# Patient Record
Sex: Female | Born: 1955 | Race: Black or African American | Hispanic: No | Marital: Married | Smoking: Never smoker
Health system: Southern US, Community
[De-identification: ages and names within clinical notes are randomized; demographics above are authoritative.]

## PROBLEM LIST (undated history)

## (undated) DIAGNOSIS — N186 End stage renal disease: Secondary | ICD-10-CM

## (undated) DIAGNOSIS — K219 Gastro-esophageal reflux disease without esophagitis: Secondary | ICD-10-CM

## (undated) DIAGNOSIS — I10 Essential (primary) hypertension: Secondary | ICD-10-CM

## (undated) DIAGNOSIS — D759 Disease of blood and blood-forming organs, unspecified: Secondary | ICD-10-CM

## (undated) DIAGNOSIS — Z993 Dependence on wheelchair: Secondary | ICD-10-CM

## (undated) DIAGNOSIS — M109 Gout, unspecified: Secondary | ICD-10-CM

## (undated) DIAGNOSIS — J45909 Unspecified asthma, uncomplicated: Secondary | ICD-10-CM

## (undated) DIAGNOSIS — Z9989 Dependence on other enabling machines and devices: Secondary | ICD-10-CM

## (undated) DIAGNOSIS — D631 Anemia in chronic kidney disease: Secondary | ICD-10-CM

## (undated) DIAGNOSIS — IMO0002 Reserved for concepts with insufficient information to code with codable children: Secondary | ICD-10-CM

## (undated) DIAGNOSIS — N185 Chronic kidney disease, stage 5: Secondary | ICD-10-CM

## (undated) DIAGNOSIS — M1990 Unspecified osteoarthritis, unspecified site: Secondary | ICD-10-CM

## (undated) DIAGNOSIS — M329 Systemic lupus erythematosus, unspecified: Secondary | ICD-10-CM

## (undated) DIAGNOSIS — N189 Chronic kidney disease, unspecified: Secondary | ICD-10-CM

## (undated) DIAGNOSIS — R0600 Dyspnea, unspecified: Secondary | ICD-10-CM

## (undated) DIAGNOSIS — D649 Anemia, unspecified: Secondary | ICD-10-CM

## (undated) DIAGNOSIS — G4730 Sleep apnea, unspecified: Secondary | ICD-10-CM

## (undated) DIAGNOSIS — J189 Pneumonia, unspecified organism: Secondary | ICD-10-CM

## (undated) DIAGNOSIS — N184 Chronic kidney disease, stage 4 (severe): Secondary | ICD-10-CM

## (undated) DIAGNOSIS — Z992 Dependence on renal dialysis: Secondary | ICD-10-CM

## (undated) DIAGNOSIS — G47419 Narcolepsy without cataplexy: Secondary | ICD-10-CM

## (undated) HISTORY — DX: Anemia in chronic kidney disease: D63.1

## (undated) HISTORY — PX: TUBAL LIGATION: SHX77

## (undated) HISTORY — PX: OTHER SURGICAL HISTORY: SHX169

## (undated) HISTORY — DX: Reserved for concepts with insufficient information to code with codable children: IMO0002

## (undated) HISTORY — PX: COLONOSCOPY: SHX174

## (undated) HISTORY — DX: Essential (primary) hypertension: I10

## (undated) HISTORY — DX: Chronic kidney disease, unspecified: N18.9

## (undated) HISTORY — DX: Chronic kidney disease, stage 4 (severe): N18.4

## (undated) HISTORY — DX: Systemic lupus erythematosus, unspecified: M32.9

---

## 2004-01-07 ENCOUNTER — Ambulatory Visit: Payer: Self-pay

## 2006-04-14 ENCOUNTER — Ambulatory Visit: Payer: Self-pay

## 2007-04-04 ENCOUNTER — Ambulatory Visit: Payer: Self-pay

## 2012-12-18 ENCOUNTER — Ambulatory Visit (INDEPENDENT_AMBULATORY_CARE_PROVIDER_SITE_OTHER): Payer: 59 | Admitting: Family Medicine

## 2012-12-18 VITALS — BP 110/68 | HR 79 | Temp 98.7°F | Resp 16 | Ht 63.0 in | Wt 196.0 lb

## 2012-12-18 DIAGNOSIS — R350 Frequency of micturition: Secondary | ICD-10-CM

## 2012-12-18 DIAGNOSIS — N390 Urinary tract infection, site not specified: Secondary | ICD-10-CM

## 2012-12-18 LAB — POCT URINALYSIS DIPSTICK
Bilirubin, UA: NEGATIVE
Glucose, UA: NEGATIVE
Ketones, UA: NEGATIVE
Spec Grav, UA: 1.01
Urobilinogen, UA: 0.2

## 2012-12-18 LAB — POCT UA - MICROSCOPIC ONLY
Crystals, Ur, HPF, POC: NEGATIVE
Mucus, UA: NEGATIVE
RBC, urine, microscopic: NEGATIVE

## 2012-12-18 MED ORDER — CIPROFLOXACIN HCL 500 MG PO TABS: 500.0000 mg | ORAL_TABLET | Freq: Two times a day (BID) | ORAL | Status: DC

## 2012-12-18 NOTE — Patient Instructions (Signed)
Urinary Tract Infection  Urinary tract infections (UTIs) can develop anywhere along your urinary tract. Your urinary tract is your body's drainage system for removing wastes and extra water. Your urinary tract includes two kidneys, two ureters, a bladder, and a urethra. Your kidneys are a pair of bean-shaped organs. Each kidney is about the size of your fist. They are located below your ribs, one on each side of your spine.  CAUSES  Infections are caused by microbes, which are microscopic organisms, including fungi, viruses, and bacteria. These organisms are so small that they can only be seen through a microscope. Bacteria are the microbes that most commonly cause UTIs.  SYMPTOMS   Symptoms of UTIs may vary by age and gender of the patient and by the location of the infection. Symptoms in young women typically include a frequent and intense urge to urinate and a painful, burning feeling in the bladder or urethra during urination. Older women and men are more likely to be tired, shaky, and weak and have muscle aches and abdominal pain. A fever may mean the infection is in your kidneys. Other symptoms of a kidney infection include pain in your back or sides below the ribs, nausea, and vomiting.  DIAGNOSIS  To diagnose a UTI, your caregiver will ask you about your symptoms. Your caregiver also will ask to provide a urine sample. The urine sample will be tested for bacteria and white blood cells. White blood cells are made by your body to help fight infection.  TREATMENT   Typically, UTIs can be treated with medication. Because most UTIs are caused by a bacterial infection, they usually can be treated with the use of antibiotics. The choice of antibiotic and length of treatment depend on your symptoms and the type of bacteria causing your infection.  HOME CARE INSTRUCTIONS   If you were prescribed antibiotics, take them exactly as your caregiver instructs you. Finish the medication even if you feel better after you  have only taken some of the medication.   Drink enough water and fluids to keep your urine clear or pale yellow.   Avoid caffeine, tea, and carbonated beverages. They tend to irritate your bladder.   Empty your bladder often. Avoid holding urine for long periods of time.   Empty your bladder before and after sexual intercourse.   After a bowel movement, women should cleanse from front to back. Use each tissue only once.  SEEK MEDICAL CARE IF:    You have back pain.   You develop a fever.   Your symptoms do not begin to resolve within 3 days.  SEEK IMMEDIATE MEDICAL CARE IF:    You have severe back pain or lower abdominal pain.   You develop chills.   You have nausea or vomiting.   You have continued burning or discomfort with urination.  MAKE SURE YOU:    Understand these instructions.   Will watch your condition.   Will get help right away if you are not doing well or get worse.  Document Released: 10/27/2004 Document Revised: 07/19/2011 Document Reviewed: 02/25/2011  [employer] Patient Information 2014 [employer]

## 2012-12-18 NOTE — Progress Notes (Signed)
Subjective:    Patient ID: ADESIRE SOOTER, female    DOB: 02-28-1955, 57 y.o.   MRN: [id number] This chart was scribed for Wardell Honour, MD by Drue Stager, ED Scribe. This patient was seen in room 11 and the patient's care was started at 6:20 PM . HPI HPI Comments: MALYKA WINNIE is a 57 y.o. female who presents to the [facility] complaining of urinary frequency onset three days ago. Pt states her urine is cloudy with associated pressure. Pt reports usually having an infection once to twice a year. Pt denies emesis or fever. Pt has a hx of lupus and is taking medications currently. Pt is a full time technologist with [employer]    There are no active problems to display for this patient.  Past Medical History  Diagnosis Date   Lupus    Past Surgical History  Procedure Laterality Date   Cesarean section     Tubal ligation     Allergies  Allergen Reactions   Erythromycin    Sulfa Antibiotics    Prior to Admission medications   Medication Sig Start Date End Date Taking? Authorizing Provider  albuterol (PROVENTIL HFA;VENTOLIN HFA) 108 (90 BASE) MCG/ACT inhaler Inhale into the lungs every 6 (six) hours as needed for wheezing or shortness of breath.   Yes Historical Provider, MD  budesonide-formoterol (SYMBICORT) 160-4.5 MCG/ACT inhaler Inhale 2 puffs into the lungs 2 (two) times daily.   Yes Historical Provider, MD  fluticasone (FLONASE) 50 MCG/ACT nasal spray Place into both nostrils daily.   Yes Historical Provider, MD  hydroxychloroquine (PLAQUENIL) 200 MG tablet Take by mouth daily.   Yes Historical Provider, MD  losartan-hydrochlorothiazide (HYZAAR) 100-12.5 MG per tablet Take 1 tablet by mouth daily.   Yes Historical Provider, MD  mycophenolate (CELLCEPT) 500 MG tablet Take by mouth 2 (two) times daily.   Yes Historical Provider, MD  predniSONE (DELTASONE) 5 MG tablet Take 5 mg by mouth daily with breakfast.   Yes Historical Provider,  MD   History   Social History   Marital Status: Married    Spouse Name: N/A    Number of Children: N/A   Years of Education: N/A   Occupational History   Not on file.   Social History Main Topics   Smoking status: Never Smoker    Smokeless tobacco: Not on file   Alcohol Use: Not on file   Drug Use: Not on file   Sexual Activity: Not on file   Other Topics Concern   Not on file   Social History Narrative   No narrative on file     Review of Systems  Constitutional: Negative for fever.  Gastrointestinal: Negative for vomiting.  Genitourinary: Positive for urgency and frequency.       Objective:   Physical Exam  Nursing note and vitals reviewed. Constitutional: She is oriented to person, place, and time. She appears well-developed and well-nourished.  HENT:  Head: Normocephalic.  Eyes: EOM are normal.  Neck: Normal range of motion.  Pulmonary/Chest: Effort normal.  Abdominal: She exhibits no distension.  Musculoskeletal: Normal range of motion.  Neurological: She is alert and oriented to person, place, and time.  Psychiatric: She has a normal mood and affect.   Filed Vitals:   12/18/12 1805  BP: 110/68  Pulse: 79  Temp: 98.7 F (37.1 C)  TempSrc: Oral  Resp: 16  Height: 5\' 3"  (1.6 m)  Weight: 196 lb (88.905 kg)  SpO2: 99%  No CVAT Results for orders placed in visit on 12/18/12  POCT UA - MICROSCOPIC ONLY      Result Value Range   WBC, Ur, HPF, POC tntc     RBC, urine, microscopic neg     Bacteria, U Microscopic 2+     Mucus, UA neg     Epithelial cells, urine per micros 2-3     Crystals, Ur, HPF, POC neg     Casts, Ur, LPF, POC neg     Yeast, UA neg    POCT URINALYSIS DIPSTICK      Result Value Range   Color, UA yellow     Clarity, UA cloudy'     Glucose, UA neg     Bilirubin, UA neg     Ketones, UA neg     Spec Grav, UA 1.010     Blood, UA trace     pH, UA 5.0     Protein, UA trace     Urobilinogen, UA 0.2     Nitrite,  UA neg     Leukocytes, UA large (3+)           Assessment & Plan:  6:24 PM- Discussed treatment plan with pt at bedside. Pt verbalized understanding and agreement with plan.  Urinary frequency - Plan: POCT UA - Microscopic Only, POCT urinalysis dipstick, ciprofloxacin (CIPRO) 500 MG tablet, Urine culture  Signed, Robyn Haber, MD

## 2012-12-21 LAB — URINE CULTURE: Colony Count: >=100000

## 2013-02-20 ENCOUNTER — Ambulatory Visit: Payer: Self-pay | Admitting: Internal Medicine

## 2014-11-03 ENCOUNTER — Other Ambulatory Visit: Payer: Self-pay | Admitting: Gastroenterology

## 2014-11-03 DIAGNOSIS — K921 Melena: Secondary | ICD-10-CM

## 2014-11-10 ENCOUNTER — Ambulatory Visit
Admission: RE | Admit: 2014-11-10 | Discharge: 2014-11-10 | Disposition: A | Discharge status: Home / Self Care | Payer: 59 | Source: Ambulatory Visit | Attending: Gastroenterology | Admitting: Gastroenterology

## 2014-11-10 DIAGNOSIS — K921 Melena: Secondary | ICD-10-CM

## 2014-11-10 MED ORDER — IOPAMIDOL (ISOVUE-300) INJECTION 61%
100.0000 mL | Freq: Once | INTRAVENOUS | Status: AC | PRN
Start: 2014-11-10 — End: 2014-11-10
  Administered 2014-11-10: 100 mL via INTRAVENOUS

## 2014-11-13 ENCOUNTER — Other Ambulatory Visit: Payer: Self-pay | Admitting: Gastroenterology

## 2014-11-13 DIAGNOSIS — K921 Melena: Secondary | ICD-10-CM

## 2014-11-28 ENCOUNTER — Other Ambulatory Visit: Payer: Self-pay | Admitting: Gastroenterology

## 2014-11-28 ENCOUNTER — Ambulatory Visit
Admission: RE | Admit: 2014-11-28 | Discharge: 2014-11-28 | Disposition: A | Discharge status: Home / Self Care | Payer: 59 | Source: Ambulatory Visit | Attending: Gastroenterology | Admitting: Gastroenterology

## 2014-11-28 DIAGNOSIS — K921 Melena: Secondary | ICD-10-CM

## 2014-11-28 MED ORDER — GADOXETATE DISODIUM 0.25 MMOL/ML IV SOLN: 9.0000 mL | Freq: Once | INTRAVENOUS | Status: DC | PRN

## 2015-04-17 ENCOUNTER — Other Ambulatory Visit: Payer: Self-pay | Admitting: Gastroenterology

## 2015-04-17 DIAGNOSIS — D649 Anemia, unspecified: Secondary | ICD-10-CM

## 2015-04-17 DIAGNOSIS — E611 Iron deficiency: Secondary | ICD-10-CM

## 2015-04-17 DIAGNOSIS — K921 Melena: Secondary | ICD-10-CM

## 2015-05-18 ENCOUNTER — Ambulatory Visit
Admission: RE | Admit: 2015-05-18 | Discharge: 2015-05-18 | Disposition: A | Discharge status: Home / Self Care | Payer: 59 | Source: Ambulatory Visit | Attending: Gastroenterology | Admitting: Gastroenterology

## 2015-05-18 DIAGNOSIS — D649 Anemia, unspecified: Secondary | ICD-10-CM

## 2015-05-18 DIAGNOSIS — E611 Iron deficiency: Secondary | ICD-10-CM

## 2015-05-18 DIAGNOSIS — K921 Melena: Secondary | ICD-10-CM

## 2015-11-11 ENCOUNTER — Other Ambulatory Visit (HOSPITAL_COMMUNITY)
Admission: RE | Admit: 2015-11-11 | Discharge: 2015-11-11 | Disposition: A | Discharge status: Home / Self Care | Payer: 59 | Source: Ambulatory Visit | Attending: Family Medicine | Admitting: Family Medicine

## 2015-11-11 ENCOUNTER — Other Ambulatory Visit: Payer: Self-pay | Admitting: Family Medicine

## 2015-11-11 DIAGNOSIS — Z01419 Encounter for gynecological examination (general) (routine) without abnormal findings: Secondary | ICD-10-CM | POA: Insufficient documentation

## 2015-11-12 LAB — CYTOLOGY - PAP

## 2016-06-09 ENCOUNTER — Other Ambulatory Visit: Payer: Self-pay | Admitting: Family Medicine

## 2016-06-09 DIAGNOSIS — N184 Chronic kidney disease, stage 4 (severe): Secondary | ICD-10-CM

## 2016-06-13 ENCOUNTER — Ambulatory Visit
Admission: RE | Admit: 2016-06-13 | Discharge: 2016-06-13 | Disposition: A | Discharge status: Home / Self Care | Payer: 59 | Source: Ambulatory Visit | Attending: Family Medicine | Admitting: Family Medicine

## 2016-06-13 DIAGNOSIS — N184 Chronic kidney disease, stage 4 (severe): Secondary | ICD-10-CM

## 2016-06-21 ENCOUNTER — Encounter: Payer: Self-pay | Admitting: Nephrology

## 2016-07-13 ENCOUNTER — Encounter (HOSPITAL_COMMUNITY): Payer: Self-pay | Admitting: *Deleted

## 2016-07-13 ENCOUNTER — Emergency Department (HOSPITAL_COMMUNITY): Payer: 59

## 2016-07-13 ENCOUNTER — Emergency Department (HOSPITAL_COMMUNITY)
Admission: EM | Admit: 2016-07-13 | Discharge: 2016-07-13 | Disposition: A | Discharge status: Home / Self Care | Payer: 59 | Attending: Emergency Medicine | Admitting: Emergency Medicine

## 2016-07-13 DIAGNOSIS — Z79899 Other long term (current) drug therapy: Secondary | ICD-10-CM | POA: Diagnosis not present

## 2016-07-13 DIAGNOSIS — D649 Anemia, unspecified: Secondary | ICD-10-CM | POA: Diagnosis not present

## 2016-07-13 DIAGNOSIS — N289 Disorder of kidney and ureter, unspecified: Secondary | ICD-10-CM

## 2016-07-13 DIAGNOSIS — R569 Unspecified convulsions: Secondary | ICD-10-CM

## 2016-07-13 DIAGNOSIS — R251 Tremor, unspecified: Secondary | ICD-10-CM | POA: Diagnosis present

## 2016-07-13 DIAGNOSIS — N189 Chronic kidney disease, unspecified: Secondary | ICD-10-CM | POA: Diagnosis not present

## 2016-07-13 DIAGNOSIS — G40209 Localization-related (focal) (partial) symptomatic epilepsy and epileptic syndromes with complex partial seizures, not intractable, without status epilepticus: Secondary | ICD-10-CM | POA: Insufficient documentation

## 2016-07-13 HISTORY — DX: Gout, unspecified: M10.9

## 2016-07-13 LAB — PROTIME-INR
INR: 0.98
PROTHROMBIN TIME: 12.9 s (ref 11.4–15.2)

## 2016-07-13 LAB — COMPREHENSIVE METABOLIC PANEL
ALBUMIN: 3.9 g/dL (ref 3.5–5.0)
ALK PHOS: 52 U/L (ref 38–126)
ALT: 20 U/L (ref 14–54)
ANION GAP: 10 (ref 5–15)
AST: 30 U/L (ref 15–41)
BUN: 50 mg/dL — ABNORMAL HIGH (ref 6–20)
CO2: 21 mmol/L — AB (ref 22–32)
Calcium: 9.6 mg/dL (ref 8.9–10.3)
Chloride: 106 mmol/L (ref 101–111)
Creatinine, Ser: 2.27 mg/dL — ABNORMAL HIGH (ref 0.44–1.00)
GFR calc non Af Amer: 22 mL/min — ABNORMAL LOW (ref >60)
GFR, EST AFRICAN AMERICAN: 26 mL/min — AB (ref >60)
GLUCOSE: 99 mg/dL (ref 65–99)
Potassium: 3.8 mmol/L (ref 3.5–5.1)
Sodium: 137 mmol/L (ref 135–145)
Total Bilirubin: 0.5 mg/dL (ref 0.3–1.2)
Total Protein: 7.2 g/dL (ref 6.5–8.1)

## 2016-07-13 LAB — DIFFERENTIAL
BASOS ABS: 0.1 10*3/uL (ref 0.0–0.1)
Basophils Relative: 1 %
Eosinophils Absolute: 0.3 10*3/uL (ref 0.0–0.7)
Eosinophils Relative: 4 %
LYMPHS ABS: 1.6 10*3/uL (ref 0.7–4.0)
Lymphocytes Relative: 26 %
MONO ABS: 0.4 10*3/uL (ref 0.1–1.0)
MONOS PCT: 7 %
NEUTROS ABS: 3.9 10*3/uL (ref 1.7–7.7)
NEUTROS PCT: 62 %

## 2016-07-13 LAB — CBC
HCT: 34.5 % — ABNORMAL LOW (ref 36.0–46.0)
HEMOGLOBIN: 10.8 g/dL — AB (ref 12.0–15.0)
MCH: 26.7 pg (ref 26.0–34.0)
MCHC: 31.3 g/dL (ref 30.0–36.0)
MCV: 85.4 fL (ref 78.0–100.0)
PLATELETS: 220 10*3/uL (ref 150–400)
RBC: 4.04 MIL/uL (ref 3.87–5.11)
RDW: 17.6 % — ABNORMAL HIGH (ref 11.5–15.5)
WBC: 6.3 10*3/uL (ref 4.0–10.5)

## 2016-07-13 LAB — APTT: APTT: 32 s (ref 24–36)

## 2016-07-13 LAB — I-STAT TROPONIN, ED: Troponin i, poc: 0 ng/mL (ref 0.00–0.08)

## 2016-07-13 MED ORDER — LEVETIRACETAM 500 MG PO TABS: 500.0000 mg | ORAL_TABLET | Freq: Two times a day (BID) | ORAL | 0 refills | Status: DC

## 2016-07-13 MED ORDER — LEVETIRACETAM 500 MG PO TABS
1000.0000 mg | ORAL_TABLET | Freq: Once | ORAL | Status: AC
  Administered 2016-07-13: 1000 mg via ORAL
  Filled 2016-07-13: qty 2

## 2016-07-13 NOTE — ED Triage Notes (Signed)
Pt to ED c/o intermittent L sided headache and tremors in the L arm onset this morning. Pt reports slurred speech at times today. No arm or leg drift at present, no tremors noted at present. Pt has seen PCP and sleep doctor for shaking of the head with no diagnosis.

## 2016-07-13 NOTE — ED Provider Notes (Signed)
[facility] DEPT Provider Note   CSN: [id number] Arrival date & time: 07/13/16  0056  By signing my name below, I, Mayer Masker, attest that this documentation has been prepared under the direction and in the presence of Delora Fuel, MD. Electronically Signed: Mayer Masker, Scribe. 07/13/16. 3:23 AM.  History   Chief Complaint Chief Complaint  Patient presents with  . Headache  . Tremors   The history is provided by the patient. No language interpreter was used.   HPI Comments: Colleen Obrien is a 61 y.o. female who presents to the Emergency Department complaining of intermittent, gradually worsening left-sided head and arm tremors that began 1.5 months ago. She states her tremors last 1 minute and her head turns side to side then resolve. She has associated sleep disturbances and headaches and notes her sleep disturbances have worsened over the past year. She states today her symptoms worsened this evening so she came to the ED. She notes her tremors are worse at night and they improve when she tries to calm herself down. She notes she has difficulty talking during her symptoms but thinks this is due to her head shaking and not due to the inability to come up with words. She denies weakness, numbness/tingling in her arms, tremors in her legs, and difficulty walking. She also denies any HA at this time. She has seen her PCP and sleep physician for this with no results.  Past Medical History:  Diagnosis Date  . Gout   . Lupus     There are no active problems to display for this patient.   Past Surgical History:  Procedure Laterality Date  . CESAREAN SECTION    . TUBAL LIGATION      OB History    No data available       Home Medications    Prior to Admission medications   Medication Sig Start Date End Date Taking? Authorizing Provider  albuterol (PROVENTIL HFA;VENTOLIN HFA) 108 (90 BASE) MCG/ACT inhaler Inhale into the lungs every 6 (six) hours as needed for  wheezing or shortness of breath.    [provider]  budesonide-formoterol (SYMBICORT) 160-4.5 MCG/ACT inhaler Inhale 2 puffs into the lungs 2 (two) times daily.    [provider]  ciprofloxacin (CIPRO) 500 MG tablet Take 1 tablet (500 mg total) by mouth 2 (two) times daily. 12/18/12   Robyn Haber, MD  fluticasone (FLONASE) 50 MCG/ACT nasal spray Place into both nostrils daily.    [provider]  hydroxychloroquine (PLAQUENIL) 200 MG tablet Take by mouth daily.    [provider]  losartan-hydrochlorothiazide (HYZAAR) 100-12.5 MG per tablet Take 1 tablet by mouth daily.    [provider]  mycophenolate (CELLCEPT) 500 MG tablet Take by mouth 2 (two) times daily.    [provider]  predniSONE (DELTASONE) 5 MG tablet Take 5 mg by mouth daily with breakfast.    [provider]    Family History Family History  Problem Relation Age of Onset  . Hypertension Mother   . Diabetes Mother   . Dementia Father   . Hypertension Father   . Hyperlipidemia Father   . Diabetes Paternal Grandmother   . Alcohol abuse Paternal Grandfather     Social History Social History  Substance Use Topics  . Smoking status: Never Smoker  . Smokeless tobacco: Never Used  . Alcohol use No     Allergies   Erythromycin and Sulfa antibiotics   Review of Systems Review of Systems  Musculoskeletal: Negative for gait problem.  Neurological: Positive for tremors and headaches. Negative for weakness and numbness.  Psychiatric/Behavioral: Positive for sleep disturbance.  All other systems reviewed and are negative.    Physical Exam Updated Vital Signs BP 129/86   Pulse 85   Temp 97.7 F (36.5 C) (Oral)   Resp 20   SpO2 97%   Physical Exam  Constitutional: She is oriented to person, place, and time. She appears well-developed and well-nourished.  HENT:  Head: Normocephalic and atraumatic.  Eyes: EOM are normal. Pupils are equal,  round, and reactive to light.  Neck: Normal range of motion. Neck supple. No JVD present.  Cardiovascular: Normal rate, regular rhythm and normal heart sounds.   No murmur heard. Pulmonary/Chest: Effort normal and breath sounds normal. She has no wheezes. She has no rales. She exhibits no tenderness.  Abdominal: Soft. Bowel sounds are normal. She exhibits no distension and no mass. There is no tenderness.  Musculoskeletal: Normal range of motion. She exhibits no edema.  Lymphadenopathy:    She has no cervical adenopathy.  Neurological: She is alert and oriented to person, place, and time. No cranial nerve deficit. She exhibits normal muscle tone. Coordination normal.  No pronator drift Motor strength 5/5 in all tested muscle groups  Skin: Skin is warm and dry. No rash noted.  Psychiatric: She has a normal mood and affect. Her behavior is normal. Judgment and thought content normal.  Nursing note and vitals reviewed.    ED Treatments / Results  DIAGNOSTIC STUDIES: Oxygen Saturation is 97% on RA, normal by my interpretation.    COORDINATION OF CARE: 3:21 AM Discussed treatment plan with pt at bedside and pt agreed to plan.  Labs (all labs ordered are listed, but only abnormal results are displayed) Labs Reviewed  CBC - Abnormal; Notable for the following:       Result Value   Hemoglobin 10.8 (*)    HCT 34.5 (*)    RDW 17.6 (*)    All other components within normal limits  COMPREHENSIVE METABOLIC PANEL - Abnormal; Notable for the following:    CO2 21 (*)    BUN 50 (*)    Creatinine, Ser 2.27 (*)    GFR calc non Af Amer 22 (*)    GFR calc Af Amer 26 (*)    All other components within normal limits  PROTIME-INR  APTT  DIFFERENTIAL  I-STAT TROPOININ, ED    Radiology Ct Head Wo Contrast  Result Date: 07/13/2016 CLINICAL DATA:  Tremor into the left arm with history of lupus EXAM: CT HEAD WITHOUT CONTRAST TECHNIQUE: Contiguous axial images were obtained from the base of the  skull through the vertex without intravenous contrast. COMPARISON:  None. FINDINGS: Brain: No evidence of acute infarction, hemorrhage, hydrocephalus, extra-axial collection or mass lesion/mass effect. Vascular: Minimal carotid artery calcification. No hyperdense vessels Skull: Normal. Negative for fracture or focal lesion. Sinuses/Orbits: Mild mucosal thickening in the sinuses. No acute orbital abnormality. Other: None IMPRESSION: No CT evidence for acute intracranial abnormality. Electronically Signed   By: Donavan Foil M.D.   On: 07/13/2016 01:52   Mr Brain Wo Contrast  Result Date: 07/13/2016 CLINICAL DATA:  Intermittent LEFT headache and LEFT arm tremor beginning this morning. Evaluate seizures. History of lupus. EXAM: MRI HEAD WITHOUT CONTRAST TECHNIQUE: Multiplanar, multiecho pulse sequences of the brain and surrounding structures were obtained without intravenous contrast. Patient was unable to receive contrast due to low GFR. COMPARISON:  CT HEAD July 13, 2016  at 0142 hours FINDINGS: Mild motion degraded examination. BRAIN: No reduced diffusion to suggest acute ischemia. No susceptibility artifact to suggest hemorrhage. The ventricles and sulci are normal for patient's age. A few punctate supratentorial white matter FLAIR T2 hyperintensities, less than expected for age. 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 No suspicious parenchymal signal, masses or mass effect. No abnormal extra-axial fluid collections. VASCULAR: Normal major intracranial vascular flow voids present at skull base. SKULL AND UPPER CERVICAL SPINE: No abnormal sellar expansion. No suspicious calvarial bone marrow signal. Craniocervical junction maintained. SINUSES/ORBITS: The mastoid air-cells and included paranasal sinuses are well-aerated. The included ocular globes and orbital contents are non-suspicious. OTHER: None. IMPRESSION: No acute intracranial process on this mildly motion degraded examination. 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  Minimal chronic small vessel ischemic disease. Electronically Signed   By: Elon Alas M.D.   On: 07/13/2016 05:32    Procedures Procedures (including critical care time)  Medications Ordered in ED Medications  levETIRAcetam (KEPPRA) tablet 1,000 mg (1,000 mg Oral Given 07/13/16 0350)     Initial Impression / Assessment and Plan / ED Course  I have reviewed the triage vital signs and the nursing notes.  Pertinent labs & imaging results that were available during my care of the patient were reviewed by me and considered in my medical decision making (see chart for details).  Episodes of head shaking with at least one episode showing involvement of the left arm. This is strongly suggestive of focal seizure with beginnings of [place] March. She is given initial dose of levetiracetam. CT had been ordered prior to my seeing her and was unremarkable. Screening labs showed mild anemia which is unchanged from baseline, and renal insufficiency which is also unchanged from baseline. She sent for MRI which shows evidence of tiny cerebellar infarcts, but no acute process. Otherwise she is discharged with prescription for levetiracetam and is referred to neurology for an EEG.  Final Clinical Impressions(s) / ED Diagnoses   Final diagnoses:  Focal seizure ([place])  Renal insufficiency  Normochromic normocytic anemia    New Prescriptions New Prescriptions   LEVETIRACETAM (KEPPRA) 500 MG TABLET    Take 1 tablet (500 mg total) by mouth 2 (two) times daily.   I personally performed the services described in this documentation, which was scribed in my presence. The recorded information has been reviewed and is accurate.        Delora Fuel, MD 94/58/59 [id number]

## 2016-07-13 NOTE — Discharge Instructions (Signed)
I believe the shaking episodes that you have been having are focal seizures. These are when small areas of the brain are involved in seizures rather than the whole brain. Please make an appointment with the neurologist for further outpatient workup.

## 2016-07-13 NOTE — ED Notes (Signed)
Patient transported to MRI 

## 2016-08-09 ENCOUNTER — Telehealth: Payer: Self-pay | Admitting: *Deleted

## 2016-08-09 NOTE — Telephone Encounter (Signed)
Pt Reed form on [employer].

## 2016-08-09 NOTE — Telephone Encounter (Signed)
She has new patient appointment pending on 08/23/16.  The form will be saved until she has established care with our office.

## 2016-08-23 ENCOUNTER — Encounter: Payer: Self-pay | Admitting: Neurology

## 2016-08-23 ENCOUNTER — Ambulatory Visit (INDEPENDENT_AMBULATORY_CARE_PROVIDER_SITE_OTHER): Payer: 59 | Admitting: Neurology

## 2016-08-23 DIAGNOSIS — G4719 Other hypersomnia: Secondary | ICD-10-CM | POA: Insufficient documentation

## 2016-08-23 DIAGNOSIS — G4733 Obstructive sleep apnea (adult) (pediatric): Secondary | ICD-10-CM | POA: Diagnosis not present

## 2016-08-23 DIAGNOSIS — R251 Tremor, unspecified: Secondary | ICD-10-CM

## 2016-08-23 MED ORDER — DIVALPROEX SODIUM ER 500 MG PO TB24: 500.0000 mg | ORAL_TABLET | Freq: Every day | ORAL | 11 refills | Status: DC

## 2016-08-23 NOTE — Progress Notes (Signed)
PATIENT: Colleen Obrien DOB: [birth year]  Chief Complaint  Patient presents with  . Seizures    She is here with her husband, Jeneen Rinks.  She presented to the ED on 07/13/16 with three episodes of seizure-like activity in the same day (head shaking, arm shaking, decreased focus lasting several minutes each).  Reports similar events prior to this date.    . Sleep Disturbance    She has difficulty getting to sleep and has vivid dreams causing her fatigue during the day.  She was just provided a prescription of modafinil 200mg  daily by her PCP but has not started the medication yet.  Marland Kitchen PCP    Carol Ada, MD - referred from ED.     HISTORICAL  Colleen Obrien is a 61 years old right-handed female, accompanied by her husband, seen in refer by her primary care physician Dr. Tamala Julian, [facility] for evaluation of seizure-like activity, sleep disturbance,  She is currently a patient of Dr. Maxwell Caul, carry a diagnosis of obstructive sleep apnea since 2008, for a while, CPAP machine has been very helpful, but she has been noncompliant with her CPAP machine for a while, since 2014, she had worsening daytime sleepiness, snoring at nighttime, frequent awakening, had repeat study, began to use CPAP on a daily basis since 2014, which seems to help her sleep overall until 2017  She also carried a diagnosis of lupus, she presented with multiple joint pain, swelling, in 1982, over the years, she was treated with high tapering dose of prednisone, currently maintained on Plaquenil 500 mg twice a day, CellCept 500 mg twice a day, she also suffered nephritis,  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, reading under microscope, since 2017, she noticed increased daytime sleepiness despite adequate hours in bed every night, she woke up feel un-refreshed, gradually worsened, sometimes she fell off sleep besides her microscope, if she sits as a passenger, as soon as her car was pulled out of the  driveway, she began falling to sleep,She also complains about vivid Jeneen Rinks, even while she was waking up from sleep, her dream seems to continue   She also described if she laugh heartily, she felt generalized weakness,  She began to experience seizure-like activity on July 13 2016, involuntary had shaking movement, without loss of consciousness, no she was taken to the emergency room,  I personally reviewed MRI of the brain on July 13 2016: No acute abnormality, 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  Laboratory evaluation showed elevated creatinine 2.27, estimated GFR 22, CBC showed hemoglobin of 10.8,  She was not able to go back to work since her emergency presentation on July 13 2016, despite resting at home, she continue complains of generalized weakness, fatigue, sleepiness, lack of stamina,  She was given a prescription of modafinil 200 mg by Dr. Maxwell Caul couple weeks ago, has not taken it yet  REVIEW OF SYSTEMS: Full 14 system review of systems performed and notable only for as above.  ALLERGIES: Allergies  Allergen Reactions  . Erythromycin   . Levofloxacin     Causes muscle pain   . Sulfa Antibiotics     HOME MEDICATIONS: Current Outpatient Prescriptions  Medication Sig Dispense Refill  . albuterol (PROVENTIL HFA;VENTOLIN HFA) 108 (90 BASE) MCG/ACT inhaler Inhale into the lungs every 6 (six) hours as needed for wheezing or shortness of breath.    . budesonide-formoterol (SYMBICORT) 160-4.5 MCG/ACT inhaler Inhale 2 puffs into the lungs 2 (two) times  daily.    . denosumab (PROLIA) 60 MG/ML SOLN injection Inject 60 mg into the skin every 6 (six) months. Administer in upper arm, thigh, or abdomen    . fluticasone (FLONASE) 50 MCG/ACT nasal spray Place into both nostrils daily.    . hydroxychloroquine (PLAQUENIL) 200 MG tablet Take by mouth daily.    Marland Kitchen levETIRAcetam (KEPPRA) 500 MG tablet Take 1 tablet (500 mg total) by mouth 2 (two) times  daily. 60 tablet 0  . losartan-hydrochlorothiazide (HYZAAR) 100-12.5 MG per tablet Take 1 tablet by mouth daily.    . modafinil (PROVIGIL) 200 MG tablet Take 200 mg by mouth daily.    . mycophenolate (CELLCEPT) 500 MG tablet Take by mouth 2 (two) times daily.    . Polyvinyl Alcohol-Povidone PF (REFRESH) 1.4-0.6 % SOLN Apply to eye daily.     No current facility-administered medications for this visit.     PAST MEDICAL HISTORY: Past Medical History:  Diagnosis Date  . Gout   . Hypertension   . Lupus     PAST SURGICAL HISTORY: Past Surgical History:  Procedure Laterality Date  . CESAREAN SECTION    . TUBAL LIGATION      FAMILY HISTORY: Family History  Problem Relation Age of Onset  . Hypertension Mother   . Diabetes Mother   . Dementia Father   . Hypertension Father   . Hyperlipidemia Father   . Diabetes Paternal Grandmother   . Alcohol abuse Paternal Grandfather     SOCIAL HISTORY:  Social History   Social History  . Marital status: Married    Spouse name: N/A  . Number of children: 2  . Years of education: College   Occupational History  . Cytotechnologists    Social History Main Topics  . Smoking status: Never Smoker  . Smokeless tobacco: Never Used  . Alcohol use No  . Drug use: No  . Sexual activity: Not on file   Other Topics Concern  . Not on file   Social History Narrative   Lives at home with her husband.   Right-handed.   2-3 cups caffeine daily.     PHYSICAL EXAM   Vitals:   08/23/16 1005  BP: 128/79  Pulse: 92  Weight: 199 lb (90.3 kg)  Height: 5\' 2"  (1.575 m)    Not recorded      Body mass index is 36.4 kg/m.  PHYSICAL EXAMNIATION:  Gen: NAD, conversant, well nourised, obese, well groomed                     Cardiovascular: Regular rate rhythm, no peripheral edema, warm, nontender. Eyes: Conjunctivae clear without exudates or hemorrhage Neck: Supple, no carotid bruits. Pulmonary: Clear to auscultation bilaterally    NEUROLOGICAL EXAM:  MENTAL STATUS: Speech:    Speech is normal; fluent and spontaneous with normal comprehension.  Cognition:     Orientation to time, place and person     Normal recent and remote memory     Normal Attention span and concentration     Normal Language, naming, repeating,spontaneous speech     Fund of knowledge   CRANIAL NERVES: CN II: Visual fields are full to confrontation. Fundoscopic exam is normal with sharp discs and no vascular changes. Pupils are round equal and briskly reactive to light. CN III, IV, VI: extraocular movement are normal. No ptosis. CN V: Facial sensation is intact to pinprick in all 3 divisions bilaterally. Corneal responses are intact.  CN VII: Face is symmetric  with normal eye closure and smile. CN VIII: Hearing is normal to rubbing fingers CN IX, X: Palate elevates symmetrically. Phonation is normal. CN XI: Head turning and shoulder shrug are intact CN XII: Tongue is midline with normal movements and no atrophy.  MOTOR: There is no pronator drift of out-stretched arms. Muscle bulk and tone are normal. Muscle strength is normal.  REFLEXES: Reflexes are 2+ and symmetric at the biceps, triceps, knees, and ankles. Plantar responses are flexor.  SENSORY: Intact to light touch, pinprick, positional sensation and vibratory sensation are intact in fingers and toes.  COORDINATION: Rapid alternating movements and fine finger movements are intact. There is no dysmetria on finger-to-nose and heel-knee-shin.    GAIT/STANCE: Posture is normal. Gait is steady with normal steps, base, arm swing, and turning. Heel and toe walking are normal. Tandem gait is normal.  Romberg is absent.   DIAGNOSTIC DATA (LABS, IMAGING, TESTING) - I reviewed patient records, labs, notes, testing and imaging myself where available.   ASSESSMENT AND PLAN  Derinda Bartus Obrien is a 61 y.o. female   Excessive daytime sleepiness  History of obstructive sleep  apnea, current change also suggestive of cataplexy  Modafinil 200 mg every morning  Seizure-like activity:  Depakote ER 500 mg every night  EEG  Lab   Marcial Pacas, M.D. Ph.D.  [facility] [address], [place],  [address] Ph: [phone] Fax: [phone]  HT:DSKAJ, Hal Hope, MD

## 2016-08-25 ENCOUNTER — Encounter: Payer: Self-pay | Admitting: *Deleted

## 2016-08-25 LAB — VITAMIN B12: Vitamin B-12: 702 pg/mL (ref 232–1245)

## 2016-08-25 LAB — ANA W/REFLEX: ANA: NEGATIVE

## 2016-08-25 LAB — CK: Total CK: 167 U/L (ref 24–173)

## 2016-08-25 LAB — C-REACTIVE PROTEIN: CRP: 2.9 mg/L (ref 0.0–4.9)

## 2016-08-25 LAB — COPPER, SERUM: COPPER: 112 ug/dL (ref 72–166)

## 2016-08-25 LAB — FERRITIN: FERRITIN: 57 ng/mL (ref 15–150)

## 2016-08-25 LAB — HGB A1C W/O EAG: HEMOGLOBIN A1C: 5.4 % (ref 4.8–5.6)

## 2016-08-25 LAB — RPR: RPR Ser Ql: NONREACTIVE

## 2016-08-25 LAB — VITAMIN D 25 HYDROXY (VIT D DEFICIENCY, FRACTURES): VIT D 25 HYDROXY: 22.4 ng/mL — AB (ref 30.0–100.0)

## 2016-08-25 LAB — TSH: TSH: 2.19 u[IU]/mL (ref 0.450–4.500)

## 2016-09-05 ENCOUNTER — Ambulatory Visit (INDEPENDENT_AMBULATORY_CARE_PROVIDER_SITE_OTHER): Payer: 59 | Admitting: Neurology

## 2016-09-05 DIAGNOSIS — R251 Tremor, unspecified: Secondary | ICD-10-CM

## 2016-09-05 DIAGNOSIS — G4719 Other hypersomnia: Secondary | ICD-10-CM

## 2016-09-05 DIAGNOSIS — G4733 Obstructive sleep apnea (adult) (pediatric): Secondary | ICD-10-CM

## 2016-09-08 ENCOUNTER — Telehealth: Payer: Self-pay | Admitting: Neurology

## 2016-09-08 NOTE — Telephone Encounter (Signed)
Please call patient EEG was normal, I will refer her to sleep study, please put her on schedule for Dr. Brett Fairy or Dr. Rexene Alberts as soon as possible,  Also check to see how she tolerated Depakote

## 2016-09-08 NOTE — Telephone Encounter (Signed)
Pt called the office requesting EEG results. She also wants to know how long she will stay on divalproex (DEPAKOTE ER) 500 MG 24 hr tablet. She is having vivid dreams, talking in her sleep. Please call

## 2016-09-08 NOTE — Telephone Encounter (Signed)
Spoke to pt and she has been taking the depakote er 500mg  daily.  She has noted a slight tremor in her mouth previously which she says has worsened.  Also is having vivid dreams again since starting the depakote.  Please advise.  Had EEG done on Monday, inquiring of results too.

## 2016-09-08 NOTE — Procedures (Signed)
   HISTORY: 61 years old presented with seizure-like episode  TECHNIQUE:  16 channel EEG was performed based on standard 10-16 international system. One channel was dedicated to EKG, which has demonstrates normal sinus rhythm of 84 beats per minutes.  Upon awakening, the posterior background activity was well-developed, in alpha range, 8 Hz, reactive to eye opening and closure.  There was no evidence of epileptiform discharge. There was frequent electrode artifact  Photic stimulation was performed, which induced a symmetric photic driving.  Hyperventilation was not performed, there was no abnormality elicit.  No sleep was achieved.  CONCLUSION: This is a  normal awake EEG.  There is no electrodiagnostic evidence of epileptiform discharge.  Marcial Pacas, M.D. Ph.D.  [facility] [place], [place] [address] Phone: [phone] Fax:      [phone]

## 2016-09-09 NOTE — Telephone Encounter (Signed)
Pt returned RN's call °

## 2016-09-09 NOTE — Telephone Encounter (Signed)
Spoke to pt and she is currently on cpap for osa and modafinil for hypersomnia.  Pt states that she is taking modafinil and then will have to sleep for one hour and then is ok after this.  I told since depakote not helping to stop.  We will get records from Dr. Maxwell Caul to review. (hold ss).   May move up appt once reviewed notes from Dr. Maxwell Caul.  Pt verbalized understanding. I lmvm for Lauren at [facility] [phone] to fax sleep notes to [name] at [phone].  She was not in today. So this may happen on Monday.  She is to call if questions (lauren).

## 2016-09-09 NOTE — Telephone Encounter (Signed)
LMVM for pt that returning call for results and plan of care.  Per Dr. Krista Blue ok to stop depakote.  Needs sleep study.

## 2016-09-12 ENCOUNTER — Telehealth: Payer: Self-pay | Admitting: *Deleted

## 2016-09-12 NOTE — Telephone Encounter (Signed)
-----   Message from Marcial Pacas, MD sent at 09/09/2016  9:06 AM EDT ----- Please call patient for normal eeg

## 2016-09-12 NOTE — Telephone Encounter (Signed)
Received ofv note from Dr. Nancee Liter office.

## 2016-09-12 NOTE — Telephone Encounter (Signed)
I spoke to pt and relayed that her EEG was normal study.  (no seizures).   She verbalized understanding.

## 2016-09-12 NOTE — Telephone Encounter (Addendum)
I gave EEG results to pt. (normal study).  I told her that I had contacted, LMVM for Lauren at Dr. Nancee Liter office last week and again this am for notes/ sleep to fax to [name] (gave her differenct fax #)/  .  I told her that since he did not refer to [name], she may need to call and LM as well for [name] to get records.  She stated she would.

## 2016-09-19 NOTE — Telephone Encounter (Signed)
I was able to review sleep study report from Dr. Maxwell Caul, she had moderate obstructive sleep apnea, not compliant with her CPAP machine, complains of poor sleep quality, excessive daytime sleepiness, fatigue,

## 2016-11-01 ENCOUNTER — Ambulatory Visit: Payer: 59 | Admitting: Neurology

## 2016-11-01 ENCOUNTER — Telehealth: Payer: Self-pay | Admitting: *Deleted

## 2016-11-01 NOTE — Telephone Encounter (Signed)
No showed follow up appointment. 

## 2016-11-02 ENCOUNTER — Encounter: Payer: Self-pay | Admitting: Neurology

## 2016-11-08 ENCOUNTER — Encounter (HOSPITAL_BASED_OUTPATIENT_CLINIC_OR_DEPARTMENT_OTHER): Payer: Self-pay

## 2016-11-08 DIAGNOSIS — G4719 Other hypersomnia: Secondary | ICD-10-CM

## 2016-12-05 ENCOUNTER — Ambulatory Visit (HOSPITAL_BASED_OUTPATIENT_CLINIC_OR_DEPARTMENT_OTHER): Payer: 59 | Admitting: Internal Medicine

## 2016-12-05 VITALS — Ht 62.0 in | Wt 199.0 lb

## 2016-12-06 ENCOUNTER — Encounter (HOSPITAL_BASED_OUTPATIENT_CLINIC_OR_DEPARTMENT_OTHER): Payer: 59

## 2016-12-27 ENCOUNTER — Ambulatory Visit (HOSPITAL_BASED_OUTPATIENT_CLINIC_OR_DEPARTMENT_OTHER): Payer: 59 | Attending: Internal Medicine | Admitting: Internal Medicine

## 2016-12-27 DIAGNOSIS — G4733 Obstructive sleep apnea (adult) (pediatric): Secondary | ICD-10-CM | POA: Insufficient documentation

## 2016-12-27 DIAGNOSIS — G4719 Other hypersomnia: Secondary | ICD-10-CM

## 2016-12-27 DIAGNOSIS — G4710 Hypersomnia, unspecified: Secondary | ICD-10-CM | POA: Insufficient documentation

## 2016-12-28 ENCOUNTER — Ambulatory Visit (HOSPITAL_BASED_OUTPATIENT_CLINIC_OR_DEPARTMENT_OTHER): Payer: 59 | Attending: Internal Medicine | Admitting: Internal Medicine

## 2016-12-28 DIAGNOSIS — G47419 Narcolepsy without cataplexy: Secondary | ICD-10-CM | POA: Insufficient documentation

## 2016-12-28 DIAGNOSIS — G4719 Other hypersomnia: Secondary | ICD-10-CM | POA: Diagnosis not present

## 2016-12-29 ENCOUNTER — Encounter (HOSPITAL_BASED_OUTPATIENT_CLINIC_OR_DEPARTMENT_OTHER): Payer: Self-pay | Admitting: *Deleted

## 2016-12-29 ENCOUNTER — Encounter (HOSPITAL_BASED_OUTPATIENT_CLINIC_OR_DEPARTMENT_OTHER): Payer: Self-pay | Admitting: Internal Medicine

## 2017-01-01 NOTE — Procedures (Signed)
   NAME: Colleen Obrien DATE OF BIRTH:  October 30, 1955 MEDICAL RECORD NUMBER [id number]  LOCATION: [facility]  PHYSICIAN: Marius Ditch  DATE OF STUDY: 12/27/2016  SLEEP STUDY TYPE: Nocturnal Polysomnogram               REFERRING PHYSICIAN: Marius Ditch, MD  INDICATION FOR STUDY: excessive daytime sleepiness despite good use of CPAP for OSA; sleep paralysis, sleep related hallucinations, and possible cataplexy  EPWORTH SLEEPINESS SCORE:  12 HEIGHT:   62.5 inches WEIGHT:     198 lbs; BMI 35.6  NECK SIZE: 15 in.  MEDICATIONS Patient self administered medications include: N/A. Medications administered during study include No sleep medicine administered. Patient's med list in eCW was reviewed prior to the study. She held her modafanil per our instructions.   SLEEP STUDY TECHNIQUE The patient underwent an attended overnight polysomnography titration to assess the effects of cpap therapy. The following variables were monitored: EEG (C4-A1, C3-A2, O1-A2, O2-A1, F3-M2, F4-M1), EOG, submental and leg EMG, ECG, oxyhemoglobin saturation by pulse oximetry, thoracic and abdominal respiratory effort belts, nasal/oral airflow by pressure sensor, body position sensor and snoring sensor. CPAP pressure was titrated to eliminate apneas, hypopneas and oxygen desaturation.  TECHNICAL COMMENTS Comments added by Technician: PT HAD TO RESTROOM VISTED. Patient talked in his/her sleep. Patient had difficulty initiating sleep. Patient was restless all through the night.  Comments added by Scorer: N/A  SLEEP ARCHITECTURE The study was initiated at 10:52:36 PM and terminated at 5:59:20 AM. Total recorded time was 426.7 minutes. EEG confirmed total sleep time was 231.5 minutes yielding a sleep efficiency of 54.2%. Sleep onset after lights out was 4.4 minutes with a REM latency of 206.0 minutes. The patient spent 12.96% of the night in stage N1 sleep, 57.02% in stage N2 sleep, 13.61%  in stage N3 and 16.41% in REM. The Arousal Index was 25.7/hour.  RESPIRATORY PARAMETERS The overall AHI was 1.8 per hour, and the RDI was 5.7 events/hour with a central apnea index of 0.0 per hour. She was on APAP for much of the night and was changed to BPAP. The oxygen nadir was 83.00% during sleep.  LEG MOVEMENT DATA The total leg movements were with a resulting leg movement index of 0.3/hr. Associated arousal with leg movement index was 0.3.  CARDIAC DATA The underlying cardiac rhythm was most consistent with sinus rhythm. Mean heart rate during sleep was 77.20 bpm. Additional rhythm abnormalities include None.  IMPRESSIONS - Adequate control of OSA on PAP. - May proceed with MSLT  DIAGNOSIS - Obstructive Sleep Apnea (327.23 [G47.33 ICD-10]) - RECOMMENDATIONS - May proceed with MSLT  Marius Ditch Sleep specialist, American Board of Sleep Medicine  ELECTRONICALLY SIGNED ON:  01/01/2017, 8:10 PM [place] PH: [phone]   FX: [phone] [place]

## 2017-01-01 NOTE — Procedures (Signed)
    NAME: Colleen Obrien DATE OF BIRTH:  [birth year] MEDICAL RECORD NUMBER [id number]  LOCATION: [facility]  PHYSICIAN: Marius Ditch  DATE OF STUDY: 12/28/2016  SLEEP STUDY TYPE: Multiple Sleep Latency Test               REFERRING PHYSICIAN: Marius Ditch, MD  INDICATION FOR STUDY: excessive daytime sleepiness despite use of CPAP.   EPWORTH SLEEPINESS SCORE:  12 HEIGHT: 5\' 2"  (157.5 cm)  WEIGHT: 199 lb (90.3 kg)    Body mass index is 36.4 kg/m.  NECK SIZE: 15 in.  MEDICATIONS Patient self administered medications include: N/A. Medications administered during study include No sleep medicine administered.Marland Kitchen  SLEEP STUDY TECHNIQUE A multiple sleep latency test was performed. The channels recorded and monitored were central and occipital EEG, electrooculogram (EOG), submentalis EMG (chin), and electrocardiogram.  TECHNICAL COMMENTS Comments added by Technician: HYPERSOMNIA  Comments added by Scorer: N/A  IMPRESSIONS - Total number of naps attempted: 5.00 . Total number of naps with sleep attained: 5 - Pathologic sleepiness was evidenced by short mean sleep latency of 1:15 minutes - 4/5 naps had REM periods  DIAGNOSIS - Narcolepsy  RECOMMENDATIONS - Return for follow up and management of Narcolepsy   Marius Ditch Sleep specialist, American Board of Sleep Medicine  ELECTRONICALLY SIGNED ON:  01/01/2017, 8:24 PM [place] PH: [phone]   FX: [phone]) [place]

## 2017-01-09 ENCOUNTER — Ambulatory Visit: Payer: Self-pay | Admitting: Neurology

## 2017-03-02 ENCOUNTER — Ambulatory Visit: Payer: Managed Care, Other (non HMO) | Admitting: Neurology

## 2017-03-02 ENCOUNTER — Encounter: Payer: Self-pay | Admitting: Neurology

## 2017-03-02 VITALS — BP 135/82 | HR 102 | Ht 62.0 in | Wt 198.0 lb

## 2017-03-02 DIAGNOSIS — R251 Tremor, unspecified: Secondary | ICD-10-CM | POA: Diagnosis not present

## 2017-03-02 DIAGNOSIS — G47411 Narcolepsy with cataplexy: Secondary | ICD-10-CM | POA: Diagnosis not present

## 2017-03-02 NOTE — Progress Notes (Signed)
PATIENT: Colleen Obrien DOB: [birth year]  Chief Complaint  Patient presents with  . Seizures    She is no longer taking Depakote ER because she felt it made her symptoms worse.  She is still having intermittent body twitches.  . Narcolepsy with Cataplexy    She was started on modafinil but it took too long to work.  She is now on immediate release Adderall and doing much better.     HISTORICAL  Colleen Obrien is a 62 years old right-handed female, accompanied by her husband, seen in refer by her primary care physician Dr. Tamala Julian, [facility] for evaluation of seizure-like activity, sleep disturbance, initial evaluation was on August 23, 2016.  She is currently a patient of Dr. Maxwell Caul, carry a diagnosis of obstructive sleep apnea since 2008, for a while, CPAP machine has been very helpful, but she has been noncompliant with her CPAP machine, she had worsening daytime sleepiness, snoring at nighttime, frequent awakening, had repeat study, began to use CPAP on a daily basis again since 2014, which seems to help her sleep overall until 2017  She also carried a diagnosis of lupus, she presented with multiple joint pain, swelling, in 1982, over the years, she was treated with high tapering dose of prednisone, currently maintained on Plaquenil 500 mg twice a day, CellCept 500 mg twice a day, she also suffered nephritis,  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, reading under microscope, since 2017, she noticed increased daytime sleepiness despite adequate hours in bed every night, she woke up feel un-refreshed, gradually worsened, sometimes she fell off sleep besides her microscope, if she sits as a passenger, as soon as her car was pulled out of the driveway, she began falling to sleep,She also complains about vivid Jeneen Rinks, even while she was waking up from sleep, her dream seems to continue   She also described if she laugh heartily, she felt generalized weakness,  She  began to experience seizure-like activity on July 13 2016, involuntary had shaking movement, without loss of consciousness, she was taken to the emergency room,  I personally reviewed MRI of the brain on July 13 2016: No acute abnormality, 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  Laboratory evaluation showed elevated creatinine 2.27, estimated GFR 22, CBC showed hemoglobin of 10.8,  She was not able to go back to work since her emergency presentation on July 13 2016, despite resting at home, she continue complains of generalized weakness, fatigue, sleepiness, lack of stamina,  She was given a prescription of modafinil 200 mg by Dr. Maxwell Caul couple weeks ago, has not taken it yet  UPDATE Mar 02 2017: I was able to review sleep study report from Dr. Maxwell Caul, she had moderate obstructive sleep apnea, not compliant with her CPAP machine, complains of poor sleep quality, excessive daytime sleepiness, fatigue,  She had repeat sleep study in Dec 2018, showed evidence of narcolepsy and cataplexy, she is now given prescription of immediate release Adderall, 5 mg 2 in the morning, 1 tablets at 3 PM as needed.  She is no longer taking Depakote, did not help.  REVIEW OF SYSTEMS: Full 14 system review of systems performed and notable only for as above.  ALLERGIES: Allergies  Allergen Reactions  . Erythromycin   . Levofloxacin     Causes muscle pain   . Sulfa Antibiotics     HOME MEDICATIONS: Current Outpatient Medications  Medication Sig Dispense Refill  . albuterol (PROVENTIL HFA;VENTOLIN HFA)  108 (90 BASE) MCG/ACT inhaler Inhale into the lungs every 6 (six) hours as needed for wheezing or shortness of breath.    . amphetamine-dextroamphetamine (ADDERALL) 5 MG tablet 2 TABLETS EVERY MORNING AND ONE IN THE AFTERNOON IF NEEDED TWICE A DAY ORALLY 30 DAYS  0  . budesonide-formoterol (SYMBICORT) 160-4.5 MCG/ACT inhaler Inhale 2 puffs into the lungs 2 (two) times  daily.    . Cholecalciferol (VITAMIN D3 PO) Take 2,000 Units by mouth daily.    Marland Kitchen denosumab (PROLIA) 60 MG/ML SOLN injection Inject 60 mg into the skin every 6 (six) months. Administer in upper arm, thigh, or abdomen    . fluticasone (FLONASE) 50 MCG/ACT nasal spray Place into both nostrils daily.    . hydroxychloroquine (PLAQUENIL) 200 MG tablet Take by mouth daily.    Marland Kitchen losartan-hydrochlorothiazide (HYZAAR) 100-12.5 MG per tablet Take 1 tablet by mouth daily.    . mycophenolate (CELLCEPT) 500 MG tablet Take by mouth 2 (two) times daily.    . Polyvinyl Alcohol-Povidone PF (REFRESH) 1.4-0.6 % SOLN Apply to eye daily.     No current facility-administered medications for this visit.     PAST MEDICAL HISTORY: Past Medical History:  Diagnosis Date  . Gout   . Hypertension   . Lupus     PAST SURGICAL HISTORY: Past Surgical History:  Procedure Laterality Date  . CESAREAN SECTION    . TUBAL LIGATION      FAMILY HISTORY: Family History  Problem Relation Age of Onset  . Hypertension Mother   . Diabetes Mother   . Dementia Father   . Hypertension Father   . Hyperlipidemia Father   . Diabetes Paternal Grandmother   . Alcohol abuse Paternal Grandfather     SOCIAL HISTORY:  Social History   Socioeconomic History  . Marital status: Married    Spouse name: Not on file  . Number of children: 2  . Years of education: College  . Highest education level: Not on file  Social Needs  . Financial resource strain: Not on file  . Food insecurity - worry: Not on file  . Food insecurity - inability: Not on file  . Transportation needs - medical: Not on file  . Transportation needs - non-medical: Not on file  Occupational History  . Occupation: Cytotechnologists  Tobacco Use  . Smoking status: Never Smoker  . Smokeless tobacco: Never Used  Substance and Sexual Activity  . Alcohol use: No  . Drug use: No  . Sexual activity: Not on file  Other Topics Concern  . Not on file  Social  History Narrative   Lives at home with her husband.   Right-handed.   2-3 cups caffeine daily.     PHYSICAL EXAM   Vitals:   03/02/17 0925  BP: 135/82  Pulse: (!) 102  Weight: 198 lb (89.8 kg)  Height: 5\' 2"  (1.575 m)    Not recorded      Body mass index is 36.21 kg/m.  PHYSICAL EXAMNIATION:  Gen: NAD, conversant, well nourised, obese, well groomed                     Cardiovascular: Regular rate rhythm, no peripheral edema, warm, nontender. Eyes: Conjunctivae clear without exudates or hemorrhage Neck: Supple, no carotid bruits. Pulmonary: Clear to auscultation bilaterally   NEUROLOGICAL EXAM:  MENTAL STATUS: Speech:    Speech is normal; fluent and spontaneous with normal comprehension.  Cognition:     Orientation to time, place and person  Normal recent and remote memory     Normal Attention span and concentration     Normal Language, naming, repeating,spontaneous speech     Fund of knowledge   CRANIAL NERVES: CN II: Visual fields are full to confrontation. Fundoscopic exam is normal with sharp discs and no vascular changes. Pupils are round equal and briskly reactive to light. CN III, IV, VI: extraocular movement are normal. No ptosis. CN V: Facial sensation is intact to pinprick in all 3 divisions bilaterally. Corneal responses are intact.  CN VII: Face is symmetric with normal eye closure and smile. CN VIII: Hearing is normal to rubbing fingers CN IX, X: Palate elevates symmetrically. Phonation is normal. CN XI: Head turning and shoulder shrug are intact CN XII: Tongue is midline with normal movements and no atrophy.  MOTOR: There is no pronator drift of out-stretched arms. Muscle bulk and tone are normal. Muscle strength is normal.  REFLEXES: Reflexes are 2+ and symmetric at the biceps, triceps, knees, and ankles. Plantar responses are flexor.  SENSORY: Intact to light touch, pinprick, positional sensation and vibratory sensation are intact in  fingers and toes.  COORDINATION: Rapid alternating movements and fine finger movements are intact. There is no dysmetria on finger-to-nose and heel-knee-shin.    GAIT/STANCE: Posture is normal. Gait is steady with normal steps, base, arm swing, and turning. Heel and toe walking are normal. Tandem gait is normal.  Romberg is absent.   DIAGNOSTIC DATA (LABS, IMAGING, TESTING) - I reviewed patient records, labs, notes, testing and imaging myself where available.   ASSESSMENT AND PLAN  Narcolepsy with cataplexy  Is under the management of Dr. Elenore Rota  Responding well to Adderall 5mg  2 tabs in the morning  Continue follow-up with Dr. Elenore Rota New onset tremor in a stressful situation  Most consistent with exaggerated physiological tremor  No evidence of seizure  Depakote did not help, has stopped  Marcial Pacas, M.D. Ph.D.  [facility] [address] [place], [place] [address] Ph: [phone] Fax: [phone]  EO:[id number], Hal Hope, MD

## 2017-05-09 ENCOUNTER — Other Ambulatory Visit: Payer: Self-pay

## 2017-05-09 ENCOUNTER — Emergency Department (HOSPITAL_COMMUNITY)
Admission: EM | Admit: 2017-05-09 | Discharge: 2017-05-09 | Disposition: A | Discharge status: Home / Self Care | Payer: Managed Care, Other (non HMO) | Attending: Emergency Medicine | Admitting: Emergency Medicine

## 2017-05-09 ENCOUNTER — Encounter (HOSPITAL_COMMUNITY): Payer: Self-pay | Admitting: Emergency Medicine

## 2017-05-09 DIAGNOSIS — J45909 Unspecified asthma, uncomplicated: Secondary | ICD-10-CM | POA: Diagnosis not present

## 2017-05-09 DIAGNOSIS — I10 Essential (primary) hypertension: Secondary | ICD-10-CM | POA: Diagnosis not present

## 2017-05-09 DIAGNOSIS — Z79899 Other long term (current) drug therapy: Secondary | ICD-10-CM | POA: Diagnosis not present

## 2017-05-09 DIAGNOSIS — F419 Anxiety disorder, unspecified: Secondary | ICD-10-CM | POA: Insufficient documentation

## 2017-05-09 DIAGNOSIS — R002 Palpitations: Secondary | ICD-10-CM | POA: Diagnosis present

## 2017-05-09 DIAGNOSIS — R030 Elevated blood-pressure reading, without diagnosis of hypertension: Secondary | ICD-10-CM

## 2017-05-09 HISTORY — DX: Sleep apnea, unspecified: G47.30

## 2017-05-09 HISTORY — DX: Unspecified asthma, uncomplicated: J45.909

## 2017-05-09 HISTORY — DX: Narcolepsy without cataplexy: G47.419

## 2017-05-09 LAB — BASIC METABOLIC PANEL
ANION GAP: 11 (ref 5–15)
BUN: 33 mg/dL — ABNORMAL HIGH (ref 6–20)
CHLORIDE: 105 mmol/L (ref 101–111)
CO2: 23 mmol/L (ref 22–32)
CREATININE: 2.22 mg/dL — AB (ref 0.44–1.00)
Calcium: 9.7 mg/dL (ref 8.9–10.3)
GFR calc non Af Amer: 23 mL/min — ABNORMAL LOW (ref >60)
GFR, EST AFRICAN AMERICAN: 26 mL/min — AB (ref >60)
Glucose, Bld: 92 mg/dL (ref 65–99)
Potassium: 3.6 mmol/L (ref 3.5–5.1)
SODIUM: 139 mmol/L (ref 135–145)

## 2017-05-09 LAB — CBC WITH DIFFERENTIAL/PLATELET
BASOS PCT: 0 %
Basophils Absolute: 0 10*3/uL (ref 0.0–0.1)
EOS ABS: 0.2 10*3/uL (ref 0.0–0.7)
Eosinophils Relative: 3 %
HCT: 37.5 % (ref 36.0–46.0)
HEMOGLOBIN: 11.3 g/dL — AB (ref 12.0–15.0)
LYMPHS ABS: 0.7 10*3/uL (ref 0.7–4.0)
Lymphocytes Relative: 14 %
MCH: 26.7 pg (ref 26.0–34.0)
MCHC: 30.1 g/dL (ref 30.0–36.0)
MCV: 88.4 fL (ref 78.0–100.0)
Monocytes Absolute: 0.5 10*3/uL (ref 0.1–1.0)
Monocytes Relative: 11 %
NEUTROS PCT: 72 %
Neutro Abs: 3.5 10*3/uL (ref 1.7–7.7)
Platelets: 276 10*3/uL (ref 150–400)
RBC: 4.24 MIL/uL (ref 3.87–5.11)
RDW: 14.7 % (ref 11.5–15.5)
WBC: 4.9 10*3/uL (ref 4.0–10.5)

## 2017-05-09 MED ORDER — HYDROXYZINE HCL 25 MG PO TABS: 25.0000 mg | ORAL_TABLET | Freq: Four times a day (QID) | ORAL | 0 refills | Status: DC | PRN

## 2017-05-09 NOTE — ED Notes (Signed)
Patient ambulatory to the room, getting dressed into gown at this time

## 2017-05-09 NOTE — Discharge Instructions (Signed)
Please follow with your primary care doctor in the next 2 days for a check-up. They must obtain records for further management.  ° °Do not hesitate to return to the Emergency Department for any new, worsening or concerning symptoms.  ° °

## 2017-05-09 NOTE — ED Triage Notes (Signed)
Pt takes losartan and was told by her MD to come off the medication for a few weeks since there was a recall on the medication. Pt went back on it after her BP increasingly getting higher. Pt has been on it for a week and a half now. Today her BP at work was 204/117. Denies headache or any other symptoms at this time.

## 2017-05-09 NOTE — ED Provider Notes (Signed)
Patient placed in Quick Look pathway, seen and evaluated   Chief Complaint: high blood pressure  HPI:   Pt with elevated bp at home yesterday and today at work. BP 204/117 today. Restarted on losartan 1 week ago. Asymptomatic. Currently on antibiotic for sinus infection. Also feels heart beat in right neck.   ROS:   Physical Exam:   Gen: No distress  Neuro: Awake and Alert  Skin: Warm    Focused Exam: pt in NAD, tachycardic, regular HR and rhythm. Lungs clear.   Pt is tachycardic, hypertensive, no complaints other than feeling "heart beat" in right neck. Will get ecg and basic labs.    Initiation of care has begun. The patient has been counseled on the process, plan, and necessity for staying for the completion/evaluation, and the remainder of the medical screening examination    Colleen Senior, PA-C 05/09/17 [place], DO 05/09/17 2002

## 2017-05-09 NOTE — ED Notes (Signed)
ED Provider at bedside. 

## 2017-05-09 NOTE — ED Provider Notes (Signed)
[facility] EMERGENCY DEPARTMENT Provider Note   CSN: [id number] Arrival date & time: 05/09/17  1323     History   Chief Complaint Chief Complaint  Patient presents with  . Hypertension   HPI   Blood pressure (!) 187/111, pulse (!) 101, temperature 98 F (36.7 C), temperature source Oral, resp. rate 16, height 5\' 2"  (1.575 m), weight 88.9 kg (196 lb), SpO2 100 %.  Colleen Obrien is a 61 y.o. female complaining of elevated blood pressure worsening over the course of the day, she states that she checked initially this morning it was 191 systolic.  She had it rechecked at work where the nurse stated it was 478G systolic.  She is been extremely stressed at work, she denies any headache, change in vision, nausea vomiting, dysarthria, ataxia, chest pain, abdominal pain, nausea vomiting diarrhea.  She recently restarted her losartan 1 week ago she is been compliant with this.  She is noticed that there is a pulsation and a heartbeat on the right side of her neck.  She denies any orthopnea, PND, shortness of breath.  She has a cough at her baseline.  Past Medical History:  Diagnosis Date  . Asthma   . Gout   . Hypertension   . Lupus ([facility])   . Narcolepsy   . Sleep apnea     Patient Active Problem List   Diagnosis Date Noted  . 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  . Tremor 03/02/2017  . Shaking 08/23/2016  . Excessive daytime sleepiness 08/23/2016  . Obstructive sleep apnea 08/23/2016    Past Surgical History:  Procedure Laterality Date  . CESAREAN SECTION    . TUBAL LIGATION       OB History   None      Home Medications    Prior to Admission medications   Medication Sig Start Date End Date Taking? Authorizing Provider  albuterol (PROVENTIL HFA;VENTOLIN HFA) 108 (90 BASE) MCG/ACT inhaler Inhale into the lungs every 6 (six) hours as needed for wheezing or shortness of breath.    [provider]  amphetamine-dextroamphetamine  (ADDERALL) 5 MG tablet 2 TABLETS EVERY MORNING AND ONE IN THE AFTERNOON IF NEEDED TWICE A DAY ORALLY 30 DAYS 02/21/17   [provider]  budesonide-formoterol (SYMBICORT) 160-4.5 MCG/ACT inhaler Inhale 2 puffs into the lungs 2 (two) times daily.    [provider]  Cholecalciferol (VITAMIN D3 PO) Take 2,000 Units by mouth daily.    [provider]  denosumab (PROLIA) 60 MG/ML SOLN injection Inject 60 mg into the skin every 6 (six) months. Administer in upper arm, thigh, or abdomen    [provider]  fluticasone (FLONASE) 50 MCG/ACT nasal spray Place into both nostrils daily.    [provider]  hydroxychloroquine (PLAQUENIL) 200 MG tablet Take by mouth daily.    [provider]  hydrOXYzine (ATARAX/VISTARIL) 25 MG tablet Take 1 tablet (25 mg total) by mouth every 6 (six) hours as needed for anxiety. 05/09/17   Joneric Streight, Elmyra Ricks, PA-C  losartan-hydrochlorothiazide (HYZAAR) 100-12.5 MG per tablet Take 1 tablet by mouth daily.    [provider]  mycophenolate (CELLCEPT) 500 MG tablet Take by mouth 2 (two) times daily.    [provider]  Polyvinyl Alcohol-Povidone PF (REFRESH) 1.4-0.6 % SOLN Apply to eye daily.    [provider]    Family History Family History  Problem Relation Age of Onset  . Hypertension Mother   . Diabetes Mother   .  Dementia Father   . Hypertension Father   . Hyperlipidemia Father   . Diabetes Paternal Grandmother   . Alcohol abuse Paternal Grandfather     Social History Social History   Tobacco Use  . Smoking status: Never Smoker  . Smokeless tobacco: Never Used  Substance Use Topics  . Alcohol use: No  . Drug use: No     Allergies   Erythromycin; Levofloxacin; and Sulfa antibiotics   Review of Systems Review of Systems  A complete review of systems was obtained and all systems are negative except as noted in the HPI and PMH.   Physical Exam Updated Vital Signs BP (!)  157/115   Pulse 81   Temp 98 F (36.7 C) (Oral)   Resp 19   Ht 5\' 2"  (1.575 m)   Wt 88.9 kg (196 lb)   SpO2 100%   BMI 35.85 kg/m   Physical Exam  Constitutional: She is oriented to person, place, and time. She appears well-developed and well-nourished. No distress.  HENT:  Head: Normocephalic and atraumatic.  Mouth/Throat: Oropharynx is clear and moist.  Eyes: Pupils are equal, round, and reactive to light. Conjunctivae and EOM are normal.  No TTP of maxillary or frontal sinuses  No TTP or induration of temporal arteries bilaterally  Neck: Normal range of motion. Neck supple. JVD present. No tracheal deviation present.  FROM to C-spine. Pt can touch chin to chest without discomfort. No TTP of midline cervical spine.  1.5 cm JVD on right  Cardiovascular: Normal rate, regular rhythm and intact distal pulses.  Radial pulse equal bilaterally  Pulmonary/Chest: Effort normal and breath sounds normal. No stridor. No respiratory distress. She has no wheezes. She has no rales. She exhibits no tenderness.  Abdominal: Soft. Bowel sounds are normal. She exhibits no distension and no mass. There is no tenderness. There is no rebound and no guarding.  Musculoskeletal: Normal range of motion. She exhibits no edema or tenderness.  No calf asymmetry, superficial collaterals, palpable cords, edema, Homans sign negative bilaterally.    Neurological: She is alert and oriented to person, place, and time. No cranial nerve deficit.  II-Visual fields grossly intact. III/IV/VI-Extraocular movements intact.  Pupils reactive bilaterally. V/VII-Smile symmetric, equal eyebrow raise,  facial sensation intact VIII- Hearing grossly intact IX/X-Normal gag XI-bilateral shoulder shrug XII-midline tongue extension Motor: 5/5 bilaterally with normal tone and bulk Cerebellar: Normal finger-to-nose  and normal heel-to-shin test.   Romberg negative Ambulates with a coordinated gait   Skin: Skin is warm. She is  not diaphoretic.  Psychiatric: She has a normal mood and affect.  Mildly anxious  Nursing note and vitals reviewed.    ED Treatments / Results  Labs (all labs ordered are listed, but only abnormal results are displayed) Labs Reviewed  BASIC METABOLIC PANEL - Abnormal; Notable for the following components:      Result Value   BUN 33 (*)    Creatinine, Ser 2.22 (*)    GFR calc non Af Amer 23 (*)    GFR calc Af Amer 26 (*)    All other components within normal limits  CBC WITH DIFFERENTIAL/PLATELET - Abnormal; Notable for the following components:   Hemoglobin 11.3 (*)    All other components within normal limits    EKG EKG Interpretation  Date/Time:  Tuesday May 09 2017 13:59:07 EDT Ventricular Rate:  95 PR Interval:  162 QRS Duration: 84 QT Interval:  360 QTC Calculation: 452 R Axis:   78 Text Interpretation:  Normal sinus rhythm Cannot rule out Anterior infarct , age undetermined Abnormal ECG no prior availabe for comparison Confirmed by Quintella Reichert [phone]) on 05/09/2017 5:11:54 PM   Radiology No results found.  Procedures Procedures (including critical care time)  Medications Ordered in ED Medications - No data to display   Initial Impression / Assessment and Plan / ED Course  I have reviewed the triage vital signs and the nursing notes.  Pertinent labs & imaging results that were available during my care of the patient were reviewed by me and considered in my medical decision making (see chart for details).     Vitals:   05/09/17 1531 05/09/17 1630 05/09/17 1645 05/09/17 1715  BP: (!) 187/111 (!) 163/110 (!) 164/117 (!) 157/115  Pulse: (!) 101 86 83 81  Resp: 16 (!) 21 18 19   Temp:      TempSrc:      SpO2: 100% 98% 98% 100%  Weight:      Height:        Colleen Obrien is 62 y.o. female presenting with elevated blood pressure.  She is been compliant with her losartan, she keeps rechecking it in the blood pressure keeps rising, I think  there is an element of anxiety here.  She states that she is stressed about work, she denies any suicidal ideation, homicidal ideation.  Patient has no complaints aside from a pulsation in her neck.  She does have some mild JVD on the right side.  No bruits appreciated.  No other signs or symptoms consistent with CHF.  I have advised this patient to continue to take her blood pressure meds, walk regularly, cut back on salt and follow with her doctor for recheck in 1 week.  She does have some chronic renal insufficiency which is unchanged from prior.  I double checked with the ED pharmacist, been, that it is appropriate to continue to her losartan.  Discussed case with attending physician who agrees with care plan and disposition.   Evaluation does not show pathology that would require ongoing emergent intervention or inpatient treatment. Pt is hemodynamically stable and mentating appropriately. Discussed findings and plan with patient/guardian, who agrees with care plan. All questions answered. Return precautions discussed and outpatient follow up given.      Final Clinical Impressions(s) / ED Diagnoses   Final diagnoses:  Elevated blood pressure reading    ED Discharge Orders        Ordered    hydrOXYzine (ATARAX/VISTARIL) 25 MG tablet  Every 6 hours PRN     05/09/17 1713       Erlean Mealor, Charna Elizabeth 05/09/17 Kandy Garrison, MD 05/10/17 0030

## 2017-05-18 ENCOUNTER — Other Ambulatory Visit: Payer: Self-pay | Admitting: Family Medicine

## 2017-05-18 DIAGNOSIS — E049 Nontoxic goiter, unspecified: Secondary | ICD-10-CM

## 2017-05-29 ENCOUNTER — Ambulatory Visit
Admission: RE | Admit: 2017-05-29 | Discharge: 2017-05-29 | Disposition: A | Discharge status: Home / Self Care | Payer: Managed Care, Other (non HMO) | Source: Ambulatory Visit | Attending: Family Medicine | Admitting: Family Medicine

## 2017-05-29 DIAGNOSIS — E049 Nontoxic goiter, unspecified: Secondary | ICD-10-CM

## 2018-01-23 ENCOUNTER — Other Ambulatory Visit: Payer: Self-pay | Admitting: Nephrology

## 2018-01-23 DIAGNOSIS — Z7682 Awaiting organ transplant status: Secondary | ICD-10-CM

## 2018-01-23 DIAGNOSIS — R935 Abnormal findings on diagnostic imaging of other abdominal regions, including retroperitoneum: Secondary | ICD-10-CM

## 2018-02-28 ENCOUNTER — Telehealth: Payer: Self-pay | Admitting: Neurology

## 2018-02-28 NOTE — Telephone Encounter (Signed)
Laboratory evaluation in Jan 2020, CMP showed creatinine of 2.66, GFR of 22,

## 2018-03-30 ENCOUNTER — Other Ambulatory Visit: Payer: Self-pay | Admitting: Nephrology

## 2018-04-02 ENCOUNTER — Other Ambulatory Visit: Payer: Managed Care, Other (non HMO)

## 2018-05-20 IMAGING — US US THYROID
1 series · 14 of 25 positions shown · non-contrast
Comparison: None.

CLINICAL DATA: Thyromegaly

EXAM:
THYROID ULTRASOUND
TECHNIQUE: Ultrasound examination of the thyroid gland and adjacent soft
tissues was performed.

[Series 1: us thyroid · 0.07mm/px · 14 of 50 slices shown]
[im 1/50]
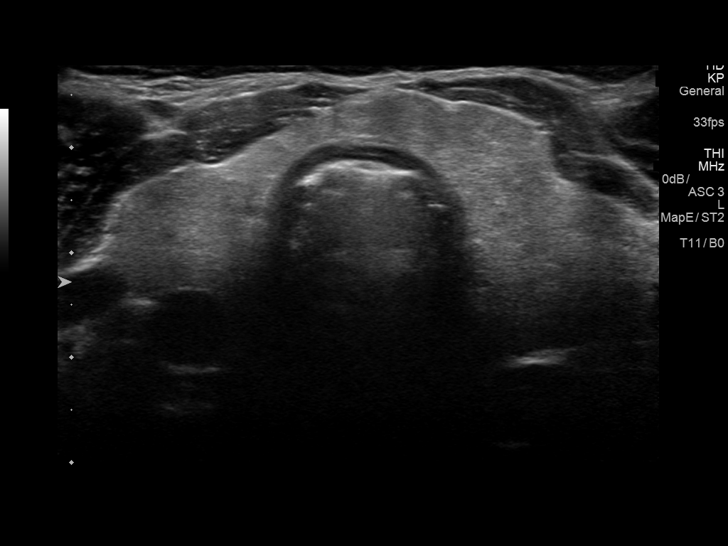
[im 5/50]
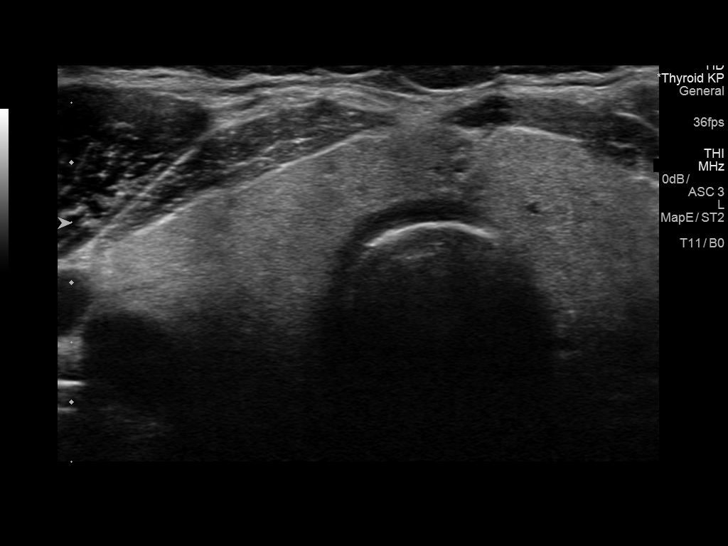
[im 9/50]
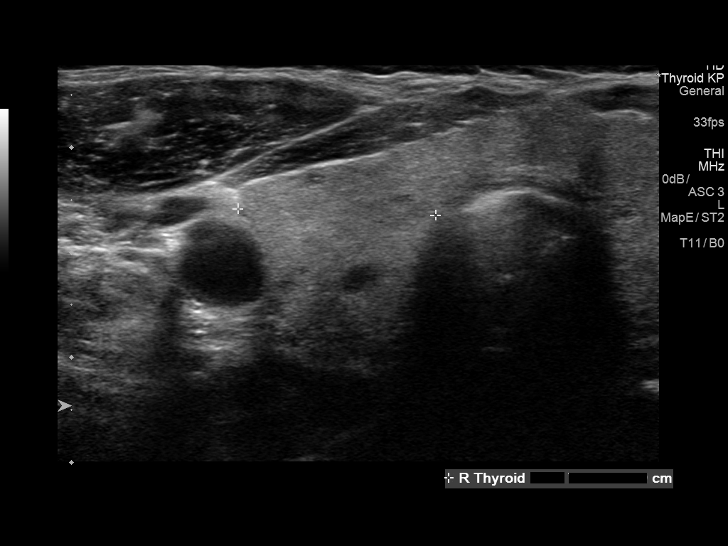
[im 13/50]
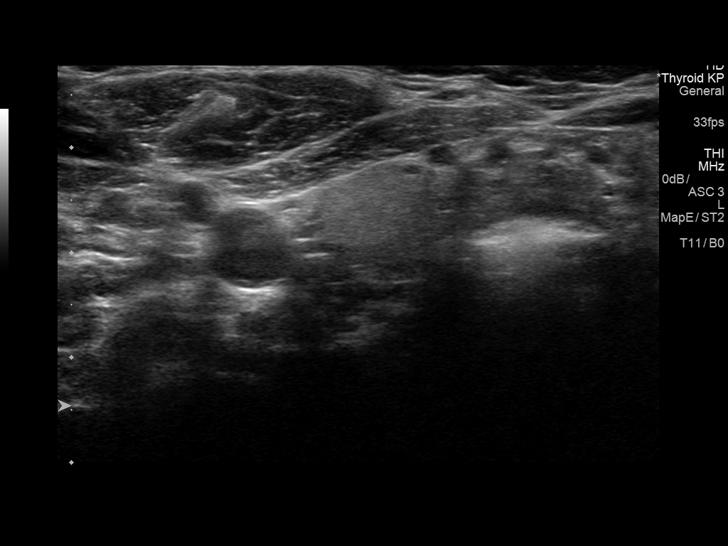
[im 17/50]
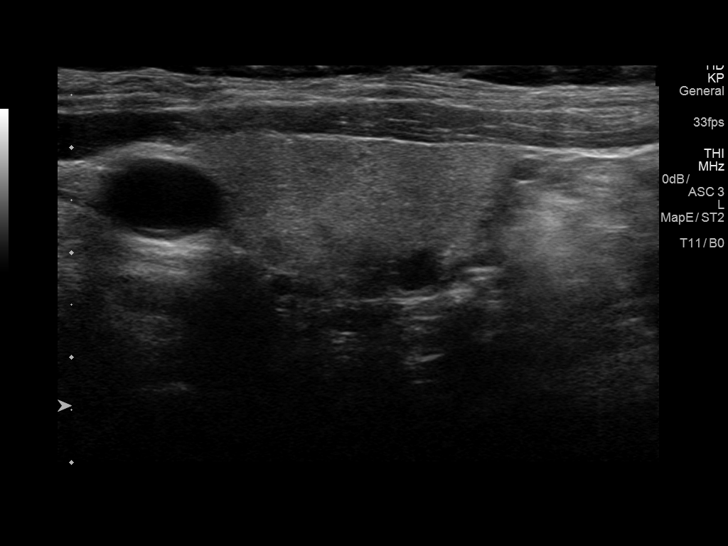
[im 19/50]
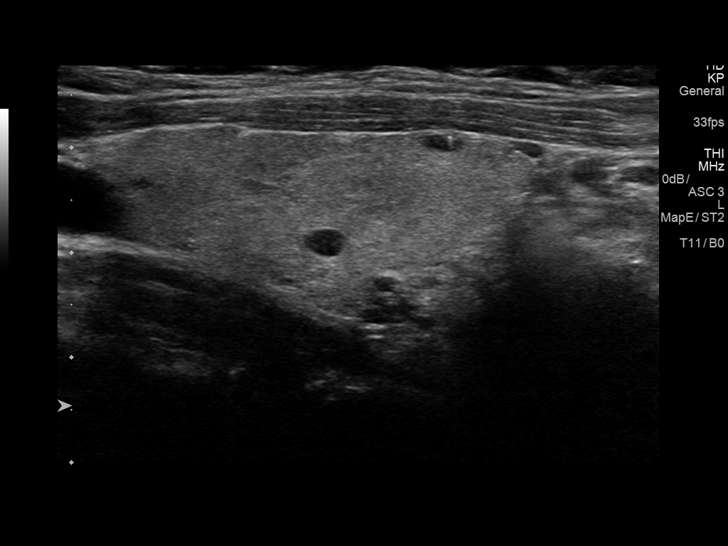
[im 23/50]
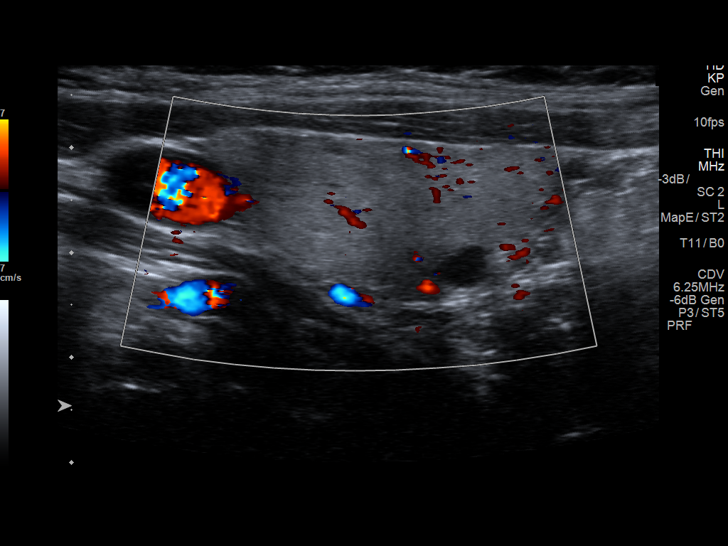
[im 27/50]
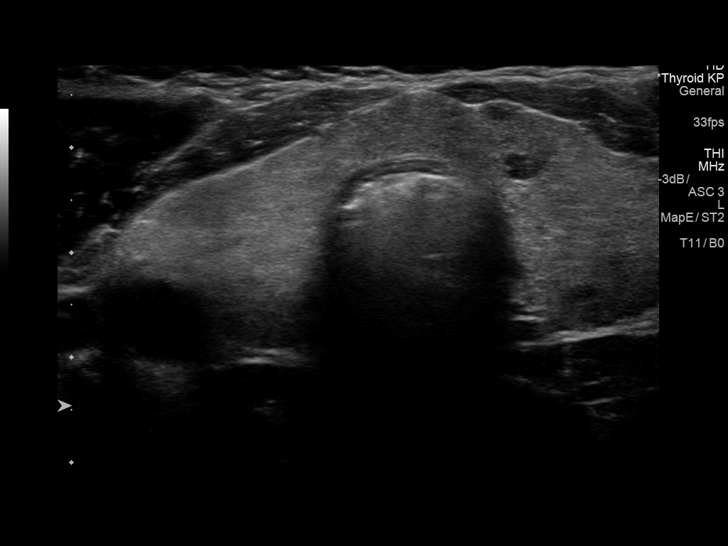
[im 31/50]
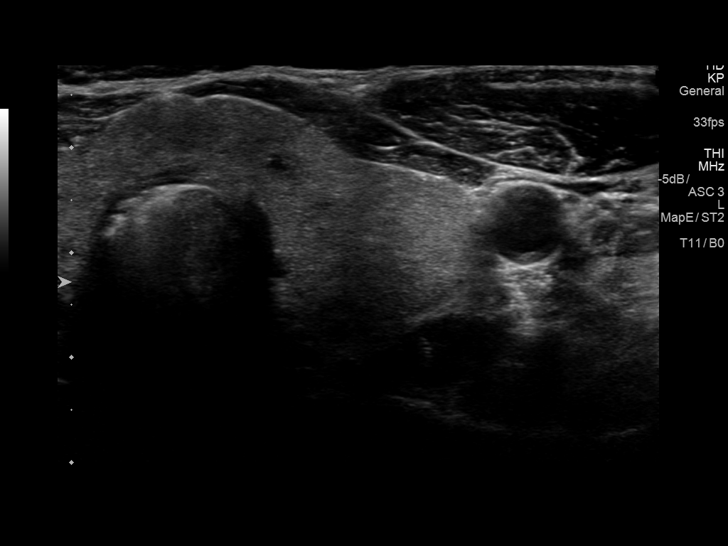
[im 33/50]
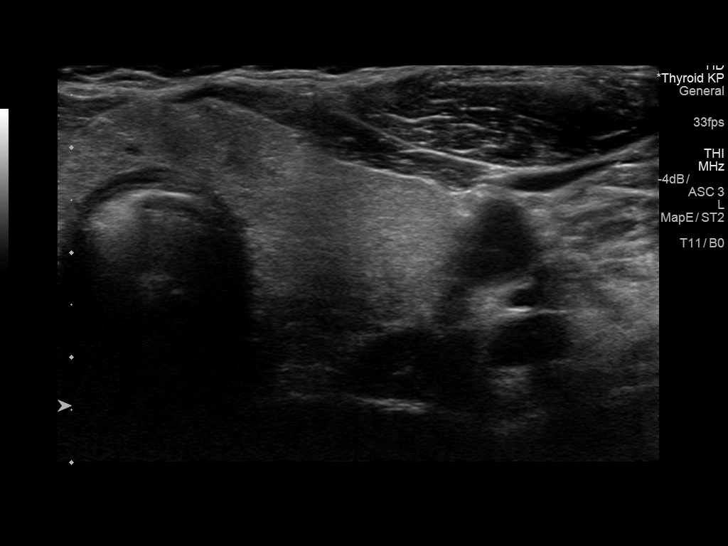
[im 37/50]
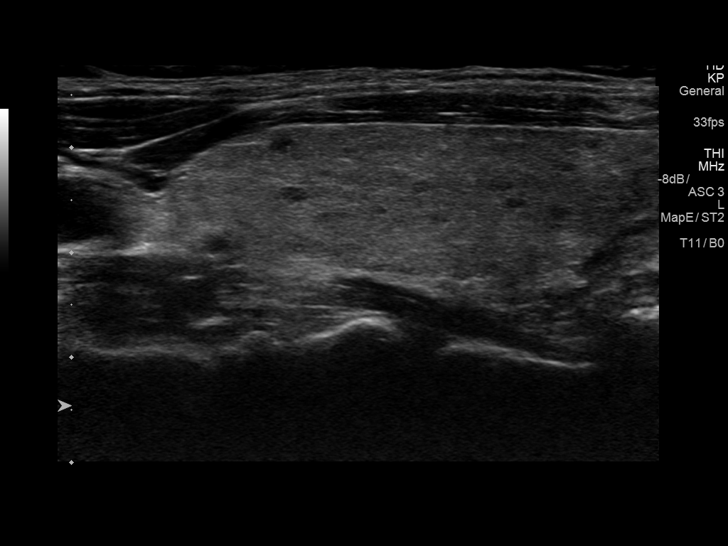
[im 41/50]
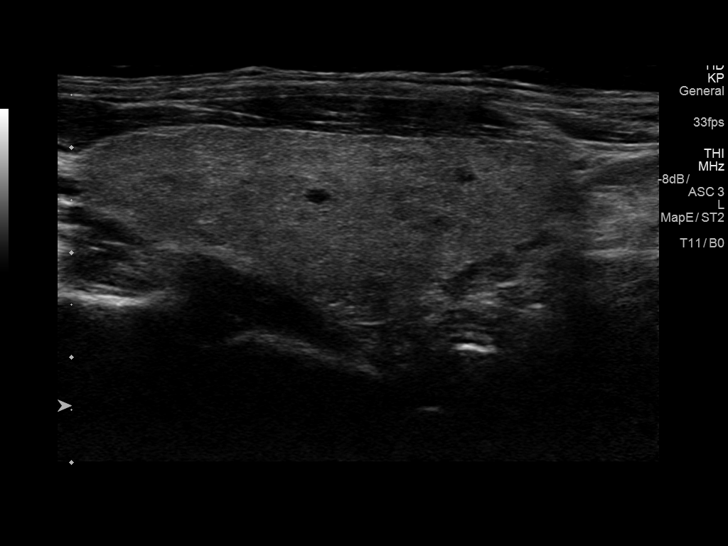
[im 45/50]
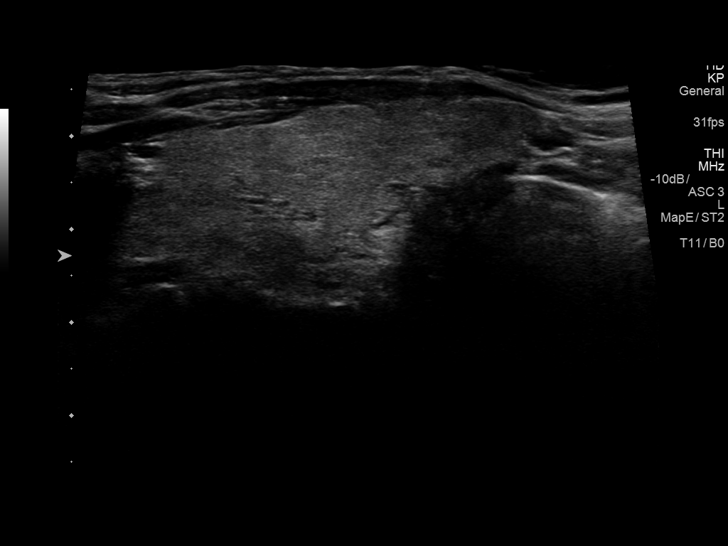
[im 50/50]
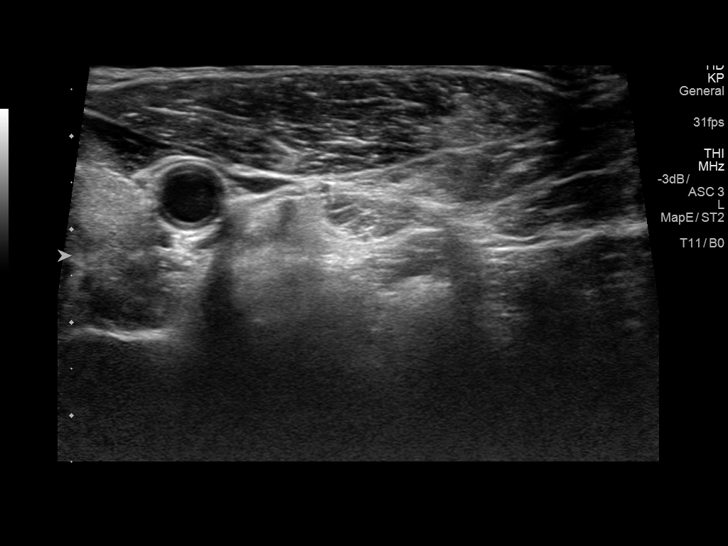

[14 of 25 positions shown; findings below may reference images not displayed]

FINDINGS: Parenchymal Echotexture: Mildly heterogenous

Isthmus: 0.7 cm thickness

Right lobe: 4.4 x 1.8 x 1.9 cm

Left lobe: 4.7 x 1.8 x 2 cm

_________________________________________________________

Estimated total number of nodules >/= 1 cm: 0

Number of spongiform nodules >/=  2 cm not described below (TR1): 0

Number of mixed cystic and solid nodules >/= 1.5 cm not described
below (TR2): 0

_________________________________________________________

Scattered small colloid cysts bilaterally all less than 0.5 cm.
IMPRESSION: 1. Normal-sized thyroid with small colloid cysts. No nodule or other
indication for biopsy or dedicated imaging follow-up.

The above is in keeping with the ACR TI-RADS recommendations - [HOSPITAL] 5246;[DATE].

## 2018-06-06 ENCOUNTER — Encounter (HOSPITAL_BASED_OUTPATIENT_CLINIC_OR_DEPARTMENT_OTHER): Payer: Self-pay

## 2018-06-06 ENCOUNTER — Other Ambulatory Visit: Payer: Self-pay

## 2018-06-06 ENCOUNTER — Emergency Department (HOSPITAL_BASED_OUTPATIENT_CLINIC_OR_DEPARTMENT_OTHER)
Admission: EM | Admit: 2018-06-06 | Discharge: 2018-06-06 | Disposition: A | Discharge status: Home / Self Care | Payer: Managed Care, Other (non HMO) | Attending: Emergency Medicine | Admitting: Emergency Medicine

## 2018-06-06 ENCOUNTER — Emergency Department (HOSPITAL_BASED_OUTPATIENT_CLINIC_OR_DEPARTMENT_OTHER): Payer: Managed Care, Other (non HMO)

## 2018-06-06 DIAGNOSIS — R0602 Shortness of breath: Secondary | ICD-10-CM | POA: Diagnosis present

## 2018-06-06 DIAGNOSIS — M7121 Synovial cyst of popliteal space [Baker], right knee: Secondary | ICD-10-CM

## 2018-06-06 DIAGNOSIS — J45909 Unspecified asthma, uncomplicated: Secondary | ICD-10-CM | POA: Diagnosis not present

## 2018-06-06 DIAGNOSIS — N184 Chronic kidney disease, stage 4 (severe): Secondary | ICD-10-CM | POA: Insufficient documentation

## 2018-06-06 DIAGNOSIS — I129 Hypertensive chronic kidney disease with stage 1 through stage 4 chronic kidney disease, or unspecified chronic kidney disease: Secondary | ICD-10-CM | POA: Insufficient documentation

## 2018-06-06 DIAGNOSIS — J810 Acute pulmonary edema: Secondary | ICD-10-CM | POA: Insufficient documentation

## 2018-06-06 DIAGNOSIS — M79604 Pain in right leg: Secondary | ICD-10-CM | POA: Diagnosis not present

## 2018-06-06 DIAGNOSIS — Z79899 Other long term (current) drug therapy: Secondary | ICD-10-CM | POA: Diagnosis not present

## 2018-06-06 DIAGNOSIS — I82890 Acute embolism and thrombosis of other specified veins: Secondary | ICD-10-CM

## 2018-06-06 LAB — CBC WITH DIFFERENTIAL/PLATELET
Abs Immature Granulocytes: 0.03 10*3/uL (ref 0.00–0.07)
Basophils Absolute: 0 10*3/uL (ref 0.0–0.1)
Basophils Relative: 1 %
Eosinophils Absolute: 0.2 10*3/uL (ref 0.0–0.5)
Eosinophils Relative: 2 %
HCT: 33.5 % — ABNORMAL LOW (ref 36.0–46.0)
Hemoglobin: 10 g/dL — ABNORMAL LOW (ref 12.0–15.0)
Immature Granulocytes: 0 %
Lymphocytes Relative: 10 %
Lymphs Abs: 0.7 10*3/uL (ref 0.7–4.0)
MCH: 25.4 pg — ABNORMAL LOW (ref 26.0–34.0)
MCHC: 29.9 g/dL — ABNORMAL LOW (ref 30.0–36.0)
MCV: 85 fL (ref 80.0–100.0)
Monocytes Absolute: 0.6 10*3/uL (ref 0.1–1.0)
Monocytes Relative: 9 %
Neutro Abs: 5.3 10*3/uL (ref 1.7–7.7)
Neutrophils Relative %: 78 %
Platelets: 215 10*3/uL (ref 150–400)
RBC: 3.94 MIL/uL (ref 3.87–5.11)
RDW: 16.9 % — ABNORMAL HIGH (ref 11.5–15.5)
WBC: 6.8 10*3/uL (ref 4.0–10.5)
nRBC: 0 % (ref 0.0–0.2)

## 2018-06-06 LAB — BASIC METABOLIC PANEL
Anion gap: 5 (ref 5–15)
BUN: 39 mg/dL — ABNORMAL HIGH (ref 8–23)
CO2: 18 mmol/L — ABNORMAL LOW (ref 22–32)
Calcium: 9.5 mg/dL (ref 8.9–10.3)
Chloride: 113 mmol/L — ABNORMAL HIGH (ref 98–111)
Creatinine, Ser: 2.46 mg/dL — ABNORMAL HIGH (ref 0.44–1.00)
GFR calc Af Amer: 24 mL/min — ABNORMAL LOW (ref >60)
GFR calc non Af Amer: 20 mL/min — ABNORMAL LOW (ref >60)
Glucose, Bld: 91 mg/dL (ref 70–99)
Potassium: 5 mmol/L (ref 3.5–5.1)
Sodium: 136 mmol/L (ref 135–145)

## 2018-06-06 LAB — BRAIN NATRIURETIC PEPTIDE: B Natriuretic Peptide: 140.6 pg/mL — ABNORMAL HIGH (ref 0.0–100.0)

## 2018-06-06 MED ORDER — FUROSEMIDE 10 MG/ML IJ SOLN
40.0000 mg | Freq: Once | INTRAMUSCULAR | Status: AC
  Administered 2018-06-06: 15:00:00 40 mg via INTRAVENOUS
  Filled 2018-06-06: qty 4

## 2018-06-06 MED ORDER — HYDROCODONE-ACETAMINOPHEN 5-325 MG PO TABS: 1.0000 | ORAL_TABLET | Freq: Three times a day (TID) | ORAL | 0 refills | Status: DC | PRN

## 2018-06-06 NOTE — ED Provider Notes (Addendum)
[facility] EMERGENCY DEPARTMENT Provider Note   CSN: [id number] Arrival date & time: 06/06/18  1157    History   Chief Complaint Chief Complaint  Patient presents with   Shortness of Breath   Leg Pain    HPI Colleen Obrien is a 63 y.o. female.     Patient with complaint of some mild shortness of breath for 6 days.  And pain behind her right knee and right calf for about a month.  Patient felt a pop last night with some increased pain.  She has had some mild increasing of the shortness of breath with exertion.  Patient's never had pain in the knee area or calf area before.  Patient's past medical history is significant for history of lupus hypertension asthma and chronic kidney failure.  Patient is followed by nephrology.  Patient recently had her blood pressure medicine stopped due to how her kidneys were responding so patient normally takes losartan hydrochlorothiazide and that has been stopped.  Patient last had any Lasix on April 24.  Patient denies any chest pain.  Patient denies any feeling like she is going to pass out.  Patient denies any fevers or upper respiratory infection symptoms.     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      Home Medications    Prior to Admission medications   Medication Sig Start Date End Date Taking? Authorizing Provider  albuterol (PROVENTIL HFA;VENTOLIN HFA) 108 (90 BASE) MCG/ACT inhaler Inhale into the lungs every 6 (six) hours as needed for wheezing or  shortness of breath.    [provider]  amphetamine-dextroamphetamine (ADDERALL) 5 MG tablet 2 TABLETS EVERY MORNING AND ONE IN THE AFTERNOON IF NEEDED TWICE A DAY ORALLY 30 DAYS 02/21/17   [provider]  budesonide-formoterol (SYMBICORT) 160-4.5 MCG/ACT inhaler Inhale 2 puffs into the lungs 2 (two) times daily.    [provider]  Cholecalciferol (VITAMIN D3 PO) Take 2,000 Units by mouth daily.    [provider]  denosumab (PROLIA) 60 MG/ML SOLN injection Inject 60 mg into the skin every 6 (six) months. Administer in upper arm, thigh, or abdomen    [provider]  fluticasone (FLONASE) 50 MCG/ACT nasal spray Place into both nostrils daily.    [provider]  HYDROcodone-acetaminophen (NORCO/VICODIN) 5-325 MG tablet Take 1 tablet by mouth every 8 (eight) hours as needed for moderate pain. 06/06/18   Fredia Sorrow, MD  hydroxychloroquine (PLAQUENIL) 200 MG tablet Take by mouth daily.    [provider]  hydrOXYzine (ATARAX/VISTARIL) 25 MG tablet Take 1 tablet (25 mg total) by mouth every 6 (six) hours as needed for anxiety. 05/09/17   Pisciotta, Elmyra Ricks, PA-C  losartan-hydrochlorothiazide (HYZAAR) 100-12.5 MG per tablet Take 1 tablet by mouth daily.    [provider]  mycophenolate (CELLCEPT) 500 MG tablet Take by mouth 2 (two) times daily.    [provider]  Polyvinyl Alcohol-Povidone PF (REFRESH) 1.4-0.6 % SOLN Apply to eye daily.    [provider]    Family History Family History  Problem Relation Age of Onset   Hypertension Mother    Diabetes Mother    Dementia Father    Hypertension Father    Hyperlipidemia Father    Diabetes Paternal Grandmother    Alcohol abuse Paternal Grandfather     Social History Social History   Tobacco Use   Smoking status: Never Smoker   Smokeless tobacco: Never Used  Substance Use Topics   Alcohol use: No   Drug use: No     Allergies     Erythromycin; Levofloxacin; and Sulfa antibiotics   Review of Systems Review of Systems  Constitutional: Negative for chills and fever.  HENT: Negative for congestion, rhinorrhea and sore throat.   Eyes: Negative for visual disturbance.  Respiratory: Positive for shortness of breath. Negative for cough.   Cardiovascular: Negative for chest pain and leg swelling.  Gastrointestinal: Negative for abdominal pain, diarrhea, nausea and vomiting.  Genitourinary: Negative for dysuria.  Musculoskeletal: Positive for joint swelling. Negative for back pain and neck pain.  Skin: Negative for rash.  Neurological: Negative for dizziness, syncope, light-headedness and headaches.  Hematological: Does not bruise/bleed easily.  Psychiatric/Behavioral: Negative for confusion.     Physical Exam Updated Vital Signs BP (!) 140/93 (BP Location: Left Arm)    Pulse 77    Temp 98.2 F (36.8 C) (Oral)    Resp 18    Ht 1.575 m (5\' 2" )    Wt 92.1 kg    SpO2 100%    BMI 37.13 kg/m   Physical Exam Vitals signs and nursing note reviewed.  Constitutional:      General: She is not in acute distress.    Appearance: Normal appearance. She is well-developed.  HENT:     Head: Normocephalic and atraumatic.  Eyes:     Extraocular Movements: Extraocular movements intact.     Conjunctiva/sclera: Conjunctivae normal.     Pupils: Pupils are equal, round, and reactive to light.  Neck:     Musculoskeletal: Normal range of motion and neck supple.  Cardiovascular:     Rate and Rhythm: Normal rate and regular rhythm.     Heart sounds: Normal heart sounds. No murmur.  Pulmonary:     Effort: Pulmonary effort is normal. No respiratory distress.     Breath sounds: Normal breath sounds. No wheezing or rales.  Abdominal:     Palpations: Abdomen is soft.     Tenderness: There is no abdominal tenderness.  Musculoskeletal:        General: Swelling and tenderness present. No deformity or signs of injury.     Right lower  leg: No edema.     Left lower leg: No edema.     Comments: No swelling or pitting edema to the lower part of both legs or around the ankles.  No foot swelling.  Dorsalis pedis pulse bilaterally 1-2+.  Good cap refill.  Patient has some fullness to the right calf area but it is minimal some discomfort to palpation behind the right knee.  Maybe a little swelling of the knee.  Patella is nondisplaced.  No erythema.  Thigh without any swelling or tenderness.  Skin:    General: Skin is warm and dry.     Capillary Refill: Capillary refill takes less than 2 seconds.  Neurological:     General: No focal deficit present.  Mental Status: She is alert and oriented to person, place, and time.     Cranial Nerves: No cranial nerve deficit.     Sensory: Sensory deficit present.      ED Treatments / Results  Labs (all labs ordered are listed, but only abnormal results are displayed) Labs Reviewed  CBC WITH DIFFERENTIAL/PLATELET - Abnormal; Notable for the following components:      Result Value   Hemoglobin 10.0 (*)    HCT 33.5 (*)    MCH 25.4 (*)    MCHC 29.9 (*)    RDW 16.9 (*)    All other components within normal limits  BASIC METABOLIC PANEL - Abnormal; Notable for the following components:   Chloride 113 (*)    CO2 18 (*)    BUN 39 (*)    Creatinine, Ser 2.46 (*)    GFR calc non Af Amer 20 (*)    GFR calc Af Amer 24 (*)    All other components within normal limits  BRAIN NATRIURETIC PEPTIDE - Abnormal; Notable for the following components:   B Natriuretic Peptide 140.6 (*)    All other components within normal limits    EKG None  Radiology Dg Chest 2 View  Result Date: 06/06/2018 CLINICAL DATA:  Shortness of breath and right leg pain since 4 days ago. EXAM: CHEST - 2 VIEW COMPARISON:  None. FINDINGS: Borderline cardiomegaly. Hiatal hernia behind the heart. Mild aortic atherosclerosis. Possible venous hypertension without frank edema. No infiltrate, collapse or effusion.  IMPRESSION: Cardiomegaly. Probable pulmonary venous hypertension without frank edema. Aortic atherosclerosis. Hiatal hernia. Electronically Signed   By: Nelson Chimes M.D.   On: 06/06/2018 13:38   US Venous Img Lower Right (dvt Study)  Result Date: 06/06/2018 CLINICAL DATA:  63 year old female with right lower extremity pain and swelling EXAM: RIGHT LOWER EXTREMITY VENOUS DOPPLER ULTRASOUND TECHNIQUE: Gray-scale sonography with graded compression, as well as color Doppler and duplex ultrasound were performed to evaluate the lower extremity deep venous systems from the level of the common femoral vein and including the common femoral, femoral, profunda femoral, popliteal and calf veins including the posterior tibial, peroneal and gastrocnemius veins when visible. The superficial great saphenous vein was also interrogated. Spectral Doppler was utilized to evaluate flow at rest and with distal augmentation maneuvers in the common femoral, femoral and popliteal veins. COMPARISON:  None. FINDINGS: Contralateral Common Femoral Vein: Respiratory phasicity is normal and symmetric with the symptomatic side. No evidence of thrombus. Normal compressibility. Common Femoral Vein: No evidence of thrombus. Normal compressibility, respiratory phasicity and response to augmentation. Saphenofemoral Junction: No evidence of thrombus. Normal compressibility and flow on color Doppler imaging. Profunda Femoral Vein: No evidence of thrombus. Normal compressibility and flow on color Doppler imaging. Femoral Vein: No evidence of thrombus. Normal compressibility, respiratory phasicity and response to augmentation. Popliteal Vein: No evidence of thrombus. Normal compressibility, respiratory phasicity and response to augmentation. Calf Veins: No evidence of thrombus. Normal compressibility and flow on color Doppler imaging. Superficial Great Saphenous Vein: No evidence of thrombus. Normal compressibility. However, the small saphenous vein  was also imaged and found to be nonocclusive. The lumen is filled with echogenic material and there is evidence of only partial flow on color Doppler imaging. Venous Reflux:  None. Other Findings: Anechoic fluid collection in the popliteal fossa measures 4.9 x 1.9 x 3.6 cm. Imaging findings are consistent with a Baker's cyst. IMPRESSION: 1. Positive for superficial thrombosis of the small saphenous vein in the posterior mid calf.  The imaging appearance suggests that this may be a subacute or chronic process. 2. No evidence for deep venous thrombosis in the right lower extremity. 3. Incidental note is made of a Baker's cyst in the popliteal fossa. Electronically Signed   By: Jacqulynn Cadet M.D.   On: 06/06/2018 14:15    Procedures Procedures (including critical care time)  Medications Ordered in ED Medications  furosemide (LASIX) injection 40 mg (has no administration in time range)     Initial Impression / Assessment and Plan / ED Course  I have reviewed the triage vital signs and the nursing notes.  Pertinent labs & imaging results that were available during my care of the patient were reviewed by me and considered in my medical decision making (see chart for details).       Patient's room air oxygen saturations 100%.  No tachypnea no tachycardia.  Cardiac monitoring without arrhythmia.  Blood pressure however elevated elevated.  Getting systolic pressures in the 140 to 150 range.  Diastolic pressures are above 90.  As stated in the history patient recently taken off her blood pressure medicines.  Patient with some shortness of breath no wheezing no rales.  Patient with some discomfort behind the right knee may be some calf swelling no distal edema.  Patient's had the symptoms in this area for about a month.  Will get ultrasound lower extremity to rule out DVT.  But with the pain and a sensation of popping that occurred recently suspicious for a Baker's cyst clinically.  Patient known to  have chronic renal failure.  Patient's BUN and creatinine and GFR is without significant change today but may be slightly worse.  Sounds as if patient probably had a blood pressure medicine of losartan and hydrochlorothiazide held due to some of the renal function.  Patient symptoms with exertional shortness of breath without tachycardia or hypoxia could be due to some mild pulmonary edema.  Chest x-ray suggestive of some mild pulmonary edema.  BNP is elevated some.  Renal function as stated above.  CBC without any significant leukocytosis or significant anemia.  Doppler study of the right lower extremity has to acute findings.  1 is a Baker's cyst.  2 is that there is a superficial saphenous vein thrombosis.  No evidence of deep thrombosis.  Clinically I do not feel that patient has symptoms consistent with a pulmonary embolus.  But do her renal function she would have to have VQ scan.  I think without hypoxia and without tachycardia I think that the shortness of breath that she has which is mild is most likely secondary to some fluid overload.  Patient will be given 40 mg of Lasix here and recommended to start back on her blood pressure medicine to get her blood pressure under better control.  I realize that nephrology may not want this to occur so patient will contact [facility] tomorrow to go over these concerns.  In addition due to some of the pain behind the right knee patient will be given a course of hydrocodone.  This will be reduced down to 1 tablet every 8 hours due to her renal function.  Patient will return for any chest pain or worsening shortness of breath which would raise the concerns for possible pulmonary embolus.  Patient nontoxic no acute distress.   Final Clinical Impressions(s) / ED Diagnoses   Final diagnoses:  Right leg pain  Baker's cyst of knee, right  Superficial vein thrombosis  Acute pulmonary edema ([place])  Chronic renal failure, stage 4 (severe) [facility])    ED  Discharge Orders         Ordered    HYDROcodone-acetaminophen (NORCO/VICODIN) 5-325 MG tablet  Every 8 hours PRN     06/06/18 1436           Fredia Sorrow, MD 06/06/18 1447    Fredia Sorrow, MD 06/06/18 [phone]

## 2018-06-06 NOTE — ED Triage Notes (Signed)
Pt c/o SOB x 6 days-pain to right calf "for a while but felt a pop last night"-pt with DOE and with speech-slow steady gait

## 2018-06-06 NOTE — Discharge Instructions (Addendum)
Intact [facility] for follow-up and discussion about the concern we had for your elevated blood pressure and some mild pulmonary edema on your lungs which reveals the cause of your mild shortness of breath.  Could be tied in with your lupus as well.  No wheezing so do not think it is due to asthma.  Resume your losartan hydrochlorothiazide blood pressure medicine for now and discuss that with your nephrologist.  And let them decide if they want to stop it.  In addition we found a Baker's cyst behind your right knee and a superficial vein thrombosis.  No evidence of deep vein thrombosis.  No anticoagulation for this required.  However do return for any chest pain worsening shortness of breath.  Can follow-up with your regular doctor regarding the right leg pain.  Take the hydrocodone as directed and as needed every 8 hours.

## 2018-06-28 ENCOUNTER — Telehealth: Payer: Self-pay | Admitting: *Deleted

## 2018-06-28 NOTE — Telephone Encounter (Signed)
Labs received from [facility] (collected on 06/14/2018).  Renal Function Panel: BUN: 52 (H) GFR: 21

## 2018-08-11 ENCOUNTER — Other Ambulatory Visit: Payer: Self-pay | Admitting: *Deleted

## 2018-08-11 DIAGNOSIS — Z20822 Contact with and (suspected) exposure to covid-19: Secondary | ICD-10-CM

## 2018-08-11 NOTE — Progress Notes (Signed)
[id number] 

## 2018-08-17 LAB — NOVEL CORONAVIRUS, NAA: SARS-CoV-2, NAA: NOT DETECTED

## 2019-01-10 ENCOUNTER — Other Ambulatory Visit (HOSPITAL_COMMUNITY)
Admission: RE | Admit: 2019-01-10 | Discharge: 2019-01-10 | Disposition: A | Discharge status: Home / Self Care | Payer: Managed Care, Other (non HMO) | Source: Ambulatory Visit | Attending: Family Medicine | Admitting: Family Medicine

## 2019-01-10 ENCOUNTER — Other Ambulatory Visit: Payer: Self-pay | Admitting: Family Medicine

## 2019-01-10 DIAGNOSIS — Z124 Encounter for screening for malignant neoplasm of cervix: Secondary | ICD-10-CM | POA: Diagnosis present

## 2019-01-14 LAB — CYTOLOGY - PAP: Diagnosis: NEGATIVE

## 2019-05-14 ENCOUNTER — Encounter: Payer: Self-pay | Admitting: Family Medicine

## 2019-05-14 ENCOUNTER — Other Ambulatory Visit: Payer: Self-pay

## 2019-05-14 ENCOUNTER — Ambulatory Visit (INDEPENDENT_AMBULATORY_CARE_PROVIDER_SITE_OTHER): Payer: 59 | Admitting: Family Medicine

## 2019-05-14 ENCOUNTER — Telehealth: Payer: Self-pay | Admitting: Family Medicine

## 2019-05-14 ENCOUNTER — Ambulatory Visit: Payer: Self-pay

## 2019-05-14 ENCOUNTER — Ambulatory Visit (INDEPENDENT_AMBULATORY_CARE_PROVIDER_SITE_OTHER): Payer: 59

## 2019-05-14 VITALS — BP 160/100 | HR 90 | Ht 62.0 in | Wt 201.0 lb

## 2019-05-14 DIAGNOSIS — M25552 Pain in left hip: Secondary | ICD-10-CM

## 2019-05-14 DIAGNOSIS — M8700 Idiopathic aseptic necrosis of unspecified bone: Secondary | ICD-10-CM

## 2019-05-14 NOTE — Telephone Encounter (Signed)
I called Colleen Obrien about her x-ray results.  Radiology is concern for avascular necrosis which I did not appreciate during the visit with the patient.  Discussed that radiology recommends MRI to further characterize possible avascular necrosis.  I agree with this.  Plan for MRI.  Patient will think about it in the meantime.  Also discussed the avascular necrosis is often caused by steroids which she certainly has had plenty of during her treatment for lupus.  She did note that she is feeling a lot better after the diagnostic injection today.  This does support intra-articular cause of her hip pain.

## 2019-05-14 NOTE — Progress Notes (Signed)
Subjective:    I'm seeing this patient as a consultation for:  Dr. Carol Ada. Note will be routed back to referring provider/PCP.  CC: L thigh and groin pain  I, Judy Pimple, am serving as a scribe for Dr. Lynne Leader.  HPI: Pt is a 64 y/o female presenting w/ c/o L thigh and groin pain x 2 months.  She rates her pain 7-10/10 as and describes her pain as stabbing when standing also feels like something needs to pop.  Of note, pt has a hx of gout, lupus and CKD stage 4.  She is currently in the process of getting on the kidney transplant list.  She denies any injury to explain her pain or change in activity.  She is tried some Tylenol and intermittently ibuprofen for pain control.    Radiating pain: yes starts from L groin and radiates medial leg to knee Low back pain:yes sometimes the pain is in the upper L buttock Aggravating factors: Walking and standing Treatments tried: NSAIDs  Diagnostic imaging:  Past medical history, Surgical history, Family history, Social history, Allergies, and medications have been entered into the medical record, reviewed.   Review of Systems: No new headache, visual changes, nausea, vomiting, diarrhea, constipation, dizziness, abdominal pain, skin rash, fevers, chills, night sweats, weight loss, swollen lymph nodes, body aches, joint swelling, muscle aches, chest pain, shortness of breath, mood changes, visual or auditory hallucinations.   Objective:    Vitals:   05/14/19 0905  BP: (!) 160/100  Pulse: 90  SpO2: 95%   General: Well Developed, well nourished, and in no acute distress.  Neuro/Psych: Alert and oriented x3, extra-ocular muscles intact, able to move all 4 extremities, sensation grossly intact. Skin: Warm and dry, no rashes noted.  Respiratory: Not using accessory muscles, speaking in full sentences, trachea midline.  Cardiovascular: Pulses palpable, no extremity edema. Abdomen: Does not appear distended. MSK:  Left hip:  Normal-appearing Nontender. Motion intact however pain with internal rotation and flexion. Strength 4/5 hip flexion 4/5 hip abduction 4/5 hip external rotation.  Normal internal rotation and hip adduction. Pain with resisted flexion and abduction. Positive FADIR test. Normal gait. Leg lengths equal.  Lab and Radiology Results  X-ray images left hip obtained today personally independent reviewed. No avascular necrosis or severe degenerative changes.  No acute fracture. Await formal radiology review.  Procedure: Real-time Ultrasound Guided Injection of left hip femoral acetabular joint Device: Philips Affiniti 50G Images permanently stored and available for review in the ultrasound unit. Verbal informed consent obtained.  Discussed risks and benefits of procedure. Warned about infection bleeding damage to structures skin hypopigmentation and fat atrophy among others. Patient expresses understanding and agreement Time-out conducted.   Noted no overlying erythema, induration, or other signs of local infection.   Skin prepped in a sterile fashion.   Local anesthesia: Topical Ethyl chloride.   With sterile technique and under real time ultrasound guidance:  40 mg of Kenalog and 3 mL of Marcaine injected easily.   Completed without difficulty   Pain moderately resolved suggesting accurate placement of the medication.   Advised to call if fevers/chills, erythema, induration, drainage, or persistent bleeding.   Images permanently stored and available for review in the ultrasound unit.  Impression: Technically successful ultrasound guided injection.       Impression and Recommendations:    Assessment and Plan: 64 y.o. female with left anterior hip pain ongoing for several months.  Medical history significant for lupus and CKD 4 currently  process of getting on transplant list. Patient's pain is very likely originating from intra-articular hip.  She had moderate pain response immediately  following injection.  It is possible she also has some hip flexor tendinopathy to explain some of her pain as well.  She also has hip abductor weakness.  Hopefully the steroid component injection will provide some benefit.  Plan also for physical therapy.  Recommend against oral NSAIDs given CKD 4. Recheck in about a month.  Return sooner if needed.Marland Kitchen  PDMP not reviewed this encounter. Orders Placed This Encounter  Procedures  . DG Hip Unilat W OR W/O Pelvis 2-3 Views Left    Standing Status:   Future    Number of Occurrences:   1    Standing Expiration Date:   07/13/2020    Order Specific Question:   Reason for Exam (SYMPTOM  OR DIAGNOSIS REQUIRED)    Answer:   eval hip pain. DJD. AVN?    Order Specific Question:   Preferred imaging location?    Answer:   [facility]    Order Specific Question:   Radiology Contrast Protocol - do NOT remove file path    Answer:   [email or web address]  . [name] LIMITED JOINT SPACE STRUCTURES LOW LEFT(NO LINKED CHARGES)    Order Specific Question:   Reason for Exam (SYMPTOM  OR DIAGNOSIS REQUIRED)    Answer:   lt hip inj    Order Specific Question:   Preferred imaging location?    Answer:   [facility]  . Ambulatory referral to Physical Therapy    Referral Priority:   Routine    Referral Type:   Physical Medicine    Referral Reason:   Specialty Services Required    Requested Specialty:   Physical Therapy   No orders of the defined types were placed in this encounter.   Discussed warning signs or symptoms. Please see discharge instructions. Patient expresses understanding.   The above documentation has been reviewed and is accurate and complete Lynne Leader

## 2019-05-14 NOTE — Patient Instructions (Addendum)
Thank you for coming in today. Call or go to the ER if you develop a large red swollen joint with extreme pain or oozing puss.   Attend PT.  Pay attention to how the hip feels over the next few hours while it is numbed.   Recheck with me in about 1 month.   I will make sure Dr Tamala Julian and the rest of your doctors get a copy of today's note.

## 2019-05-14 NOTE — Progress Notes (Signed)
I called patient to discuss x-ray report.  Please see phone note dated April 13.  Plan for MRI.

## 2019-05-27 ENCOUNTER — Ambulatory Visit: Payer: 59 | Admitting: Physical Therapy

## 2019-06-10 ENCOUNTER — Encounter: Payer: Self-pay | Admitting: Physical Therapy

## 2019-06-10 ENCOUNTER — Ambulatory Visit: Payer: 59 | Attending: Family Medicine | Admitting: Physical Therapy

## 2019-06-10 ENCOUNTER — Other Ambulatory Visit: Payer: Self-pay

## 2019-06-10 DIAGNOSIS — M6281 Muscle weakness (generalized): Secondary | ICD-10-CM | POA: Insufficient documentation

## 2019-06-10 DIAGNOSIS — M25552 Pain in left hip: Secondary | ICD-10-CM | POA: Insufficient documentation

## 2019-06-11 ENCOUNTER — Ambulatory Visit: Payer: 59 | Admitting: Family Medicine

## 2019-06-11 NOTE — Patient Instructions (Signed)
Med bridge Sit to stand Supine banded clams Supine banded bridge SL hip abduction  SL clam Supine wide knees ER/IR hips "windshield wipers"  DO 2 times per day, 2 x 10  Call if needed Green, PT 06/11/19 8:26 AM Phone: [phone] Fax: [phone]

## 2019-06-11 NOTE — Therapy (Signed)
[place], [place], [address] Phone: [phone]   Fax:  [phone]  Physical Therapy Evaluation  Patient Details  Name: Colleen Obrien MRN: [id number] Date of Birth: 1955/10/24 Referring Provider (PT): Dr. Lynne Leader    Encounter Date: 06/10/2019  PT End of Session - 06/11/19 0826    Visit Number  1    Number of Visits  4    Date for PT Re-Evaluation  08/05/19    Authorization Type  [employer] (high copay)    PT Start Time  1016   pt late, 15 min   PT Stop Time  1050    PT Time Calculation (min)  34 min    Activity Tolerance  Patient tolerated treatment well    Behavior During Therapy  Anxious       Past Medical History:  Diagnosis Date  . Asthma   . Gout   . Hypertension   . Lupus ([place])   . Narcolepsy   . Sleep apnea     Past Surgical History:  Procedure Laterality Date  . CESAREAN SECTION    . TUBAL LIGATION      There were no vitals filed for this visit.   Subjective Assessment - 06/10/19 1020    Subjective  Patient has been having L hip pain for about 2 months.  She complains of L hip pain anteriorly mostly in AM hours.  Pain prevents her from performing basic mobility and ADLs.  She has difficulty with transfers after sitting too long and isunable to squat, bend , stoop for reaching or lifting. She is scheduled to have MRI this weekend.    Pertinent History  lupus, HTN, gout, CKD on transplant list    Limitations  Standing;Lifting;House hold activities;Other (comment);Walking;Sitting    Diagnostic tests  [name] and  XR showed. No avascular necrosis or severe degenerative changes, had injection 1 mo. Radiologist stated suspicion for AVN but not confirmed    Patient Stated Goals  Pain relief    Currently in Pain?  Yes    Pain Score  3     Pain Location  Hip    Pain Orientation  Left;Anterior    Pain Descriptors / Indicators  Sharp;Aching    Pain Type  Acute pain    Pain  Radiating Towards  L thigh    Pain Onset  More than a month ago    Pain Frequency  Intermittent    Aggravating Factors   standing, walking    Pain Relieving Factors  gentle activity, Tylenol    Effect of Pain on Daily Activities  exercise, housework (squatting)    Multiple Pain Sites  No         [facility] PT Assessment - 06/11/19 0001      Assessment   Medical Diagnosis  L hip pain     Referring Provider (PT)  Dr. Lynne Leader     Onset Date/Surgical Date  --   2 mos    Prior Therapy  No       Precautions   Precautions  None      Restrictions   Weight Bearing Restrictions  No      Balance Screen   Has the patient fallen in the past 6 months  No    Has the patient had a decrease in activity level because of a fear of falling?   No    Is the patient reluctant to leave their home because of  a fear of falling?   No      Home Environment   Living Environment  Private residence    Living Arrangements  Spouse/significant other;Children    Type of [place] to enter    Entrance Stairs-Rails  [place]  Two level    Additional Comments  no equipment       Prior Function   Level of Independence  Independent    Vocation  Retired    Designer, industrial/product     Leisure  family, friends       Cognition   Overall Cognitive Status  Within Functional Limits for tasks assessed      Observation/Other Assessments   Focus on Therapeutic Outcomes (FOTO)   NT due to time constraints       Sensation   Light Touch  Appears Intact      Functional Tests   Functional tests  Squat;Step up;Single leg stance;Sit to Stand      Single Leg Stance   Comments  limited to <10 sec bilateral       Sit to Stand   Comments  [facility]       Posture/Postural Control   Posture/Postural Control  Postural limitations    Postural Limitations  Rounded Shoulders;Forward head      AROM   Left Hip External Rotation   50    Left Hip Internal Rotation   30       PROM   Overall PROM Comments  min pain with FADIR, good hip PROM overall with min pain end range      Strength   Right Hip Flexion  4+/5    Right Hip ABduction  3-/5    Left Hip Flexion  4/5    Left Hip ABduction  3-/5    Right/Left Knee  --   5/5      Palpation   Palpation comment  non tender L anterior, lateral hip, thigh       Transfers   Five time sit to stand comments   16 sec from mat table no UE assist                 Objective measurements completed on examination: See above findings.              PT Education - 06/11/19 0826    Education Details  PT/POC, hip pain OA vs AVN, HEP    Person(s) Educated  Patient    Methods  Explanation;Demonstration;Handout    Comprehension  Verbalized understanding;Returned demonstration;Verbal cues required       PT Short Term Goals - 06/11/19 1610      PT SHORT TERM GOAL #1   Title  Pt will be I with HEP for L hip strength, AROM    Time  4    Period  Weeks    Status  New    Target Date  07/08/19      PT SHORT TERM GOAL #2   Title  Pt will be more comfortable with sit to stand transfers  and light squats for functional mobility at home.    Time  4    Period  Weeks    Status  New    Target Date  07/08/19        PT Long Term Goals - 06/11/19 0833      PT LONG TERM GOAL #1   Title  Pt will be able to tolerate standing actiivity in her home with pain < 3/10 for housework, ADLs    Time  8    Period  Weeks    Status  New    Target Date  08/05/19      PT LONG TERM GOAL #2   Title  Pt will be able to sit ot stand without UE assist in 13 sec or less    Time  8    Period  Weeks    Status  New    Target Date  08/05/19      PT LONG TERM GOAL #3   Title  Pt will be able to perform HEP independently    Time  8    Period  Weeks    Status  New    Target Date  08/05/19      PT LONG TERM GOAL #4   Title  Pt will increase hip strength by 1/2 muscle grade in each LE to maximize functional strength     Time  8    Period  Weeks    Status  New    Target Date  08/05/19             Plan - 06/11/19 0836    Clinical Impression Statement  Patient presents for low complexity eval of L sided hip pain which is consistent with OA.  She is having an MRI this weekend to confirm a possible AVN.  She was late, limited assessment.  She presents with weakness and pain in L ant hip to thigh.  She will benefit from PT to improve functional mobility and strengthen hip regardless of diagnosis.    Personal Factors and Comorbidities  Comorbidity 3+    Comorbidities  kidney disease, HTN    Examination-Activity Limitations  Bed Mobility;Stairs;Stand;Sit;Sleep;Transfers;Squat;Locomotion Level    Examination-Participation Restrictions  Interpersonal Relationship;Cleaning;Laundry;Community Activity;Meal Prep    Stability/Clinical Decision Making  Stable/Uncomplicated    Clinical Decision Making  Low    Rehab Potential  Excellent    PT Frequency  Biweekly    PT Duration  8 weeks    PT Treatment/Interventions  ADLs/Self Care Home Management;Electrical Stimulation;Moist Heat;Ultrasound;Gait training;Functional mobility training;Therapeutic exercise;Balance training;Therapeutic activities;Neuromuscular re-education;Patient/family education;Manual techniques;Passive range of motion;Taping    PT Next Visit Plan  check HEP , NuStep, what did MRI show, try some standing hip ex    PT Home Exercise Plan  see pt instructions    Consulted and Agree with Plan of Care  Patient       Patient will benefit from skilled therapeutic intervention in order to improve the following deficits and impairments:  Decreased activity tolerance, Decreased range of motion, Decreased strength, Decreased balance, Decreased mobility, Difficulty walking, Impaired flexibility, Postural dysfunction, Pain, Impaired UE functional use, Increased fascial restricitons  Visit Diagnosis: Pain in left hip  Muscle weakness  (generalized)     Problem List Patient Active Problem List   Diagnosis Date Noted  . 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  . Tremor 03/02/2017  . Shaking 08/23/2016  . Excessive daytime sleepiness 08/23/2016  . Obstructive sleep apnea 08/23/2016    Keoni Risinger 06/11/2019, 1:37 PM  [facility] [address] [place], [place], [address] Phone: [phone]   Fax:  [phone]  Name: MISSOURI LAPAGLIA MRN: [id number] Date of Birth: 12/15/1955   Raeford Razor, PT 06/11/19 1:37 PM Phone: [phone] Fax: [phone]

## 2019-06-15 ENCOUNTER — Other Ambulatory Visit: Payer: 59

## 2019-06-17 ENCOUNTER — Ambulatory Visit: Payer: 59 | Admitting: Family Medicine

## 2019-07-05 ENCOUNTER — Ambulatory Visit
Admission: RE | Admit: 2019-07-05 | Discharge: 2019-07-05 | Disposition: A | Discharge status: Home / Self Care | Payer: 59 | Source: Ambulatory Visit | Attending: Family Medicine | Admitting: Family Medicine

## 2019-07-05 ENCOUNTER — Other Ambulatory Visit: Payer: Self-pay

## 2019-07-05 DIAGNOSIS — M8700 Idiopathic aseptic necrosis of unspecified bone: Secondary | ICD-10-CM

## 2019-07-08 ENCOUNTER — Encounter: Payer: Self-pay | Admitting: Family Medicine

## 2019-07-08 ENCOUNTER — Ambulatory Visit (INDEPENDENT_AMBULATORY_CARE_PROVIDER_SITE_OTHER): Payer: 59 | Admitting: Family Medicine

## 2019-07-08 ENCOUNTER — Other Ambulatory Visit: Payer: Self-pay

## 2019-07-08 VITALS — BP 110/82 | HR 84 | Ht 62.0 in | Wt 194.4 lb

## 2019-07-08 DIAGNOSIS — M8700 Idiopathic aseptic necrosis of unspecified bone: Secondary | ICD-10-CM | POA: Insufficient documentation

## 2019-07-08 DIAGNOSIS — K5730 Diverticulosis of large intestine without perforation or abscess without bleeding: Secondary | ICD-10-CM | POA: Insufficient documentation

## 2019-07-08 NOTE — Progress Notes (Signed)
MRI shows bilateral hip avascular necrosis.  We will discuss this in full detail your follow-up appointment with me on June 7.

## 2019-07-08 NOTE — Progress Notes (Signed)
I, Wendy Poet, LAT, ATC, am serving as scribe for Dr. Lynne Leader.  Colleen Obrien is a 64 y.o. female who presents to [place] at [facility] today for f/u of L hip pain and L hip MRI review.  She was last seen by Dr. Georgina Snell on 05/14/19 w/ c/o L thigh and groin pain and had a L hip injection.  She was referred to outpatient PT and has completed one session.  Since her last visit, pt reports that her L hip is about the same w/ pain in her L groin, running into her L thigh.  She has been doing her HEP as prescribed by PT.  Diagnostic testing: L hip XR- 05/14/19; L hip MRI- 07/05/19   Pertinent review of systems: No fevers or chills  Relevant historical information: Lupus, CKD 4 awaiting kidney transplant   Exam:  BP 110/82 (BP Location: Right Arm, Patient Position: Sitting, Cuff Size: Large)   Pulse 84   Ht 5\' 2"  (1.575 m)   Wt 194 lb 6.4 oz (88.2 kg)   SpO2 97%   BMI 35.56 kg/m  General: Well Developed, well nourished, and in no acute distress.   MSK: Left hip decreased motion normal gait.    Lab and Radiology Results No results found for this or any previous visit (from the past 72 hour(s)). MR HIP LEFT WO CONTRAST  Result Date: 07/07/2019 CLINICAL DATA:  Left hip pain peri gout EXAM: MR OF THE LEFT HIP WITHOUT CONTRAST TECHNIQUE: Multiplanar, multisequence MR imaging was performed. No intravenous contrast was administered. COMPARISON:  Radiographs 05/14/2019 FINDINGS: Bones: MRI confirms bilateral hip avascular necrosis. The region of involvement is larger on the left than the right, encompassing a substantial portion of the volume and the articular surface of the left femoral head, with evidence of chronic AVN but also with abnormal edema within the region of AVN and within the surrounding femoral head extending down into the femoral neck compatible with active process which may be due to acute superimposed necrosis or early collapse. There is a  suggestion of mild contour flattening of the left femoral head along the AVN suggesting very early levels of collapse or fracture. On the right side, there is evidence of chronic avascular necrosis of the femoral head with revascularization, and no active edema or specific acute process involving the right hip. Red marrow redistribution is present. This is nonspecific can be associated with a variety of conditions including chronic anemia, chronic heart failure, myelofibrosis, infiltrative marrow disease, response to infection, and other conditions. Articular cartilage and labrum Articular cartilage: Mild thinning of articular cartilage in both hips, likely degenerative. Labrum:  No large tear or paralabral cyst seen. Joint or bursal effusion Joint effusion: Small to moderate left hip joint effusion is present. Bursae: No regional bursitis. Muscles and tendons Muscles and tendons:  Unremarkable Other findings Miscellaneous: Bilateral intramural uterine fibroids, largest about 3.1 cm in diameter in the right lower uterine body. Sigmoid colon diverticulosis. IMPRESSION: 1. Bilateral hip avascular necrosis, chronic on the right but with chronic components and also with with abnormal edema within the left femoral head extending down into the femoral neck compatible with active process which may be due to early collapse versus acute component of AVN. 2. Small to moderate left hip joint effusion. 3. Mild degenerative articular cartilage thinning in both hips. 4. Red marrow redistribution is present. This is nonspecific can be associated with a variety of conditions including chronic anemia, chronic heart failure, myelofibrosis,  infiltrative marrow disease, response to infection, and other conditions. 5. Bilateral intramural uterine fibroids. 6. Sigmoid colon diverticulosis. Electronically Signed   By: Van Clines M.D.   On: 07/07/2019 14:19    I, Lynne Leader, personally (independently) visualized and performed  the interpretation of the images attached in this note.    Assessment and Plan: 64 y.o. female with left hip pain due to avascular necrosis.  Concern for early collapse based on MRI findings.  Abnormal red marrow findings likely due to her CKD 4 and lupus.  Discussed findings with patient.  She notes that in the past she was on bisphosphonates for decreased bone mineral density and did not tolerate them.  Doubtful that bisphosphonates would help much in her situation for avascular necrosis based on information.  At this point I think her best bet is to discuss the case with orthopedic surgery for surgical planning.  Recheck back with me as needed.   PDMP not reviewed this encounter. Orders Placed This Encounter  Procedures  . Ambulatory referral to Orthopedic Surgery    Referral Priority:   Routine    Referral Type:   Surgical    Referral Reason:   Specialty Services Required    Requested Specialty:   Orthopedic Surgery    Number of Visits Requested:   1   No orders of the defined types were placed in this encounter.    Discussed warning signs or symptoms. Please see discharge instructions. Patient expresses understanding.   The above documentation has been reviewed and is accurate and complete Lynne Leader, M.D.  Total encounter time 20 minutes including charting time date of service. MRI findings treatment plan and options.

## 2019-07-08 NOTE — Patient Instructions (Signed)
Thank you for coming in today. You should hear from orthopedics soon.

## 2019-07-10 ENCOUNTER — Other Ambulatory Visit (HOSPITAL_COMMUNITY): Payer: Self-pay | Admitting: Nephrology

## 2019-07-11 ENCOUNTER — Other Ambulatory Visit (HOSPITAL_COMMUNITY): Payer: Self-pay | Admitting: Nephrology

## 2019-07-11 DIAGNOSIS — Z01810 Encounter for preprocedural cardiovascular examination: Secondary | ICD-10-CM

## 2019-07-23 ENCOUNTER — Telehealth (HOSPITAL_COMMUNITY): Payer: Self-pay | Admitting: *Deleted

## 2019-07-23 ENCOUNTER — Encounter (HOSPITAL_COMMUNITY): Payer: Self-pay | Admitting: *Deleted

## 2019-07-23 ENCOUNTER — Encounter: Payer: Self-pay | Admitting: Orthopaedic Surgery

## 2019-07-23 ENCOUNTER — Ambulatory Visit (INDEPENDENT_AMBULATORY_CARE_PROVIDER_SITE_OTHER): Payer: 59 | Admitting: Orthopaedic Surgery

## 2019-07-23 VITALS — Ht 63.0 in | Wt 196.0 lb

## 2019-07-23 DIAGNOSIS — M87052 Idiopathic aseptic necrosis of left femur: Secondary | ICD-10-CM | POA: Diagnosis not present

## 2019-07-23 NOTE — Telephone Encounter (Signed)
Patient given detailed instructions per Myocardial Perfusion Study Information Sheet for the test on 07/25/2019 at 1045. Patient notified to arrive 15 minutes early and that it is imperative to arrive on time for appointment to keep from having the test rescheduled.  If you need to cancel or reschedule your appointment, please call the office within 24 hours of your appointment. . Patient verbalized understanding.[place] Mychart letter sent with instructions

## 2019-07-23 NOTE — Progress Notes (Signed)
Office Visit Note   Patient: Colleen Obrien           Date of Birth: March 10, 1955           MRN: [id number] Visit Date: 07/23/2019              Requested by: Gregor Hams, MD [address],  [place] [address] PCP: Carol Ada, MD   Assessment & Plan: Visit Diagnoses:  1. Avascular necrosis of bone of left hip (HCC)     Plan: Impression is left hip avascular necrosis.  I reviewed the x-rays and MRI with the patient in detail and they do show early femoral head collapse.  Based on discussion of treatment options and their likelihood to give significant and meaningful pain relief and restore quality of life the patient has elected to proceed with a left total hip replacement in the near future.  Risk benefits rehab recovery reviewed in detail.  She is scheduled for stress test later this week.  We look forward to treating her in the operating room.  Follow-Up Instructions: Return if symptoms worsen or fail to improve.   Orders:  No orders of the defined types were placed in this encounter.  No orders of the defined types were placed in this encounter.     Procedures: No procedures performed   Clinical Data: No additional findings.   Subjective: Chief Complaint  Patient presents with  . Left Hip - Pain    Colleen Obrien is a 64 year old female with 3 months of severe left hip and groin pain with an MRI that showed extensive avascular necrosis.  She has groin pain that is very functionally limiting then prevents her from standing and walking for any prolonged period of time.  She has a severe limp as well.  She was previously on chronic prednisone for lupus.  Denies a history of DVTs.   Review of Systems  Constitutional: Negative.   HENT: Negative.   Eyes: Negative.   Respiratory: Negative.   Cardiovascular: Negative.   Endocrine: Negative.   Musculoskeletal: Negative.   Neurological: Negative.   Hematological: Negative.   Psychiatric/Behavioral:  Negative.   All other systems reviewed and are negative.    Objective: Vital Signs: Ht 5\' 3"  (1.6 m)   Wt 196 lb (88.9 kg)   BMI 34.72 kg/m   Physical Exam Vitals and nursing note reviewed.  Constitutional:      Appearance: She is well-developed.  HENT:     Head: Normocephalic and atraumatic.  Pulmonary:     Effort: Pulmonary effort is normal.  Abdominal:     Palpations: Abdomen is soft.  Musculoskeletal:     Cervical back: Neck supple.  Skin:    General: Skin is warm.     Capillary Refill: Capillary refill takes less than 2 seconds.  Neurological:     Mental Status: She is alert and oriented to person, place, and time.  Psychiatric:        Behavior: Behavior normal.        Thought Content: Thought content normal.        Judgment: Judgment normal.     Ortho Exam Left hip shows painful range of motion and an antalgic gait. Specialty Comments:  No specialty comments available.  Imaging: No results found.   PMFS History: Patient Active Problem List   Diagnosis Date Noted  . AVN (avascular necrosis of bone) ([place]) 07/08/2019  . Diverticulosis of colon without hemorrhage 07/08/2019  . Primary narcolepsy  with cataplexy 03/02/2017  . Tremor 03/02/2017  . Shaking 08/23/2016  . Excessive daytime sleepiness 08/23/2016  . Obstructive sleep apnea 08/23/2016   Past Medical History:  Diagnosis Date  . Asthma   . Gout   . Hypertension   . Lupus ([place])   . Narcolepsy   . Sleep apnea     Family History  Problem Relation Age of Onset  . Hypertension Mother   . Diabetes Mother   . Dementia Father   . Hypertension Father   . Hyperlipidemia Father   . Diabetes Paternal Grandmother   . Alcohol abuse Paternal Grandfather     Past Surgical History:  Procedure Laterality Date  . CESAREAN SECTION    . TUBAL LIGATION     Social History   Occupational History  . Occupation: [employer]ote[employer]  Tobacco Use  . Smoking status: Never Smoker  . Smokeless tobacco:  Never Used  Vaping Use  . Vaping Use: Never used  Substance and Sexual Activity  . Alcohol use: No  . Drug use: No  . Sexual activity: Not on file

## 2019-07-24 ENCOUNTER — Telehealth (HOSPITAL_COMMUNITY): Payer: Self-pay | Admitting: Nephrology

## 2019-07-24 NOTE — Telephone Encounter (Signed)
Per Ciro Backer in [place] at [facility] patient does require PA#.  She kindly reached out to the office of Dr. Joelyn Oms to obtain PA#. They as of 07/24/2019 have not received # from [facility]  Patient was given the opportunity to have the test in the office and be done as a SELF PAY but patient declined due to the estimate of the test.  We cancelled the Myoview and will call patient once we hear from the ordering Dr. Gabriel Carina.  I also asked patient to follow up with them also. Order will be deferred for 30 days due to "Pending Autho#".

## 2019-07-25 ENCOUNTER — Encounter (HOSPITAL_COMMUNITY): Payer: 59

## 2019-07-26 ENCOUNTER — Encounter: Payer: 59 | Attending: Nephrology | Admitting: Dietician

## 2019-07-26 ENCOUNTER — Encounter: Payer: Self-pay | Admitting: Dietician

## 2019-07-26 ENCOUNTER — Other Ambulatory Visit: Payer: Self-pay

## 2019-07-26 DIAGNOSIS — N184 Chronic kidney disease, stage 4 (severe): Secondary | ICD-10-CM | POA: Insufficient documentation

## 2019-07-26 NOTE — Progress Notes (Signed)
Medical Nutrition Therapy:  Appt start time: 0915 end time:  1030.   Assessment:  Primary concerns today: Patient is here today with her husband.  She would like to learn to eat better and states that she needs a low sodium and low protein diet for her kidneys. Patient referred for CKD stage 4, HTN, and obesity.   History includes HTN, CKD stage 4, secondary hyperparathyroidism, lupus nephritis, gout, asthma, OSA and uses c-pap when she can use without feeling suffocated.   Labs noted 06/04/2019- BUN 67, Creatinine 4.18, Potassium 4.4, Phosphorous 4.6, eGFR 12; vitamin D 29.8 and PTH 153 05/09/2019 Recent Prednisone for Lupus.  Other medications noted   Patient lives with her husband and her son.  She does most of the shopping and cooking.  She is retired from [employer]. They eat out on weekends ([employer], [place], [place], [place], [place], [place]  Preferred Learning Style:   No preference indicated   Learning Readiness:   Ready  Change in progress   MEDICATIONS: see list to include vitamin D   DIETARY INTAKE:  Usual eating pattern includes 2-3 meals and 2 snacks per day.  Avoided foods include red meat, limited dairy.  Added salt.  24-hr recall:  B ( AM): flavored Cheerios, almond milk or 2% milk OR banana and PB sandwich on [employer] bread OR fruit  Snk ( AM): occasional LS potato chips or cheetos  L ( PM): skips often as she eats late OR 1/2 [place] or ham sandwich with fruit and chips Snk ( PM): apple or chips or cookies  D ( PM): meat (chicken, fish, [place]), vegetable, starch Snk ( PM): occasional fruit popsicle Beverages: water, flavored water, occasional sprite or gingerale or cheer wine, occasional grape or cherry juice  Usual physical activity: "Hip has gone out so I do not walk as much as I used to."  PT exercises lying down  Estimated energy needs: 1500 calories 50 g protein   Progress Towards Goal(s):  In progress.   Nutritional Diagnosis:  NB-1.1 Food and  nutrition-related knowledge deficit As related to renal diet.  As evidenced by diet hx and patient report.    Intervention:  Nutrition education related to a low protein, low sodium, low phosphorous renal diet.  Discussed to limit potassium based on labs/MD recommendation and provided information of foods allowed and not recommended.  Discussed label reading and resources.  Plan: Beverages:  Water, cranberry juice, lemonade, sweet tea, occasional sprite  Follow a low protein, low sodium diet Read labels Avoid added phosphorous.  Any Phos... on the ingredient list. Avoid potassium chloride on the ingredient list. Follow a low potassium diet if your blood potassium is high.  Resources: [facility]   Teaching Method Utilized:  Visual Auditory  Handouts given during visit include:  NKD diet poster for those not on dialysis  Barriers to learning/adherence to lifestyle change: health  Demonstrated degree of understanding via:  Teach Back   Monitoring/Evaluation:  Dietary intake, exercise, label reading, and body weight prn.

## 2019-07-26 NOTE — Patient Instructions (Addendum)
Beverages:  Water, cranberry juice, lemonade, sweet tea, occasional sprite  Follow a low protein, low sodium diet Read labels Avoid added phosphorous.  Any Phos... on the ingredient list. Avoid potassium chloride on the ingredient list. Follow a low potassium diet if your blood potassium is high.  Resources: [facility]

## 2019-07-30 ENCOUNTER — Other Ambulatory Visit: Payer: Self-pay

## 2019-08-20 NOTE — Progress Notes (Signed)
[id number] [address], [place] [address] Phone: [phone] Fax: [phone]      Your procedure is scheduled on Monday, July 26th.  Report to [facility] Main Entrance "A" at 5:30 A.M., and check in at the Admitting office.  Call this number if you have problems the morning of surgery:  [phone]  Call [phone] if you have any questions prior to your surgery date Monday-Friday 8am-4pm    Remember:  Do not eat after midnight the night before your surgery  You may drink clear liquids until 4:30 AM the morning of your surgery.   Clear liquids allowed are: Water, Non-Citrus Juices (without pulp), Carbonated Beverages, Clear Tea, Black Coffee Only, and Gatorade    Take these medicines the morning of surgery with A SIP OF WATER   Tylenol - if needed  Albuterol inhaler - if needed (bring with you on the day of surgery)  Symbicort inhaler - if needed (bring with you on the day of surgery)  Plaquenil  Metoprolol  Cellcept  Febuxostat  As of today, STOP taking any Aspirin (unless otherwise instructed by your surgeon) Aleve, Naproxen, Ibuprofen, Motrin, Advil, Goody's, BC's, all herbal medications, fish oil, and all vitamins.                      Do not wear jewelry, make up, or nail polish            Do not wear lotions, powders, perfumes, or deodorant.            Do not shave 48 hours prior to surgery.              Do not bring valuables to the hospital.            [facility] is not responsible for any belongings or valuables.  Do NOT Smoke (Tobacco/Vaping) or drink Alcohol 24 hours prior to your procedure If you use a CPAP at night, you may bring all equipment for your overnight stay.   Contacts, glasses, dentures or bridgework may not be worn into surgery.      For patients admitted to the hospital, discharge time will be determined by your treatment team.   Patients discharged the day of surgery will  not be allowed to drive home, and someone needs to stay with them for 24 hours.    Special instructions:   [facility]- Preparing For Surgery  Before surgery, you can play an important role. Because skin is not sterile, your skin needs to be as free of germs as possible. You can reduce the number of germs on your skin by washing with CHG (chlorahexidine gluconate) Soap before surgery.  CHG is an antiseptic cleaner which kills germs and bonds with the skin to continue killing germs even after washing.    Oral Hygiene is also important to reduce your risk of infection.  Remember - BRUSH YOUR TEETH THE MORNING OF SURGERY WITH YOUR REGULAR TOOTHPASTE  Please do not use if you have an allergy to CHG or antibacterial soaps. If your skin becomes reddened/irritated stop using the CHG.  Do not shave (including legs and underarms) for at least 48 hours prior to first CHG shower. It is OK to shave your face.  Please follow these instructions carefully.   1. Shower the NIGHT BEFORE SURGERY and the MORNING OF SURGERY with CHG Soap.   2. If you chose to wash your hair,  wash your hair first as usual with your normal shampoo.  3. After you shampoo, rinse your hair and body thoroughly to remove the shampoo.  4. Use CHG as you would any other liquid soap. You can apply CHG directly to the skin and wash gently with a scrungie or a clean washcloth.   5. Apply the CHG Soap to your body ONLY FROM THE NECK DOWN.  Do not use on open wounds or open sores. Avoid contact with your eyes, ears, mouth and genitals (private parts). Wash Face and genitals (private parts)  with your normal soap.   6. Wash thoroughly, paying special attention to the area where your surgery will be performed.  7. Thoroughly rinse your body with warm water from the neck down.  8. DO NOT shower/wash with your normal soap after using and rinsing off the CHG Soap.  9. Pat yourself dry with a CLEAN TOWEL.  10. Wear CLEAN PAJAMAS to bed  the night before surgery  11. Place CLEAN SHEETS on your bed the night of your first shower and DO NOT SLEEP WITH PETS.   Day of Surgery: Wear Clean/Comfortable clothing the morning of surgery Do not apply any deodorants/lotions.   Remember to brush your teeth WITH YOUR REGULAR TOOTHPASTE.   Please read over the following fact sheets that you were given.

## 2019-08-21 ENCOUNTER — Encounter (HOSPITAL_COMMUNITY): Payer: Self-pay

## 2019-08-21 ENCOUNTER — Ambulatory Visit (HOSPITAL_COMMUNITY)
Admission: RE | Admit: 2019-08-21 | Discharge: 2019-08-21 | Disposition: A | Discharge status: Home / Self Care | Payer: 59 | Source: Ambulatory Visit | Attending: Physician Assistant | Admitting: Physician Assistant

## 2019-08-21 ENCOUNTER — Other Ambulatory Visit: Payer: Self-pay

## 2019-08-21 ENCOUNTER — Encounter (HOSPITAL_COMMUNITY)
Admission: RE | Admit: 2019-08-21 | Discharge: 2019-08-21 | Disposition: A | Discharge status: Home / Self Care | Payer: 59 | Source: Ambulatory Visit | Attending: Orthopaedic Surgery | Admitting: Orthopaedic Surgery

## 2019-08-21 DIAGNOSIS — M1612 Unilateral primary osteoarthritis, left hip: Secondary | ICD-10-CM | POA: Diagnosis present

## 2019-08-21 HISTORY — DX: Unspecified osteoarthritis, unspecified site: M19.90

## 2019-08-21 HISTORY — DX: Anemia, unspecified: D64.9

## 2019-08-21 HISTORY — DX: Dyspnea, unspecified: R06.00

## 2019-08-21 HISTORY — DX: Disease of blood and blood-forming organs, unspecified: D75.9

## 2019-08-21 HISTORY — DX: Gastro-esophageal reflux disease without esophagitis: K21.9

## 2019-08-21 HISTORY — DX: Pneumonia, unspecified organism: J18.9

## 2019-08-21 LAB — URINALYSIS, ROUTINE W REFLEX MICROSCOPIC
Bilirubin Urine: NEGATIVE
Glucose, UA: NEGATIVE mg/dL
Hgb urine dipstick: NEGATIVE
Ketones, ur: NEGATIVE mg/dL
Leukocytes,Ua: NEGATIVE
Nitrite: NEGATIVE
Protein, ur: NEGATIVE mg/dL
Specific Gravity, Urine: 1.008 (ref 1.005–1.030)
pH: 6 (ref 5.0–8.0)

## 2019-08-21 LAB — CBC WITH DIFFERENTIAL/PLATELET
Abs Immature Granulocytes: 0.02 10*3/uL (ref 0.00–0.07)
Basophils Absolute: 0.1 10*3/uL (ref 0.0–0.1)
Basophils Relative: 1 %
Eosinophils Absolute: 0.3 10*3/uL (ref 0.0–0.5)
Eosinophils Relative: 4 %
HCT: 37.4 % (ref 36.0–46.0)
Hemoglobin: 11.2 g/dL — ABNORMAL LOW (ref 12.0–15.0)
Immature Granulocytes: 0 %
Lymphocytes Relative: 12 %
Lymphs Abs: 0.7 10*3/uL (ref 0.7–4.0)
MCH: 26 pg (ref 26.0–34.0)
MCHC: 29.9 g/dL — ABNORMAL LOW (ref 30.0–36.0)
MCV: 87 fL (ref 80.0–100.0)
Monocytes Absolute: 0.6 10*3/uL (ref 0.1–1.0)
Monocytes Relative: 10 %
Neutro Abs: 4.4 10*3/uL (ref 1.7–7.7)
Neutrophils Relative %: 73 %
Platelets: 283 10*3/uL (ref 150–400)
RBC: 4.3 MIL/uL (ref 3.87–5.11)
RDW: 14.2 % (ref 11.5–15.5)
WBC: 6.2 10*3/uL (ref 4.0–10.5)
nRBC: 0 % (ref 0.0–0.2)

## 2019-08-21 LAB — SURGICAL PCR SCREEN
MRSA, PCR: NEGATIVE
Staphylococcus aureus: POSITIVE — AB

## 2019-08-21 LAB — COMPREHENSIVE METABOLIC PANEL
ALT: 15 U/L (ref 0–44)
AST: 19 U/L (ref 15–41)
Albumin: 3.5 g/dL (ref 3.5–5.0)
Alkaline Phosphatase: 50 U/L (ref 38–126)
Anion gap: 10 (ref 5–15)
BUN: 47 mg/dL — ABNORMAL HIGH (ref 8–23)
CO2: 20 mmol/L — ABNORMAL LOW (ref 22–32)
Calcium: 9.7 mg/dL (ref 8.9–10.3)
Chloride: 106 mmol/L (ref 98–111)
Creatinine, Ser: 3.97 mg/dL — ABNORMAL HIGH (ref 0.44–1.00)
GFR calc Af Amer: 13 mL/min — ABNORMAL LOW (ref >60)
GFR calc non Af Amer: 11 mL/min — ABNORMAL LOW (ref >60)
Glucose, Bld: 104 mg/dL — ABNORMAL HIGH (ref 70–99)
Potassium: 4.9 mmol/L (ref 3.5–5.1)
Sodium: 136 mmol/L (ref 135–145)
Total Bilirubin: 0.6 mg/dL (ref 0.3–1.2)
Total Protein: 7 g/dL (ref 6.5–8.1)

## 2019-08-21 LAB — PREALBUMIN: Prealbumin: 24.6 mg/dL (ref 18–38)

## 2019-08-21 LAB — PROTIME-INR
INR: 1 (ref 0.8–1.2)
Prothrombin Time: 12.6 seconds (ref 11.4–15.2)

## 2019-08-21 LAB — APTT: aPTT: 33 seconds (ref 24–36)

## 2019-08-21 NOTE — Progress Notes (Signed)
[id number] [address], [place] [address] Phone: [phone] Fax: [phone]      Your procedure is scheduled on Monday, July 26th.  Report to [facility] Main Entrance "A" at 5:30 A.M., and check in at the Admitting office.  Call this number if you have problems the morning of surgery:  [phone]  Call [phone] if you have any questions prior to your surgery date Monday-Friday 8am-4pm    Remember:  Do not eat after midnight the night before your surgery  You may drink clear liquids until 4:15 AM the morning of your surgery.   Clear liquids allowed are: Water, Non-Citrus Juices (without pulp), Carbonated Beverages, Clear Tea, Black Coffee Only, and Gatorade   Enhanced Recovery after Surgery for Orthopedics Enhanced Recovery after Surgery is a protocol used to improve the stress on your body and your recovery after surgery.  Patient Instructions  . The night before surgery:  o No food after midnight. ONLY clear liquids after midnight  .  Marland Kitchen The day of surgery (if you do NOT have diabetes):  o Drink ONE (1) Pre-Surgery Clear Ensure by 04:15 am the morning of surgery   o This drink was given to you during your hospital  pre-op appointment visit. o Nothing else to drink after completing the  Pre-Surgery Clear Ensure.  . The day of surgery (if you have diabetes):         If you have questions, please contact your surgeon's office.     Take these medicines the morning of surgery with A SIP OF WATER   Tylenol - if needed  Albuterol inhaler - if needed (bring with you on the day of surgery)  Symbicort inhaler - if needed (bring with you on the day of surgery)  Plaquenil  Metoprolol  Cellcept  Febuxostat  As of today, STOP taking any Aspirin (unless otherwise instructed by your surgeon) Aleve, Naproxen, Ibuprofen, Motrin, Advil, Goody's, BC's, all herbal medications, fish oil, and all vitamins.                       Do not wear jewelry, make up, or nail polish            Do not wear lotions, powders, perfumes, or deodorant.            Do not shave 48 hours prior to surgery.              Do not bring valuables to the hospital.            [facility] is not responsible for any belongings or valuables.  Do NOT Smoke (Tobacco/Vaping) or drink Alcohol 24 hours prior to your procedure If you use a CPAP at night, you may bring all equipment for your overnight stay.   Contacts, glasses, dentures or bridgework may not be worn into surgery.      For patients admitted to the hospital, discharge time will be determined by your treatment team.   Patients discharged the day of surgery will not be allowed to drive home, and someone needs to stay with them for 24 hours.    Special instructions:   [facility]- Preparing For Surgery  Before surgery, you can play an important role. Because skin is not sterile, your skin needs to be as free of germs as possible. You can reduce the number of germs on your skin by washing with CHG (chlorahexidine gluconate)  Soap before surgery.  CHG is an antiseptic cleaner which kills germs and bonds with the skin to continue killing germs even after washing.    Oral Hygiene is also important to reduce your risk of infection.  Remember - BRUSH YOUR TEETH THE MORNING OF SURGERY WITH YOUR REGULAR TOOTHPASTE  Please do not use if you have an allergy to CHG or antibacterial soaps. If your skin becomes reddened/irritated stop using the CHG.  Do not shave (including legs and underarms) for at least 48 hours prior to first CHG shower. It is OK to shave your face.  Please follow these instructions carefully.   1. Shower the NIGHT BEFORE SURGERY and the MORNING OF SURGERY with CHG Soap.   2. If you chose to wash your hair, wash your hair first as usual with your normal shampoo.  3. After you shampoo, rinse your hair and body thoroughly to remove the shampoo.  4. Use CHG as  you would any other liquid soap. You can apply CHG directly to the skin and wash gently with a scrungie or a clean washcloth.   5. Apply the CHG Soap to your body ONLY FROM THE NECK DOWN.  Do not use on open wounds or open sores. Avoid contact with your eyes, ears, mouth and genitals (private parts). Wash Face and genitals (private parts)  with your normal soap.   6. Wash thoroughly, paying special attention to the area where your surgery will be performed.  7. Thoroughly rinse your body with warm water from the neck down.  8. DO NOT shower/wash with your normal soap after using and rinsing off the CHG Soap.  9. Pat yourself dry with a CLEAN TOWEL.  10. Wear CLEAN PAJAMAS to bed the night before surgery  11. Place CLEAN SHEETS on your bed the night of your first shower and DO NOT SLEEP WITH PETS.   Day of Surgery: Wear Clean/Comfortable clothing the morning of surgery Do not apply any deodorants/lotions.   Remember to brush your teeth WITH YOUR REGULAR TOOTHPASTE.   Please read over the following fact sheets that you were given.

## 2019-08-21 NOTE — Progress Notes (Signed)
FYI- poor renal function

## 2019-08-21 NOTE — Progress Notes (Signed)
PCP - Carol Ada, MD Cardiologist - Denies  PPM/ICD - Denies  Chest x-ray - 08/21/19 EKG - 08/21/19 Stress Test - 06/05/19 C.E ECHO - 06/05/18 C[facility]E Cardiac Cath - Denies  Sleep Study - Yes, positive for OSA CPAP - Yes  Patient denies being diabetic.  Blood Thinner Instructions: N/A Aspirin Instructions: N/A  ERAS Protcol - Yes PRE-SURGERY Ensure or G2- Ensure given  COVID TEST- 08/23/19   Anesthesia review: Yes, review Stress ECHO  Patient denies shortness of breath, fever, cough and chest pain at PAT appointment   All instructions explained to the patient, with a verbal understanding of the material. Patient agrees to go over the instructions while at home for a better understanding. Patient also instructed to self quarantine after being tested for COVID-19. The opportunity to ask questions was provided.

## 2019-08-22 NOTE — Progress Notes (Signed)
Anesthesia Chart Review:  Case: [id number] Date/Time: 08/26/19 0700   Procedure: LEFT TOTAL HIP ARTHROPLASTY ANTERIOR APPROACH (Left Hip)   Anesthesia type: Spinal   Pre-op diagnosis: Avascular Necrosis Left Hip   Location: [facility] OR ROOM 04 / [facility] OR   Surgeons: Leandrew Koyanagi, MD      DISCUSSION: Patient is a 64 year old female scheduled for the above procedure.  History includes never smoker, HTN, lupus (SLE, diagnosed 1982), narcolepsy, CKD stage IV (lupus nephritis, diagnosed 2006), OSA (CPAP), GERD, dyspnea, asthma, anemia, arthritis, gout. BMI is consistent with obesity.   Known CKD. Most recent trends include: 08/21/19: Creatinine 3.97. BUN 47. eGFR 13. Ca 9.7. 07/09/19 ([facility]): Creatinine 4.05. BUN 71. eGFR 13. 05/14/19 ([facility]): Creatinine 4.86. BUN 67. eGFR 10. 05/10/19 ([facility]): 4.18. BUN 67. eGFR 12. ("compared to 3.2-3.4 over the past several months").  05/14/19 office note from nephrologist Dr. Joelyn Oms is scanned under Media tab. She has had some increase in her Creatinine over the past year. EGFR was 10 with Calcium 11.1. No acidosis or significant azotemia. He arranged information session regarding dialysis sounds like she is leaning more towards peritoneal dialysis. I called and spoke with Dr. Joelyn Oms about surgery plans, 08/21/19 lab results, and to inquire about pending stress test (waiting approval from insurance).  He agreed that renal function appears stable, so primary recommendation is to avoid nephrotoxic drugs (ie, Toradol, etc) in the perioperative period. In regards to stress testing, this was only ordered because [facility] requires periodic updating of stress test to stay eligible for the renal transplant list. This was not ordered for any acute symptoms. Her previous stress echo was non-ischemic in 06/2018 ([facility]) and preoperative EKG on 08/21/19 showed NSR. She denied chest pain and SOB.   Reviewed my discussion with Dr. Joelyn Oms with anesthesiologist Criss Rosales, DO and also sent staff  message with update to Dr. Erlinda Hong. Preoperative COVID-19 test is scheduled for 08/23/2019. Anesthesia team to evaluate on the day of surgery.    VS: BP (!) 120/93   Pulse 79   Temp 36.4 C (Oral)   Resp 18   Ht '5\' 3"'$  (1.6 m)   Wt 87.7 kg   SpO2 99%   BMI 34.27 kg/m    PROVIDERS: Carol Ada, MD his PCP - Pearson Grippe, MD is nephrologist ([place]). She was also seen on 08/13/19 by Marian Sorrow, MD [facility] [place], see Care Everywhere) for a second opinion regarding any alternatives to dialysis and transplant, but agreed that currently these were her only current options for ESRD. She has also had renal transplant evaluation through [facility]. Alphonsa Overall, MD is rheumatologist ([place])   LABS: Preoperative labs noted. See DISCUSSION. Renal function appears stable for last several months.  (all labs ordered are listed, but only abnormal results are displayed)  Labs Reviewed  SURGICAL PCR SCREEN - Abnormal; Notable for the following components:      Result Value   Staphylococcus aureus POSITIVE (*)    All other components within normal limits  CBC WITH DIFFERENTIAL/PLATELET - Abnormal; Notable for the following components:   Hemoglobin 11.2 (*)    MCHC 29.9 (*)    All other components within normal limits  COMPREHENSIVE METABOLIC PANEL - Abnormal; Notable for the following components:   CO2 20 (*)    Glucose, Bld 104 (*)    BUN 47 (*)    Creatinine, Ser 3.97 (*)    GFR calc non Af Amer 11 (*)  GFR calc Af Amer 13 (*)    All other components within normal limits  URINALYSIS, ROUTINE W REFLEX MICROSCOPIC - Abnormal; Notable for the following components:   Color, Urine STRAW (*)    All other components within normal limits  PREALBUMIN  APTT  PROTIME-INR  TYPE AND SCREEN     IMAGES: CXR 08/21/19: FINDINGS: The heart size and mediastinal contours are within normal limits. Stable hiatal hernia. Both lungs are clear. The  visualized skeletal structures are unremarkable. IMPRESSION: No active cardiopulmonary disease.   EKG: 08/21/19: NSR   CV: Stress Echo 06/05/18 ([facility]): NORMAL STRESS TEST. NORMAL RESTING STUDY WITH NO WALL MOTION ABNORMALITIES  AT REST AND PEAK STRESS.  NO DOPPLER PERFORMED FOR VALVULAR REGURGITATION  NO DOPPLER PERFORMED FOR VALVULAR STENOSIS  Maximum workload of 4.60 METs was achieved during exercise.  RESTING HYPERTENSION - EXAGGERATED RESPONSE    Echo 01/08/14 [facility]): Normal left ventricular ejection fraction (59-27%)    Diastolic left ventricular dysfunction    Dilated left atrium    Pulmonary hypertension (mild - RVSP 40-45 mmHg)    Normal right ventricular contractile performance    Tricuspid regurgitation (mild)    Pericardial effusion (small)    Past Medical History:  Diagnosis Date  . Anemia   . Arthritis   . Asthma   . Blood dyscrasia    systemic lupus  . Chronic kidney disease   . Dyspnea   . GERD (gastroesophageal reflux disease)   . Gout   . Hypertension   . Lupus ([place])   . Narcolepsy   . Pneumonia   . Sleep apnea     Past Surgical History:  Procedure Laterality Date  . CESAREAN SECTION    . TUBAL LIGATION      MEDICATIONS: . acetaminophen (TYLENOL) 500 MG tablet  . albuterol (PROVENTIL HFA;VENTOLIN HFA) 108 (90 BASE) MCG/ACT inhaler  . amphetamine-dextroamphetamine (ADDERALL) 5 MG tablet  . budesonide-formoterol (SYMBICORT) 80-4.5 MCG/ACT inhaler  . Cholecalciferol (VITAMIN D3) 50 MCG (2000 UT) TABS  . colchicine 0.6 MG tablet  . denosumab (PROLIA) 60 MG/ML SOLN injection  . Febuxostat 80 MG TABS  . furosemide (LASIX) 80 MG tablet  . hydroxychloroquine (PLAQUENIL) 200 MG tablet  . metoprolol succinate (TOPROL-XL) 50 MG 24 hr tablet  . mycophenolate (CELLCEPT) 500 MG tablet  . spironolactone (ALDACTONE) 25 MG tablet   No current facility-administered medications for this encounter.    Myra Gianotti,  PA-C Surgical Short Stay/Anesthesiology [facility] Phone [phone] [facility] Phone [phone] 08/22/2019 3:33 PM

## 2019-08-22 NOTE — Anesthesia Preprocedure Evaluation (Addendum)
Anesthesia Evaluation  Patient identified by MRN, date of birth, ID band Patient awake    Reviewed: Allergy & Precautions, H&P , NPO status , Patient's Chart, lab work & pertinent test results  Airway Mallampati: II  TM Distance: >3 FB Neck ROM: Full    Dental no notable dental hx. (+) Teeth Intact, Dental Advisory Given   Pulmonary asthma ,    Pulmonary exam normal breath sounds clear to auscultation       Cardiovascular Exercise Tolerance: Good hypertension, Pt. on medications and Pt. on home beta blockers  Rhythm:Regular Rate:Normal     Neuro/Psych negative neurological ROS  negative psych ROS   GI/Hepatic Neg liver ROS, GERD  ,  Endo/Other  negative endocrine ROS  Renal/GU Renal InsufficiencyRenal disease  negative genitourinary   Musculoskeletal  (+) Arthritis , Osteoarthritis,    Abdominal   Peds  Hematology  (+) Blood dyscrasia, anemia ,   Anesthesia Other Findings   Reproductive/Obstetrics negative OB ROS                           Anesthesia Physical Anesthesia Plan  ASA: III  Anesthesia Plan: Spinal and MAC   Post-op Pain Management:    Induction: Intravenous  PONV Risk Score and Plan: 3 and Propofol infusion, Ondansetron and Dexamethasone  Airway Management Planned: Simple Face Mask  Additional Equipment:   Intra-op Plan:   Post-operative Plan:   Informed Consent: I have reviewed the patients History and Physical, chart, labs and discussed the procedure including the risks, benefits and alternatives for the proposed anesthesia with the patient or authorized representative who has indicated his/her understanding and acceptance.     Dental advisory given  Plan Discussed with: CRNA  Anesthesia Plan Comments: (PAT note written 08/22/2019 by Myra Gianotti, PA-C. Known CKD stage IV-V. Nephrologist is Dr. Joelyn Oms: Avoid nephrotoxic medications. )       Anesthesia Quick Evaluation

## 2019-08-23 ENCOUNTER — Other Ambulatory Visit (HOSPITAL_COMMUNITY)
Admission: RE | Admit: 2019-08-23 | Discharge: 2019-08-23 | Disposition: A | Discharge status: Home / Self Care | Payer: 59 | Source: Ambulatory Visit | Attending: Orthopaedic Surgery | Admitting: Orthopaedic Surgery

## 2019-08-23 DIAGNOSIS — Z01812 Encounter for preprocedural laboratory examination: Secondary | ICD-10-CM | POA: Diagnosis present

## 2019-08-23 DIAGNOSIS — Z20822 Contact with and (suspected) exposure to covid-19: Secondary | ICD-10-CM | POA: Diagnosis not present

## 2019-08-23 LAB — SARS CORONAVIRUS 2 (TAT 6-24 HRS): SARS Coronavirus 2: NEGATIVE

## 2019-08-23 MED ORDER — TRANEXAMIC ACID 1000 MG/10ML IV SOLN
2000.0000 mg | INTRAVENOUS | Status: AC
  Administered 2019-08-26: 2000 mg via TOPICAL
  Filled 2019-08-23: qty 20

## 2019-08-23 MED ORDER — BUPIVACAINE LIPOSOME 1.3 % IJ SUSP
10.0000 mL | Freq: Once | INTRAMUSCULAR | Status: DC
  Filled 2019-08-23: qty 10

## 2019-08-26 ENCOUNTER — Observation Stay (HOSPITAL_COMMUNITY)
Admission: RE | Admit: 2019-08-26 | Discharge: 2019-08-27 | Disposition: A | Discharge status: Home / Self Care | Payer: 59 | Attending: Orthopaedic Surgery | Admitting: Orthopaedic Surgery

## 2019-08-26 ENCOUNTER — Encounter (HOSPITAL_COMMUNITY)
Admission: RE | Disposition: A | Discharge status: Home / Self Care | Payer: Self-pay | Source: Home / Self Care | Attending: Orthopaedic Surgery

## 2019-08-26 ENCOUNTER — Ambulatory Visit (HOSPITAL_COMMUNITY): Payer: 59

## 2019-08-26 ENCOUNTER — Encounter (HOSPITAL_COMMUNITY): Payer: Self-pay | Admitting: Orthopaedic Surgery

## 2019-08-26 ENCOUNTER — Ambulatory Visit (HOSPITAL_COMMUNITY): Payer: 59 | Admitting: Anesthesiology

## 2019-08-26 ENCOUNTER — Ambulatory Visit (HOSPITAL_COMMUNITY): Payer: 59 | Admitting: Vascular Surgery

## 2019-08-26 ENCOUNTER — Other Ambulatory Visit: Payer: Self-pay

## 2019-08-26 DIAGNOSIS — M87052 Idiopathic aseptic necrosis of left femur: Secondary | ICD-10-CM | POA: Diagnosis present

## 2019-08-26 DIAGNOSIS — K219 Gastro-esophageal reflux disease without esophagitis: Secondary | ICD-10-CM | POA: Insufficient documentation

## 2019-08-26 DIAGNOSIS — J45909 Unspecified asthma, uncomplicated: Secondary | ICD-10-CM | POA: Diagnosis not present

## 2019-08-26 DIAGNOSIS — M8700 Idiopathic aseptic necrosis of unspecified bone: Secondary | ICD-10-CM | POA: Diagnosis present

## 2019-08-26 DIAGNOSIS — Z96642 Presence of left artificial hip joint: Secondary | ICD-10-CM

## 2019-08-26 DIAGNOSIS — Z79899 Other long term (current) drug therapy: Secondary | ICD-10-CM | POA: Diagnosis not present

## 2019-08-26 DIAGNOSIS — M109 Gout, unspecified: Secondary | ICD-10-CM | POA: Diagnosis not present

## 2019-08-26 DIAGNOSIS — N189 Chronic kidney disease, unspecified: Secondary | ICD-10-CM | POA: Insufficient documentation

## 2019-08-26 DIAGNOSIS — I129 Hypertensive chronic kidney disease with stage 1 through stage 4 chronic kidney disease, or unspecified chronic kidney disease: Secondary | ICD-10-CM | POA: Diagnosis not present

## 2019-08-26 DIAGNOSIS — Z96649 Presence of unspecified artificial hip joint: Secondary | ICD-10-CM

## 2019-08-26 DIAGNOSIS — M3210 Systemic lupus erythematosus, organ or system involvement unspecified: Secondary | ICD-10-CM | POA: Insufficient documentation

## 2019-08-26 DIAGNOSIS — Z419 Encounter for procedure for purposes other than remedying health state, unspecified: Secondary | ICD-10-CM

## 2019-08-26 HISTORY — PX: TOTAL HIP ARTHROPLASTY: SHX124

## 2019-08-26 LAB — TYPE AND SCREEN
ABO/RH(D): O POS
ABO/RH(D): O POS
Antibody Screen: NEGATIVE
Antibody Screen: NEGATIVE

## 2019-08-26 SURGERY — ARTHROPLASTY, HIP, TOTAL, ANTERIOR APPROACH
Anesthesia: Monitor Anesthesia Care | Site: Hip | Laterality: Left

## 2019-08-26 MED ORDER — CEFAZOLIN SODIUM-DEXTROSE 2-4 GM/100ML-% IV SOLN
2.0000 g | Freq: Once | INTRAVENOUS | Status: AC
  Administered 2019-08-26: 2 g via INTRAVENOUS
  Filled 2019-08-26: qty 100

## 2019-08-26 MED ORDER — MOMETASONE FURO-FORMOTEROL FUM 100-5 MCG/ACT IN AERO
2.0000 | INHALATION_SPRAY | Freq: Two times a day (BID) | RESPIRATORY_TRACT | Status: DC
  Administered 2019-08-27: 2 via RESPIRATORY_TRACT
  Filled 2019-08-26: qty 8.8

## 2019-08-26 MED ORDER — ALBUTEROL SULFATE (2.5 MG/3ML) 0.083% IN NEBU: 3.0000 mL | INHALATION_SOLUTION | Freq: Four times a day (QID) | RESPIRATORY_TRACT | Status: DC | PRN

## 2019-08-26 MED ORDER — FENTANYL CITRATE (PF) 100 MCG/2ML IJ SOLN
25.0000 ug | INTRAMUSCULAR | Status: DC | PRN
  Administered 2019-08-26: 25 ug via INTRAVENOUS

## 2019-08-26 MED ORDER — SPIRONOLACTONE 25 MG PO TABS
25.0000 mg | ORAL_TABLET | Freq: Every day | ORAL | Status: DC
  Administered 2019-08-27: 25 mg via ORAL
  Filled 2019-08-26: qty 1

## 2019-08-26 MED ORDER — CEFAZOLIN SODIUM-DEXTROSE 2-4 GM/100ML-% IV SOLN
2.0000 g | INTRAVENOUS | Status: AC
  Administered 2019-08-26: 2 g via INTRAVENOUS
  Filled 2019-08-26: qty 100

## 2019-08-26 MED ORDER — VANCOMYCIN HCL 1000 MG IV SOLR
INTRAVENOUS | Status: AC
  Filled 2019-08-26: qty 1000

## 2019-08-26 MED ORDER — ONDANSETRON HCL 4 MG PO TABS: 4.0000 mg | ORAL_TABLET | Freq: Four times a day (QID) | ORAL | Status: DC | PRN

## 2019-08-26 MED ORDER — FENTANYL CITRATE (PF) 250 MCG/5ML IJ SOLN
INTRAMUSCULAR | Status: AC
  Filled 2019-08-26: qty 5

## 2019-08-26 MED ORDER — ONDANSETRON HCL 4 MG/2ML IJ SOLN
INTRAMUSCULAR | Status: DC | PRN
  Administered 2019-08-26: 4 mg via INTRAVENOUS

## 2019-08-26 MED ORDER — BUPIVACAINE HCL (PF) 0.25 % IJ SOLN
INTRAMUSCULAR | Status: AC
  Filled 2019-08-26: qty 30

## 2019-08-26 MED ORDER — METOCLOPRAMIDE HCL 5 MG/ML IJ SOLN: 5.0000 mg | Freq: Three times a day (TID) | INTRAMUSCULAR | Status: DC | PRN

## 2019-08-26 MED ORDER — SORBITOL 70 % SOLN
30.0000 mL | Freq: Every day | Status: DC | PRN
  Filled 2019-08-26: qty 30

## 2019-08-26 MED ORDER — LACTATED RINGERS IV SOLN: INTRAVENOUS | Status: DC | PRN

## 2019-08-26 MED ORDER — ASPIRIN EC 81 MG PO TBEC
81.0000 mg | DELAYED_RELEASE_TABLET | Freq: Two times a day (BID) | ORAL | 0 refills | Status: DC
Start: 2019-08-26 — End: 2022-04-28

## 2019-08-26 MED ORDER — MIDAZOLAM HCL 2 MG/2ML IJ SOLN
INTRAMUSCULAR | Status: AC
  Filled 2019-08-26: qty 2

## 2019-08-26 MED ORDER — BUPIVACAINE IN DEXTROSE 0.75-8.25 % IT SOLN
INTRATHECAL | Status: DC | PRN
  Administered 2019-08-26: 1.6 mL via INTRATHECAL

## 2019-08-26 MED ORDER — POVIDONE-IODINE 10 % EX SWAB: 2.0000 "application " | Freq: Once | CUTANEOUS | Status: DC

## 2019-08-26 MED ORDER — PHENYLEPHRINE HCL-NACL 10-0.9 MG/250ML-% IV SOLN
INTRAVENOUS | Status: DC | PRN
  Administered 2019-08-26: 30 ug/min via INTRAVENOUS

## 2019-08-26 MED ORDER — HYDROMORPHONE HCL 1 MG/ML IJ SOLN
0.5000 mg | INTRAMUSCULAR | Status: DC | PRN
  Administered 2019-08-26: 0.5 mg via INTRAVENOUS
  Administered 2019-08-26: 1 mg via INTRAVENOUS
  Filled 2019-08-26 (×2): qty 1

## 2019-08-26 MED ORDER — PROPOFOL 10 MG/ML IV BOLUS
INTRAVENOUS | Status: AC
  Filled 2019-08-26: qty 20

## 2019-08-26 MED ORDER — ASPIRIN 81 MG PO CHEW
81.0000 mg | CHEWABLE_TABLET | Freq: Two times a day (BID) | ORAL | Status: DC
  Administered 2019-08-26 – 2019-08-27 (×2): 81 mg via ORAL
  Filled 2019-08-26 (×2): qty 1

## 2019-08-26 MED ORDER — GABAPENTIN 300 MG PO CAPS: 300.0000 mg | ORAL_CAPSULE | Freq: Three times a day (TID) | ORAL | Status: DC

## 2019-08-26 MED ORDER — CHLORHEXIDINE GLUCONATE 0.12 % MT SOLN
OROMUCOSAL | Status: AC
  Administered 2019-08-26: 15 mL
  Filled 2019-08-26: qty 15

## 2019-08-26 MED ORDER — CEPHALEXIN 500 MG PO CAPS: 500.0000 mg | ORAL_CAPSULE | Freq: Four times a day (QID) | ORAL | 0 refills | Status: DC

## 2019-08-26 MED ORDER — FEBUXOSTAT 40 MG PO TABS
80.0000 mg | ORAL_TABLET | Freq: Every day | ORAL | Status: DC
  Administered 2019-08-27: 80 mg via ORAL
  Filled 2019-08-26: qty 2

## 2019-08-26 MED ORDER — OXYCODONE HCL 5 MG PO TABS: 10.0000 mg | ORAL_TABLET | ORAL | Status: DC | PRN

## 2019-08-26 MED ORDER — GABAPENTIN 300 MG PO CAPS
300.0000 mg | ORAL_CAPSULE | Freq: Two times a day (BID) | ORAL | Status: DC
  Administered 2019-08-26 – 2019-08-27 (×2): 300 mg via ORAL
  Filled 2019-08-26 (×2): qty 1

## 2019-08-26 MED ORDER — ONDANSETRON HCL 4 MG PO TABS: 4.0000 mg | ORAL_TABLET | Freq: Three times a day (TID) | ORAL | 0 refills | Status: DC | PRN

## 2019-08-26 MED ORDER — HYDROXYCHLOROQUINE SULFATE 200 MG PO TABS
200.0000 mg | ORAL_TABLET | Freq: Every day | ORAL | Status: DC
  Administered 2019-08-27: 200 mg via ORAL
  Filled 2019-08-26: qty 1

## 2019-08-26 MED ORDER — ACETAMINOPHEN 500 MG PO TABS
1000.0000 mg | ORAL_TABLET | Freq: Once | ORAL | Status: AC
  Administered 2019-08-26: 1000 mg via ORAL
  Filled 2019-08-26: qty 2

## 2019-08-26 MED ORDER — METOPROLOL SUCCINATE ER 50 MG PO TB24
50.0000 mg | ORAL_TABLET | Freq: Every day | ORAL | Status: DC
  Administered 2019-08-27: 50 mg via ORAL
  Filled 2019-08-26: qty 1

## 2019-08-26 MED ORDER — SODIUM CHLORIDE 0.9 % IV SOLN
INTRAVENOUS | Status: DC | PRN
  Administered 2019-08-26: 40 mL

## 2019-08-26 MED ORDER — DOCUSATE SODIUM 100 MG PO CAPS
100.0000 mg | ORAL_CAPSULE | Freq: Two times a day (BID) | ORAL | Status: DC
  Administered 2019-08-26 – 2019-08-27 (×2): 100 mg via ORAL
  Filled 2019-08-26 (×2): qty 1

## 2019-08-26 MED ORDER — MYCOPHENOLATE MOFETIL 250 MG PO CAPS
1000.0000 mg | ORAL_CAPSULE | Freq: Two times a day (BID) | ORAL | Status: DC
  Administered 2019-08-27: 1000 mg via ORAL
  Filled 2019-08-26 (×3): qty 4

## 2019-08-26 MED ORDER — MENTHOL 3 MG MT LOZG: 1.0000 | LOZENGE | OROMUCOSAL | Status: DC | PRN

## 2019-08-26 MED ORDER — SODIUM CHLORIDE 0.9% FLUSH: INTRAVENOUS | Status: DC | PRN

## 2019-08-26 MED ORDER — FENTANYL CITRATE (PF) 100 MCG/2ML IJ SOLN
INTRAMUSCULAR | Status: AC
  Filled 2019-08-26: qty 2

## 2019-08-26 MED ORDER — POLYETHYLENE GLYCOL 3350 17 G PO PACK: 17.0000 g | PACK | Freq: Every day | ORAL | Status: DC | PRN

## 2019-08-26 MED ORDER — VANCOMYCIN HCL 1 G IV SOLR
INTRAVENOUS | Status: DC | PRN
  Administered 2019-08-26: 1000 mg via TOPICAL

## 2019-08-26 MED ORDER — PROPOFOL 500 MG/50ML IV EMUL
INTRAVENOUS | Status: DC | PRN
  Administered 2019-08-26: 100 ug/kg/min via INTRAVENOUS

## 2019-08-26 MED ORDER — FUROSEMIDE 40 MG PO TABS: 40.0000 mg | ORAL_TABLET | Freq: Every day | ORAL | Status: DC | PRN

## 2019-08-26 MED ORDER — ONDANSETRON HCL 4 MG/2ML IJ SOLN: 4.0000 mg | Freq: Four times a day (QID) | INTRAMUSCULAR | Status: DC | PRN

## 2019-08-26 MED ORDER — COLCHICINE 0.6 MG PO TABS
0.6000 mg | ORAL_TABLET | Freq: Every day | ORAL | Status: DC
  Administered 2019-08-27: 0.6 mg via ORAL
  Filled 2019-08-26: qty 1

## 2019-08-26 MED ORDER — SODIUM CHLORIDE 0.9 % IV SOLN: INTRAVENOUS | Status: DC

## 2019-08-26 MED ORDER — METOCLOPRAMIDE HCL 5 MG PO TABS: 5.0000 mg | ORAL_TABLET | Freq: Three times a day (TID) | ORAL | Status: DC | PRN

## 2019-08-26 MED ORDER — AMPHETAMINE-DEXTROAMPHETAMINE 10 MG PO TABS: 5.0000 mg | ORAL_TABLET | ORAL | Status: DC

## 2019-08-26 MED ORDER — MAGNESIUM CITRATE PO SOLN: 1.0000 | Freq: Once | ORAL | Status: DC | PRN

## 2019-08-26 MED ORDER — ONDANSETRON HCL 4 MG/2ML IJ SOLN
INTRAMUSCULAR | Status: AC
  Filled 2019-08-26: qty 8

## 2019-08-26 MED ORDER — VITAMIN D 25 MCG (1000 UNIT) PO TABS
2000.0000 [IU] | ORAL_TABLET | Freq: Every day | ORAL | Status: DC
  Administered 2019-08-27: 2000 [IU] via ORAL
  Filled 2019-08-26 (×2): qty 2

## 2019-08-26 MED ORDER — 0.9 % SODIUM CHLORIDE (POUR BTL) OPTIME
TOPICAL | Status: DC | PRN
  Administered 2019-08-26: 1000 mL

## 2019-08-26 MED ORDER — OXYCODONE HCL 5 MG PO TABS
5.0000 mg | ORAL_TABLET | ORAL | Status: DC | PRN
  Administered 2019-08-26 – 2019-08-27 (×3): 10 mg via ORAL
  Filled 2019-08-26 (×4): qty 2

## 2019-08-26 MED ORDER — TRANEXAMIC ACID 1000 MG/10ML IV SOLN: 2000.0000 mg | INTRAVENOUS | Status: DC

## 2019-08-26 MED ORDER — METHOCARBAMOL 750 MG PO TABS
750.0000 mg | ORAL_TABLET | Freq: Two times a day (BID) | ORAL | 3 refills | Status: DC | PRN
Start: 2019-08-26 — End: 2022-04-28

## 2019-08-26 MED ORDER — ALUM & MAG HYDROXIDE-SIMETH 200-200-20 MG/5ML PO SUSP: 30.0000 mL | ORAL | Status: DC | PRN

## 2019-08-26 MED ORDER — LACTATED RINGERS IV SOLN: INTRAVENOUS | Status: DC

## 2019-08-26 MED ORDER — OXYCODONE HCL ER 10 MG PO T12A
10.0000 mg | EXTENDED_RELEASE_TABLET | Freq: Two times a day (BID) | ORAL | Status: DC
  Administered 2019-08-26 – 2019-08-27 (×2): 10 mg via ORAL
  Filled 2019-08-26 (×2): qty 1

## 2019-08-26 MED ORDER — OXYCODONE-ACETAMINOPHEN 5-325 MG PO TABS: 1.0000 | ORAL_TABLET | Freq: Three times a day (TID) | ORAL | 0 refills | Status: DC | PRN

## 2019-08-26 MED ORDER — BUPIVACAINE HCL 0.25 % IJ SOLN
INTRAMUSCULAR | Status: DC | PRN
  Administered 2019-08-26: 20 mL

## 2019-08-26 MED ORDER — MIDAZOLAM HCL 5 MG/5ML IJ SOLN
INTRAMUSCULAR | Status: DC | PRN
  Administered 2019-08-26: 2 mg via INTRAVENOUS

## 2019-08-26 MED ORDER — SODIUM CHLORIDE 0.9 % IR SOLN
Status: DC | PRN
  Administered 2019-08-26: 3000 mL

## 2019-08-26 MED ORDER — IRRISEPT - 450ML BOTTLE WITH 0.05% CHG IN STERILE WATER, USP 99.95% OPTIME
TOPICAL | Status: DC | PRN
  Administered 2019-08-26: 450 mL via TOPICAL

## 2019-08-26 MED ORDER — PROPOFOL 10 MG/ML IV BOLUS
INTRAVENOUS | Status: DC | PRN
  Administered 2019-08-26: 20 mg via INTRAVENOUS

## 2019-08-26 MED ORDER — DIPHENHYDRAMINE HCL 12.5 MG/5ML PO ELIX
25.0000 mg | ORAL_SOLUTION | ORAL | Status: DC | PRN
  Filled 2019-08-26: qty 10

## 2019-08-26 MED ORDER — ACETAMINOPHEN 500 MG PO TABS
1000.0000 mg | ORAL_TABLET | Freq: Four times a day (QID) | ORAL | Status: AC
  Administered 2019-08-26 (×3): 1000 mg via ORAL
  Filled 2019-08-26 (×4): qty 2

## 2019-08-26 MED ORDER — TRANEXAMIC ACID-NACL 1000-0.7 MG/100ML-% IV SOLN
1000.0000 mg | INTRAVENOUS | Status: AC
  Administered 2019-08-26: 1000 mg via INTRAVENOUS
  Filled 2019-08-26: qty 100

## 2019-08-26 MED ORDER — PHENOL 1.4 % MT LIQD: 1.0000 | OROMUCOSAL | Status: DC | PRN

## 2019-08-26 MED ORDER — PHENYLEPHRINE 40 MCG/ML (10ML) SYRINGE FOR IV PUSH (FOR BLOOD PRESSURE SUPPORT)
PREFILLED_SYRINGE | INTRAVENOUS | Status: DC | PRN
  Administered 2019-08-26: 80 ug via INTRAVENOUS

## 2019-08-26 MED ORDER — HYDROXYZINE HCL 50 MG/ML IM SOLN
50.0000 mg | Freq: Four times a day (QID) | INTRAMUSCULAR | Status: DC | PRN
  Administered 2019-08-26: 50 mg via INTRAMUSCULAR
  Filled 2019-08-26: qty 1

## 2019-08-26 MED ORDER — DEXAMETHASONE SODIUM PHOSPHATE 10 MG/ML IJ SOLN
10.0000 mg | Freq: Once | INTRAMUSCULAR | Status: AC
  Administered 2019-08-27: 10 mg via INTRAVENOUS
  Filled 2019-08-26: qty 1

## 2019-08-26 SURGICAL SUPPLY — 65 items
ADH SKN CLS APL DERMABOND .7 (GAUZE/BANDAGES/DRESSINGS) ×1
BAG DECANTER FOR FLEXI CONT (MISCELLANEOUS) ×3 IMPLANT
CELLS DAT CNTRL 66122 CELL SVR (MISCELLANEOUS) ×1 IMPLANT
COVER PERINEAL POST (MISCELLANEOUS) ×3 IMPLANT
COVER SURGICAL LIGHT HANDLE (MISCELLANEOUS) ×3 IMPLANT
COVER WAND RF STERILE (DRAPES) ×3 IMPLANT
CUP SECTOR GRIPTON 50MM (Cup) ×3 IMPLANT
DERMABOND ADVANCED (GAUZE/BANDAGES/DRESSINGS) ×2
DERMABOND ADVANCED .7 DNX12 (GAUZE/BANDAGES/DRESSINGS) IMPLANT
DRAPE C-ARM 42X72 X-RAY (DRAPES) ×3 IMPLANT
DRAPE POUCH INSTRU U-SHP 10X18 (DRAPES) ×3 IMPLANT
DRAPE STERI IOBAN 125X83 (DRAPES) ×3 IMPLANT
DRAPE U-SHAPE 47X51 STRL (DRAPES) ×6 IMPLANT
DRSG AQUACEL AG ADV 3.5X10 (GAUZE/BANDAGES/DRESSINGS) ×3 IMPLANT
DURAPREP 26ML APPLICATOR (WOUND CARE) ×6 IMPLANT
ELECT BLADE 4.0 EZ CLEAN MEGAD (MISCELLANEOUS) ×3
ELECT REM PT RETURN 9FT ADLT (ELECTROSURGICAL) ×3
ELECTRODE BLDE 4.0 EZ CLN MEGD (MISCELLANEOUS) ×1 IMPLANT
ELECTRODE REM PT RTRN 9FT ADLT (ELECTROSURGICAL) ×1 IMPLANT
GLOVE BIOGEL PI IND STRL 7.0 (GLOVE) ×1 IMPLANT
GLOVE BIOGEL PI INDICATOR 7.0 (GLOVE) ×2
GLOVE ECLIPSE 7.0 STRL STRAW (GLOVE) ×6 IMPLANT
GLOVE SKINSENSE NS SZ7.5 (GLOVE) ×2
GLOVE SKINSENSE STRL SZ7.5 (GLOVE) ×1 IMPLANT
GLOVE SURG SYN 7.5  E (GLOVE) ×12
GLOVE SURG SYN 7.5 E (GLOVE) ×4 IMPLANT
GOWN STRL REIN XL XLG (GOWN DISPOSABLE) ×3 IMPLANT
GOWN STRL REUS W/ TWL LRG LVL3 (GOWN DISPOSABLE) IMPLANT
GOWN STRL REUS W/ TWL XL LVL3 (GOWN DISPOSABLE) ×1 IMPLANT
GOWN STRL REUS W/TWL LRG LVL3 (GOWN DISPOSABLE)
GOWN STRL REUS W/TWL XL LVL3 (GOWN DISPOSABLE) ×3
HANDPIECE INTERPULSE COAX TIP (DISPOSABLE) ×3
HEAD CERAMIC DELTA 36 PLUS 1.5 (Hips) ×2 IMPLANT
HEAD FEMORAL 32 CERAMIC (Hips) ×2 IMPLANT
HOOD PEEL AWAY FLYTE STAYCOOL (MISCELLANEOUS) ×6 IMPLANT
IV NS IRRIG 3000ML ARTHROMATIC (IV SOLUTION) ×3 IMPLANT
JET LAVAGE IRRISEPT WOUND (IRRIGATION / IRRIGATOR) ×3
KIT BASIN OR (CUSTOM PROCEDURE TRAY) ×3 IMPLANT
LAVAGE JET IRRISEPT WOUND (IRRIGATION / IRRIGATOR) ×1 IMPLANT
LINER ACET PNNCL PLUS4 NEUTRAL (Hips) IMPLANT
MARKER SKIN DUAL TIP RULER LAB (MISCELLANEOUS) ×3 IMPLANT
NEEDLE SPNL 18GX3.5 QUINCKE PK (NEEDLE) ×3 IMPLANT
PACK TOTAL JOINT (CUSTOM PROCEDURE TRAY) ×3 IMPLANT
PACK UNIVERSAL I (CUSTOM PROCEDURE TRAY) ×3 IMPLANT
PINNACLE PLUS 4 NEUTRAL (Hips) ×3 IMPLANT
RTRCTR WOUND ALEXIS 18CM MED (MISCELLANEOUS) ×3
SAW OSC TIP CART 19.5X105X1.3 (SAW) ×3 IMPLANT
SCREW 6.5MMX25MM (Screw) ×2 IMPLANT
SET HNDPC FAN SPRY TIP SCT (DISPOSABLE) ×1 IMPLANT
STAPLER VISISTAT 35W (STAPLE) IMPLANT
STEM FEMORAL SZ 6MM STD ACTIS (Stem) ×2 IMPLANT
SUT ETHIBOND 2 V 37 (SUTURE) ×3 IMPLANT
SUT ETHILON 2 0 PSLX (SUTURE) ×4 IMPLANT
SUT VIC AB 0 CT1 27 (SUTURE) ×3
SUT VIC AB 0 CT1 27XBRD ANBCTR (SUTURE) ×1 IMPLANT
SUT VIC AB 1 CTX 36 (SUTURE) ×3
SUT VIC AB 1 CTX36XBRD ANBCTR (SUTURE) ×1 IMPLANT
SUT VIC AB 2-0 CT1 27 (SUTURE) ×6
SUT VIC AB 2-0 CT1 TAPERPNT 27 (SUTURE) ×2 IMPLANT
SYR 50ML LL SCALE MARK (SYRINGE) ×3 IMPLANT
TOWEL GREEN STERILE (TOWEL DISPOSABLE) ×3 IMPLANT
TRAY CATH 16FR W/PLASTIC CATH (SET/KITS/TRAYS/PACK) IMPLANT
TRAY FOLEY W/BAG SLVR 16FR (SET/KITS/TRAYS/PACK) ×3
TRAY FOLEY W/BAG SLVR 16FR ST (SET/KITS/TRAYS/PACK) ×1 IMPLANT
YANKAUER SUCT BULB TIP NO VENT (SUCTIONS) ×3 IMPLANT

## 2019-08-26 NOTE — H&P (Signed)
PREOPERATIVE H&P  Chief Complaint: Avascular Necrosis Left Hip  HPI: Colleen Obrien is a 64 y.o. female who presents for surgical treatment of Avascular Necrosis Left Hip.  She denies any changes in medical history.  Past Medical History:  Diagnosis Date  . Anemia   . Arthritis   . Asthma   . Blood dyscrasia    systemic lupus  . Chronic kidney disease   . Dyspnea   . GERD (gastroesophageal reflux disease)   . Gout   . Hypertension   . Lupus ([place])   . Narcolepsy   . Pneumonia   . Sleep apnea    Past Surgical History:  Procedure Laterality Date  . CESAREAN SECTION    . TUBAL LIGATION     Social History   Socioeconomic History  . Marital status: Married    Spouse name: Not on file  . Number of children: 2  . Years of education: College  . Highest education level: Not on file  Occupational History  . Occupation: Cytotechnologists  Tobacco Use  . Smoking status: Never Smoker  . Smokeless tobacco: Never Used  Vaping Use  . Vaping Use: Never used  Substance and Sexual Activity  . Alcohol use: No  . Drug use: No  . Sexual activity: Not on file  Other Topics Concern  . Not on file  Social History Narrative   Lives at home with her husband.   Right-handed.   2-3 cups caffeine daily.   Social Determinants of Health   Financial Resource Strain:   . Difficulty of Paying Living Expenses:   Food Insecurity:   . Worried About Charity fundraiser in the Last Year:   . Arboriculturist in the Last Year:   Transportation Needs:   . Film/video editor (Medical):   Marland Kitchen Lack of Transportation (Non-Medical):   Physical Activity:   . Days of Exercise per Week:   . Minutes of Exercise per Session:   Stress:   . Feeling of Stress :   Social Connections:   . Frequency of Communication with Friends and Family:   . Frequency of Social Gatherings with Friends and Family:   . Attends Religious Services:   . Active Member of Clubs or Organizations:   .  Attends Archivist Meetings:   Marland Kitchen Marital Status:    Family History  Problem Relation Age of Onset  . Hypertension Mother   . Diabetes Mother   . Dementia Father   . Hypertension Father   . Hyperlipidemia Father   . Diabetes Paternal Grandmother   . Alcohol abuse Paternal Grandfather    Allergies  Allergen Reactions  . Erythromycin Nausea And Vomiting  . Levofloxacin     Causes muscle pain   . Sulfa Antibiotics Hives   Prior to Admission medications   Medication Sig Start Date End Date Taking? Authorizing Provider  acetaminophen (TYLENOL) 500 MG tablet Take 500 mg by mouth every 6 (six) hours as needed (for pain.).   Yes [provider]  albuterol (PROVENTIL HFA;VENTOLIN HFA) 108 (90 BASE) MCG/ACT inhaler Inhale into the lungs every 6 (six) hours as needed for wheezing or shortness of breath.   Yes [provider]  amphetamine-dextroamphetamine (ADDERALL) 5 MG tablet Take 5-10 mg by mouth See admin instructions. Take 2 tablets (10 mg) by mouth in the morning & take 1 tablet (5 mg) by mouth in the afternoon. 02/21/17  Yes [provider]  Cholecalciferol (VITAMIN D3)  50 MCG (2000 UT) TABS Take 2,000 Units by mouth daily.   Yes [provider]  colchicine 0.6 MG tablet Take 0.6 mg by mouth daily.   Yes [provider]  denosumab (PROLIA) 60 MG/ML SOLN injection Inject 60 mg into the skin every 6 (six) months. Administer in upper arm, thigh, or abdomen   Yes [provider]  Febuxostat 80 MG TABS Take 80 mg by mouth daily. 07/18/19  Yes [provider]  furosemide (LASIX) 80 MG tablet Take 40-80 mg by mouth daily as needed (fluid retention.).    Yes [provider]  hydroxychloroquine (PLAQUENIL) 200 MG tablet Take 200 mg by mouth daily.    Yes [provider]  metoprolol succinate (TOPROL-XL) 50 MG 24 hr tablet Take 50 mg by mouth daily. 07/23/19  Yes [provider]  mycophenolate (CELLCEPT)  500 MG tablet Take 1,000 mg by mouth 2 (two) times daily.    Yes [provider]  spironolactone (ALDACTONE) 25 MG tablet Take 25 mg by mouth daily.   Yes [provider]  budesonide-formoterol (SYMBICORT) 80-4.5 MCG/ACT inhaler Inhale 2 puffs into the lungs in the morning and at bedtime.    [provider]     Positive ROS: All other systems have been reviewed and were otherwise negative with the exception of those mentioned in the HPI and as above.  Physical Exam: General: Alert, no acute distress Cardiovascular: No pedal edema Respiratory: No cyanosis, no use of accessory musculature GI: abdomen soft Skin: No lesions in the area of chief complaint Neurologic: Sensation intact distally Psychiatric: Patient is competent for consent with normal mood and affect Lymphatic: no lymphedema  MUSCULOSKELETAL: exam stable  Assessment: Avascular Necrosis Left Hip  Plan: Plan for Procedure(s): LEFT TOTAL HIP ARTHROPLASTY ANTERIOR APPROACH  The risks benefits and alternatives were discussed with the patient including but not limited to the risks of nonoperative treatment, versus surgical intervention including infection, bleeding, nerve injury,  blood clots, cardiopulmonary complications, morbidity, mortality, among others, and they were willing to proceed.   Preoperative templating of the joint replacement has been completed, documented, and submitted to the Operating Room personnel in order to optimize intra-operative equipment management.   Eduard Roux, MD 08/26/2019 6:16 AM

## 2019-08-26 NOTE — Op Note (Signed)
LEFT TOTAL HIP ARTHROPLASTY ANTERIOR APPROACH  Procedure Note Colleen Obrien   [id number]  Pre-op Diagnosis: Avascular Necrosis Left Hip     Post-op Diagnosis: same   Operative Procedures  1. Total hip replacement; Left hip; uncemented [id number]   Personnel  Surgeon(s): Leandrew Koyanagi, MD  Assist: Katy Apo, RNFA   Anesthesia: spinal, local  Prosthesis: Depuy Acetabulum: Pinnacle 50 mm Femur: Actis 6 STD Head: 32 mm size: +1 Liner: lateralized Bearing Type: ceramic on poly  Total Hip Arthroplasty (Anterior Approach) Op Note:  After informed consent was obtained and the operative extremity marked in the holding area, the patient was brought back to the operating room and placed supine on the HANA table. Next, the operative extremity was prepped and draped in normal sterile fashion. Surgical timeout occurred verifying patient identification, surgical site, surgical procedure and administration of antibiotics.  A modified anterior Smith-Peterson approach to the hip was performed, using the interval between tensor fascia lata and sartorius.  Dissection was carried bluntly down onto the anterior hip capsule. The lateral femoral circumflex vessels were identified and coagulated. A capsulotomy was performed and the capsular flaps tagged for later repair.  The neck osteotomy was performed. The femoral head was removed, the acetabular rim was cleared of soft tissue and attention was turned to reaming the acetabulum.  Sequential reaming was performed under fluoroscopic guidance. We reamed to a size 50 mm, and then impacted the acetabular shell. A 25 mm cancellous screw was placed through the shell for added fixation.  The liner was then placed after irrigation and attention turned to the femur.  After placing the femoral hook, the leg was taken to externally rotated, extended and adducted position taking care to perform soft tissue releases to allow for adequate mobilization of  the femur. Soft tissue was cleared from the shoulder of the greater trochanter and the hook elevator used to improve exposure of the proximal femur. Sequential broaching performed up to a size 6. Trial neck and head were placed. The leg was brought back up to neutral and the construct reduced.  Antibiotic irrigation was placed in the surgical wound and kept for at least 1 minute.  The position and sizing of components, offset and leg lengths were checked using fluoroscopy. Stability of the construct was checked in extension and external rotation without any subluxation or impingement of prosthesis. We dislocated the prosthesis, dropped the leg back into position, removed trial components, and irrigated copiously. The final stem and head was then placed, the leg brought back up, the system reduced and fluoroscopy used to verify positioning.  We irrigated, obtained hemostasis and closed the capsule using #2 ethibond suture.  One gram of vancomycin powder was placed in the surgical bed. A dilute solution of 20 cc of normal saline, 1.3% exparel, 0.25% bupivacaine was injected in the soft tissues.  One gram of topical tranexamic acid was injected into the joint.  The fascia was closed with #1 vicryl plus, the deep fat layer was closed with 0 vicryl, the subcutaneous layers closed with 2.0 Vicryl Plus and the skin closed with 2.0 nylon and dermabond. A sterile dressing was applied. The patient was awakened in the operating room and taken to recovery in stable condition.  All sponge, needle, and instrument counts were correct at the end of the case.   Position: supine  Complications: see description of procedure.  Time Out: performed   Drains/Packing: none  Estimated blood loss: see anesthesia record  Returned to  Recovery Room: in good condition.   Antibiotics: yes   Mechanical VTE (DVT) Prophylaxis: sequential compression devices, TED thigh-high  Chemical VTE (DVT) Prophylaxis: aspirin   Fluid  Replacement: see anesthesia record  Specimens Removed: 1 to pathology   Sponge and Instrument Count Correct? yes   PACU: portable radiograph - low AP   Plan/RTC: Return in 2 weeks for staple removal. Weight Bearing/Load Lower Extremity: full  Hip precautions: none Suture Removal: 2 weeks   N. Eduard Roux, MD [facility] 9:01 AM   Implant Name Type Inv. Item Serial No. Manufacturer Lot No. LRB No. Used Action  PINNACLE PLUS 4 NEUTRAL - [id number] Hips PINNACLE PLUS 4 NEUTRAL  DEPUY SYNTHES [id number] Left 1 Implanted  SCREW 6.5MMX25MM - [id number] Screw SCREW 6.5MMX25MM  DEPUY SYNTHES [id number] Left 1 Implanted  CUP SECTOR GRIPTON 50MM - [id number] Cup CUP SECTOR GRIPTON 50MM  DEPUY SYNTHES [id number] Left 1 Implanted  STEM FEMORAL SZ 6MM STD ACTIS - [id number] Stem STEM FEMORAL SZ 6MM STD ACTIS  DEPUY ORTHOPAEDICS [id number] Left 1 Implanted  HEAD CERAMIC DELTA 36 PLUS 1.5 - [id number] Hips HEAD CERAMIC DELTA 36 PLUS 1.5  DEPUY SYNTHES [id number] Left 1 Implanted and Explanted  HEAD FEMORAL 32 CERAMIC - [id number] Hips HEAD FEMORAL 32 CERAMIC  DEPUY SYNTHES [id number] Left 1 Implanted

## 2019-08-26 NOTE — Anesthesia Postprocedure Evaluation (Signed)
Anesthesia Post Note  Patient: Colleen Obrien  Procedure(s) Performed: LEFT TOTAL HIP ARTHROPLASTY ANTERIOR APPROACH (Left Hip)     Patient location during evaluation: PACU Anesthesia Type: MAC and Spinal Level of consciousness: oriented and awake and alert Pain management: pain level controlled Vital Signs Assessment: post-procedure vital signs reviewed and stable Respiratory status: spontaneous breathing and respiratory function stable Cardiovascular status: blood pressure returned to baseline and stable Postop Assessment: no headache, no backache, no apparent nausea or vomiting, spinal receding and patient able to bend at knees Anesthetic complications: no   No complications documented.  Last Vitals:  Vitals:   08/26/19 1015 08/26/19 1043  BP: 108/82 (!) 118/88  Pulse: 58 55  Resp: 13 18  Temp:    SpO2: 99% 100%    Last Pain:  Vitals:   08/26/19 1000  TempSrc:   PainSc: 7                  [name],W. EDMOND

## 2019-08-26 NOTE — Evaluation (Signed)
Physical Therapy Evaluation Patient Details Name: Colleen Obrien MRN: [id number] DOB: 1955/06/24 Today's Date: 08/26/2019   History of Present Illness   Pt is a 64 y.o. female who presents for surgical treatment of Avascular Necrosis Left Hip - s/p L THA direct anterior approach. Pt has PMH of anemia, asthma, lupus, Gout, HTN, and Pneumonia      Clinical Impression  Pt was evaluated today for the above diagnosis and the impairments listed below. Pt required min guard to min assist for all bed mobility, transfers and gait with RW. Pt was limited today due to 10/10 pain. Pt was educated on HEP for L THA. Pt would continue to benefit from acute therapy services to return her to PLOF. Will continue to follow as needed.     Follow Up Recommendations Follow surgeon's recommendation for DC plan and follow-up therapies    Equipment Recommendations  Rolling walker with 5" wheels;3in1 (PT)    Recommendations for Other Services       Precautions / Restrictions Precautions Precautions: None Restrictions Weight Bearing Restrictions: Yes LLE Weight Bearing: Weight bearing as tolerated      Mobility  Bed Mobility Overal bed mobility: Needs Assistance Bed Mobility: Supine to Sit     Supine to sit: Min assist     General bed mobility comments: pt required min assist for the LLE only with supine to sit  Transfers Overall transfer level: Needs assistance Equipment used: Rolling walker (2 wheeled) Transfers: Sit to/from Stand Sit to Stand: Min assist         General transfer comment: pt required min assist for lift assist and RW with sit<>stand transfer. Pt also required verbal cueing for proper hand placement with RW.   Ambulation/Gait Ambulation/Gait assistance: Min guard Gait Distance (Feet): 25 Feet Assistive device: Rolling walker (2 wheeled) Gait Pattern/deviations: Decreased step length - right;Decreased step length - left;Decreased stance time - left;Decreased  stride length;Decreased dorsiflexion - right;Decreased dorsiflexion - left;Shuffle;Step-to pattern Gait velocity: decreased   General Gait Details: pt demonstrated very slow and guarded gait mainly due to pain in LLE, pt grimaced and breathed heavily during ambulation. Required cues for sequencing using RW.   Stairs            Wheelchair Mobility    Modified Rankin (Stroke Patients Only)       Balance Overall balance assessment: Needs assistance Sitting-balance support: Feet supported;No upper extremity supported Sitting balance-Leahy Scale: Good Sitting balance - Comments: pt able to sit at EOB without UE support while gait belt and gown was applied.    Standing balance support: Bilateral upper extremity supported;During functional activity Standing balance-Leahy Scale: Poor Standing balance comment: pt required min guard and RW with standing balance                             Pertinent Vitals/Pain Pain Assessment: 0-10 Pain Score: 10-Worst pain ever Pain Location: L hip Pain Descriptors / Indicators: Grimacing;Guarding;Operative site guarding Pain Intervention(s): Limited activity within patient's tolerance;Monitored during session;Repositioned    Home Living Family/patient expects to be discharged to:: Private residence Living Arrangements: Spouse/significant other Available Help at Discharge: Family Type of Home: House Home Access: Stairs to enter Entrance Stairs-Rails: None Entrance Stairs-Number of Steps: 3 Home Layout: Able to live on main level with bedroom/bathroom;Two level Home Equipment: Crutches Additional Comments: pt started using a crutch when L hip started to get painful    Prior Function Level of Independence: Independent;Independent  with assistive device(s)         Comments: Reports occasionally using crutch when in increased pain      Hand Dominance        Extremity/Trunk Assessment   Upper Extremity Assessment Upper  Extremity Assessment: Overall WFL for tasks assessed    Lower Extremity Assessment Lower Extremity Assessment: LLE deficits/detail LLE Deficits / Details: Deficits consistent with L THA.  LLE: Unable to fully assess due to pain    Cervical / Trunk Assessment Cervical / Trunk Assessment: Normal  Communication   Communication: No difficulties  Cognition Arousal/Alertness: Awake/alert Behavior During Therapy: WFL for tasks assessed/performed Overall Cognitive Status: Within Functional Limits for tasks assessed                                        General Comments General comments (skin integrity, edema, etc.): Pt's husband present during session     Exercises General Exercises - Lower Extremity Ankle Circles/Pumps: AROM;Both;10 reps Quad Sets: AROM;Both;10 reps Heel Slides: AROM;Right;10 reps   Assessment/Plan    PT Assessment Patient needs continued PT services  PT Problem List Decreased strength;Decreased range of motion;Decreased activity tolerance;Decreased balance;Decreased mobility;Decreased coordination;Decreased knowledge of use of DME;Pain       PT Treatment Interventions DME instruction;Gait training;Stair training;Functional mobility training;Therapeutic activities;Therapeutic exercise;Balance training;Neuromuscular re-education;Patient/family education    PT Goals (Current goals can be found in the Care Plan section)  Acute Rehab PT Goals Patient Stated Goal: to decrease pain  PT Goal Formulation: With patient Time For Goal Achievement: 09/09/19 Potential to Achieve Goals: Good    Frequency 7X/week   Barriers to discharge        Co-evaluation               AM-PAC PT "6 Clicks" Mobility  Outcome Measure Help needed turning from your back to your side while in a flat bed without using bedrails?: None Help needed moving from lying on your back to sitting on the side of a flat bed without using bedrails?: None Help needed moving to  and from a bed to a chair (including a wheelchair)?: A Little Help needed standing up from a chair using your arms (e.g., wheelchair or bedside chair)?: A Little Help needed to walk in hospital room?: A Little Help needed climbing 3-5 steps with a railing? : A Lot 6 Click Score: 19    End of Session Equipment Utilized During Treatment: Gait belt Activity Tolerance: Patient limited by pain Patient left: in chair;with call Jonavon Trieu/phone within reach;with family/visitor present Nurse Communication: Mobility status;Other (comment) (nausea medications) PT Visit Diagnosis: Unsteadiness on feet (R26.81);Muscle weakness (generalized) (M62.81);Difficulty in walking, not elsewhere classified (R26.2);Pain Pain - Right/Left: Left Pain - part of body: Leg    Time: 1441-1516 PT Time Calculation (min) (ACUTE ONLY): 35 min   Charges:   PT Evaluation $PT Eval Low Complexity: 1 Low PT Treatments $Gait Training: 8-22 mins       Gloriann Loan, SPT  Acute Rehabilitation Services  Office: [phone]  08/26/2019, 6:44 PM

## 2019-08-26 NOTE — Care Management (Signed)
Tiffany with [facility] will accept for HHPT.   Magdalen Spatz RN

## 2019-08-26 NOTE — Anesthesia Procedure Notes (Signed)
Procedure Name: MAC Date/Time: 08/26/2019 7:20 AM Performed by: Griffin Dakin, CRNA Pre-anesthesia Checklist: Patient identified, Emergency Drugs available, Suction available and Patient being monitored Patient Re-evaluated:Patient Re-evaluated prior to induction Oxygen Delivery Method: Simple face mask Induction Type: IV induction Number of attempts: 1 Placement Confirmation: positive ETCO2 and breath sounds checked- equal and bilateral Dental Injury: Teeth and Oropharynx as per pre-operative assessment

## 2019-08-26 NOTE — Anesthesia Procedure Notes (Signed)
Spinal  Patient location during procedure: OR Start time: 08/26/2019 7:23 AM End time: 08/26/2019 7:28 AM Staffing Performed: anesthesiologist  Anesthesiologist: Roderic Palau, MD Preanesthetic Checklist Completed: patient identified, IV checked, risks and benefits discussed, surgical consent, monitors and equipment checked, pre-op evaluation and timeout performed Spinal Block Patient position: sitting Prep: DuraPrep Patient monitoring: cardiac monitor, continuous pulse ox and blood pressure Approach: midline Location: L3-4 Injection technique: single-shot Needle Needle type: Pencan  Needle gauge: 24 G Needle length: 9 cm Assessment Sensory level: T8 Additional Notes Functioning IV was confirmed and monitors were applied. Sterile prep and drape, including hand hygiene and sterile gloves were used. The patient was positioned and the spine was prepped. The skin was anesthetized with lidocaine.  Free flow of clear CSF was obtained prior to injecting local anesthetic into the CSF.  The spinal needle aspirated freely following injection.  The needle was carefully withdrawn.  The patient tolerated the procedure well.

## 2019-08-26 NOTE — Transfer of Care (Signed)
Immediate Anesthesia Transfer of Care Note  Patient: Rosio Weiss Murphy-McKellar  Procedure(s) Performed: LEFT TOTAL HIP ARTHROPLASTY ANTERIOR APPROACH (Left Hip)  Patient Location: PACU  Anesthesia Type:MAC and Spinal  Level of Consciousness: awake, alert  and oriented  Airway & Oxygen Therapy: Patient Spontanous Breathing  Post-op Assessment: Report given to RN and Post -op Vital signs reviewed and stable  Post vital signs: Reviewed and stable  Last Vitals:  Vitals Value Taken Time  BP 100/77 08/26/19 0930  Temp 36.1 C 08/26/19 0930  Pulse 59 08/26/19 0932  Resp 16 08/26/19 0932  SpO2 98 % 08/26/19 0932  Vitals shown include unvalidated device data.  Last Pain:  Vitals:   08/26/19 0609  TempSrc: Oral  PainSc: 6       Patients Stated Pain Goal: 2 (12/52/71 2929)  Complications: No complications documented.

## 2019-08-26 NOTE — Progress Notes (Signed)
Called patient tonight.  She is doing well overall, just a little sleepy.  First session of PT went well with some limitation due to pain.  Plan at this time is for PT in the morning and discharge home in the afternoon.    Azucena Cecil, MD [facility] [phone] 8:24 PM

## 2019-08-27 DIAGNOSIS — M87052 Idiopathic aseptic necrosis of left femur: Secondary | ICD-10-CM | POA: Diagnosis not present

## 2019-08-27 LAB — BASIC METABOLIC PANEL
Anion gap: 12 (ref 5–15)
BUN: 43 mg/dL — ABNORMAL HIGH (ref 8–23)
CO2: 15 mmol/L — ABNORMAL LOW (ref 22–32)
Calcium: 8.9 mg/dL (ref 8.9–10.3)
Chloride: 105 mmol/L (ref 98–111)
Creatinine, Ser: 4.57 mg/dL — ABNORMAL HIGH (ref 0.44–1.00)
GFR calc Af Amer: 11 mL/min — ABNORMAL LOW (ref >60)
GFR calc non Af Amer: 10 mL/min — ABNORMAL LOW (ref >60)
Glucose, Bld: 82 mg/dL (ref 70–99)
Potassium: 5.1 mmol/L (ref 3.5–5.1)
Sodium: 132 mmol/L — ABNORMAL LOW (ref 135–145)

## 2019-08-27 LAB — GLUCOSE, CAPILLARY: Glucose-Capillary: 103 mg/dL — ABNORMAL HIGH (ref 70–99)

## 2019-08-27 LAB — CBC
HCT: 35.5 % — ABNORMAL LOW (ref 36.0–46.0)
Hemoglobin: 10.4 g/dL — ABNORMAL LOW (ref 12.0–15.0)
MCH: 25.5 pg — ABNORMAL LOW (ref 26.0–34.0)
MCHC: 29.3 g/dL — ABNORMAL LOW (ref 30.0–36.0)
MCV: 87 fL (ref 80.0–100.0)
Platelets: 171 10*3/uL (ref 150–400)
RBC: 4.08 MIL/uL (ref 3.87–5.11)
RDW: 14.1 % (ref 11.5–15.5)
WBC: 8.6 10*3/uL (ref 4.0–10.5)
nRBC: 0 % (ref 0.0–0.2)

## 2019-08-27 NOTE — Plan of Care (Signed)
Pt doing well. Pt and husband given D/C instructions with verbal understanding. Rx's were sent to the pharmacy by MD. Pt's incision is clean and dry with no sign of infection. Pt's IV was removed prior to D/C. Pt's Home Health was arranged and RW and 3-n-1 were given to the Pt per MD order. Pt D/C'd home via wheelchair per MD order. Pt is stable @ D/C and has no other needs at this time. Holli Humbles, RN

## 2019-08-27 NOTE — Progress Notes (Signed)
Physical Therapy Treatment Patient Details Name: Colleen Obrien MRN: [id number] DOB: [birth year] Today's Date: 08/27/2019    History of Present Illness  Pt is a 64 y.o. female who presents for surgical treatment of Avascular Necrosis Left Hip - s/p L THA direct anterior approach. Pt has PMH of anemia, asthma, lupus, Gout, HTN, and Pneumonia     PT Comments    Pt is continuing to progress towards goals. Pt required supervision only for verbal cueing for bed mobility. Pt required min guard assist for transfers and ambulation. Pt required +2 min assist for safety for stair navigation with a RW. Pt and husband were educated on proper technique and safety precautions with stair navigation and RW. Recommendations for HHPT are appropriate for her. Pt will continue to benefit from acute therapy needs to return her to more independence with functional mobility task. Will continue to follow    Follow Up Recommendations  Follow surgeon's recommendation for DC plan and follow-up therapies     Equipment Recommendations  Rolling walker with 5" wheels;3in1 (PT)    Recommendations for Other Services       Precautions / Restrictions Precautions Precautions: None Restrictions Weight Bearing Restrictions: Yes LLE Weight Bearing: Weight bearing as tolerated    Mobility  Bed Mobility Overal bed mobility: Needs Assistance Bed Mobility: Supine to Sit     Supine to sit: Supervision     General bed mobility comments: pt required verbal cueing for rolling technique. pt favored helicopter technique for transition off R side of bed  Transfers Overall transfer level: Needs assistance Equipment used: Rolling walker (2 wheeled) Transfers: Sit to/from Stand Sit to Stand: Min guard         General transfer comment: pt required min guard assist for lift assist and RW with sit<>stand transfer. Pt also required verbal cueing for proper hand placement with RW and power up  positioning  Ambulation/Gait Ambulation/Gait assistance: Min guard Gait Distance (Feet): 40 Feet Assistive device: Rolling walker (2 wheeled) Gait Pattern/deviations: Decreased step length - right;Decreased step length - left;Decreased stance time - left;Decreased stride length;Decreased dorsiflexion - right;Decreased dorsiflexion - left;Shuffle;Step-to pattern Gait velocity: decreased   General Gait Details: pt demonstrated very slow and guarded gait mainly due to pain in LLE. Required cues for sequencing using RW.    Stairs Stairs: Yes Stairs assistance: Min assist;+2 safety/equipment Stair Management: No rails;Backwards;Step to pattern;With walker Number of Stairs: 3 General stair comments: pt and husband were educated on stair navigation with RW for safety at home. Pt required +2 min guard to min assist with stair navigation with RW. pt required multiple verbal cues for stair sequencing    Wheelchair Mobility    Modified Rankin (Stroke Patients Only)       Balance Overall balance assessment: Needs assistance Sitting-balance support: Feet supported;No upper extremity supported Sitting balance-Leahy Scale: Good Sitting balance - Comments: pt able to sit at EOB without UE support while gait belt and gown was applied.    Standing balance support: During functional activity;No upper extremity supported Standing balance-Leahy Scale: Fair Standing balance comment: pt was able to stand without UE support while gait belt was readjusted.                             Cognition Arousal/Alertness: Awake/alert Behavior During Therapy: WFL for tasks assessed/performed Overall Cognitive Status: Within Functional Limits for tasks assessed  Exercises General Exercises - Lower Extremity Ankle Circles/Pumps: AROM;Both;10 reps;Supine Quad Sets: AROM;5 reps;Left;Supine Long Arc Quad: AROM;5 reps;Left;Seated Heel Slides:  AROM;Right;5 reps;Supine    General Comments        Pertinent Vitals/Pain Pain Assessment: 0-10 Pain Score: 6  Pain Location: L hip Pain Descriptors / Indicators: Grimacing;Guarding;Operative site guarding Pain Intervention(s): Ice applied;Monitored during session;Limited activity within patient's tolerance    Home Living                      Prior Function            PT Goals (current goals can now be found in the care plan section) Acute Rehab PT Goals Patient Stated Goal: to decrease pain  PT Goal Formulation: With patient Time For Goal Achievement: 09/09/19 Potential to Achieve Goals: Good Progress towards PT goals: Progressing toward goals    Frequency    7X/week      PT Plan Current plan remains appropriate    Co-evaluation              AM-PAC PT "6 Clicks" Mobility   Outcome Measure  Help needed turning from your back to your side while in a flat bed without using bedrails?: None Help needed moving from lying on your back to sitting on the side of a flat bed without using bedrails?: None Help needed moving to and from a bed to a chair (including a wheelchair)?: A Little Help needed standing up from a chair using your arms (e.g., wheelchair or bedside chair)?: A Little Help needed to walk in hospital room?: A Little Help needed climbing 3-5 steps with a railing? : A Little 6 Click Score: 20    End of Session Equipment Utilized During Treatment: Gait belt Activity Tolerance: Patient limited by pain Patient left: in chair;with call Colleen Obrien/phone within reach;with family/visitor present Nurse Communication: Mobility status PT Visit Diagnosis: Unsteadiness on feet (R26.81);Muscle weakness (generalized) (M62.81);Difficulty in walking, not elsewhere classified (R26.2);Pain Pain - Right/Left: Left Pain - part of body: Hip     Time: 8101-7510 PT Time Calculation (min) (ACUTE ONLY): 33 min  Charges:  $Gait Training: 8-22 mins $Therapeutic  Exercise: 8-22 mins                     Colleen Obrien, SPT  Acute Rehabilitation Services  Office: [phone]  08/27/2019, 12:58 PM

## 2019-08-27 NOTE — Discharge Summary (Signed)
Patient ID: Colleen Obrien MRN: [id number] DOB/AGE: [birth year] 64 y.o.  Admit date: 08/26/2019 Discharge date: 08/27/2019  Admission Diagnoses:  AVN (avascular necrosis of bone) ([place])  Discharge Diagnoses:  Principal Problem:   AVN (avascular necrosis of bone) ([place]) Active Problems:   Status post total replacement of left hip   Past Medical History:  Diagnosis Date  . Anemia   . Arthritis   . Asthma   . Blood dyscrasia    systemic lupus  . Chronic kidney disease   . Dyspnea   . GERD (gastroesophageal reflux disease)   . Gout   . Hypertension   . Lupus ([place])   . Narcolepsy   . Pneumonia   . Sleep apnea     Surgeries: Procedure(s): LEFT TOTAL HIP ARTHROPLASTY ANTERIOR APPROACH on 08/26/2019   Consultants (if any):   Discharged Condition: Improved  Hospital Course: Colleen Obrien is an 64 y.o. female who was admitted 08/26/2019 with a diagnosis of AVN (avascular necrosis of bone) ([place]) and went to the operating room on 08/26/2019 and underwent the above named procedures.    She was given perioperative antibiotics:  Anti-infectives (From admission, onward)   Start     Dose/Rate Route Frequency Ordered Stop   08/27/19 1000  hydroxychloroquine (PLAQUENIL) tablet 200 mg     Discontinue     200 mg Oral Daily 08/26/19 1043     08/26/19 2200  ceFAZolin (ANCEF) IVPB 2g/100 mL premix        2 g 200 mL/hr over 30 Minutes Intravenous  Once 08/26/19 1043 08/26/19 2104   08/26/19 0811  vancomycin (VANCOCIN) powder  Status:  Discontinued          As needed 08/26/19 0811 08/26/19 0925   08/26/19 0600  ceFAZolin (ANCEF) IVPB 2g/100 mL premix        2 g 200 mL/hr over 30 Minutes Intravenous On call to O.R. 08/26/19 0550 08/26/19 0740   08/26/19 0000  cephALEXin (KEFLEX) 500 MG capsule     Discontinue     500 mg Oral 4 times daily 08/26/19 0706      .  She was given sequential compression devices, early ambulation, and appropriate chemoprophylaxis for  DVT prophylaxis.  She benefited maximally from the hospital stay and there were no complications.    Recent vital signs:  Vitals:   08/27/19 0745 08/27/19 1115  BP: (!) 125/98 (!) 156/96  Pulse: 90 101  Resp: 18 19  Temp: 98.7 F (37.1 C) 98.1 F (36.7 C)  SpO2: 98% 98%    Recent laboratory studies:  Lab Results  Component Value Date   HGB 10.4 (L) 08/27/2019   HGB 11.2 (L) 08/21/2019   HGB 10.0 (L) 06/06/2018   Lab Results  Component Value Date   WBC 8.6 08/27/2019   PLT 171 08/27/2019   Lab Results  Component Value Date   INR 1.0 08/21/2019   Lab Results  Component Value Date   NA 132 (L) 08/27/2019   K 5.1 08/27/2019   CL 105 08/27/2019   CO2 15 (L) 08/27/2019   BUN 43 (H) 08/27/2019   CREATININE 4.57 (H) 08/27/2019   GLUCOSE 82 08/27/2019    Discharge Medications:   Allergies as of 08/27/2019      Reactions   Erythromycin Nausea And Vomiting   Levofloxacin    Causes muscle pain    Sulfa Antibiotics Hives      Medication List    TAKE these medications  acetaminophen 500 MG tablet Commonly known as: TYLENOL Take 500 mg by mouth every 6 (six) hours as needed (for pain.).   albuterol 108 (90 Base) MCG/ACT inhaler Commonly known as: VENTOLIN HFA Inhale into the lungs every 6 (six) hours as needed for wheezing or shortness of breath.   amphetamine-dextroamphetamine 5 MG tablet Commonly known as: ADDERALL Take 5-10 mg by mouth See admin instructions. Take 2 tablets (10 mg) by mouth in the morning & take 1 tablet (5 mg) by mouth in the afternoon.   aspirin EC 81 MG tablet Take 1 tablet (81 mg total) by mouth 2 (two) times daily.   cephALEXin 500 MG capsule Commonly known as: KEFLEX Take 1 capsule (500 mg total) by mouth 4 (four) times daily.   colchicine 0.6 MG tablet Take 0.6 mg by mouth daily.   denosumab 60 MG/ML Soln injection Commonly known as: PROLIA Inject 60 mg into the skin every 6 (six) months. Administer in upper arm, thigh, or  abdomen   Febuxostat 80 MG Tabs Take 80 mg by mouth daily.   furosemide 80 MG tablet Commonly known as: LASIX Take 40-80 mg by mouth daily as needed (fluid retention.).   hydroxychloroquine 200 MG tablet Commonly known as: PLAQUENIL Take 200 mg by mouth daily.   methocarbamol 750 MG tablet Commonly known as: ROBAXIN Take 1 tablet (750 mg total) by mouth 2 (two) times daily as needed for muscle spasms.   metoprolol succinate 50 MG 24 hr tablet Commonly known as: TOPROL-XL Take 50 mg by mouth daily.   mycophenolate 500 MG tablet Commonly known as: CELLCEPT Take 1,000 mg by mouth 2 (two) times daily.   ondansetron 4 MG tablet Commonly known as: ZOFRAN Take 1-2 tablets (4-8 mg total) by mouth every 8 (eight) hours as needed for nausea or vomiting.   oxyCODONE-acetaminophen 5-325 MG tablet Commonly known as: Percocet Take 1-2 tablets by mouth every 8 (eight) hours as needed for severe pain.   spironolactone 25 MG tablet Commonly known as: ALDACTONE Take 25 mg by mouth daily.   Symbicort 80-4.5 MCG/ACT inhaler Generic drug: budesonide-formoterol Inhale 2 puffs into the lungs in the morning and at bedtime.   Vitamin D3 50 MCG (2000 UT) Tabs Take 2,000 Units by mouth daily.            Durable Medical Equipment  (From admission, onward)         Start     Ordered   08/26/19 1044  DME Walker rolling  Once       Question:  Patient needs a walker to treat with the following condition  Answer:  History of hip replacement   08/26/19 1043   08/26/19 1044  DME 3 n 1  Once        08/26/19 1043   08/26/19 1044  DME Bedside commode  Once       Question:  Patient needs a bedside commode to treat with the following condition  Answer:  History of hip replacement   08/26/19 1043          Diagnostic Studies: DG Chest 2 View  Result Date: 08/22/2019 CLINICAL DATA:  Preop for left total hip arthroplasty. EXAM: CHEST - 2 VIEW COMPARISON:  Jun 06, 2018. FINDINGS: The heart  size and mediastinal contours are within normal limits. Stable hiatal hernia. Both lungs are clear. The visualized skeletal structures are unremarkable. IMPRESSION: No active cardiopulmonary disease. Electronically Signed   By: Marijo Conception M.D.   On:  08/22/2019 10:51   DG Pelvis Portable  Result Date: 08/26/2019 CLINICAL DATA:  Left hip replacement EXAM: PORTABLE PELVIS 1-2 VIEWS COMPARISON:  08/26/2019 FINDINGS: Left hip replacement in satisfactory position alignment. No fracture or complication. Calcified uterine fibroid on the right. IMPRESSION: Satisfactory left hip replacement. Electronically Signed   By: Franchot Gallo M.D.   On: 08/26/2019 10:38   DG C-Arm 1-60 Min  Result Date: 08/26/2019 CLINICAL DATA:  Left hip replacement. EXAM: OPERATIVE left HIP (WITH PELVIS IF PERFORMED) 2 VIEWS TECHNIQUE: Fluoroscopic spot image(s) were submitted for interpretation post-operatively. COMPARISON:  MR left hip 07/05/2019 and left hip series 05/14/2019. FINDINGS: 2 intraoperative frontal views of the left hip show left hip arthroplasty. Femoral stem is appropriately positioned within shaft of the proximal left femur. IMPRESSION: Intraoperative visualization of left hip arthroplasty. Electronically Signed   By: Lorin Picket M.D.   On: 08/26/2019 09:25   DG HIP OPERATIVE UNILAT W OR W/O PELVIS LEFT  Result Date: 08/26/2019 CLINICAL DATA:  Left hip replacement. EXAM: OPERATIVE left HIP (WITH PELVIS IF PERFORMED) 2 VIEWS TECHNIQUE: Fluoroscopic spot image(s) were submitted for interpretation post-operatively. COMPARISON:  MR left hip 07/05/2019 and left hip series 05/14/2019. FINDINGS: 2 intraoperative frontal views of the left hip show left hip arthroplasty. Femoral stem is appropriately positioned within shaft of the proximal left femur. IMPRESSION: Intraoperative visualization of left hip arthroplasty. Electronically Signed   By: Lorin Picket M.D.   On: 08/26/2019 09:25    Disposition: Discharge  disposition: 01-Home or Self Care       Discharge Instructions    Call MD / Call 911   Complete by: As directed    If you experience chest pain or shortness of breath, CALL 911 and be transported to the hospital emergency room.  If you develope a fever above 101.5 F, pus (white drainage) or increased drainage or redness at the wound, or calf pain, call your surgeon's office.   Constipation Prevention   Complete by: As directed    Drink plenty of fluids.  Prune juice may be helpful.  You may use a stool softener, such as Colace (over the counter) 100 mg twice a day.  Use MiraLax (over the counter) for constipation as needed.   Driving restrictions   Complete by: As directed    No driving while taking narcotic pain meds.   Increase activity slowly as tolerated   Complete by: As directed          Signed: Eduard Roux 08/27/2019, 12:31 PM

## 2019-08-27 NOTE — Progress Notes (Signed)
Physical Therapy Treatment & Discharge Patient Details  Name: Colleen Obrien MRN: [id number] DOB: 1955-07-19 Today's Date: 08/27/2019    History of Present Illness  Pt is a 64 y.o. female who presents for surgical treatment of Avascular Necrosis Left Hip - s/p L THA direct anterior approach. Pt has PMH of anemia, asthma, lupus, Gout, HTN, and Pneumonia     PT Comments    Pt has progressed well and met all her goals. Pt demonstrated modified independence with bed mobility and transfers with a RW. Pt required supervision assistance with ambulation and stair navigation with a RW. Pt and husband verbally confirmed and demonstrated understanding of HEP and stair navigation education. Will discharge pt from acute therapy care. If anything changes, please reconsult [name].     Follow Up Recommendations  Follow surgeon's recommendation for DC plan and follow-up therapies     Equipment Recommendations  Rolling walker with 5" wheels;3in1 (PT)    Recommendations for Other Services       Precautions / Restrictions Precautions Precautions: None Restrictions Weight Bearing Restrictions: Yes LLE Weight Bearing: Weight bearing as tolerated    Mobility  Bed Mobility Overal bed mobility: Modified Independent Bed Mobility: Supine to Sit     Supine to sit: Modified independent (Device/Increase time)     General bed mobility comments: pt utilized helicopter technique for supine to sit with mod I  Transfers Overall transfer level: Modified independent Equipment used: Rolling walker (2 wheeled) Transfers: Sit to/from [employer] to Stand: Modified independent (Device/Increase time) Stand pivot transfers: Modified independent (Device/Increase time)       General transfer comment: pt required increased time with power up but was mod independent for sit<>stand and stand pivot transfer  Ambulation/Gait Ambulation/Gait assistance: Supervision;Min guard Gait  Distance (Feet): 150 Feet Assistive device: Rolling walker (2 wheeled) Gait Pattern/deviations: Decreased step length - right;Decreased step length - left;Decreased stance time - left;Decreased stride length;Decreased dorsiflexion - right;Decreased dorsiflexion - left;Shuffle;Step-to pattern;Step-through pattern Gait velocity: decreased Gait velocity interpretation: <1.31 ft/sec, indicative of household ambulator General Gait Details: pt demonstrated very slow, step to and guarded gait with min guard assist initially. pt required verbal cues for lenghtening stride and keeping toes up with initial contact phase of gait. as session progressed, pt was able to demonstrate better gait mechanics and RW sequencing with supervision   Stairs Stairs: Yes Stairs assistance: Supervision;+2 safety/equipment Stair Management: No rails;Backwards;Step to pattern;With walker Number of Stairs: 3 General stair comments: pt and husband were able to demonstrate stair navigation with proper technique under supervision assistance.    Wheelchair Mobility    Modified Rankin (Stroke Patients Only)       Balance Overall balance assessment: Mild deficits observed, not formally tested Sitting-balance support: Feet supported;No upper extremity supported Sitting balance-Leahy Scale: Good Sitting balance - Comments: pt able to sit at EOB without UE support while gait belt and gown was applied.    Standing balance support: During functional activity;No upper extremity supported Standing balance-Leahy Scale: Fair Standing balance comment: pt could stand without UE support                             Cognition Arousal/Alertness: Awake/alert Behavior During Therapy: WFL for tasks assessed/performed Overall Cognitive Status: Within Functional Limits for tasks assessed  Exercises General Exercises - Lower Extremity Ankle Circles/Pumps:  AROM;Both;Supine;5 reps Quad Sets: AROM;5 reps;Left;Supine Long Arc Quad: AROM;5 reps;Left;Seated Heel Slides: AROM;Right;5 reps;Supine Hip ABduction/ADduction: AROM;Left;5 reps;Supine    General Comments        Pertinent Vitals/Pain Pain Assessment: Faces Faces Pain Scale: Hurts little more Pain Location: L hip Pain Descriptors / Indicators: Grimacing;Guarding;Operative site guarding Pain Intervention(s): Limited activity within patient's tolerance;Monitored during session    Home Living                      Prior Function            PT Goals (current goals can now be found in the care plan section) Acute Rehab PT Goals Patient Stated Goal: get home PT Goal Formulation: With patient Time For Goal Achievement: 09/09/19 Potential to Achieve Goals: Good Progress towards PT goals: Goals met/education completed, patient discharged from PT    Frequency    7X/week      PT Plan Current plan remains appropriate    Co-evaluation              AM-PAC PT "6 Clicks" Mobility   Outcome Measure  Help needed turning from your back to your side while in a flat bed without using bedrails?: None Help needed moving from lying on your back to sitting on the side of a flat bed without using bedrails?: None Help needed moving to and from a bed to a chair (including a wheelchair)?: None Help needed standing up from a chair using your arms (e.g., wheelchair or bedside chair)?: None Help needed to walk in hospital room?: A Little Help needed climbing 3-5 steps with a railing? : A Little 6 Click Score: 22    End of Session Equipment Utilized During Treatment: Gait belt Activity Tolerance: Patient tolerated treatment well Patient left: with call Gertha Lichtenberg/phone within reach;with family/visitor present;in bed Nurse Communication: Mobility status PT Visit Diagnosis: Muscle weakness (generalized) (M62.81);Difficulty in walking, not elsewhere classified (R26.2);Pain Pain -  Right/Left: Left Pain - part of body: Hip     Time: 2174-7159 PT Time Calculation (min) (ACUTE ONLY): 34 min  Charges:  $Gait Training: 8-22 mins $Therapeutic Exercise: 8-22 mins                     Gloriann Loan, SPT  Acute Rehabilitation Services  Office: [phone]  08/27/2019, 3:36 PM

## 2019-08-27 NOTE — Progress Notes (Signed)
   Subjective:  Patient reports pain as mild.  No events.  A little drowsy.  Objective:   VITALS:   Vitals:   08/26/19 2326 08/27/19 0402 08/27/19 0745 08/27/19 1115  BP: (!) 143/88 (!) 123/95 (!) 125/98 (!) 156/96  Pulse: 78 86 90 101  Resp: 18 18 18 19   Temp: 97.9 F (36.6 C) 98.6 F (37 C) 98.7 F (37.1 C) 98.1 F (36.7 C)  TempSrc:  Oral Oral Oral  SpO2: 99% 96% 98% 98%  Weight:      Height:        Neurovascular intact Sensation intact distally Intact pulses distally Dorsiflexion/Plantar flexion intact Incision: dressing C/D/I and no drainage   Lab Results  Component Value Date   WBC 8.6 08/27/2019   HGB 10.4 (L) 08/27/2019   HCT 35.5 (L) 08/27/2019   MCV 87.0 08/27/2019   PLT 171 08/27/2019     Assessment/Plan:  1 Day Post-Op   - Expected postop acute blood loss anemia - will monitor for symptoms - Up with PT/OT - DVT ppx - SCDs, ambulation, aspirin - WBAT operative extremity - Pain control - Discharge planning - home today if she does well with next PT session   Eduard Roux 08/27/2019, 12:30 PM [phone]

## 2019-08-27 NOTE — Care Management (Signed)
Notified [facility] of DC.

## 2019-08-28 ENCOUNTER — Encounter (HOSPITAL_COMMUNITY): Payer: Self-pay | Admitting: Orthopaedic Surgery

## 2019-08-29 ENCOUNTER — Other Ambulatory Visit: Payer: Self-pay | Admitting: Orthopaedic Surgery

## 2019-08-29 ENCOUNTER — Telehealth: Payer: Self-pay | Admitting: *Deleted

## 2019-08-29 MED ORDER — PROMETHAZINE HCL 25 MG PO TABS
25.0000 mg | ORAL_TABLET | Freq: Four times a day (QID) | ORAL | 1 refills | Status: DC | PRN
Start: 2019-08-29 — End: 2022-04-28

## 2019-08-29 MED ORDER — DOXYCYCLINE HYCLATE 100 MG PO TABS: 100.0000 mg | ORAL_TABLET | Freq: Two times a day (BID) | ORAL | 0 refills | Status: DC

## 2019-08-29 NOTE — Telephone Encounter (Signed)
Received call from [facility] noting that patient is having some GI upset, Nausea and diarrhea. Currently prescribed Keflex QID. Also on Hydroxychloroquine and Zofran. Requested if there were other recommendations. Discussed with Dr. Erlinda Hong. Abx changed to Doxycycline and Phenergan for nausea. Updated therapist with [facility], who will call patient and go over instructions to stop Keflex and Zofran and pick up new medications at pharmacy.

## 2019-09-03 ENCOUNTER — Telehealth: Payer: Self-pay

## 2019-09-03 NOTE — Telephone Encounter (Signed)
She should stop taking the doxy. Thanks.

## 2019-09-03 NOTE — Telephone Encounter (Signed)
Colleen Obrien called states patient has been sick vomiting and has been having nausea. She is taking Doxycycline. Has been taking Phenergan & Zofran and does not help.   Colleen Obrien would like for [name] to call patient with next step.

## 2019-09-03 NOTE — Telephone Encounter (Signed)
Patient aware.

## 2019-09-10 ENCOUNTER — Ambulatory Visit (INDEPENDENT_AMBULATORY_CARE_PROVIDER_SITE_OTHER): Payer: 59 | Admitting: Physician Assistant

## 2019-09-10 DIAGNOSIS — Z96642 Presence of left artificial hip joint: Secondary | ICD-10-CM

## 2019-09-10 NOTE — Progress Notes (Signed)
   Post-Op Visit Note   Patient: Colleen Obrien           Date of Birth: Apr 24, 1955           MRN: [id number] Visit Date: 09/10/2019 PCP: Carol Ada, MD   Assessment & Plan:  Chief Complaint:  Chief Complaint  Patient presents with  . Left Hip - Routine Post Op   Visit Diagnoses:  1. History of total left hip replacement     Plan: Patient is a pleasant 64 year old female who comes in today 2 weeks out left total hip replacement 08/26/2019.  She has been doing well.  She has been in home health physical therapy making fantastic progress.  Minimal to no pain.  Examination of the left hip reveals a well-healing surgical incision with nylon sutures in place.  No evidence of infection or cellulitis.  Calves are soft nontender.  She is neurovascular intact distally.  Today, sutures were removed and Steri-Strips applied.  She will continue with her home exercise program.  Dental prophylaxis reinforced.  Follow-up with [name] in 4 weeks time for repeat evaluation AP pelvis lateral left hip x-rays.  Follow-Up Instructions: Return in about 4 weeks (around 10/08/2019).   Orders:  No orders of the defined types were placed in this encounter.  No orders of the defined types were placed in this encounter.   Imaging: No new imaging  PMFS History: Patient Active Problem List   Diagnosis Date Noted  . 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  . AVN (avascular necrosis of bone) ([place]) 07/08/2019  . Diverticulosis of colon without hemorrhage 07/08/2019  . 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  . Tremor 03/02/2017  . Shaking 08/23/2016  . Excessive daytime sleepiness 08/23/2016  . Obstructive sleep apnea 08/23/2016   Past Medical History:  Diagnosis Date  . Anemia   . Arthritis   . Asthma   . Blood dyscrasia    systemic lupus  . Chronic kidney disease   . Dyspnea   . GERD (gastroesophageal reflux disease)   . Gout   . Hypertension   . Lupus ([place])   .  Narcolepsy   . Pneumonia   . Sleep apnea     Family History  Problem Relation Age of Onset  . Hypertension Mother   . Diabetes Mother   . Dementia Father   . Hypertension Father   . Hyperlipidemia Father   . Diabetes Paternal Grandmother   . Alcohol abuse Paternal Grandfather     Past Surgical History:  Procedure Laterality Date  . CESAREAN SECTION    . TOTAL HIP ARTHROPLASTY Left 08/26/2019   Procedure: LEFT TOTAL HIP ARTHROPLASTY ANTERIOR APPROACH;  Surgeon: Leandrew Koyanagi, MD;  Location: [facility];  Service: Orthopedics;  Laterality: Left;  . TUBAL LIGATION     Social History   Occupational History  . Occupation: [employer]  Tobacco Use  . Smoking status: Never Smoker  . Smokeless tobacco: Never Used  Vaping Use  . Vaping Use: Never used  Substance and Sexual Activity  . Alcohol use: No  . Drug use: No  . Sexual activity: Not on file

## 2019-09-20 ENCOUNTER — Telehealth: Payer: Self-pay | Admitting: Family Medicine

## 2019-09-20 NOTE — Telephone Encounter (Signed)
New message:    Patient calling stating some one called her. I did not see a note.

## 2019-09-20 NOTE — Telephone Encounter (Signed)
Please reschedule [email or web address] [email or web address]

## 2019-10-08 ENCOUNTER — Ambulatory Visit (INDEPENDENT_AMBULATORY_CARE_PROVIDER_SITE_OTHER): Payer: 59

## 2019-10-08 ENCOUNTER — Encounter: Payer: Self-pay | Admitting: Orthopaedic Surgery

## 2019-10-08 ENCOUNTER — Ambulatory Visit (INDEPENDENT_AMBULATORY_CARE_PROVIDER_SITE_OTHER): Payer: 59 | Admitting: Orthopaedic Surgery

## 2019-10-08 VITALS — Ht 63.0 in | Wt 193.0 lb

## 2019-10-08 DIAGNOSIS — Z96642 Presence of left artificial hip joint: Secondary | ICD-10-CM | POA: Diagnosis not present

## 2019-10-08 NOTE — Progress Notes (Signed)
   Post-Op Visit Note   Patient: Colleen Obrien           Date of Birth: 1955/12/22           MRN: [id number] Visit Date: 10/08/2019 PCP: Carol Ada, MD   Assessment & Plan:  Chief Complaint:  Chief Complaint  Patient presents with  . Left Hip - Follow-up    Left total hip arthroplasty 08/26/2019   Visit Diagnoses:  1. History of total left hip replacement     Plan: Colleen Obrien is 6 weeks status post left total hip replacement.  She is doing well overall.  She has completed physical therapy and currently doing home exercises.  She feels that she is almost 100% now.  She has been increasing her activity recently.  Surgical scar is fully healed.  Mild swelling around the thigh.  No signs of infection.  Ambulating with a better gait.  Precautions and activities were discussed today.  Dental prophylaxis reinforced.  Recheck in 6 weeks.  Follow-Up Instructions: Return in about 6 weeks (around 11/19/2019).   Orders:  Orders Placed This Encounter  Procedures  . XR HIP UNILAT W OR W/O PELVIS 2-3 VIEWS LEFT   No orders of the defined types were placed in this encounter.   Imaging: XR HIP UNILAT W OR W/O PELVIS 2-3 VIEWS LEFT  Result Date: 10/08/2019 Stable total hip replacement without complication   PMFS History: Patient Active Problem List   Diagnosis Date Noted  . 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  . AVN (avascular necrosis of bone) ([place]) 07/08/2019  . Diverticulosis of colon without hemorrhage 07/08/2019  . 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  . Tremor 03/02/2017  . Shaking 08/23/2016  . Excessive daytime sleepiness 08/23/2016  . Obstructive sleep apnea 08/23/2016   Past Medical History:  Diagnosis Date  . Anemia   . Arthritis   . Asthma   . Blood dyscrasia    systemic lupus  . Chronic kidney disease   . Dyspnea   . GERD (gastroesophageal reflux disease)   . Gout   . Hypertension   . Lupus ([place])   . Narcolepsy   .  Pneumonia   . Sleep apnea     Family History  Problem Relation Age of Onset  . Hypertension Mother   . Diabetes Mother   . Dementia Father   . Hypertension Father   . Hyperlipidemia Father   . Diabetes Paternal Grandmother   . Alcohol abuse Paternal Grandfather     Past Surgical History:  Procedure Laterality Date  . CESAREAN SECTION    . TOTAL HIP ARTHROPLASTY Left 08/26/2019   Procedure: LEFT TOTAL HIP ARTHROPLASTY ANTERIOR APPROACH;  Surgeon: Leandrew Koyanagi, MD;  Location: [facility];  Service: Orthopedics;  Laterality: Left;  . TUBAL LIGATION     Social History   Occupational History  . Occupation: [employer]  Tobacco Use  . Smoking status: Never Smoker  . Smokeless tobacco: Never Used  Vaping Use  . Vaping Use: Never used  Substance and Sexual Activity  . Alcohol use: No  . Drug use: No  . Sexual activity: Not on file

## 2019-11-19 ENCOUNTER — Ambulatory Visit (INDEPENDENT_AMBULATORY_CARE_PROVIDER_SITE_OTHER): Payer: 59 | Admitting: Orthopaedic Surgery

## 2019-11-19 ENCOUNTER — Encounter: Payer: Self-pay | Admitting: Orthopaedic Surgery

## 2019-11-19 ENCOUNTER — Other Ambulatory Visit: Payer: Self-pay

## 2019-11-19 DIAGNOSIS — Z96642 Presence of left artificial hip joint: Secondary | ICD-10-CM

## 2019-11-19 MED ORDER — AMOXICILLIN 500 MG PO CAPS: ORAL_CAPSULE | ORAL | 0 refills | Status: DC

## 2019-11-19 NOTE — Progress Notes (Signed)
   Post-Op Visit Note   Patient: Colleen Obrien           Date of Birth: [birth year]           MRN: [id number] Visit Date: 11/19/2019 PCP: Carol Ada, MD   Assessment & Plan:  Chief Complaint:  Chief Complaint  Patient presents with  . Left Hip - Routine Post Op   Visit Diagnoses:  1. Status post total hip replacement, left     Plan: Patient is a very pleasant 64 year old female who comes in today 3 months out left anterior total hip replacement.  She has been doing excellent.  No complaints.  Examination of her left hip reveals near full hip flexion strength.  She is neurovascular intact distally.  At this point, she will continue to increase  Follow-Up Instructions: Return in about 9 months (around 08/18/2020).   Orders:  No orders of the defined types were placed in this encounter.  Meds ordered this encounter  Medications  . amoxicillin (AMOXIL) 500 MG capsule    Sig: Take 4 pills one hour prior to dental work    Dispense:  12 capsule    Refill:  0    Imaging: No new imaging  PMFS History: Patient Active Problem List   Diagnosis Date Noted  . 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  . AVN (avascular necrosis of bone) ([facility]) 07/08/2019  . Diverticulosis of colon without hemorrhage 07/08/2019  . 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  . Tremor 03/02/2017  . Shaking 08/23/2016  . Excessive daytime sleepiness 08/23/2016  . Obstructive sleep apnea 08/23/2016   Past Medical History:  Diagnosis Date  . Anemia   . Arthritis   . Asthma   . Blood dyscrasia    systemic lupus  . Chronic kidney disease   . Dyspnea   . GERD (gastroesophageal reflux disease)   . Gout   . Hypertension   . Lupus ([place])   . Narcolepsy   . Pneumonia   . Sleep apnea     Family History  Problem Relation Age of Onset  . Hypertension Mother   . Diabetes Mother   . Dementia Father   . Hypertension Father   . Hyperlipidemia Father   . Diabetes Paternal  Grandmother   . Alcohol abuse Paternal Grandfather     Past Surgical History:  Procedure Laterality Date  . CESAREAN SECTION    . TOTAL HIP ARTHROPLASTY Left 08/26/2019   Procedure: LEFT TOTAL HIP ARTHROPLASTY ANTERIOR APPROACH;  Surgeon: Leandrew Koyanagi, MD;  Location: [facility];  Service: Orthopedics;  Laterality: Left;  . TUBAL LIGATION     Social History   Occupational History  . Occupation: Cytotechnologists  Tobacco Use  . Smoking status: Never Smoker  . Smokeless tobacco: Never Used  Vaping Use  . Vaping Use: Never used  Substance and Sexual Activity  . Alcohol use: No  . Drug use: No  . Sexual activity: Not on file

## 2020-01-22 ENCOUNTER — Other Ambulatory Visit: Payer: Self-pay | Admitting: Family Medicine

## 2020-01-22 DIAGNOSIS — Z1231 Encounter for screening mammogram for malignant neoplasm of breast: Secondary | ICD-10-CM

## 2020-03-02 ENCOUNTER — Ambulatory Visit
Admission: RE | Admit: 2020-03-02 | Discharge: 2020-03-02 | Disposition: A | Discharge status: Home / Self Care | Payer: 59 | Source: Ambulatory Visit | Attending: Family Medicine | Admitting: Family Medicine

## 2020-03-02 ENCOUNTER — Other Ambulatory Visit: Payer: Self-pay

## 2020-03-02 DIAGNOSIS — Z1231 Encounter for screening mammogram for malignant neoplasm of breast: Secondary | ICD-10-CM

## 2020-05-25 DIAGNOSIS — Z0271 Encounter for disability determination: Secondary | ICD-10-CM

## 2020-08-18 ENCOUNTER — Ambulatory Visit (INDEPENDENT_AMBULATORY_CARE_PROVIDER_SITE_OTHER): Payer: 59 | Admitting: Orthopaedic Surgery

## 2020-08-18 ENCOUNTER — Ambulatory Visit (INDEPENDENT_AMBULATORY_CARE_PROVIDER_SITE_OTHER): Payer: 59

## 2020-08-18 ENCOUNTER — Encounter: Payer: Self-pay | Admitting: Orthopaedic Surgery

## 2020-08-18 VITALS — Ht 63.0 in | Wt 190.0 lb

## 2020-08-18 DIAGNOSIS — Z96642 Presence of left artificial hip joint: Secondary | ICD-10-CM

## 2020-08-18 MED ORDER — TRAMADOL HCL 50 MG PO TABS: 50.0000 mg | ORAL_TABLET | Freq: Two times a day (BID) | ORAL | 2 refills | Status: DC | PRN

## 2020-08-18 NOTE — Progress Notes (Signed)
Post-Op Visit Note   Patient: Colleen Obrien           Date of Birth: 1955/11/03           MRN: [id number] Visit Date: 08/18/2020 PCP: Carol Ada, MD   Assessment & Plan:  Chief Complaint:  Chief Complaint  Patient presents with   Left Hip - Follow-up    Left total hip arthroplasty 08/26/2019   Visit Diagnoses:  1. Status post total hip replacement, left     Plan: Patient is a very pleasant 65 year old female who comes in today 9 months out left total hip replacement, date of surgery 08/26/2019.  She was doing well until this past weekend when she had increased discomfort to her left hip.  She notes that about a week and a half ago, she tripped going down a set of stairs when she twisted her leg.  She did not have any pain, however until a week later.  This past weekend when her pain started she notes that she may have overdone things as she was throwing her son in a house warming party.  The pain has not improved over the past few days.  The pain is to the anterior thigh and is worse going from a seated to standing position.  She has been taking Tylenol and Motrin with relief of symptoms.  Examination of her left hip reveals a painless logroll.  She has slight increased pain with hip flexion.  She is neurovascular intact distally.  At this point, I think she overdid things.  I have recommended that she take Tylenol and tramadol as needed.  She will follow-up with [name] in 3 months time for recheck.  If her current symptoms do not improve, she will follow-up with [name] sooner.  Dental prophylaxis reinforced.  She will call with any concerns or questions in the meantime.  Follow-Up Instructions: Return in about 3 months (around 11/18/2020).   Orders:  Orders Placed This Encounter  Procedures   XR Pelvis 1-2 Views   Meds ordered this encounter  Medications   traMADol (ULTRAM) 50 MG tablet    Sig: Take 1 tablet (50 mg total) by mouth 2 (two) times daily as needed.    Dispense:   30 tablet    Refill:  2    Imaging: XR Pelvis 1-2 Views  Result Date: 08/18/2020 Well-seated prosthesis without complication   PMFS History: Patient Active Problem List   Diagnosis Date Noted   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   AVN (avascular necrosis of bone) ([facility]) 07/08/2019   Diverticulosis of colon without hemorrhage 07/08/2019   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   Tremor 03/02/2017   Shaking 08/23/2016   Excessive daytime sleepiness 08/23/2016   Obstructive sleep apnea 08/23/2016   Past Medical History:  Diagnosis Date   Anemia    Arthritis    Asthma    Blood dyscrasia    systemic lupus   Chronic kidney disease    Dyspnea    GERD (gastroesophageal reflux disease)    Gout    Hypertension    Lupus (HCC)    Narcolepsy    Pneumonia    Sleep apnea     Family History  Problem Relation Age of Onset   Hypertension Mother    Diabetes Mother    Dementia Father    Hypertension Father    Hyperlipidemia Father    Diabetes Paternal Grandmother    Alcohol abuse Paternal [employer]chant [employer] officer  Past Surgical History:  Procedure Laterality Date   CESAREAN SECTION     TOTAL HIP ARTHROPLASTY Left 08/26/2019   Procedure: LEFT TOTAL HIP ARTHROPLASTY ANTERIOR APPROACH;  Surgeon: Leandrew Koyanagi, MD;  Location: [facility];  Service: Orthopedics;  Laterality: Left;   TUBAL LIGATION     Social History   Occupational History   Occupation: Public affairs consultant  Tobacco Use   Smoking status: Never   Smokeless tobacco: Never  Vaping Use   Vaping Use: Never used  Substance and Sexual Activity   Alcohol use: No   Drug use: No   Sexual activity: Not on file

## 2020-11-18 ENCOUNTER — Ambulatory Visit: Payer: 59 | Admitting: Orthopaedic Surgery

## 2020-11-20 ENCOUNTER — Encounter: Payer: Self-pay | Admitting: Orthopaedic Surgery

## 2020-11-20 ENCOUNTER — Other Ambulatory Visit: Payer: Self-pay

## 2020-11-20 ENCOUNTER — Ambulatory Visit (INDEPENDENT_AMBULATORY_CARE_PROVIDER_SITE_OTHER): Payer: 59 | Admitting: Orthopaedic Surgery

## 2020-11-20 DIAGNOSIS — M7061 Trochanteric bursitis, right hip: Secondary | ICD-10-CM | POA: Diagnosis not present

## 2020-11-20 DIAGNOSIS — Z96642 Presence of left artificial hip joint: Secondary | ICD-10-CM | POA: Diagnosis not present

## 2020-11-20 NOTE — Progress Notes (Signed)
Office Visit Note   Patient: Colleen Obrien           Date of Birth: 12-Aug-1955           MRN: [id number] Visit Date: 11/20/2020              Requested by: Carol Ada, [place],  [place] [id number] PCP: Carol Ada, MD   Assessment & Plan: Visit Diagnoses:  1. Status post total replacement of left hip   2. Trochanteric bursitis, right hip     Plan: Impression is 1 year 43-month status post left total hip replacement and right hip greater trochanteric bursitis and osteoarthritis.  In regards to the left hip, she is doing well.  Dental prophylaxis reinforced for another 9 months.  Follow-up in 9 months time for repeat evaluation AP pelvis x-rays.  In regards to the right hip, we have discussed cortisone injection and IT band stretching.  She would like to hold off on the injection for now and just try the stretching.  Call with concerns or questions in the meantime.  Follow-Up Instructions: Return in about 9 months (around 08/20/2021).   Orders:  No orders of the defined types were placed in this encounter.  No orders of the defined types were placed in this encounter.     Procedures: No procedures performed   Clinical Data: No additional findings.   Subjective: Chief Complaint  Patient presents with   Left Hip - Follow-up    Left total hip arthroplasty 08/26/2019    HPI patient is a pleasant 65 year old female who comes in today a year and 3 months status post left total hip replacement 08/26/2019 in addition to new onset right lateral hip pain for the past few months.  In regards to the left hip, she is doing well.  She notes occasional discomfort when walking or standing for too long.  Otherwise, no complaints.  In regards to the right hip, the pain is to the lateral aspect and hurts worse when she is lying on that side.  No previous cortisone injection to the right hip.  She does have a history of right hip OA.  Review of  Systems as detailed in HPI.  All others reviewed and are negative.   Objective: Vital Signs: There were no vitals taken for this visit.  Physical Exam well-developed well-nourished female in no acute distress.  Alert and oriented x3.  Ortho Exam left hip exam reveals a painless logroll.  Right hip exam shows painless logroll.  Moderate tenderness to the greater trochanter.  She is neurovascular intact distally.  Specialty Comments:  No specialty comments available.  Imaging: No results found.   PMFS History: Patient Active Problem List   Diagnosis Date Noted   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   AVN (avascular necrosis of bone) ([facility]) 07/08/2019   Diverticulosis of colon without hemorrhage 07/08/2019   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   Tremor 03/02/2017   Shaking 08/23/2016   Excessive daytime sleepiness 08/23/2016   Obstructive sleep apnea 08/23/2016   Past Medical History:  Diagnosis Date   Anemia    Arthritis    Asthma    Blood dyscrasia    systemic lupus   Chronic kidney disease    Dyspnea    GERD (gastroesophageal reflux disease)    Gout    Hypertension    Lupus (HCC)    Narcolepsy    Pneumonia    Sleep  apnea     Family History  Problem Relation Age of Onset   Hypertension Mother    Diabetes Mother    Dementia Father    Hypertension Father    Hyperlipidemia Father    Diabetes Paternal Grandmother    Alcohol abuse Paternal Grandfather     Past Surgical History:  Procedure Laterality Date   CESAREAN SECTION     TOTAL HIP ARTHROPLASTY Left 08/26/2019   Procedure: LEFT TOTAL HIP ARTHROPLASTY ANTERIOR APPROACH;  Surgeon: Leandrew Koyanagi, MD;  Location: [facility];  Service: Orthopedics;  Laterality: Left;   TUBAL LIGATION     Social History   Occupational History   Occupation: Public affairs consultant  Tobacco Use   Smoking status: Never   Smokeless tobacco: Never  Vaping Use   Vaping Use: Never used  Substance and Sexual  Activity   Alcohol use: No   Drug use: No   Sexual activity: Not on file

## 2021-03-08 ENCOUNTER — Encounter: Payer: Self-pay | Admitting: Nephrology

## 2021-03-08 ENCOUNTER — Other Ambulatory Visit (HOSPITAL_COMMUNITY): Payer: Self-pay

## 2021-03-08 DIAGNOSIS — N184 Chronic kidney disease, stage 4 (severe): Secondary | ICD-10-CM | POA: Insufficient documentation

## 2021-03-08 DIAGNOSIS — D631 Anemia in chronic kidney disease: Secondary | ICD-10-CM | POA: Insufficient documentation

## 2021-03-09 ENCOUNTER — Other Ambulatory Visit: Payer: Self-pay

## 2021-03-09 DIAGNOSIS — N184 Chronic kidney disease, stage 4 (severe): Secondary | ICD-10-CM

## 2021-03-10 ENCOUNTER — Encounter (HOSPITAL_COMMUNITY)
Admission: RE | Admit: 2021-03-10 | Discharge: 2021-03-10 | Disposition: A | Discharge status: Home / Self Care | Payer: Medicare HMO | Source: Ambulatory Visit | Attending: Nephrology | Admitting: Nephrology

## 2021-03-10 ENCOUNTER — Encounter (HOSPITAL_COMMUNITY): Payer: Self-pay

## 2021-03-10 VITALS — BP 133/90 | HR 71 | Temp 97.3°F | Resp 18

## 2021-03-10 DIAGNOSIS — N189 Chronic kidney disease, unspecified: Secondary | ICD-10-CM | POA: Diagnosis not present

## 2021-03-10 DIAGNOSIS — D631 Anemia in chronic kidney disease: Secondary | ICD-10-CM | POA: Diagnosis present

## 2021-03-10 LAB — POCT HEMOGLOBIN-HEMACUE: Hemoglobin: 8.2 g/dL — ABNORMAL LOW (ref 12.0–15.0)

## 2021-03-10 MED ORDER — EPOETIN ALFA-EPBX 10000 UNIT/ML IJ SOLN
20000.0000 [IU] | Freq: Once | INTRAMUSCULAR | Status: AC
  Administered 2021-03-10: 20000 [IU] via SUBCUTANEOUS

## 2021-03-10 MED ORDER — EPOETIN ALFA-EPBX 10000 UNIT/ML IJ SOLN
INTRAMUSCULAR | Status: AC
  Filled 2021-03-10: qty 2

## 2021-03-10 MED ORDER — EPOETIN ALFA-EPBX 10000 UNIT/ML IJ SOLN: 20000.0000 [IU] | Freq: Once | INTRAMUSCULAR | Status: DC

## 2021-03-12 ENCOUNTER — Other Ambulatory Visit: Payer: Self-pay

## 2021-03-12 ENCOUNTER — Ambulatory Visit (HOSPITAL_COMMUNITY)
Admission: RE | Admit: 2021-03-12 | Discharge: 2021-03-12 | Disposition: A | Discharge status: Home / Self Care | Payer: Medicare HMO | Source: Ambulatory Visit | Attending: Vascular Surgery | Admitting: Vascular Surgery

## 2021-03-12 ENCOUNTER — Ambulatory Visit (INDEPENDENT_AMBULATORY_CARE_PROVIDER_SITE_OTHER)
Admission: RE | Admit: 2021-03-12 | Discharge: 2021-03-12 | Disposition: A | Discharge status: Home / Self Care | Payer: Medicare HMO | Source: Ambulatory Visit | Attending: Vascular Surgery | Admitting: Vascular Surgery

## 2021-03-12 ENCOUNTER — Ambulatory Visit (INDEPENDENT_AMBULATORY_CARE_PROVIDER_SITE_OTHER): Payer: Medicare HMO | Admitting: Vascular Surgery

## 2021-03-12 ENCOUNTER — Encounter: Payer: Self-pay | Admitting: Vascular Surgery

## 2021-03-12 VITALS — BP 163/103 | HR 87 | Temp 98.0°F | Resp 18 | Ht 63.0 in | Wt 198.0 lb

## 2021-03-12 DIAGNOSIS — N184 Chronic kidney disease, stage 4 (severe): Secondary | ICD-10-CM | POA: Insufficient documentation

## 2021-03-12 NOTE — Progress Notes (Addendum)
Office Note     CC:  ES[facility] Requesting Provider:  Rexene Agent, MD  HPI: Colleen Obrien is a Right handed 66 y.o. (1955/07/26) female with kidney disease who presents at the request of Rexene Agent, MD for permanent HD access. The patient has had no prior access procedures. No tunneled lines.   Colleen Obrien presents today accompanied by her husband, and unfortunately she came from the funeral of her husband's great aunt.  Colleen Obrien native, she moved to [place] when she met her husband.  They have grown sons who attended [employer].  Colleen Obrien suffers from lupus as well as has a longstanding history of hypertension, both of which likely contributed to her chronic kidney disease.  She also has narcolepsy.   On exam, Colleen Obrien was doing well with no complaints.  She was aware that her kidneys were failing, and had several questions regarding hemodialysis versus peritoneal dialysis.  Unfortunately, she really recently fractured her right shoulder.  This is being treated nonoperatively but she has very little movement at the joint with significant hand edema.   The pt is  on a statin for cholesterol management.  The pt is  on a daily aspirin.   Other AC:  - The pt is  on medications for hypertension.   The pt is not diabetic. Tobacco hx:  -  Past Medical History:  Diagnosis Date   Anemia in chronic kidney disease (CKD)    Arthritis    Asthma    Blood dyscrasia    systemic lupus   Chronic kidney disease (CKD), active medical management without dialysis, stage 4 (severe) (HCC)    Dyspnea    GERD (gastroesophageal reflux disease)    Gout    Hypertension    Lupus (Colleen Obrien)    Narcolepsy    Pneumonia    Sleep apnea     Past Surgical History:  Procedure Laterality Date   CESAREAN SECTION     TOTAL HIP ARTHROPLASTY Left 08/26/2019   Procedure: LEFT TOTAL HIP ARTHROPLASTY ANTERIOR APPROACH;  Surgeon: Leandrew Koyanagi, MD;  Location: [facility];  Service: Orthopedics;  Laterality: Left;    TUBAL LIGATION      Social History   Socioeconomic History   Marital status: Married    Spouse name: Not on file   Number of children: 2   Years of education: College   Highest education level: Not on file  Occupational History   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   Right-handed.   2-3 cups caffeine daily.   Social Determinants of Health   Financial Resource Strain: Not on file  Food Insecurity: Not on file  Transportation Needs: Not on file  Physical Activity: Not on file  Stress: Not on file  Social Connections: Not on file  Intimate Partner Violence: Not on file    Family History  Problem Relation Age of Onset   Hypertension Mother    Diabetes Mother    Dementia Father    Hypertension Father    Hyperlipidemia Father    Diabetes Paternal Grandmother    Alcohol abuse Paternal Grandfather     Current Outpatient  Medications  Medication Sig Dispense Refill   acetaminophen (TYLENOL) 500 MG tablet Take 500 mg by mouth every 6 (six) hours as needed (for pain.).     albuterol (PROVENTIL HFA;VENTOLIN HFA) 108 (90 BASE) MCG/ACT inhaler Inhale into the lungs every 6 (six) hours as needed for wheezing or shortness of breath.     amphetamine-dextroamphetamine (ADDERALL) 5 MG tablet Take 5-10 mg by mouth See admin instructions. Take 2 tablets (10 mg) by mouth in the morning & take 1 tablet (5 mg) by mouth in the afternoon.  0   aspirin EC 81 MG tablet Take 1 tablet (81 mg total) by mouth 2 (two) times daily. 84 tablet 0   budesonide-formoterol (SYMBICORT) 80-4.5 MCG/ACT inhaler Inhale 2 puffs into the lungs in the morning and at bedtime.     Cholecalciferol (VITAMIN D3) 50 MCG (2000 UT)  TABS Take 2,000 Units by mouth daily.     colchicine 0.6 MG tablet Take 0.6 mg by mouth daily.     denosumab (PROLIA) 60 MG/ML SOLN injection Inject 60 mg into the skin every 6 (six) months. Administer in upper arm, thigh, or abdomen     Febuxostat 80 MG TABS Take 80 mg by mouth daily.     furosemide (LASIX) 80 MG tablet Take 40-80 mg by mouth daily as needed (fluid retention.).      hydroxychloroquine (PLAQUENIL) 200 MG tablet Take 200 mg by mouth daily.      methocarbamol (ROBAXIN) 750 MG tablet Take 1 tablet (750 mg total) by mouth 2 (two) times daily as needed for muscle spasms. 20 tablet 3   metoprolol succinate (TOPROL-XL) 50 MG 24 hr tablet Take 50 mg by mouth daily.     mycophenolate (CELLCEPT) 500 MG tablet Take 1,000 mg by mouth 2 (two) times daily.      promethazine (PHENERGAN) 25 MG tablet Take 1 tablet (25 mg total) by mouth every 6 (six) hours as needed for nausea. 30 tablet 1   spironolactone (ALDACTONE) 25 MG tablet Take 25 mg by mouth daily.     amoxicillin (AMOXIL) 500 MG capsule Take 4 pills one hour prior to dental work (Patient not taking: Reported on 03/12/2021) 12 capsule 0   doxycycline (VIBRA-TABS) 100 MG tablet Take 1 tablet (100 mg total) by mouth 2 (two) times daily. (Patient not taking: Reported on 03/12/2021) 20 tablet 0   felodipine (PLENDIL) 10 MG 24 hr tablet Take 10 mg by mouth daily.     ondansetron (ZOFRAN) 4 MG tablet Take 1-2 tablets (4-8 mg total) by mouth every 8 (eight) hours as needed for nausea or vomiting. (Patient not taking: Reported on 03/12/2021) 20 tablet 0   oxyCODONE-acetaminophen (PERCOCET) 5-325 MG tablet Take 1-2 tablets by mouth every 8 (eight) hours as needed for severe pain. (Patient not taking: Reported on 03/12/2021) 30 tablet 0   traMADol (ULTRAM) 50 MG tablet Take 1 tablet (50 mg total) by mouth 2 (two) times daily as needed. (Patient not taking: Reported on 03/12/2021) 30 tablet 2   No current facility-administered medications for this visit.     Allergies  Allergen Reactions   Erythromycin Nausea And Vomiting   Levofloxacin     Causes muscle pain    Sulfa Antibiotics Hives     REVIEW OF SYSTEMS:   '[X]'$  denotes positive finding, $RemoveBeforeDEI'[ ]'[id number]$  denotes negative finding Cardiac  Comments:  Chest pain or chest pressure:    Shortness of breath upon exertion:    Short of breath when lying flat:  Irregular heart rhythm:        Vascular    Pain in calf, thigh, or hip brought on by ambulation:    Pain in feet at night that wakes you up from your sleep:     Blood clot in your veins:    Leg swelling:         Pulmonary    Oxygen at home:    Productive cough:     Wheezing:         Neurologic    Sudden weakness in arms or legs:     Sudden numbness in arms or legs:     Sudden onset of difficulty speaking or slurred speech:    Temporary loss of vision in one eye:     Problems with dizziness:         Gastrointestinal    Blood in stool:     Vomited blood:         Genitourinary    Burning when urinating:     Blood in urine:        Psychiatric    Major depression:         Hematologic    Bleeding problems:    Problems with blood clotting too easily:        Skin    Rashes or ulcers:        Constitutional    Fever or chills:      PHYSICAL EXAMINATION:  Vitals:   03/12/21 1431  BP: (!) 163/103  Pulse: 87  Resp: 18  Temp: 98 F (36.7 C)  TempSrc: Temporal  SpO2: 95%  Weight: 198 lb (89.8 kg)  Height: $Remove'5\' 3"'yYDHPgu$  (1.6 m)    General:  WDWN in NAD; vital signs documented above Gait: Not observed HENT: WNL, normocephalic Pulmonary: normal non-labored breathing , without Rales, rhonchi,  wheezing Cardiac: regular HR,  Abdomen: soft, NT, no masses Skin: without rashes Vascular Exam/Pulses:  Right Left  Radial 2+ (normal) 2+ (normal)  Ulnar 2+ (normal) 2+ (normal)                   Extremities: without ischemic changes, without Gangrene , without cellulitis; without open wounds;  Musculoskeletal: no muscle  wasting or atrophy  Neurologic: A&O X 3;  No focal weakness or paresthesias are detected Psychiatric:  The pt has Normal affect.   Non-Invasive Vascular Imaging:   Noninvasive vascular imaging demonstrated adequate brachial artery for inflow. Noninvasive venous ultrasound demonstrated small veins bilaterally.    ASSESSMENT/PLAN:  DEBRINA KIZER is a 66 y.o. female who presents with chronic kidney disease stage 4  Based on vein mapping and examination, she will require an AV graft.  Being that she is right-handed, she would be best served with a left-sided graft. Obrien is currently CKD 4, and has time prior to needing an AV graft.  We discussed graft placement, surgery, and dialysis, and I told her I am available at her convenience. She has another appointment with orthopedic surgery next week for right arm evaluation.  In a perfect world, we would move forward with access once the right arm has healed /or becomes more functional. I am happy to perform her AV graft surgery when deemed appropriate by nephrology.  Broadus John, MD [facility] [phone]

## 2021-03-17 DIAGNOSIS — M25511 Pain in right shoulder: Secondary | ICD-10-CM | POA: Diagnosis not present

## 2021-03-31 DIAGNOSIS — M25511 Pain in right shoulder: Secondary | ICD-10-CM | POA: Diagnosis not present

## 2021-03-31 DIAGNOSIS — M2560 Stiffness of unspecified joint, not elsewhere classified: Secondary | ICD-10-CM | POA: Diagnosis not present

## 2021-03-31 DIAGNOSIS — S42291D Other displaced fracture of upper end of right humerus, subsequent encounter for fracture with routine healing: Secondary | ICD-10-CM | POA: Diagnosis not present

## 2021-03-31 DIAGNOSIS — M6281 Muscle weakness (generalized): Secondary | ICD-10-CM | POA: Diagnosis not present

## 2021-04-05 DIAGNOSIS — L300 Nummular dermatitis: Secondary | ICD-10-CM | POA: Diagnosis not present

## 2021-04-05 DIAGNOSIS — L209 Atopic dermatitis, unspecified: Secondary | ICD-10-CM | POA: Diagnosis not present

## 2021-04-06 DIAGNOSIS — I120 Hypertensive chronic kidney disease with stage 5 chronic kidney disease or end stage renal disease: Secondary | ICD-10-CM | POA: Diagnosis not present

## 2021-04-06 DIAGNOSIS — M3214 Glomerular disease in systemic lupus erythematosus: Secondary | ICD-10-CM | POA: Diagnosis not present

## 2021-04-06 DIAGNOSIS — N185 Chronic kidney disease, stage 5: Secondary | ICD-10-CM | POA: Diagnosis not present

## 2021-04-06 DIAGNOSIS — M109 Gout, unspecified: Secondary | ICD-10-CM | POA: Diagnosis not present

## 2021-04-06 DIAGNOSIS — N2581 Secondary hyperparathyroidism of renal origin: Secondary | ICD-10-CM | POA: Diagnosis not present

## 2021-04-07 ENCOUNTER — Ambulatory Visit (HOSPITAL_COMMUNITY)
Admission: RE | Admit: 2021-04-07 | Discharge: 2021-04-07 | Disposition: A | Discharge status: Home / Self Care | Payer: Medicare HMO | Source: Ambulatory Visit | Attending: Nephrology | Admitting: Nephrology

## 2021-04-07 ENCOUNTER — Other Ambulatory Visit: Payer: Self-pay

## 2021-04-07 VITALS — BP 145/103 | HR 78 | Temp 97.1°F | Resp 18

## 2021-04-07 DIAGNOSIS — N189 Chronic kidney disease, unspecified: Secondary | ICD-10-CM | POA: Insufficient documentation

## 2021-04-07 DIAGNOSIS — D631 Anemia in chronic kidney disease: Secondary | ICD-10-CM | POA: Diagnosis present

## 2021-04-07 LAB — RENAL FUNCTION PANEL
Albumin: 3.5 g/dL (ref 3.5–5.0)
Anion gap: 11 (ref 5–15)
BUN: 69 mg/dL — ABNORMAL HIGH (ref 8–23)
CO2: 19 mmol/L — ABNORMAL LOW (ref 22–32)
Calcium: 9.8 mg/dL (ref 8.9–10.3)
Chloride: 106 mmol/L (ref 98–111)
Creatinine, Ser: 4.39 mg/dL — ABNORMAL HIGH (ref 0.44–1.00)
GFR, Estimated: 11 mL/min — ABNORMAL LOW (ref >60)
Glucose, Bld: 85 mg/dL (ref 70–99)
Phosphorus: 5.6 mg/dL — ABNORMAL HIGH (ref 2.5–4.6)
Potassium: 3.7 mmol/L (ref 3.5–5.1)
Sodium: 136 mmol/L (ref 135–145)

## 2021-04-07 LAB — IRON AND TIBC
Iron: 19 ug/dL — ABNORMAL LOW (ref 28–170)
Saturation Ratios: 4 % — ABNORMAL LOW (ref 10.4–31.8)
TIBC: 427 ug/dL (ref 250–450)
UIBC: 408 ug/dL

## 2021-04-07 LAB — POCT HEMOGLOBIN-HEMACUE: Hemoglobin: 8.5 g/dL — ABNORMAL LOW (ref 12.0–15.0)

## 2021-04-07 MED ORDER — EPOETIN ALFA-EPBX 10000 UNIT/ML IJ SOLN
INTRAMUSCULAR | Status: AC
  Filled 2021-04-07: qty 2

## 2021-04-07 MED ORDER — EPOETIN ALFA-EPBX 10000 UNIT/ML IJ SOLN: 20000.0000 [IU] | Freq: Once | INTRAMUSCULAR | Status: DC

## 2021-04-07 MED ORDER — EPOETIN ALFA-EPBX 40000 UNIT/ML IJ SOLN
INTRAMUSCULAR | Status: AC
  Administered 2021-04-07: 20000 [IU]
  Filled 2021-04-07: qty 1

## 2021-04-13 DIAGNOSIS — M6281 Muscle weakness (generalized): Secondary | ICD-10-CM | POA: Diagnosis not present

## 2021-04-13 DIAGNOSIS — M25511 Pain in right shoulder: Secondary | ICD-10-CM | POA: Diagnosis not present

## 2021-04-13 DIAGNOSIS — S42291D Other displaced fracture of upper end of right humerus, subsequent encounter for fracture with routine healing: Secondary | ICD-10-CM | POA: Diagnosis not present

## 2021-04-13 DIAGNOSIS — M2560 Stiffness of unspecified joint, not elsewhere classified: Secondary | ICD-10-CM | POA: Diagnosis not present

## 2021-04-16 ENCOUNTER — Other Ambulatory Visit (HOSPITAL_COMMUNITY): Payer: Self-pay | Admitting: *Deleted

## 2021-04-19 ENCOUNTER — Encounter (HOSPITAL_COMMUNITY): Payer: Medicare HMO

## 2021-04-21 ENCOUNTER — Encounter: Payer: Self-pay | Admitting: Orthopaedic Surgery

## 2021-04-21 ENCOUNTER — Other Ambulatory Visit: Payer: Self-pay

## 2021-04-21 ENCOUNTER — Ambulatory Visit (INDEPENDENT_AMBULATORY_CARE_PROVIDER_SITE_OTHER): Payer: Medicare HMO | Admitting: Orthopaedic Surgery

## 2021-04-21 ENCOUNTER — Ambulatory Visit (INDEPENDENT_AMBULATORY_CARE_PROVIDER_SITE_OTHER): Payer: Medicare HMO

## 2021-04-21 DIAGNOSIS — Z96642 Presence of left artificial hip joint: Secondary | ICD-10-CM | POA: Diagnosis not present

## 2021-04-21 MED ORDER — ACETAMINOPHEN-CODEINE #3 300-30 MG PO TABS: 1.0000 | ORAL_TABLET | Freq: Three times a day (TID) | ORAL | 0 refills | Status: DC | PRN

## 2021-04-21 NOTE — Progress Notes (Signed)
? ?Post-Op Visit Note ?  ?Patient: Colleen Obrien           ?Date of Birth: January 26, 1956           ?MRN: [id number] ?Visit Date: 04/21/2021 ?PCP: Carol Ada, MD ? ? ?Assessment & Plan: ? ?Chief Complaint:  ?Chief Complaint  ?Patient presents with  ? Left Hip - Pain  ? ?Visit Diagnoses:  ?1. Status post total replacement of left hip   ? ? ?Plan: Patient is a pleasant 66 year old female who comes in today almost 2 years status post left total hip replacement.  She was doing well until about 2 weeks ago.  She notes an injury where she fell from her bed back in January landing on the right side.  She did denies having any left hip pain until recently.  The pain she has is primarily to the medial thigh and into the groin.  She has associated weakness to the left lower extremity.  Pain is worse with stair climbing as well as going from a seated to standing position.  She does note she gets relief after walking for a few minutes.  She denies any paresthesias to the left lower extremity.  No lumbar pathology.  Examination of the left hip shows a positive logroll, FADIR and [place].  She has pain with resisted hip flexion, internal and external rotation.  No pain with abduction or abduction.  Unremarkable lumbar spine exam.  At this point, I believe her symptoms are coming from more of the hip flexor tendinitis unrelated from her fall.  Her prosthesis is stable on x-ray.  I recommended a course of physical therapy for which I have submitted an internal referral.  She will follow-up with [name] in July when she is 2 years out from surgery unless she needs to see [name] sooner.  Call with concerns or questions meantime. ? ?Follow-Up Instructions: Return in about 4 months (around 08/21/2021).  ? ?Orders:  ?Orders Placed This Encounter  ?Procedures  ? XR HIP UNILAT W OR W/O PELVIS 2-3 VIEWS LEFT  ? Ambulatory referral to Physical Therapy  ? ?Meds ordered this encounter  ?Medications  ? acetaminophen-codeine (TYLENOL #3)  300-30 MG tablet  ?  Sig: Take 1 tablet by mouth every 8 (eight) hours as needed for moderate pain.  ?  Dispense:  30 tablet  ?  Refill:  0  ? ? ?Imaging: ?XR HIP UNILAT W OR W/O PELVIS 2-3 VIEWS LEFT ? ?Result Date: 04/21/2021 ?Well-seated prosthesis without complication  ? ?PMFS History: ?Patient Active Problem List  ? Diagnosis Date Noted  ? Anemia of chronic renal failure 03/08/2021  ? Chronic renal disease, stage IV ([facility]) 03/08/2021  ? 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 AVN (avascular necrosis of bone) ([facility]) 07/08/2019  ? Diverticulosis of colon without hemorrhage 07/08/2019  ? 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 Tremor 03/02/2017  ? Shaking 08/23/2016  ? Excessive daytime sleepiness 08/23/2016  ? Obstructive sleep apnea 08/23/2016  ? ?Past Medical History:  ?Diagnosis Date  ? Anemia in chronic kidney disease (CKD)   ? Arthritis   ? Asthma   ? Blood dyscrasia   ? systemic lupus  ? Chronic kidney disease (CKD), active medical management without dialysis, stage 4 (severe) ([facility])   ? Dyspnea   ? GERD (gastroesophageal reflux disease)   ? Gout   ? Hypertension   ? Lupus ([facility])   ? Narcolepsy   ? Pneumonia   ? Sleep apnea   ?  ?  Family History  ?Problem Relation Age of Onset  ? Hypertension Mother   ? Diabetes Mother   ? Dementia Father   ? Hypertension Father   ? Hyperlipidemia Father   ? Diabetes Paternal Grandmother   ? Alcohol abuse Paternal Grandfather   ?  ?Past Surgical History:  ?Procedure Laterality Date  ? CESAREAN SECTION    ? TOTAL HIP ARTHROPLASTY Left 08/26/2019  ? Procedure: LEFT TOTAL HIP ARTHROPLASTY ANTERIOR APPROACH;  Surgeon: Leandrew Koyanagi, MD;  Location: [facility];  Service: Orthopedics;  Laterality: Left;  ? TUBAL LIGATION    ? ?Social History  ? ?Occupational History  ? 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 Smoking status: Never  ? Smokeless tobacco: Never  ?Vaping Use  ? Vaping Use: Never used  ?Substance and Sexual Activity  ? Alcohol use: No  ? Drug use: No   ? Sexual activity: Not on file  ? ? ? ?

## 2021-04-26 ENCOUNTER — Encounter (HOSPITAL_COMMUNITY): Payer: Medicare HMO

## 2021-04-27 DIAGNOSIS — E7800 Pure hypercholesterolemia, unspecified: Secondary | ICD-10-CM | POA: Diagnosis not present

## 2021-04-27 DIAGNOSIS — M3210 Systemic lupus erythematosus, organ or system involvement unspecified: Secondary | ICD-10-CM | POA: Diagnosis not present

## 2021-04-27 DIAGNOSIS — I10 Essential (primary) hypertension: Secondary | ICD-10-CM | POA: Diagnosis not present

## 2021-04-27 DIAGNOSIS — N185 Chronic kidney disease, stage 5: Secondary | ICD-10-CM | POA: Diagnosis not present

## 2021-04-27 DIAGNOSIS — K219 Gastro-esophageal reflux disease without esophagitis: Secondary | ICD-10-CM | POA: Diagnosis not present

## 2021-04-27 DIAGNOSIS — G47411 Narcolepsy with cataplexy: Secondary | ICD-10-CM | POA: Diagnosis not present

## 2021-04-27 DIAGNOSIS — J4520 Mild intermittent asthma, uncomplicated: Secondary | ICD-10-CM | POA: Diagnosis not present

## 2021-04-27 DIAGNOSIS — Z0000 Encounter for general adult medical examination without abnormal findings: Secondary | ICD-10-CM | POA: Diagnosis not present

## 2021-04-27 DIAGNOSIS — M810 Age-related osteoporosis without current pathological fracture: Secondary | ICD-10-CM | POA: Diagnosis not present

## 2021-04-28 ENCOUNTER — Other Ambulatory Visit: Payer: Self-pay | Admitting: Family Medicine

## 2021-04-28 DIAGNOSIS — Z1231 Encounter for screening mammogram for malignant neoplasm of breast: Secondary | ICD-10-CM

## 2021-04-29 DIAGNOSIS — S42291D Other displaced fracture of upper end of right humerus, subsequent encounter for fracture with routine healing: Secondary | ICD-10-CM | POA: Diagnosis not present

## 2021-04-29 DIAGNOSIS — M2560 Stiffness of unspecified joint, not elsewhere classified: Secondary | ICD-10-CM | POA: Diagnosis not present

## 2021-04-29 DIAGNOSIS — M25511 Pain in right shoulder: Secondary | ICD-10-CM | POA: Diagnosis not present

## 2021-04-29 DIAGNOSIS — M6281 Muscle weakness (generalized): Secondary | ICD-10-CM | POA: Diagnosis not present

## 2021-05-05 ENCOUNTER — Encounter (HOSPITAL_COMMUNITY)
Admission: RE | Admit: 2021-05-05 | Discharge: 2021-05-05 | Disposition: A | Discharge status: Home / Self Care | Payer: Medicare HMO | Source: Ambulatory Visit | Attending: Nephrology | Admitting: Nephrology

## 2021-05-05 VITALS — BP 139/89 | HR 74 | Temp 97.7°F | Resp 18

## 2021-05-05 DIAGNOSIS — N189 Chronic kidney disease, unspecified: Secondary | ICD-10-CM | POA: Insufficient documentation

## 2021-05-05 DIAGNOSIS — D631 Anemia in chronic kidney disease: Secondary | ICD-10-CM | POA: Diagnosis present

## 2021-05-05 LAB — IRON AND TIBC
Iron: 17 ug/dL — ABNORMAL LOW (ref 28–170)
Saturation Ratios: 4 % — ABNORMAL LOW (ref 10.4–31.8)
TIBC: 413 ug/dL (ref 250–450)
UIBC: 396 ug/dL

## 2021-05-05 LAB — POCT HEMOGLOBIN-HEMACUE: Hemoglobin: 8.4 g/dL — ABNORMAL LOW (ref 12.0–15.0)

## 2021-05-05 LAB — RENAL FUNCTION PANEL
Albumin: 3.2 g/dL — ABNORMAL LOW (ref 3.5–5.0)
Anion gap: 8 (ref 5–15)
BUN: 52 mg/dL — ABNORMAL HIGH (ref 8–23)
CO2: 20 mmol/L — ABNORMAL LOW (ref 22–32)
Calcium: 8.2 mg/dL — ABNORMAL LOW (ref 8.9–10.3)
Chloride: 110 mmol/L (ref 98–111)
Creatinine, Ser: 3.18 mg/dL — ABNORMAL HIGH (ref 0.44–1.00)
GFR, Estimated: 16 mL/min — ABNORMAL LOW (ref >60)
Glucose, Bld: 98 mg/dL (ref 70–99)
Phosphorus: 4 mg/dL (ref 2.5–4.6)
Potassium: 3.5 mmol/L (ref 3.5–5.1)
Sodium: 138 mmol/L (ref 135–145)

## 2021-05-05 MED ORDER — EPOETIN ALFA-EPBX 10000 UNIT/ML IJ SOLN
INTRAMUSCULAR | Status: AC
  Filled 2021-05-05: qty 2

## 2021-05-05 MED ORDER — EPOETIN ALFA-EPBX 10000 UNIT/ML IJ SOLN
20000.0000 [IU] | Freq: Once | INTRAMUSCULAR | Status: AC
  Administered 2021-05-05: 20000 [IU] via SUBCUTANEOUS

## 2021-05-06 ENCOUNTER — Ambulatory Visit
Admission: RE | Admit: 2021-05-06 | Discharge: 2021-05-06 | Disposition: A | Discharge status: Home / Self Care | Payer: Medicare HMO | Source: Ambulatory Visit | Attending: Family Medicine | Admitting: Family Medicine

## 2021-05-06 DIAGNOSIS — M6281 Muscle weakness (generalized): Secondary | ICD-10-CM | POA: Diagnosis not present

## 2021-05-06 DIAGNOSIS — M2560 Stiffness of unspecified joint, not elsewhere classified: Secondary | ICD-10-CM | POA: Diagnosis not present

## 2021-05-06 DIAGNOSIS — Z1231 Encounter for screening mammogram for malignant neoplasm of breast: Secondary | ICD-10-CM | POA: Diagnosis not present

## 2021-05-06 DIAGNOSIS — S42291D Other displaced fracture of upper end of right humerus, subsequent encounter for fracture with routine healing: Secondary | ICD-10-CM | POA: Diagnosis not present

## 2021-05-06 DIAGNOSIS — M25511 Pain in right shoulder: Secondary | ICD-10-CM | POA: Diagnosis not present

## 2021-05-12 ENCOUNTER — Encounter: Payer: Self-pay | Admitting: Physical Therapy

## 2021-05-12 ENCOUNTER — Ambulatory Visit (INDEPENDENT_AMBULATORY_CARE_PROVIDER_SITE_OTHER): Payer: Medicare HMO | Admitting: Physical Therapy

## 2021-05-12 DIAGNOSIS — M6281 Muscle weakness (generalized): Secondary | ICD-10-CM | POA: Diagnosis not present

## 2021-05-12 DIAGNOSIS — R262 Difficulty in walking, not elsewhere classified: Secondary | ICD-10-CM | POA: Diagnosis not present

## 2021-05-12 DIAGNOSIS — M25552 Pain in left hip: Secondary | ICD-10-CM | POA: Diagnosis not present

## 2021-05-12 IMAGING — MR MR HIP*L* W/O CM
5 series · 40 of 40 positions shown · non-contrast
Comparison: Radiographs 05/14/2019

CLINICAL DATA: Left hip pain peri gout

EXAM:
MR OF THE LEFT HIP WITHOUT CONTRAST
TECHNIQUE: Multiplanar, multisequence MR imaging was performed. No intravenous
contrast was administered.

[Series 3: T1 · coronal · 4.0mm · 1.19mm/px · 9 of 24 slices shown]
[im 1/24]
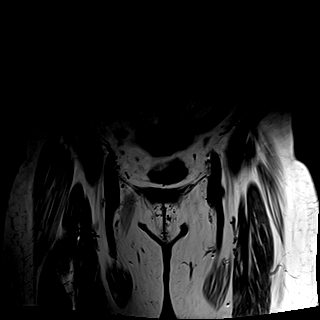
[im 3/24]
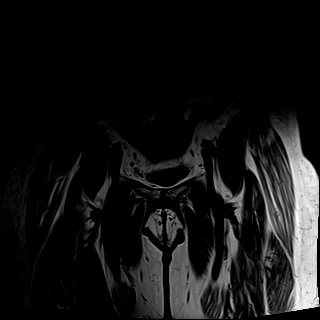
[im 6/24]
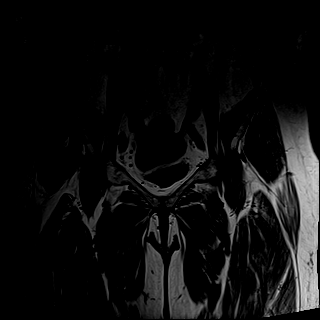
[im 9/24]
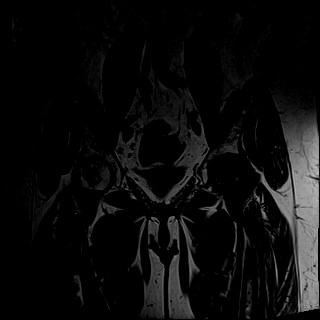
[im 12/24]
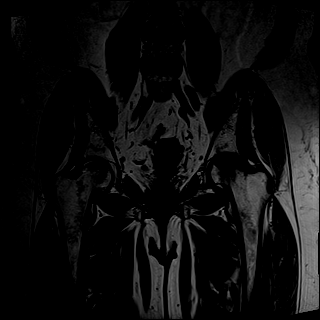
[im 15/24]
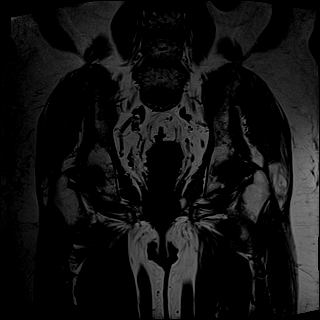
[im 18/24]
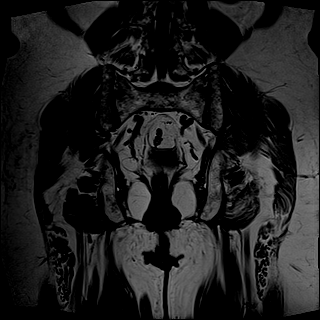
[im 21/24]
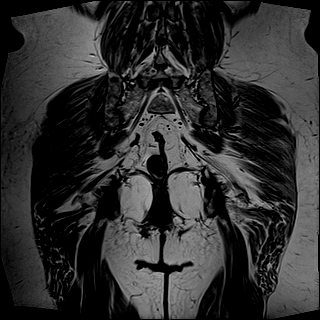
[im 24/24]
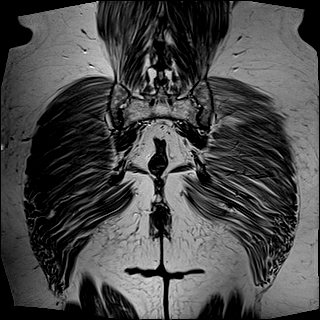

[Series 4: T2 fat-sat · coronal · 4.0mm · 1.19mm/px · 8 of 24 slices shown (1 of 2)]
[im 1/24]
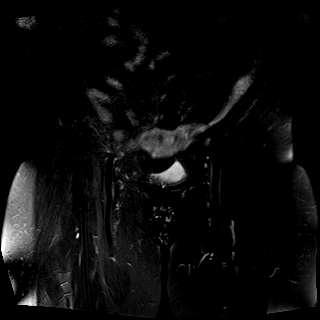
[im 4/24]
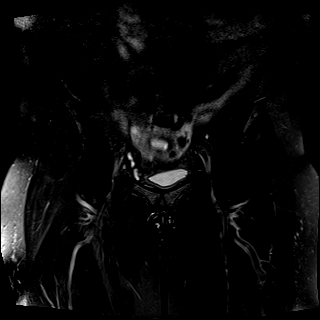
[im 7/24]
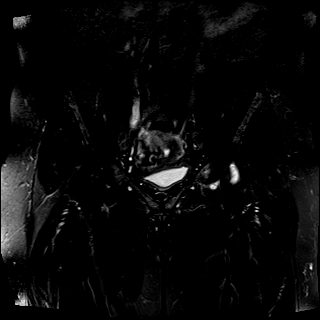
[im 10/24]
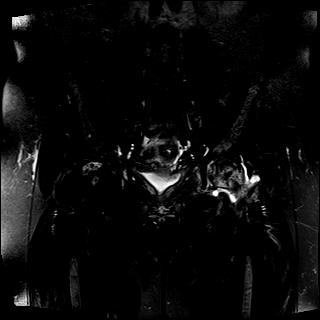
[im 14/24]
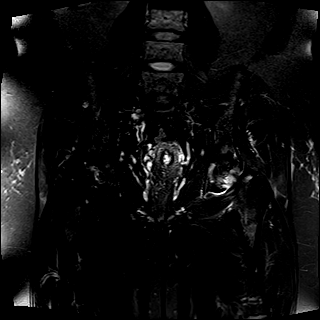
[im 17/24]
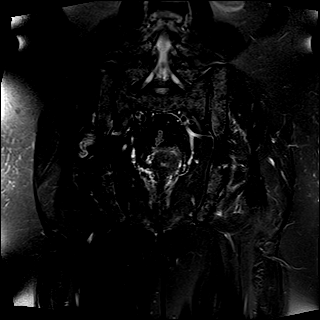
[im 20/24]
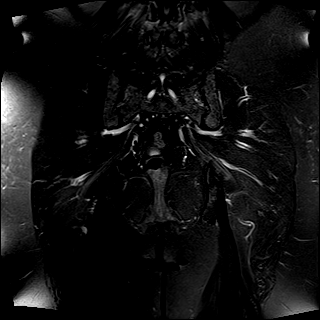
[im 24/24]
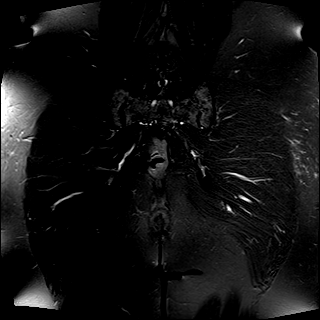

[Series 6: T2 fat-sat · axial · 4.0mm · 0.35mm/px · z∈[+8,+138]mm · 9 of 27 slices shown (2 of 2)]
[im 1/27]
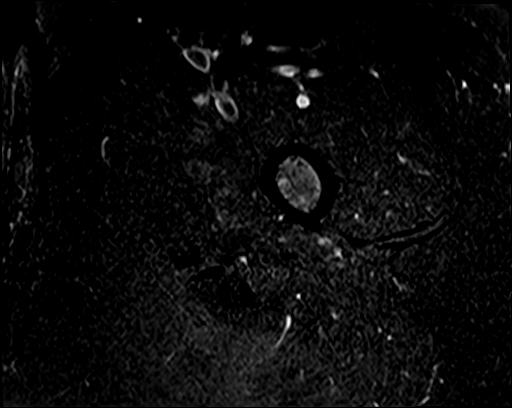
[im 4/27]
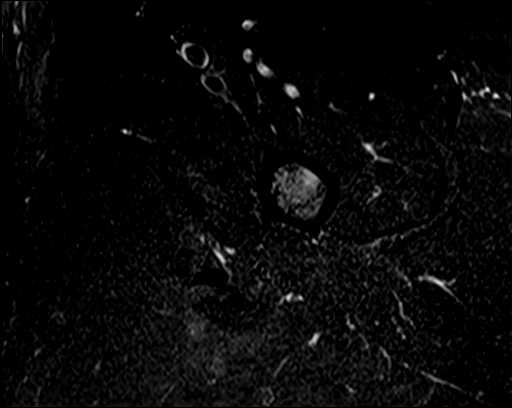
[im 7/27]
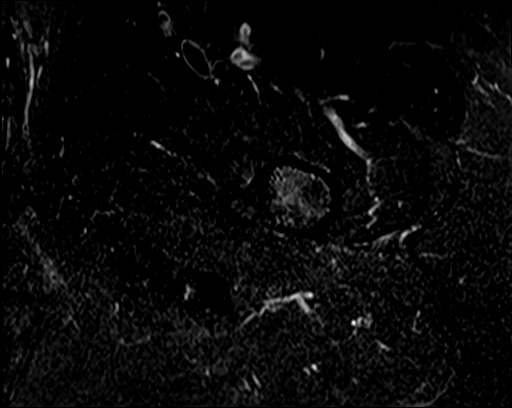
[im 10/27]
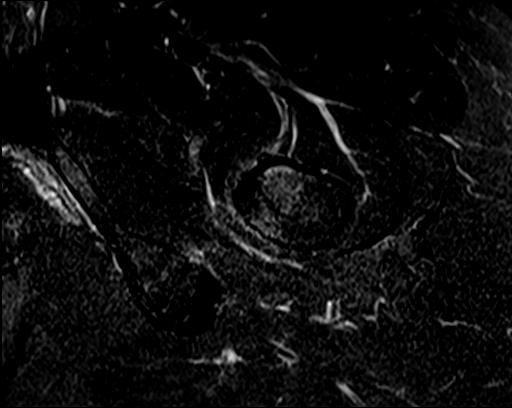
[im 14/27]
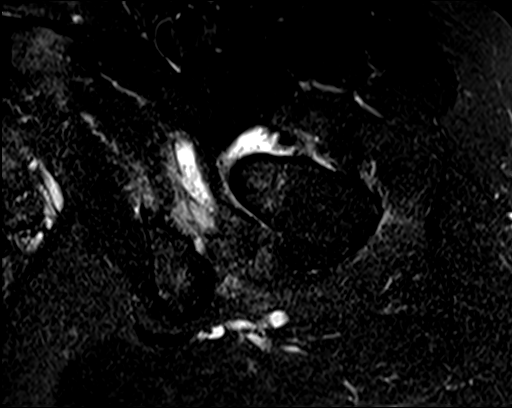
[im 17/27]
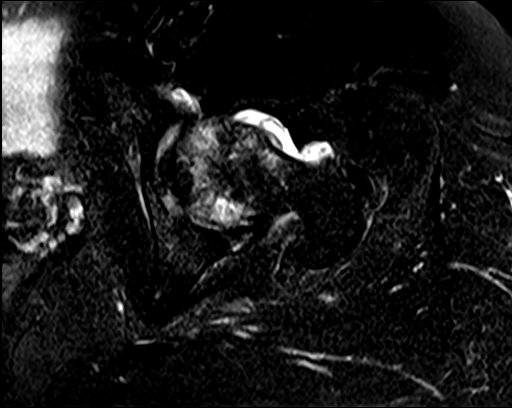
[im 20/27]
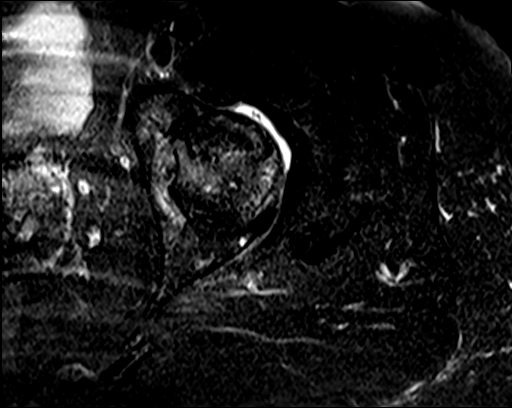
[im 23/27]
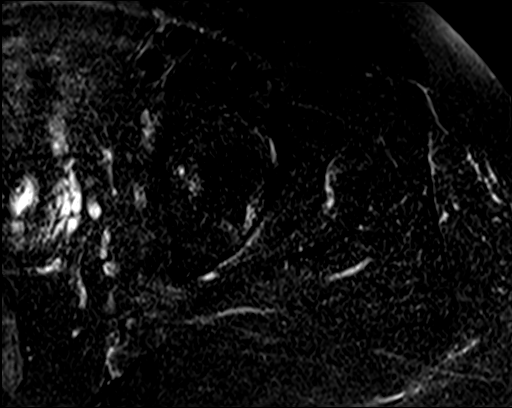
[im 27/27]
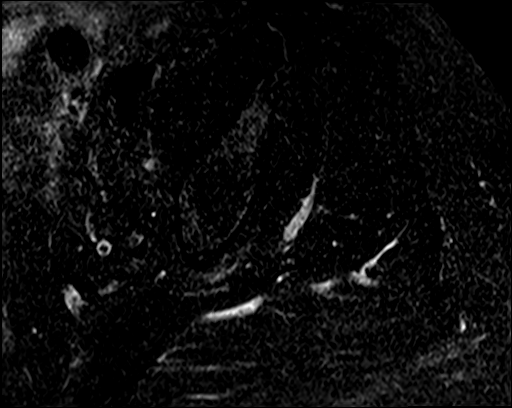

[Series 8: PD fat-sat · coronal · 4.0mm · 0.70mm/px · 6 of 19 slices shown (1 of 2)]
[im 1/19]
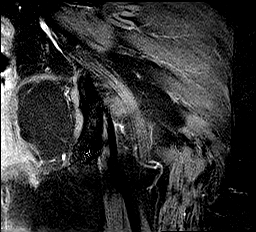
[im 4/19]
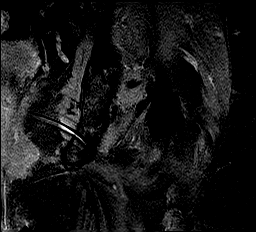
[im 8/19]
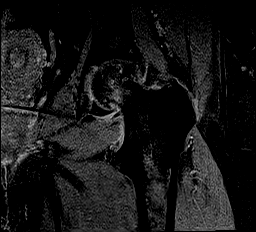
[im 11/19]
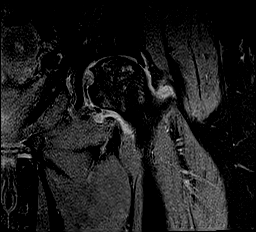
[im 15/19]
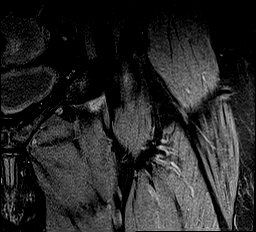
[im 19/19]
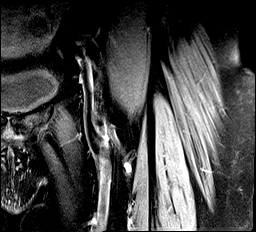

[Series 9: PD fat-sat · sagittal · 4.0mm · 0.70mm/px · 8 of 24 slices shown (2 of 2)]
[im 1/24]
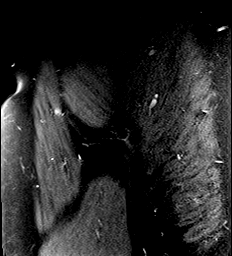
[im 4/24]
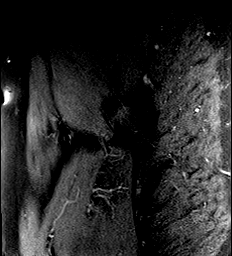
[im 7/24]
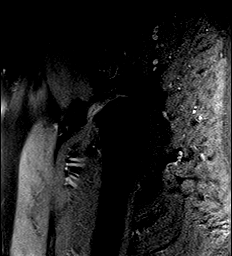
[im 10/24]
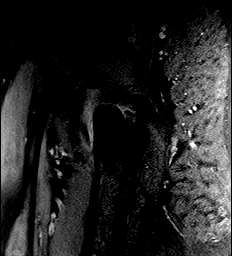
[im 14/24]
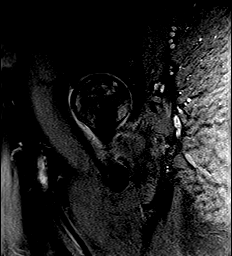
[im 17/24]
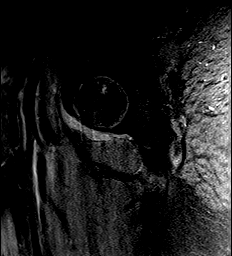
[im 20/24]
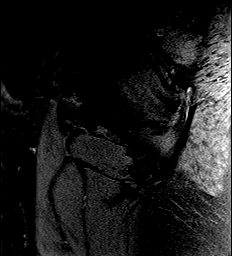
[im 24/24]
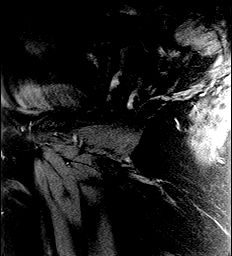

[40 of 40 positions shown; findings below may reference images not displayed]

FINDINGS: Bones: MRI confirms bilateral hip avascular necrosis. The region of
involvement is larger on the left than the right, encompassing a
substantial portion of the volume and the articular surface of the
left femoral head, with evidence of chronic AVN but also with
abnormal edema within the region of AVN and within the surrounding
femoral head extending down into the femoral neck compatible with
active process which may be due to acute superimposed necrosis or
early collapse. There is a suggestion of mild contour flattening of
the left femoral head along the AVN suggesting very early levels of
collapse or fracture.

On the right side, there is evidence of chronic avascular necrosis
of the femoral head with revascularization, and no active edema or
specific acute process involving the right hip.

Red marrow redistribution is present. This is nonspecific can be
associated with a variety of conditions including chronic anemia,
chronic heart failure, myelofibrosis, infiltrative marrow disease,
response to infection, and other conditions.

Articular cartilage and labrum

Articular cartilage: Mild thinning of articular cartilage in both
hips, likely degenerative.

Labrum:  No large tear or paralabral cyst seen.

Joint or bursal effusion

Joint effusion: Small to moderate left hip joint effusion is
present.

Bursae: No regional bursitis.

Muscles and tendons

Muscles and tendons:  Unremarkable

Other findings

Miscellaneous: Bilateral intramural uterine fibroids, largest about
3.1 cm in diameter in the right lower uterine body. Sigmoid colon
diverticulosis.
IMPRESSION: 1. Bilateral hip avascular necrosis, chronic on the right but with
chronic components and also with with abnormal edema within the left
femoral head extending down into the femoral neck compatible with
active process which may be due to early collapse versus acute
component of AVN.
2. Small to moderate left hip joint effusion.
3. Mild degenerative articular cartilage thinning in both hips.
4. Red marrow redistribution is present. This is nonspecific can be
associated with a variety of conditions including chronic anemia,
chronic heart failure, myelofibrosis, infiltrative marrow disease,
response to infection, and other conditions.
5. Bilateral intramural uterine fibroids.
6. Sigmoid colon diverticulosis.

## 2021-05-12 NOTE — Therapy (Signed)
?OUTPATIENT PHYSICAL THERAPY LOWER EXTREMITY EVALUATION ? ? ?Patient Name: Colleen Obrien ?MRN: [id number] ?DOB:07-19-1955, 66 y.o., female ?Today's Date: 05/12/2021 ? ? PT End of Session - 05/12/21 1301   ? ? Visit Number 1   ? Number of Visits 12   ? Date for PT Re-Evaluation 07/07/21   ? Authorization Type [employer] MCR   ? PT Start Time 1100   ? PT Stop Time 1145   ? PT Time Calculation (min) 45 min   ? Activity Tolerance Patient tolerated treatment well   ? Behavior During Therapy [facility] for tasks assessed/performed   ? ?  ?  ? ?  ? ? ?Past Medical History:  ?Diagnosis Date  ? Anemia in chronic kidney disease (CKD)   ? Arthritis   ? Asthma   ? Blood dyscrasia   ? systemic lupus  ? Chronic kidney disease (CKD), active medical management without dialysis, stage 4 (severe) ([place])   ? Dyspnea   ? GERD (gastroesophageal reflux disease)   ? Gout   ? Hypertension   ? Lupus ([place])   ? Narcolepsy   ? Pneumonia   ? Sleep apnea   ? ?Past Surgical History:  ?Procedure Laterality Date  ? CESAREAN SECTION    ? TOTAL HIP ARTHROPLASTY Left 08/26/2019  ? Procedure: LEFT TOTAL HIP ARTHROPLASTY ANTERIOR APPROACH;  Surgeon: Leandrew Koyanagi, MD;  Location: [place];  Service: Orthopedics;  Laterality: Left;  ? TUBAL LIGATION    ? ?Patient Active Problem List  ? Diagnosis Date Noted  ? Anemia of chronic renal failure 03/08/2021  ? Chronic renal disease, stage IV ([place]) 03/08/2021  ? 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 AVN (avascular necrosis of bone) ([place]) 07/08/2019  ? Diverticulosis of colon without hemorrhage 07/08/2019  ? 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 Tremor 03/02/2017  ? Shaking 08/23/2016  ? Excessive daytime sleepiness 08/23/2016  ? Obstructive sleep apnea 08/23/2016  ? ? ?PCP: Carol Ada, MD ? ?REFERRING PROVIDER: Aundra Dubin, PA-C ? ?REFERRING DIAG: J09.326 (ICD-10-CM) - Status post total replacement of left hip ? ?THERAPY DIAG:  ?Pain in left hip ? ?Muscle weakness  (generalized) ? ?Difficulty in walking, not elsewhere classified ? ?ONSET DATE: THA 2021 then fall in January 2023 ? ?SUBJECTIVE:  ? ?SUBJECTIVE STATEMENT: ?Pain in left hip and hip flexor pain, History of THA 2 years ago and was doing well but then she fell from her bed back in January landing on the right side. She said then the pain started 3 weeks later. She denies N/T ? ?PERTINENT HISTORY: CKD, OA,sleep apnea,narcolepsy,Lt THA 2021 ? ? ?PAIN:  ?Are you having pain? Yes: NPRS scale: 4/10 ?Pain location: left hip ?Pain description: ache and throb ?Aggravating factors: standing and prolonged walking, getting up from low chair  ?Relieving factors: tylenol ? ?PRECAUTIONS: Lt Anterior hip replacement 2 years ago ? ?WEIGHT BEARING RESTRICTIONS No ? ?FALLS:  ?Has patient fallen in last 6 months? Yes 1, does not have a fear of falling or does not feel off balance ? ?LIVING ENVIRONMENT: ? ?Lives in: House/apartment ?Stairs: has stairs, 4 to enter and has difficulty with this, has stairs in home but seldom goes up to 2nd floor ? ?OCCUPATION: retired ? ?PLOF: Independent ? ?PATIENT GOALS : reduce the pain, and walk more normal like she was before ? ? ?OBJECTIVE:  ? ?DIAGNOSTIC FINDINGS: Recent XR shows "Well-seated prosthesis without complication" ? ?PATIENT SURVEYS:  ?FOTO 61% functional ? ?COGNITION: ? Overall  cognitive status: Within functional limits for tasks assessed   ?  ?SENSATION: ?WFL ? ?MUSCLE LENGTH: ?Tight hip flexor and quads on Lt ? ?PALPATION: ?TTP proximal quad and hip flexor on Lt with tightness felt ? ?LE ROM: ? ?Active ROM Left ?05/12/2021  ?Hip flexion 80 deg in standing  ?Hip extension   ?Hip abduction WFL sitting  ?Hip adduction   ?Hip internal rotation 30 sitting  ?Hip external rotation 20 sitting  ?Knee flexion WFL  ?Knee extension WFL  ?Ankle dorsiflexion   ?Ankle plantarflexion   ?Ankle inversion   ?Ankle eversion   ? (Blank rows = not tested) ? ?LE MMT: ? ?MMT Left ?05/12/2021  ?Hip flexion 2   ?Hip extension   ?Hip abduction 3  ?Hip adduction 3  ?Hip internal rotation   ?Hip external rotation   ?Knee flexion 5  ?Knee extension 5  ?Ankle dorsiflexion   ?Ankle plantarflexion   ?Ankle inversion   ?Ankle eversion   ? (Blank rows = not tested) ? ?LOWER EXTREMITY SPECIAL TESTS:  ?Pain with resisted hip flexion consistent with hip flexor strain ? ?FUNCTIONAL TESTS:  ?5 times sit to stand: 25.4 seconds must use UE to push up ? ?GAIT: ?05/12/21 ?Limited community Ambulator with antalgic gait on left and decreased velocity ? ? ? ?TODAY'S TREATMENT: ?05/12/21 ?HEP review and performed 10 reps of Lt hip standing marches, abductions, 10 reps of seated SLR, and Nu step X 5 min L5 UE/LE ? ? ?PATIENT EDUCATION:  ?Education details: HEP,POC,exam findings ?Person educated: Patient ?Education method: Explanation, Demonstration, Verbal cues, and Handouts ?Education comprehension: verbalized understanding and needs further education ? ? ?HOME EXERCISE PROGRAM: ?Access Code: [email or web address] ?URL: [email or web address] ?Date: 05/12/2021 ?Prepared by: Elsie Ra ? ?Exercises ?- Standing Hip Flexor Stretch  - 2 x daily - 6 x weekly - 1 sets - 3 reps - 20 sec hold ?- Standing Marching  - 2 x daily - 6 x weekly - 1-2 sets - 10 reps ?- Standing Hip Abduction with Counter Support  - 2 x daily - 6 x weekly - 1-2 sets - 10 reps ?- Seated Straight Leg Raise with Quad Contraction  - 2 x daily - 6 x weekly - 1 sets - 10 reps ? ?ASSESSMENT: ? ?CLINICAL IMPRESSION: Patient presents with signs and symptoms consistent with Lt hip flexor strain and general Lt hip weakness. She does have history of Lt THA 2021. Patient will benefit from skilled PT to address below impairments, limitations and improve overall function. ? ?OBJECTIVE IMPAIRMENTS: decreased activity tolerance, difficulty walking, decreased balance, decreased endurance, decreased mobility, decreased ROM, decreased strength, impaired flexibility, impaired LE use, postural  dysfunction, and pain. ? ?ACTIVITY LIMITATIONS: bending, lifting, carry, locomotion, cleaning, community activity, driving ? ?PERSONAL FACTORS: CKD, OA,sleep apnea,narcolepsy,Lt THA 2021 are also affecting patient's functional outcome. ? ?REHAB POTENTIAL: Good ? ?CLINICAL DECISION MAKING: Stable/uncomplicated ? ?EVALUATION COMPLEXITY: Low ? ? ? ?GOALS: ?Short term PT Goals Target date: 06/09/2021 ?Pt will be I and compliant with HEP. ?Baseline:  ?Goal status: New ?Pt will decrease pain by 25% overall ?Baseline: ?Goal status: New ? ?Long term PT goals Target date: 07/07/2021 ?Pt will improve left hip flexion ROM to [facility] to improve functional mobility ?Baseline: ?Goal status: New ?Pt will improve  hip strength to at least 4+/5 MMT to improve functional strength ?Baseline: ?Goal status: New ?Pt will improve FOTO to at least 74% functional to show improved function ?Baseline: ?Goal status: New ?Pt will reduce pain by overall 50% overall with  usual activity, getting up from chairs, and ambulation ?Baseline: ?Goal status: New ?5. Pt will improve 5 times sit to stand to less than 18 seconds to show improved leg strength and endurance ? ?PLAN: ?PT FREQUENCY: 1-2 times per week  ? ?PT DURATION: 6-8 weeks ? ?PLANNED INTERVENTIONS (unless contraindicated): aquatic PT, Canalith repositioning, cryotherapy, Electrical stimulation, Iontophoresis with 4 mg/ml dexamethasome, Moist heat, traction, Ultrasound, gait training, Therapeutic exercise, balance training, neuromuscular re-education, patient/family education, prosthetic training, manual techniques, passive ROM, dry needling, taping, vasopnuematic device, vestibular, spinal manipulations, joint manipulations ? ?PLAN FOR NEXT SESSION: review HEP, nu step, left hip strength as able. ? ? ? ?Debbe Odea, PT,DPT ?05/12/2021, 1:03 PM ? ?

## 2021-05-20 DIAGNOSIS — M25511 Pain in right shoulder: Secondary | ICD-10-CM | POA: Diagnosis not present

## 2021-05-20 DIAGNOSIS — M2560 Stiffness of unspecified joint, not elsewhere classified: Secondary | ICD-10-CM | POA: Diagnosis not present

## 2021-05-20 DIAGNOSIS — S42291D Other displaced fracture of upper end of right humerus, subsequent encounter for fracture with routine healing: Secondary | ICD-10-CM | POA: Diagnosis not present

## 2021-05-20 DIAGNOSIS — M6281 Muscle weakness (generalized): Secondary | ICD-10-CM | POA: Diagnosis not present

## 2021-05-21 DIAGNOSIS — Z1159 Encounter for screening for other viral diseases: Secondary | ICD-10-CM | POA: Diagnosis not present

## 2021-05-21 DIAGNOSIS — Z114 Encounter for screening for human immunodeficiency virus [HIV]: Secondary | ICD-10-CM | POA: Diagnosis not present

## 2021-05-21 DIAGNOSIS — Z79899 Other long term (current) drug therapy: Secondary | ICD-10-CM | POA: Diagnosis not present

## 2021-05-21 DIAGNOSIS — M3214 Glomerular disease in systemic lupus erythematosus: Secondary | ICD-10-CM | POA: Diagnosis not present

## 2021-05-21 DIAGNOSIS — M329 Systemic lupus erythematosus, unspecified: Secondary | ICD-10-CM | POA: Diagnosis not present

## 2021-05-21 DIAGNOSIS — Z7682 Awaiting organ transplant status: Secondary | ICD-10-CM | POA: Diagnosis not present

## 2021-05-21 DIAGNOSIS — N184 Chronic kidney disease, stage 4 (severe): Secondary | ICD-10-CM | POA: Diagnosis not present

## 2021-05-21 DIAGNOSIS — G4733 Obstructive sleep apnea (adult) (pediatric): Secondary | ICD-10-CM | POA: Diagnosis not present

## 2021-05-21 DIAGNOSIS — G47429 Narcolepsy in conditions classified elsewhere without cataplexy: Secondary | ICD-10-CM | POA: Diagnosis not present

## 2021-05-21 DIAGNOSIS — I071 Rheumatic tricuspid insufficiency: Secondary | ICD-10-CM | POA: Diagnosis not present

## 2021-05-21 DIAGNOSIS — I10 Essential (primary) hypertension: Secondary | ICD-10-CM | POA: Diagnosis not present

## 2021-05-21 DIAGNOSIS — Z87440 Personal history of urinary (tract) infections: Secondary | ICD-10-CM | POA: Diagnosis not present

## 2021-05-21 DIAGNOSIS — G47411 Narcolepsy with cataplexy: Secondary | ICD-10-CM | POA: Diagnosis not present

## 2021-05-21 DIAGNOSIS — J45909 Unspecified asthma, uncomplicated: Secondary | ICD-10-CM | POA: Diagnosis not present

## 2021-05-21 DIAGNOSIS — I361 Nonrheumatic tricuspid (valve) insufficiency: Secondary | ICD-10-CM | POA: Diagnosis not present

## 2021-05-21 DIAGNOSIS — N185 Chronic kidney disease, stage 5: Secondary | ICD-10-CM | POA: Diagnosis not present

## 2021-05-21 DIAGNOSIS — Z01810 Encounter for preprocedural cardiovascular examination: Secondary | ICD-10-CM | POA: Diagnosis not present

## 2021-05-21 DIAGNOSIS — Z9181 History of falling: Secondary | ICD-10-CM | POA: Diagnosis not present

## 2021-05-27 ENCOUNTER — Ambulatory Visit (INDEPENDENT_AMBULATORY_CARE_PROVIDER_SITE_OTHER): Payer: Medicare HMO | Admitting: Physical Therapy

## 2021-05-27 ENCOUNTER — Encounter: Payer: Self-pay | Admitting: Physical Therapy

## 2021-05-27 ENCOUNTER — Other Ambulatory Visit: Payer: Self-pay

## 2021-05-27 DIAGNOSIS — M25552 Pain in left hip: Secondary | ICD-10-CM

## 2021-05-27 DIAGNOSIS — M6281 Muscle weakness (generalized): Secondary | ICD-10-CM | POA: Diagnosis not present

## 2021-05-27 DIAGNOSIS — R262 Difficulty in walking, not elsewhere classified: Secondary | ICD-10-CM

## 2021-05-27 NOTE — Therapy (Signed)
?OUTPATIENT PHYSICAL THERAPY TREATMENT NOTE ? ? ?Patient Name: Colleen Obrien ?MRN: [id number] ?DOB:1955-10-14, 66 y.o., female ?Today's Date: 05/27/2021 ? ? ?END OF SESSION:  ? PT End of Session - 05/27/21 1116   ? ? Visit Number 2   ? Number of Visits 12   ? Date for PT Re-Evaluation 07/07/21   ? Authorization Type Aetna MCR   ? PT Start Time 1110   arrives late  ? PT Stop Time 1145   ? PT Time Calculation (min) 35 min   ? Activity Tolerance Patient tolerated treatment well   ? Behavior During Therapy [facility] for tasks assessed/performed   ? ?  ?  ? ?  ? ? ?Past Medical History:  ?Diagnosis Date  ? Anemia in chronic kidney disease (CKD)   ? Arthritis   ? Asthma   ? Blood dyscrasia   ? systemic lupus  ? Chronic kidney disease (CKD), active medical management without dialysis, stage 4 (severe) ([place])   ? Dyspnea   ? GERD (gastroesophageal reflux disease)   ? Gout   ? Hypertension   ? Lupus ([place])   ? Narcolepsy   ? Pneumonia   ? Sleep apnea   ? ?Past Surgical History:  ?Procedure Laterality Date  ? CESAREAN SECTION    ? TOTAL HIP ARTHROPLASTY Left 08/26/2019  ? Procedure: LEFT TOTAL HIP ARTHROPLASTY ANTERIOR APPROACH;  Surgeon: Leandrew Koyanagi, MD;  Location: [place];  Service: Orthopedics;  Laterality: Left;  ? TUBAL LIGATION    ? ?Patient Active Problem List  ? Diagnosis Date Noted  ? Anemia of chronic renal failure 03/08/2021  ? Chronic renal disease, stage IV ([place]) 03/08/2021  ? 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 AVN (avascular necrosis of bone) ([place]) 07/08/2019  ? Diverticulosis of colon without hemorrhage 07/08/2019  ? 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 Tremor 03/02/2017  ? Shaking 08/23/2016  ? Excessive daytime sleepiness 08/23/2016  ? Obstructive sleep apnea 08/23/2016  ? ? ?THERAPY DIAG:  ?Pain in left hip ? ?Muscle weakness (generalized) ? ?Difficulty in walking, not elsewhere classified ? ?PCP: Carol Ada, MD ?  ?REFERRING PROVIDER: Aundra Dubin, PA-C ?  ?REFERRING  DIAG: Y09.983 (ICD-10-CM) - Status post total replacement of left hip ?  ?THERAPY DIAG:  ?Pain in left hip ?  ?Muscle weakness (generalized) ?  ?Difficulty in walking, not elsewhere classified ?  ?ONSET DATE: THA 2021 then fall in January 2023 ?  ?SUBJECTIVE:  ?  ?SUBJECTIVE STATEMENT: ?She says her hip is overall doing better ?  ?PERTINENT HISTORY: CKD, OA,sleep apnea,narcolepsy,Lt THA 2021 ?  ?  ?PAIN:  ?Are you having pain? Yes: NPRS scale: 1/10 upon arrival ?Pain location: left hip ?Pain description: ache and throb ?Aggravating factors: standing and prolonged walking, getting up from low chair  ?Relieving factors: tylenol ?  ?PRECAUTIONS: Lt Anterior hip replacement 2 years ago ?  ?WEIGHT BEARING RESTRICTIONS No ?  ?FALLS:  ?Has patient fallen in last 6 months? Yes 1, does not have a fear of falling or does not feel off balance ?  ?LIVING ENVIRONMENT: ?  ?Lives in: House/apartment ?Stairs: has stairs, 4 to enter and has difficulty with this, has stairs in home but seldom goes up to 2nd floor ?  ?OCCUPATION: retired ?  ?PLOF: Independent ?  ?PATIENT GOALS : reduce the pain, and walk more normal like she was before ?  ?  ?OBJECTIVE:  ?  ?DIAGNOSTIC FINDINGS: Recent XR shows "Well-seated prosthesis  without complication" ?  ?PATIENT SURVEYS:  ?FOTO 61% functional ?  ?COGNITION: ?          Overall cognitive status: Within functional limits for tasks assessed               ?           ?SENSATION: ?WFL ?  ?MUSCLE LENGTH: ?Tight hip flexor and quads on Lt ?  ?PALPATION: ?TTP proximal quad and hip flexor on Lt with tightness felt ?  ?LE ROM: ?  ?Active ROM Left ?05/12/2021  ?Hip flexion 80 deg in standing  ?Hip extension    ?Hip abduction WFL sitting  ?Hip adduction    ?Hip internal rotation 30 sitting  ?Hip external rotation 20 sitting  ?Knee flexion WFL  ?Knee extension WFL  ?Ankle dorsiflexion    ?Ankle plantarflexion    ?Ankle inversion    ?Ankle eversion    ? (Blank rows = not tested) ?  ?LE MMT: ?  ?MMT  Left ?05/12/2021  ?Hip flexion 2  ?Hip extension    ?Hip abduction 3  ?Hip adduction 3  ?Hip internal rotation    ?Hip external rotation    ?Knee flexion 5  ?Knee extension 5  ?Ankle dorsiflexion    ?Ankle plantarflexion    ?Ankle inversion    ?Ankle eversion    ? (Blank rows = not tested) ?  ?LOWER EXTREMITY SPECIAL TESTS:  ?Pain with resisted hip flexion consistent with hip flexor strain ?  ?FUNCTIONAL TESTS:  ?5 times sit to stand: 25.4 seconds must use UE to push up ?  ?GAIT: ?05/12/21 ?Limited community Ambulator with antalgic gait on left and decreased velocity ?  ?  ?  ?TODAY'S TREATMENT: ?05/27/21 ?SciFIT LE bike L3 X 6 min ?Leg press DL 37# 3X10 ?Step ups 4 inch step X 10 fwd and lateral on left with one UE support ?Standing hip abductions, extensions, and marches X 10 bilat with UE support ?Attempted Seated SLR but too difficult and painful so modified to LAQ 2# 2X10 bilat ?Supine bridges X10 ?Supine clams green 2X10 ?Supine alternate marches 2X10 bilat ?Standing left hip flexor stretch 30 sec X3 with one UE support ? ? ?05/12/21 ?HEP review and performed 10 reps of Lt hip standing marches, abductions, 10 reps of seated SLR, and Nu step X 5 min L5 UE/LE ?  ?  ?PATIENT EDUCATION:  ?Education details: HEP,POC,exam findings ?Person educated: Patient ?Education method: Explanation, Demonstration, Verbal cues, and Handouts ?Education comprehension: verbalized understanding and needs further education ?  ?  ?HOME EXERCISE PROGRAM: ?Access Code: [email or web address] ?URL: [email or web address] ?Date: 05/12/2021 ?Prepared by: Elsie Ra ?  ?Exercises ?- Standing Hip Flexor Stretch  - 2 x daily - 6 x weekly - 1 sets - 3 reps - 20 sec hold ?- Standing Marching  - 2 x daily - 6 x weekly - 1-2 sets - 10 reps ?- Standing Hip Abduction with Counter Support  - 2 x daily - 6 x weekly - 1-2 sets - 10 reps ?- Seated Straight Leg Raise with Quad Contraction  - 2 x daily - 6 x weekly - 1 sets - 10 reps ?  ?ASSESSMENT: ?   ?CLINICAL IMPRESSION: She is showing some good early progress with PT and had less overall pain and improved ability to flex her left hip today with less pain. We reviewed HEP and she did have some difficulty and pain with seated SLR so this was modified to LAQ for now. ? ?05/12/21: Patient  presents with signs and symptoms consistent with Lt hip flexor strain and general Lt hip weakness. She does have history of Lt THA 2021. Patient will benefit from skilled PT to address below impairments, limitations and improve overall function. ?  ?OBJECTIVE IMPAIRMENTS: decreased activity tolerance, difficulty walking, decreased balance, decreased endurance, decreased mobility, decreased ROM, decreased strength, impaired flexibility, impaired LE use, postural dysfunction, and pain. ?  ?ACTIVITY LIMITATIONS: bending, lifting, carry, locomotion, cleaning, community activity, driving ?  ?PERSONAL FACTORS: CKD, OA,sleep apnea,narcolepsy,Lt THA 2021 are also affecting patient's functional outcome. ?  ?REHAB POTENTIAL: Good ?  ?CLINICAL DECISION MAKING: Stable/uncomplicated ?  ?EVALUATION COMPLEXITY: Low ?  ?  ?  ?GOALS: ?Short term PT Goals Target date: 06/09/2021 ?Pt will be I and compliant with HEP. ?Baseline:  ?Goal status: New ?Pt will decrease pain by 25% overall ?Baseline: ?Goal status: New ?  ?Long term PT goals Target date: 07/07/2021 ?Pt will improve left hip flexion ROM to [facility] to improve functional mobility ?Baseline: ?Goal status: New ?Pt will improve  hip strength to at least 4+/5 MMT to improve functional strength ?Baseline: ?Goal status: New ?Pt will improve FOTO to at least 74% functional to show improved function ?Baseline: ?Goal status: New ?Pt will reduce pain by overall 50% overall with usual activity, getting up from chairs, and ambulation ?Baseline: ?Goal status: New ?5. Pt will improve 5 times sit to stand to less than 18 seconds to show improved leg strength and endurance ?  ?PLAN: ?PT FREQUENCY: 1-2 times per  week  ?  ?PT DURATION: 6-8 weeks ?  ?PLANNED INTERVENTIONS (unless contraindicated): aquatic PT, Canalith repositioning, cryotherapy, Electrical stimulation, Iontophoresis with 4 mg/ml dexamethasome, Moist heat, tracti

## 2021-06-02 ENCOUNTER — Encounter (HOSPITAL_COMMUNITY)
Admission: RE | Admit: 2021-06-02 | Discharge: 2021-06-02 | Disposition: A | Discharge status: Home / Self Care | Payer: Medicare HMO | Source: Ambulatory Visit | Attending: Nephrology | Admitting: Nephrology

## 2021-06-02 VITALS — BP 156/106 | HR 72 | Temp 97.2°F | Resp 18

## 2021-06-02 DIAGNOSIS — D631 Anemia in chronic kidney disease: Secondary | ICD-10-CM | POA: Insufficient documentation

## 2021-06-02 DIAGNOSIS — G4733 Obstructive sleep apnea (adult) (pediatric): Secondary | ICD-10-CM | POA: Diagnosis not present

## 2021-06-02 DIAGNOSIS — N189 Chronic kidney disease, unspecified: Secondary | ICD-10-CM | POA: Diagnosis not present

## 2021-06-02 DIAGNOSIS — G47411 Narcolepsy with cataplexy: Secondary | ICD-10-CM | POA: Diagnosis not present

## 2021-06-02 LAB — RENAL FUNCTION PANEL
Albumin: 3.2 g/dL — ABNORMAL LOW (ref 3.5–5.0)
Anion gap: 11 (ref 5–15)
BUN: 58 mg/dL — ABNORMAL HIGH (ref 8–23)
CO2: 19 mmol/L — ABNORMAL LOW (ref 22–32)
Calcium: 9.3 mg/dL (ref 8.9–10.3)
Chloride: 110 mmol/L (ref 98–111)
Creatinine, Ser: 3.61 mg/dL — ABNORMAL HIGH (ref 0.44–1.00)
GFR, Estimated: 13 mL/min — ABNORMAL LOW (ref >60)
Glucose, Bld: 90 mg/dL (ref 70–99)
Phosphorus: 4.9 mg/dL — ABNORMAL HIGH (ref 2.5–4.6)
Potassium: 5 mmol/L (ref 3.5–5.1)
Sodium: 140 mmol/L (ref 135–145)

## 2021-06-02 LAB — FERRITIN: Ferritin: 4 ng/mL — ABNORMAL LOW (ref 11–307)

## 2021-06-02 LAB — POCT HEMOGLOBIN-HEMACUE: Hemoglobin: 8.9 g/dL — ABNORMAL LOW (ref 12.0–15.0)

## 2021-06-02 LAB — IRON AND TIBC
Iron: 18 ug/dL — ABNORMAL LOW (ref 28–170)
Saturation Ratios: 4 % — ABNORMAL LOW (ref 10.4–31.8)
TIBC: 417 ug/dL (ref 250–450)
UIBC: 399 ug/dL

## 2021-06-02 MED ORDER — EPOETIN ALFA-EPBX 10000 UNIT/ML IJ SOLN
INTRAMUSCULAR | Status: AC
Start: 2021-06-02 — End: 2021-06-02
  Administered 2021-06-02: 20000 [IU] via SUBCUTANEOUS
  Filled 2021-06-02: qty 2

## 2021-06-02 MED ORDER — EPOETIN ALFA-EPBX 10000 UNIT/ML IJ SOLN: 20000.0000 [IU] | Freq: Once | INTRAMUSCULAR | Status: AC

## 2021-06-03 ENCOUNTER — Encounter: Payer: Self-pay | Admitting: Rehabilitative and Restorative Service Providers"

## 2021-06-03 ENCOUNTER — Ambulatory Visit (INDEPENDENT_AMBULATORY_CARE_PROVIDER_SITE_OTHER): Payer: Medicare HMO | Admitting: Rehabilitative and Restorative Service Providers"

## 2021-06-03 DIAGNOSIS — M25552 Pain in left hip: Secondary | ICD-10-CM

## 2021-06-03 DIAGNOSIS — M6281 Muscle weakness (generalized): Secondary | ICD-10-CM

## 2021-06-03 DIAGNOSIS — R262 Difficulty in walking, not elsewhere classified: Secondary | ICD-10-CM

## 2021-06-03 LAB — PTH, INTACT AND CALCIUM
Calcium, Total (PTH): 9.4 mg/dL (ref 8.7–10.3)
PTH: 1113 pg/mL — ABNORMAL HIGH (ref 15–65)

## 2021-06-03 NOTE — Therapy (Signed)
?OUTPATIENT PHYSICAL THERAPY TREATMENT NOTE ? ? ?Patient Name: Colleen Obrien ?MRN: [id number] ?DOB:1956-01-13, 66 y.o., female ?Today's Date: 06/03/2021 ? ? ?END OF SESSION:  ? PT End of Session - 06/03/21 1517   ? ? Visit Number 3   ? Number of Visits 12   ? Date for PT Re-Evaluation 07/07/21   ? Authorization Type Aetna MCR   ? PT Start Time 1152   ? PT Stop Time 1230   ? PT Time Calculation (min) 38 min   ? Activity Tolerance Patient tolerated treatment well   ? Behavior During Therapy [facility] for tasks assessed/performed   ? ?  ?  ? ?  ? ? ? ?Past Medical History:  ?Diagnosis Date  ? Anemia in chronic kidney disease (CKD)   ? Arthritis   ? Asthma   ? Blood dyscrasia   ? systemic lupus  ? Chronic kidney disease (CKD), active medical management without dialysis, stage 4 (severe) ([place])   ? Dyspnea   ? GERD (gastroesophageal reflux disease)   ? Gout   ? Hypertension   ? Lupus ([place])   ? Narcolepsy   ? Pneumonia   ? Sleep apnea   ? ?Past Surgical History:  ?Procedure Laterality Date  ? CESAREAN SECTION    ? TOTAL HIP ARTHROPLASTY Left 08/26/2019  ? Procedure: LEFT TOTAL HIP ARTHROPLASTY ANTERIOR APPROACH;  Surgeon: Leandrew Koyanagi, MD;  Location: [place];  Service: Orthopedics;  Laterality: Left;  ? TUBAL LIGATION    ? ?Patient Active Problem List  ? Diagnosis Date Noted  ? Anemia of chronic renal failure 03/08/2021  ? Chronic renal disease, stage IV ([place]) 03/08/2021  ? 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 AVN (avascular necrosis of bone) ([place]) 07/08/2019  ? Diverticulosis of colon without hemorrhage 07/08/2019  ? 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 Tremor 03/02/2017  ? Shaking 08/23/2016  ? Excessive daytime sleepiness 08/23/2016  ? Obstructive sleep apnea 08/23/2016  ? ? ?THERAPY DIAG:  ?Difficulty in walking, not elsewhere classified ? ?Muscle weakness (generalized) ? ?Pain in left hip ? ?PCP: Carol Ada, MD ?  ?REFERRING PROVIDER: Aundra Dubin, PA-C ?  ?REFERRING DIAG:  W09.811 (ICD-10-CM) - Status post total replacement of left hip ?  ?THERAPY DIAG:  ?Pain in left hip ?  ?Muscle weakness (generalized) ?  ?Difficulty in walking, not elsewhere classified ?  ?ONSET DATE: THA 2021 then fall in January 2023 ?  ?SUBJECTIVE:  ?  ?SUBJECTIVE STATEMENT: ?Colleen Obrien notes overall progress since starting PT.  L groin pain is most functionally limiting. ?  ?PERTINENT HISTORY: CKD, OA,sleep apnea,narcolepsy,Lt THA 2021 ?  ?  ?PAIN:  ?Are you having pain? Yes: NPRS scale: 1-3/10 this weekl ?Pain location: left hip ?Pain description: ache and sore ?Aggravating factors: standing and prolonged walking, getting up from low chair  ?Relieving factors: tylenol ?  ?PRECAUTIONS: Lt Anterior hip replacement 2 years ago ?  ?WEIGHT BEARING RESTRICTIONS No ?  ?FALLS:  ?Has patient fallen in last 6 months? Yes 1, does not have a fear of falling or does not feel off balance ?  ?LIVING ENVIRONMENT: ?  ?Lives in: House/apartment ?Stairs: has stairs, 4 to enter and has difficulty with this, has stairs in home but seldom goes up to 2nd floor ?  ?OCCUPATION: retired ?  ?PLOF: Independent ?  ?PATIENT GOALS : reduce the pain, and walk more normal like she was before ?  ?  ?OBJECTIVE:  ?  ?DIAGNOSTIC FINDINGS:  Recent XR shows "Well-seated prosthesis without complication" ?  ?PATIENT SURVEYS:  ?FOTO 61% functional ?  ?COGNITION: ?          Overall cognitive status: Within functional limits for tasks assessed               ?           ?SENSATION: ?WFL ?  ?MUSCLE LENGTH: ?Tight hip flexor and quads on Lt ?  ?PALPATION: ?TTP proximal quad and hip flexor on Lt with tightness felt ?  ?LE ROM: ?  ?Active ROM Left ?05/12/2021  ?Hip flexion 80 deg in standing  ?Hip extension    ?Hip abduction WFL sitting  ?Hip adduction    ?Hip internal rotation 30 sitting  ?Hip external rotation 20 sitting  ?Knee flexion WFL  ?Knee extension WFL  ?Ankle dorsiflexion    ?Ankle plantarflexion    ?Ankle inversion    ?Ankle eversion    ? (Blank rows  = not tested) ?  ?LE MMT: ?  ?MMT Left ?05/12/2021  ?Hip flexion 2  ?Hip extension    ?Hip abduction 3  ?Hip adduction 3  ?Hip internal rotation    ?Hip external rotation    ?Knee flexion 5  ?Knee extension 5  ?Ankle dorsiflexion    ?Ankle plantarflexion    ?Ankle inversion    ?Ankle eversion    ? (Blank rows = not tested) ?  ?LOWER EXTREMITY SPECIAL TESTS:  ?Pain with resisted hip flexion consistent with hip flexor strain ?  ?FUNCTIONAL TESTS:  ?5 times sit to stand: 25.4 seconds must use UE to push up ?  ?GAIT: ?05/12/21 ?Limited community Ambulator with antalgic gait on left and decreased velocity ?  ?  ?  ?TODAY'S TREATMENT: ?06/03/2021 ?Therapeutic Exercises: ?Recumbent bike Seat 4 for 5 minutes ?Alternating hip hike 5X 3 seconds (looked good) ?Hip flexors isometrics seated and standing 10X 5 seconds each ?Supine hip flexors stretch with 1 leg/hip hanging off treatment table 4X 20 seconds (L only) ?Bridging 15X 5 seconds ?Side lie clams (to strengthen L hip abductors) 15X slow eccentrics ?Supine clams with Green T-band 10X ? ?Therapeutic Activities: ?Step-down 4 inch step (L side does the work) ?Step-up and over 4, 6 and 8  inch step handrail PRN (up L, down slowly on the R) ? ? ?05/27/21 ?SciFIT LE bike L3 X 6 min ?Leg press DL 37# 3X10 ?Step ups 4 inch step X 10 fwd and lateral on left with one UE support ?Standing hip abductions, extensions, and marches X 10 bilat with UE support ?Attempted Seated SLR but too difficult and painful so modified to LAQ 2# 2X10 bilat ?Supine bridges X10 ?Supine clams green 2X10 ?Supine alternate marches 2X10 bilat ?Standing left hip flexor stretch 30 sec X3 with one UE support ? ? ?05/12/21 ?HEP review and performed 10 reps of Lt hip standing marches, abductions, 10 reps of seated SLR, and Nu step X 5 min L5 UE/LE ?  ?  ?PATIENT EDUCATION:  ?Education details: HEP,POC,exam findings ?Person educated: Patient ?Education method: Explanation, Demonstration, Verbal cues, and  Handouts ?Education comprehension: verbalized understanding and needs further education ?  ?  ?HOME EXERCISE PROGRAM: ?Access Code: [email or web address] ?URL: [email or web address] ?Date: 05/12/2021 ?Prepared by: Elsie Ra ?  ?Exercises ?- Standing Hip Flexor Stretch  - 2 x daily - 6 x weekly - 1 sets - 3 reps - 20 sec hold ?- Standing Marching  - 2 x daily - 6 x weekly - 1-2 sets - 10 reps ?-  Standing Hip Abduction with Counter Support  - 2 x daily - 6 x weekly - 1-2 sets - 10 reps ?- Seated Straight Leg Raise with Quad Contraction  - 2 x daily - 6 x weekly - 1 sets - 10 reps ?  ?ASSESSMENT: ?  ?CLINICAL IMPRESSION: [name] notes subjective progress since starting PT.  Hip abductors strength looks good with gait and exercises.  Hip flexors weakness is noted and is being addressed with her current program.  Given functional difficulties with stairs and her request to continue, Timia will benefit from the recommended course of supervised PT. ? ?05/12/21: Patient presents with signs and symptoms consistent with Lt hip flexor strain and general Lt hip weakness. She does have history of Lt THA 2021. Patient will benefit from skilled PT to address below impairments, limitations and improve overall function. ?  ?OBJECTIVE IMPAIRMENTS: decreased activity tolerance, difficulty walking, decreased balance, decreased endurance, decreased mobility, decreased ROM, decreased strength, impaired flexibility, impaired LE use, postural dysfunction, and pain. ?  ?ACTIVITY LIMITATIONS: bending, lifting, carry, locomotion, cleaning, community activity, driving ?  ?PERSONAL FACTORS: CKD, OA,sleep apnea,narcolepsy,Lt THA 2021 are also affecting patient's functional outcome. ?  ?REHAB POTENTIAL: Good ?  ?CLINICAL DECISION MAKING: Stable/uncomplicated ?  ?EVALUATION COMPLEXITY: Low ?  ?  ?  ?GOALS: ?Short term PT Goals Target date: 06/09/2021 ?Pt will be I and compliant with HEP. ?Baseline:  ?Goal status: On Going 06/03/2021 ?Pt will  decrease pain by 25% overall ?Baseline: ?Goal status: Met 06/03/2021 ?  ?Long term PT goals Target date: 07/07/2021 ?Pt will improve left hip flexion ROM to [facility] to improve functional mobility ?Baseline: ?Goal status: Improvin

## 2021-06-04 DIAGNOSIS — N2581 Secondary hyperparathyroidism of renal origin: Secondary | ICD-10-CM | POA: Diagnosis not present

## 2021-06-04 DIAGNOSIS — I120 Hypertensive chronic kidney disease with stage 5 chronic kidney disease or end stage renal disease: Secondary | ICD-10-CM | POA: Diagnosis not present

## 2021-06-04 DIAGNOSIS — M109 Gout, unspecified: Secondary | ICD-10-CM | POA: Diagnosis not present

## 2021-06-04 DIAGNOSIS — N185 Chronic kidney disease, stage 5: Secondary | ICD-10-CM | POA: Diagnosis not present

## 2021-06-04 DIAGNOSIS — M3214 Glomerular disease in systemic lupus erythematosus: Secondary | ICD-10-CM | POA: Diagnosis not present

## 2021-06-10 ENCOUNTER — Ambulatory Visit (INDEPENDENT_AMBULATORY_CARE_PROVIDER_SITE_OTHER): Payer: Medicare HMO | Admitting: Physical Therapy

## 2021-06-10 ENCOUNTER — Encounter: Payer: Self-pay | Admitting: Physical Therapy

## 2021-06-10 DIAGNOSIS — R262 Difficulty in walking, not elsewhere classified: Secondary | ICD-10-CM | POA: Diagnosis not present

## 2021-06-10 DIAGNOSIS — M6281 Muscle weakness (generalized): Secondary | ICD-10-CM | POA: Diagnosis not present

## 2021-06-10 DIAGNOSIS — M25552 Pain in left hip: Secondary | ICD-10-CM | POA: Diagnosis not present

## 2021-06-10 NOTE — Therapy (Signed)
?OUTPATIENT PHYSICAL THERAPY TREATMENT NOTE ? ? ?Patient Name: Colleen Obrien ?MRN: [id number] ?DOB:21-Aug-1955, 66 y.o., female ?Today's Date: 06/10/2021 ? ? ?END OF SESSION:  ? PT End of Session - 06/10/21 1114   ? ? Visit Number 4   ? Number of Visits 12   ? Date for PT Re-Evaluation 07/07/21   ? Authorization Type Aetna MCR   ? PT Start Time 1109   ? PT Stop Time 1147   ? PT Time Calculation (min) 38 min   ? Activity Tolerance Patient tolerated treatment well   ? Behavior During Therapy [facility] for tasks assessed/performed   ? ?  ?  ? ?  ? ? ? ?Past Medical History:  ?Diagnosis Date  ? Anemia in chronic kidney disease (CKD)   ? Arthritis   ? Asthma   ? Blood dyscrasia   ? systemic lupus  ? Chronic kidney disease (CKD), active medical management without dialysis, stage 4 (severe) ([place])   ? Dyspnea   ? GERD (gastroesophageal reflux disease)   ? Gout   ? Hypertension   ? Lupus ([place])   ? Narcolepsy   ? Pneumonia   ? Sleep apnea   ? ?Past Surgical History:  ?Procedure Laterality Date  ? CESAREAN SECTION    ? TOTAL HIP ARTHROPLASTY Left 08/26/2019  ? Procedure: LEFT TOTAL HIP ARTHROPLASTY ANTERIOR APPROACH;  Surgeon: Leandrew Koyanagi, MD;  Location: [place];  Service: Orthopedics;  Laterality: Left;  ? TUBAL LIGATION    ? ?Patient Active Problem List  ? Diagnosis Date Noted  ? Anemia of chronic renal failure 03/08/2021  ? Chronic renal disease, stage IV ([place]) 03/08/2021  ? 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 AVN (avascular necrosis of bone) ([place]) 07/08/2019  ? Diverticulosis of colon without hemorrhage 07/08/2019  ? 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 Tremor 03/02/2017  ? Shaking 08/23/2016  ? Excessive daytime sleepiness 08/23/2016  ? Obstructive sleep apnea 08/23/2016  ? ? ?THERAPY DIAG:  ?Difficulty in walking, not elsewhere classified ? ?Muscle weakness (generalized) ? ?Pain in left hip ? ?PCP: Colleen Ada, MD ?  ?REFERRING PROVIDER: Aundra Dubin, PA-C ?  ?REFERRING DIAG:  I34.742 (ICD-10-CM) - Status post total replacement of left hip ?  ?THERAPY DIAG:  ?Pain in left hip ?  ?Muscle weakness (generalized) ?  ?Difficulty in walking, not elsewhere classified ?  ?ONSET DATE: THA 2021 then fall in January 2023 ?  ?SUBJECTIVE:  ?  ?SUBJECTIVE STATEMENT: ?Colleen Obrien notes overall her hip is doing better but limited by gout today in her foot ?  ?PERTINENT HISTORY: CKD, OA,sleep apnea,narcolepsy,Lt THA 2021 ?  ?  ?PAIN:  ?Are you having pain? Yes: NPRS scale: 1-3/10 this week ?Pain location: left hip ?Pain description: ache and sore ?Aggravating factors: standing and prolonged walking, getting up from low chair  ?Relieving factors: tylenol ?  ?PRECAUTIONS: Lt Anterior hip replacement 2 years ago ?  ?WEIGHT BEARING RESTRICTIONS No ?  ?FALLS:  ?Has patient fallen in last 6 months? Yes 1, does not have a fear of falling or does not feel off balance ?  ?LIVING ENVIRONMENT: ?  ?Lives in: House/apartment ?Stairs: has stairs, 4 to enter and has difficulty with this, has stairs in home but seldom goes up to 2nd floor ?  ?OCCUPATION: retired ?  ?PLOF: Independent ?  ?PATIENT GOALS : reduce the pain, and walk more normal like she was before ?  ?  ?OBJECTIVE:  ?  ?DIAGNOSTIC  FINDINGS: Recent XR shows "Well-seated prosthesis without complication" ?  ?PATIENT SURVEYS:  ?05/12/21: FOTO 61% functional, improved to 63% on 5/11 ?  ?COGNITION: ?          Overall cognitive status: Within functional limits for tasks assessed               ?           ?SENSATION: ?WFL ?  ?MUSCLE LENGTH: ?Tight hip flexor and quads on Lt ?  ?PALPATION: ?TTP proximal quad and hip flexor on Lt with tightness felt ?  ?LE ROM: ?  ?Active ROM Left ?05/12/2021 Left ?06/10/21  ?Hip flexion 80 deg in standing   ?Hip extension     ?Hip abduction WFL sitting   ?Hip adduction     ?Hip internal rotation 30 sitting   ?Hip external rotation 20 sitting   ?Knee flexion WFL   ?Knee extension WFL   ?Ankle dorsiflexion     ?Ankle plantarflexion     ?Ankle  inversion     ?Ankle eversion     ? (Blank rows = not tested) ?  ?LE MMT: ?  ?MMT Left ?05/12/2021 Right ?06/11/10  ?Hip flexion 2 4  ?Hip extension     ?Hip abduction 3 4  ?Hip adduction 3 4  ?Hip internal rotation   4  ?Hip external rotation   4  ?Knee flexion 5 5  ?Knee extension 5 5  ?Ankle dorsiflexion     ?Ankle plantarflexion     ?Ankle inversion     ?Ankle eversion     ? (Blank rows = not tested) ?  ?LOWER EXTREMITY SPECIAL TESTS:  ?Pain with resisted hip flexion consistent with hip flexor strain ?  ?FUNCTIONAL TESTS:  ?5 times sit to stand: 25.4 seconds must use UE to push up ?  ?GAIT: ?05/12/21 ?Limited community Ambulator with antalgic gait on left and decreased velocity ?  ?  ?  ?TODAY'S TREATMENT: ?06/10/21 ?bike L2 X 6 min ?Leg press DL 43# 3X10 ?Step ups 6inch step X 10 fwd and lateral on left with one UE support ?Seated SLR 2X5 on left ?LAQ 3# 2X10 on left ?Supine hip adduction isometric 5 sec X 15 ?Supine bridges X15 ?Supine clams blue 2X10 ?Supine hip flexion isometric 5 sec 2X10 on left ?Supine hip flexors stretch with 1 leg/hip hanging off treatment table 4X 20 seconds (L only) ? ? ?06/03/2021 ?Therapeutic Exercises: ?Recumbent bike Seat 4 for 5 minutes ?Alternating hip hike 5X 3 seconds (looked good) ?Hip flexors isometrics seated and standing 10X 5 seconds each ?Supine hip flexors stretch with 1 leg/hip hanging off treatment table 4X 20 seconds (L only) ?Bridging 15X 5 seconds ?Side lie clams (to strengthen L hip abductors) 15X slow eccentrics ?Supine clams with Green T-band 10X ? ?Therapeutic Activities: ?Step-down 4 inch step (L side does the work) ?Step-up and over 4, 6 and 8  inch step handrail PRN (up L, down slowly on the R) ?  ?  ?PATIENT EDUCATION:  ?Education details: HEP,POC,exam findings ?Person educated: Patient ?Education method: Explanation, Demonstration, Verbal cues, and Handouts ?Education comprehension: verbalized understanding and needs further education ?  ?  ?HOME EXERCISE  PROGRAM: ?Access Code: [email or web address] ?URL: [email or web address] ?Date: 05/12/2021 ?Prepared by: Elsie Ra ?  ?Exercises ?- Standing Hip Flexor Stretch  - 2 x daily - 6 x weekly - 1 sets - 3 reps - 20 sec hold ?- Standing Marching  - 2 x daily - 6 x weekly - 1-2 sets -  10 reps ?- Standing Hip Abduction with Counter Support  - 2 x daily - 6 x weekly - 1-2 sets - 10 reps ?- Seated Straight Leg Raise with Quad Contraction  - 2 x daily - 6 x weekly - 1 sets - 10 reps ?  ?ASSESSMENT: ?  ?CLINICAL IMPRESSION: She is improving with PT and is having less overall hip pain. Her FOTO functional score and her strength measurements improved today. She met short term goal but has not yet met long term goals. She will continue to benefit from PT to maximize function.  ? ?05/12/21: Patient presents with signs and symptoms consistent with Lt hip flexor strain and general Lt hip weakness. She does have history of Lt THA 2021. Patient will benefit from skilled PT to address below impairments, limitations and improve overall function. ?  ?OBJECTIVE IMPAIRMENTS: decreased activity tolerance, difficulty walking, decreased balance, decreased endurance, decreased mobility, decreased ROM, decreased strength, impaired flexibility, impaired LE use, postural dysfunction, and pain. ?  ?ACTIVITY LIMITATIONS: bending, lifting, carry, locomotion, cleaning, community activity, driving ?  ?PERSONAL FACTORS: CKD, OA,sleep apnea,narcolepsy,Lt THA 2021 are also affecting patient's functional outcome. ?  ?REHAB POTENTIAL: Good ?  ?CLINICAL DECISION MAKING: Stable/uncomplicated ?  ?EVALUATION COMPLEXITY: Low ?  ?  ?  ?GOALS: ?Short term PT Goals Target date: 06/09/2021 ?Pt will be I and compliant with HEP. ?Baseline:  ?Goal status: MET 5/11 ?Pt will decrease pain by 25% overall ?Baseline: ?Goal status: Met 06/03/2021 ?  ?Long term PT goals Target date: 07/07/2021 ?Pt will improve left hip flexion ROM to [facility] to improve functional  mobility ?Baseline: ?Goal status: Improving and needs continued attention 06/03/2021 ?Pt will improve  hip strength to at least 4+/5 MMT to improve functional strength ?Baseline: ?Goal status: Hip flexors weakness present 06/03/2021

## 2021-06-14 ENCOUNTER — Inpatient Hospital Stay (HOSPITAL_COMMUNITY)
Admission: RE | Admit: 2021-06-14 | Discharge: 2021-06-14 | Disposition: A | Discharge status: Home / Self Care | Payer: Medicare HMO | Source: Ambulatory Visit

## 2021-06-16 ENCOUNTER — Encounter (HOSPITAL_COMMUNITY): Payer: Medicare HMO

## 2021-06-17 ENCOUNTER — Encounter: Payer: Self-pay | Admitting: Physical Therapy

## 2021-06-17 ENCOUNTER — Ambulatory Visit (INDEPENDENT_AMBULATORY_CARE_PROVIDER_SITE_OTHER): Payer: Medicare HMO | Admitting: Physical Therapy

## 2021-06-17 DIAGNOSIS — M25552 Pain in left hip: Secondary | ICD-10-CM | POA: Diagnosis not present

## 2021-06-17 DIAGNOSIS — R262 Difficulty in walking, not elsewhere classified: Secondary | ICD-10-CM

## 2021-06-17 DIAGNOSIS — M6281 Muscle weakness (generalized): Secondary | ICD-10-CM | POA: Diagnosis not present

## 2021-06-17 NOTE — Therapy (Signed)
OUTPATIENT PHYSICAL THERAPY TREATMENT NOTE/Discharge PHYSICAL THERAPY DISCHARGE SUMMARY  Visits from Start of Care: 5  Current functional level related to goals / functional outcomes: See note   Remaining deficits: See note   Education / Equipment: HEP  Plan: Patient agrees to discharge.  Patient goals were met. Patient is being discharged due to meeting the stated rehab goals and she is at a good functional level.         Patient Name: Colleen Obrien MRN: [id number] DOB:Aug 03, 1955, 66 y.o., female Today's Date: 06/17/2021   END OF SESSION:   PT End of Session - 06/17/21 1109     Visit Number 5    Number of Visits 12    Date for PT Re-Evaluation 07/07/21    Authorization Type [employer] MCR    PT Start Time 1104    PT Stop Time 1144    PT Time Calculation (min) 40 min    Activity Tolerance Patient tolerated treatment well    Behavior During Therapy WFL for tasks assessed/performed              Past Medical History:  Diagnosis Date   Anemia in chronic kidney disease (CKD)    Arthritis    Asthma    Blood dyscrasia    systemic lupus   Chronic kidney disease (CKD), active medical management without dialysis, stage 4 (severe) (HCC)    Dyspnea    GERD (gastroesophageal reflux disease)    Gout    Hypertension    Lupus ([place])    Narcolepsy    Pneumonia    Sleep apnea    Past Surgical History:  Procedure Laterality Date   CESAREAN SECTION     TOTAL HIP ARTHROPLASTY Left 08/26/2019   Procedure: LEFT TOTAL HIP ARTHROPLASTY ANTERIOR APPROACH;  Surgeon: Colleen Koyanagi, MD;  Location: [facility];  Service: Orthopedics;  Laterality: Left;   TUBAL LIGATION     Patient Active Problem List   Diagnosis Date Noted   Anemia of chronic renal failure 03/08/2021   Chronic renal disease, stage IV ([place]) 03/08/2021   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   AVN (avascular necrosis of bone) ([place]) 07/08/2019   Diverticulosis of colon without hemorrhage  07/08/2019   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   Tremor 03/02/2017   Shaking 08/23/2016   Excessive daytime sleepiness 08/23/2016   Obstructive sleep apnea 08/23/2016    THERAPY DIAG:  Difficulty in walking, not elsewhere classified  Muscle weakness (generalized)  Pain in left hip  PCP: Colleen Ada, MD   REFERRING PROVIDER: Aundra Dubin, PA-C   REFERRING DIAG: [phone] (ICD-10-CM) - Status post total replacement of left hip   THERAPY DIAG:  Pain in left hip   Muscle weakness (generalized)   Difficulty in walking, not elsewhere classified   ONSET DATE: THA 2021 then fall in January 2023   SUBJECTIVE:    SUBJECTIVE STATEMENT: Colleen Obrien notes overall her hip feels much better and she agrees to DC today   PERTINENT HISTORY: CKD, OA,sleep apnea,narcolepsy,Lt THA 2021     PAIN:  Are you having pain? Yes: NPRS scale: 1-3/10 this week Pain location: left hip Pain description: ache and sore Aggravating factors: standing and prolonged walking, getting up from low chair  Relieving factors: tylenol   PRECAUTIONS: Lt Anterior hip replacement 2 years ago   WEIGHT BEARING RESTRICTIONS No   FALLS:  Has patient fallen in last 6 months? Yes 1, does not have a fear of falling or  does not feel off balance   LIVING ENVIRONMENT:   Lives in: House/apartment Stairs: has stairs, 4 to enter and has difficulty with this, has stairs in home but seldom goes up to 2nd floor   OCCUPATION: retired   PLOF: Independent   PATIENT GOALS : reduce the pain, and walk more normal like she was before     OBJECTIVE:    DIAGNOSTIC FINDINGS: Recent XR shows "Well-seated prosthesis without complication"   PATIENT SURVEYS:  05/12/21: FOTO 61% functional, improved to 63% on 5/11, improved to 72% on 5/18   COGNITION:           Overall cognitive status: Within functional limits for tasks assessed                          SENSATION: WFL   MUSCLE LENGTH: Tight hip flexor and  quads on Lt   PALPATION: TTP proximal quad and hip flexor on Lt with tightness felt   LE ROM:   Active ROM Left 05/12/2021 Left 06/10/21  Hip flexion 80 deg in standing 90 deg in standing  Hip extension     Hip abduction WFL sitting   Hip adduction     Hip internal rotation 30 sitting   Hip external rotation 20 sitting   Knee flexion WFL   Knee extension WFL   Ankle dorsiflexion     Ankle plantarflexion     Ankle inversion     Ankle eversion      (Blank rows = not tested)   LE MMT:   MMT Left 05/12/2021 Right 06/11/10  Hip flexion 2 4+  Hip extension     Hip abduction 3 4+  Hip adduction 3 4+  Hip internal rotation   4+  Hip external rotation   4+  Knee flexion 5 5  Knee extension 5 5  Ankle dorsiflexion     Ankle plantarflexion     Ankle inversion     Ankle eversion      (Blank rows = not tested)   LOWER EXTREMITY SPECIAL TESTS:  Pain with resisted hip flexion consistent with hip flexor strain   FUNCTIONAL TESTS:  05/12/21 5 times sit to stand: 25.4 seconds must use UE to push up 06/17/21 5TSTS 13.5 seconds, she uses UE resting on knees to push up and no longer has to use arm rests to push   GAIT: 05/12/21 Limited community Ambulator with antalgic gait on left and decreased velocity       TODAY'S TREATMENT: 06/17/21 Nu step L6-5 X 6 min, seat #6 Leg press DL 50# 3X10 Step ups 6inch step X 10 fwd and lateral bilat legs with one UE support Standing hip abd, extension, and march 2# X 15 ea bilat Seated SLR 2X10 bilat Seated hip adduction isometric 5 sec X 15 Seated clam blue X 20 Sit to stand 5 times, see time above in objective measurements Standing hip flexor stretch on left 3 X 30 sec  06/10/21 bike L2 X 6 min Leg press DL 43# 3X10 Step ups 6inch step X 10 fwd and lateral on left with one UE support Seated SLR 2X5 on left LAQ 3# 2X10 on left Supine hip adduction isometric 5 sec X 15 Supine bridges X15 Supine clams blue 2X10 Supine hip flexion  isometric 5 sec 2X10 on left     PATIENT EDUCATION:  Education details: HEP,POC,exam findings Person educated: Patient Education method: Explanation, Demonstration, Verbal cues, and Handouts Education comprehension:  verbalized understanding and needs further education     HOME EXERCISE PROGRAM: Access Code: [email or web address] URL: [email or web address] Date: 05/12/2021 Prepared by: Elsie Ra   Exercises - Standing Hip Flexor Stretch  - 2 x daily - 6 x weekly - 1 sets - 3 reps - 20 sec hold - Standing Marching  - 2 x daily - 6 x weekly - 1-2 sets - 10 reps - Standing Hip Abduction with Counter Support  - 2 x daily - 6 x weekly - 1-2 sets - 10 reps - Seated Straight Leg Raise with Quad Contraction  - 2 x daily - 6 x weekly - 1 sets - 10 reps   ASSESSMENT:   CLINICAL IMPRESSION: She has made great overall progress with PT and not having much pain anymore. She is at at a good functional level and has now met her PT goals. She will be discharged due to her progress and she had no further questions or concerns.    OBJECTIVE IMPAIRMENTS: decreased activity tolerance, difficulty walking, decreased balance, decreased endurance, decreased mobility, decreased ROM, decreased strength, impaired flexibility, impaired LE use, postural dysfunction, and pain.   ACTIVITY LIMITATIONS: bending, lifting, carry, locomotion, cleaning, community activity, driving   PERSONAL FACTORS: CKD, OA,sleep apnea,narcolepsy,Lt THA 2021 are also affecting patient's functional outcome.   REHAB POTENTIAL: Good   CLINICAL DECISION MAKING: Stable/uncomplicated   EVALUATION COMPLEXITY: Low       GOALS: Short term PT Goals Target date: 06/09/2021 Pt will be I and compliant with HEP. Baseline:  Goal status: MET 5/11 Pt will decrease pain by 25% overall Baseline: Goal status: Met 06/03/2021   Long term PT goals Target date: 07/07/2021 Pt will improve left hip flexion ROM to [facility] to improve functional  mobility Baseline: Goal status: now MET Pt will improve  hip strength to at least 4+/5 MMT to improve functional strength Baseline: Goal status: now MET Pt will improve FOTO to at least 74% functional to show improved function Baseline: Goal status: mostly met (72%) Pt will reduce pain by overall 50% overall with usual activity, getting up from chairs, and ambulation Baseline: Goal status: MET        5. Pt will improve 5 times sit to stand to less than 18 seconds to show improved leg strength and endurance  MET 13.5 seconds on 06/17/21   PLAN: PT FREQUENCY: 1-2 times per week    PT DURATION: 4-6 weeks   PLANNED INTERVENTIONS (unless contraindicated): aquatic PT, Canalith repositioning, cryotherapy, Electrical stimulation, Iontophoresis with 4 mg/ml dexamethasome, Moist heat, traction, Ultrasound, gait training, Therapeutic exercise, balance training, neuromuscular re-education, patient/family education, prosthetic training, manual techniques, passive ROM, dry needling, taping, vasopnuematic device, vestibular, spinal manipulations, joint manipulations   PLAN FOR NEXT SESSION:  DC today   Debbe Odea, PT, DPT 06/17/2021, 11:10 AM

## 2021-06-21 ENCOUNTER — Encounter (HOSPITAL_COMMUNITY): Payer: Medicare HMO

## 2021-06-23 DIAGNOSIS — M6281 Muscle weakness (generalized): Secondary | ICD-10-CM | POA: Diagnosis not present

## 2021-06-23 DIAGNOSIS — M2560 Stiffness of unspecified joint, not elsewhere classified: Secondary | ICD-10-CM | POA: Diagnosis not present

## 2021-06-23 DIAGNOSIS — M25511 Pain in right shoulder: Secondary | ICD-10-CM | POA: Diagnosis not present

## 2021-06-23 DIAGNOSIS — S42291D Other displaced fracture of upper end of right humerus, subsequent encounter for fracture with routine healing: Secondary | ICD-10-CM | POA: Diagnosis not present

## 2021-06-25 DIAGNOSIS — M810 Age-related osteoporosis without current pathological fracture: Secondary | ICD-10-CM | POA: Diagnosis not present

## 2021-06-25 DIAGNOSIS — S42291D Other displaced fracture of upper end of right humerus, subsequent encounter for fracture with routine healing: Secondary | ICD-10-CM | POA: Diagnosis not present

## 2021-06-30 ENCOUNTER — Encounter (HOSPITAL_COMMUNITY)
Admission: RE | Admit: 2021-06-30 | Discharge: 2021-06-30 | Disposition: A | Discharge status: Home / Self Care | Payer: Medicare HMO | Source: Ambulatory Visit | Attending: Nephrology | Admitting: Nephrology

## 2021-06-30 VITALS — BP 142/104 | HR 77 | Temp 97.4°F | Resp 18

## 2021-06-30 DIAGNOSIS — D631 Anemia in chronic kidney disease: Secondary | ICD-10-CM | POA: Diagnosis not present

## 2021-06-30 DIAGNOSIS — N189 Chronic kidney disease, unspecified: Secondary | ICD-10-CM | POA: Diagnosis not present

## 2021-06-30 LAB — POCT HEMOGLOBIN-HEMACUE: Hemoglobin: 9.1 g/dL — ABNORMAL LOW (ref 12.0–15.0)

## 2021-06-30 MED ORDER — EPOETIN ALFA-EPBX 10000 UNIT/ML IJ SOLN
20000.0000 [IU] | Freq: Once | INTRAMUSCULAR | Status: AC
  Administered 2021-06-30: 20000 [IU] via SUBCUTANEOUS

## 2021-06-30 MED ORDER — EPOETIN ALFA-EPBX 10000 UNIT/ML IJ SOLN
INTRAMUSCULAR | Status: AC
Start: 2021-06-30 — End: 2021-06-30
  Filled 2021-06-30: qty 2

## 2021-07-03 IMAGING — DX DG PORTABLE PELVIS
1 series · 1 of 1 positions shown · non-contrast
Comparison: 08/26/2019

CLINICAL DATA: Left hip replacement

EXAM:
PORTABLE PELVIS 1-2 VIEWS

[pelvis ap]
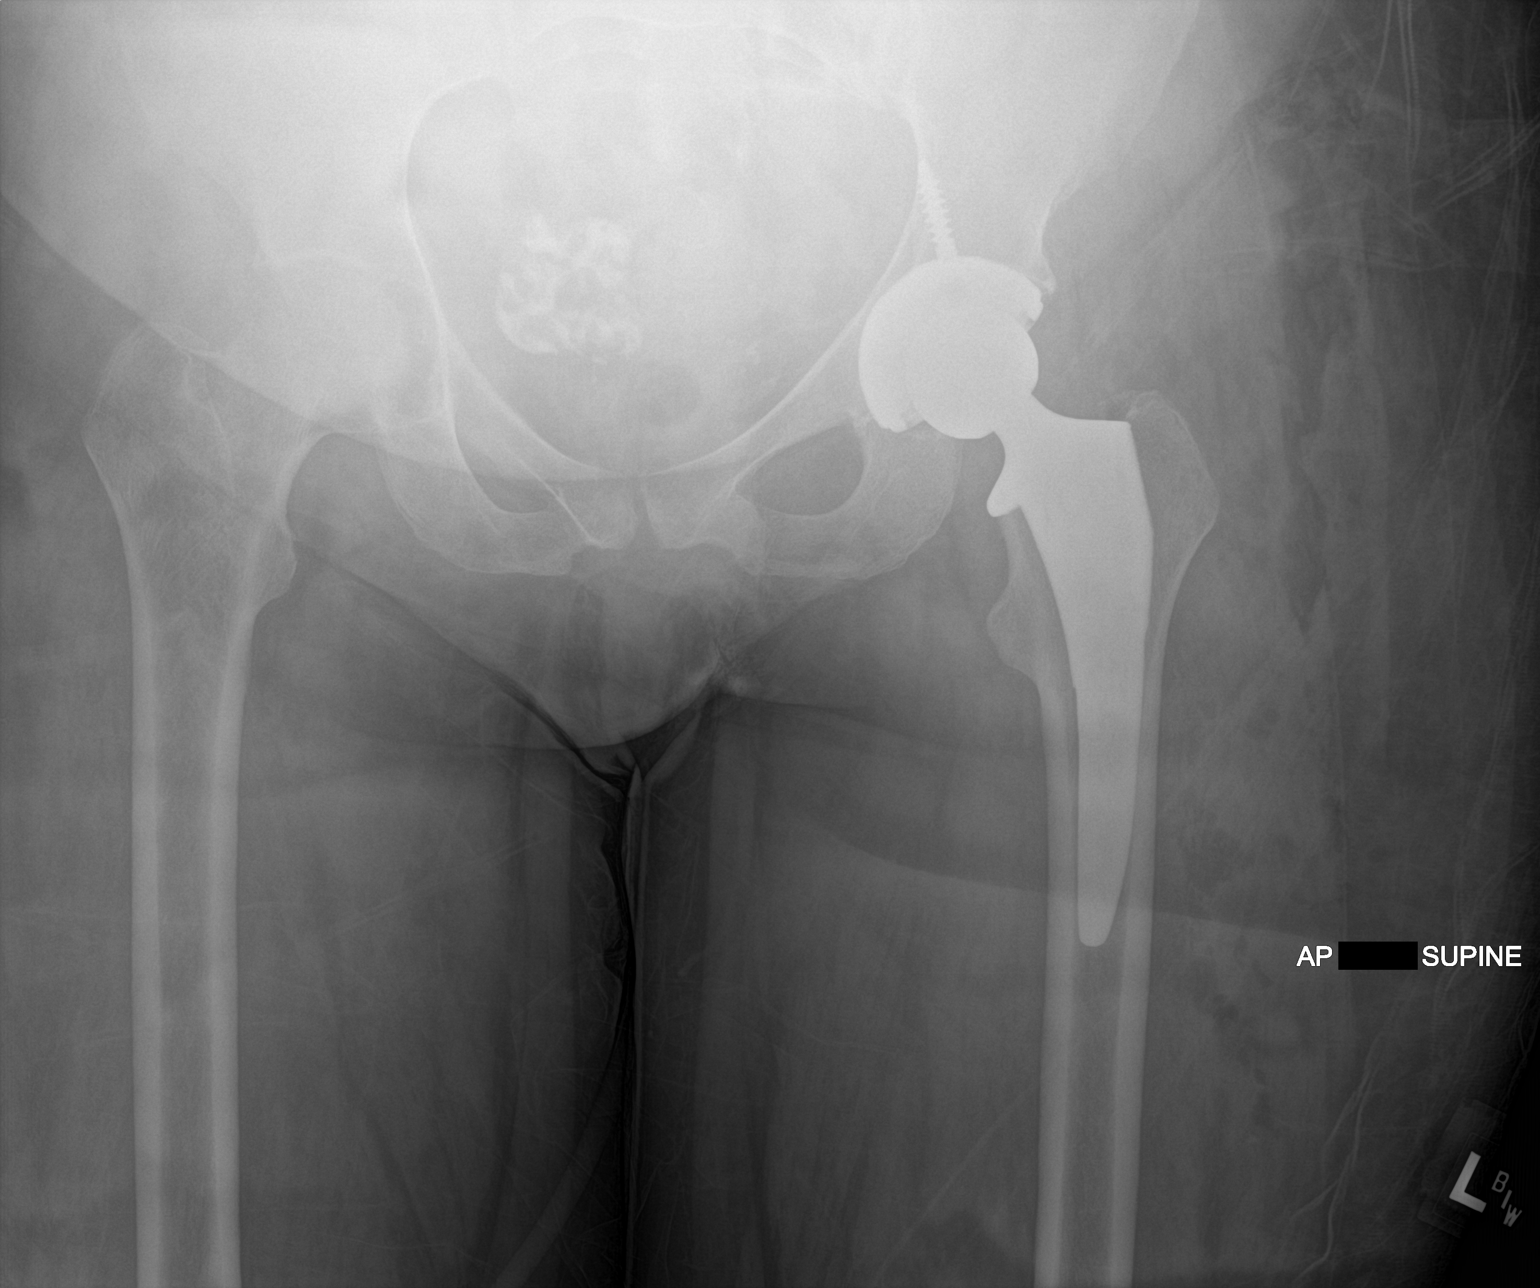

[1 of 1 positions shown; findings below may reference images not displayed]

FINDINGS: Left hip replacement in satisfactory position alignment. No fracture
or complication. Calcified uterine fibroid on the right.
IMPRESSION: Satisfactory left hip replacement.

## 2021-07-13 DIAGNOSIS — Z6834 Body mass index (BMI) 34.0-34.9, adult: Secondary | ICD-10-CM | POA: Diagnosis not present

## 2021-07-13 DIAGNOSIS — M810 Age-related osteoporosis without current pathological fracture: Secondary | ICD-10-CM | POA: Diagnosis not present

## 2021-07-13 DIAGNOSIS — M109 Gout, unspecified: Secondary | ICD-10-CM | POA: Diagnosis not present

## 2021-07-13 DIAGNOSIS — K219 Gastro-esophageal reflux disease without esophagitis: Secondary | ICD-10-CM | POA: Diagnosis not present

## 2021-07-13 DIAGNOSIS — N189 Chronic kidney disease, unspecified: Secondary | ICD-10-CM | POA: Diagnosis not present

## 2021-07-13 DIAGNOSIS — I129 Hypertensive chronic kidney disease with stage 1 through stage 4 chronic kidney disease, or unspecified chronic kidney disease: Secondary | ICD-10-CM | POA: Diagnosis not present

## 2021-07-13 DIAGNOSIS — J45909 Unspecified asthma, uncomplicated: Secondary | ICD-10-CM | POA: Diagnosis not present

## 2021-07-13 DIAGNOSIS — E669 Obesity, unspecified: Secondary | ICD-10-CM | POA: Diagnosis not present

## 2021-07-13 DIAGNOSIS — L309 Dermatitis, unspecified: Secondary | ICD-10-CM | POA: Diagnosis not present

## 2021-07-13 DIAGNOSIS — M329 Systemic lupus erythematosus, unspecified: Secondary | ICD-10-CM | POA: Diagnosis not present

## 2021-07-13 DIAGNOSIS — Z7951 Long term (current) use of inhaled steroids: Secondary | ICD-10-CM | POA: Diagnosis not present

## 2021-07-13 DIAGNOSIS — G47419 Narcolepsy without cataplexy: Secondary | ICD-10-CM | POA: Diagnosis not present

## 2021-07-14 ENCOUNTER — Ambulatory Visit (HOSPITAL_COMMUNITY)
Admission: RE | Admit: 2021-07-14 | Discharge: 2021-07-14 | Disposition: A | Discharge status: Home / Self Care | Payer: Medicare HMO | Source: Ambulatory Visit | Attending: Nephrology | Admitting: Nephrology

## 2021-07-14 DIAGNOSIS — D631 Anemia in chronic kidney disease: Secondary | ICD-10-CM | POA: Insufficient documentation

## 2021-07-14 DIAGNOSIS — N189 Chronic kidney disease, unspecified: Secondary | ICD-10-CM | POA: Insufficient documentation

## 2021-07-14 MED ORDER — SODIUM CHLORIDE 0.9 % IV SOLN
510.0000 mg | INTRAVENOUS | Status: DC
  Administered 2021-07-14: 510 mg via INTRAVENOUS
  Filled 2021-07-14: qty 510

## 2021-07-20 DIAGNOSIS — M329 Systemic lupus erythematosus, unspecified: Secondary | ICD-10-CM | POA: Diagnosis not present

## 2021-07-20 DIAGNOSIS — M1A09X1 Idiopathic chronic gout, multiple sites, with tophus (tophi): Secondary | ICD-10-CM | POA: Diagnosis not present

## 2021-07-20 DIAGNOSIS — R0602 Shortness of breath: Secondary | ICD-10-CM | POA: Diagnosis not present

## 2021-07-22 DIAGNOSIS — S42291D Other displaced fracture of upper end of right humerus, subsequent encounter for fracture with routine healing: Secondary | ICD-10-CM | POA: Diagnosis not present

## 2021-07-22 DIAGNOSIS — M25511 Pain in right shoulder: Secondary | ICD-10-CM | POA: Diagnosis not present

## 2021-07-22 DIAGNOSIS — M6281 Muscle weakness (generalized): Secondary | ICD-10-CM | POA: Diagnosis not present

## 2021-07-22 DIAGNOSIS — M2560 Stiffness of unspecified joint, not elsewhere classified: Secondary | ICD-10-CM | POA: Diagnosis not present

## 2021-07-28 ENCOUNTER — Encounter (HOSPITAL_COMMUNITY)
Admission: RE | Admit: 2021-07-28 | Discharge: 2021-07-28 | Disposition: A | Discharge status: Home / Self Care | Payer: Medicare HMO | Source: Ambulatory Visit | Attending: Nephrology | Admitting: Nephrology

## 2021-07-28 VITALS — BP 125/83 | HR 64 | Temp 97.9°F | Resp 18

## 2021-07-28 DIAGNOSIS — D631 Anemia in chronic kidney disease: Secondary | ICD-10-CM | POA: Insufficient documentation

## 2021-07-28 DIAGNOSIS — N189 Chronic kidney disease, unspecified: Secondary | ICD-10-CM | POA: Diagnosis present

## 2021-07-28 LAB — RENAL FUNCTION PANEL
Albumin: 3.3 g/dL — ABNORMAL LOW (ref 3.5–5.0)
Anion gap: 11 (ref 5–15)
BUN: 69 mg/dL — ABNORMAL HIGH (ref 8–23)
CO2: 17 mmol/L — ABNORMAL LOW (ref 22–32)
Calcium: 9.8 mg/dL (ref 8.9–10.3)
Chloride: 108 mmol/L (ref 98–111)
Creatinine, Ser: 4.09 mg/dL — ABNORMAL HIGH (ref 0.44–1.00)
GFR, Estimated: 12 mL/min — ABNORMAL LOW (ref >60)
Glucose, Bld: 97 mg/dL (ref 70–99)
Phosphorus: 5.5 mg/dL — ABNORMAL HIGH (ref 2.5–4.6)
Potassium: 4.5 mmol/L (ref 3.5–5.1)
Sodium: 136 mmol/L (ref 135–145)

## 2021-07-28 LAB — POCT HEMOGLOBIN-HEMACUE: Hemoglobin: 11 g/dL — ABNORMAL LOW (ref 12.0–15.0)

## 2021-07-28 MED ORDER — EPOETIN ALFA-EPBX 10000 UNIT/ML IJ SOLN
INTRAMUSCULAR | Status: AC
  Administered 2021-07-28: 20000 [IU] via SUBCUTANEOUS
  Filled 2021-07-28: qty 2

## 2021-07-28 MED ORDER — SODIUM CHLORIDE 0.9 % IV SOLN
510.0000 mg | Freq: Once | INTRAVENOUS | Status: AC
  Administered 2021-07-28: 510 mg via INTRAVENOUS
  Filled 2021-07-28: qty 17

## 2021-07-28 MED ORDER — EPOETIN ALFA-EPBX 10000 UNIT/ML IJ SOLN: 20000.0000 [IU] | Freq: Once | INTRAMUSCULAR | Status: AC

## 2021-08-05 DIAGNOSIS — S42291D Other displaced fracture of upper end of right humerus, subsequent encounter for fracture with routine healing: Secondary | ICD-10-CM | POA: Diagnosis not present

## 2021-08-05 DIAGNOSIS — M6281 Muscle weakness (generalized): Secondary | ICD-10-CM | POA: Diagnosis not present

## 2021-08-05 DIAGNOSIS — M2560 Stiffness of unspecified joint, not elsewhere classified: Secondary | ICD-10-CM | POA: Diagnosis not present

## 2021-08-05 DIAGNOSIS — M25511 Pain in right shoulder: Secondary | ICD-10-CM | POA: Diagnosis not present

## 2021-08-06 DIAGNOSIS — N185 Chronic kidney disease, stage 5: Secondary | ICD-10-CM | POA: Diagnosis not present

## 2021-08-06 DIAGNOSIS — I120 Hypertensive chronic kidney disease with stage 5 chronic kidney disease or end stage renal disease: Secondary | ICD-10-CM | POA: Diagnosis not present

## 2021-08-06 DIAGNOSIS — M109 Gout, unspecified: Secondary | ICD-10-CM | POA: Diagnosis not present

## 2021-08-06 DIAGNOSIS — N2581 Secondary hyperparathyroidism of renal origin: Secondary | ICD-10-CM | POA: Diagnosis not present

## 2021-08-06 DIAGNOSIS — N390 Urinary tract infection, site not specified: Secondary | ICD-10-CM | POA: Diagnosis not present

## 2021-08-06 DIAGNOSIS — M3214 Glomerular disease in systemic lupus erythematosus: Secondary | ICD-10-CM | POA: Diagnosis not present

## 2021-08-09 DIAGNOSIS — L659 Nonscarring hair loss, unspecified: Secondary | ICD-10-CM | POA: Diagnosis not present

## 2021-08-09 DIAGNOSIS — L810 Postinflammatory hyperpigmentation: Secondary | ICD-10-CM | POA: Diagnosis not present

## 2021-08-09 DIAGNOSIS — R69 Illness, unspecified: Secondary | ICD-10-CM | POA: Diagnosis not present

## 2021-08-09 DIAGNOSIS — L281 Prurigo nodularis: Secondary | ICD-10-CM | POA: Diagnosis not present

## 2021-08-09 DIAGNOSIS — L300 Nummular dermatitis: Secondary | ICD-10-CM | POA: Diagnosis not present

## 2021-08-10 DIAGNOSIS — H40003 Preglaucoma, unspecified, bilateral: Secondary | ICD-10-CM | POA: Diagnosis not present

## 2021-08-10 DIAGNOSIS — M329 Systemic lupus erythematosus, unspecified: Secondary | ICD-10-CM | POA: Diagnosis not present

## 2021-08-10 DIAGNOSIS — Z79899 Other long term (current) drug therapy: Secondary | ICD-10-CM | POA: Diagnosis not present

## 2021-08-12 DIAGNOSIS — M2560 Stiffness of unspecified joint, not elsewhere classified: Secondary | ICD-10-CM | POA: Diagnosis not present

## 2021-08-12 DIAGNOSIS — M6281 Muscle weakness (generalized): Secondary | ICD-10-CM | POA: Diagnosis not present

## 2021-08-12 DIAGNOSIS — S42291D Other displaced fracture of upper end of right humerus, subsequent encounter for fracture with routine healing: Secondary | ICD-10-CM | POA: Diagnosis not present

## 2021-08-12 DIAGNOSIS — M25511 Pain in right shoulder: Secondary | ICD-10-CM | POA: Diagnosis not present

## 2021-08-20 ENCOUNTER — Ambulatory Visit: Payer: Medicare HMO | Admitting: Orthopaedic Surgery

## 2021-08-20 ENCOUNTER — Encounter: Payer: Self-pay | Admitting: Orthopaedic Surgery

## 2021-08-20 DIAGNOSIS — Z96642 Presence of left artificial hip joint: Secondary | ICD-10-CM | POA: Diagnosis not present

## 2021-08-20 DIAGNOSIS — G4733 Obstructive sleep apnea (adult) (pediatric): Secondary | ICD-10-CM | POA: Diagnosis not present

## 2021-08-20 NOTE — Progress Notes (Signed)
Office Visit Note   Patient: Colleen Obrien           Date of Birth: Oct 25, 1955           MRN: [id number] Visit Date: 08/20/2021              Requested by: Carol Ada, MD [place],  [place] [id number] PCP: Carol Ada, MD   Assessment & Plan: Visit Diagnoses:  1. Status post total replacement of left hip     Plan: Manju is here for 2-year follow-up status post left total hip replacement on 08/26/2019.  Doing well overall.  Occasional thigh pain in the muscle with increased activity.  Finished physical therapy.  Had a fall back in March and x-rays were taken at that time.  Examination left hip shows a fully healed surgical scar.  Excellent range of motion without pain.  Normal gait.   [name] is now 2 years from the hip replacement has done very well.  She is very satisfied.  Dental prophylaxis is no longer necessary.  We will see her back as needed.  Follow-Up Instructions: No follow-ups on file.   Orders:  No orders of the defined types were placed in this encounter.  No orders of the defined types were placed in this encounter.     Procedures: No procedures performed   Clinical Data: No additional findings.   Subjective: Chief Complaint  Patient presents with   Left Hip - Follow-up    Left THA 08/26/2019    HPI  Review of Systems   Objective: Vital Signs: There were no vitals taken for this visit.  Physical Exam  Ortho Exam  Specialty Comments:  No specialty comments available.  Imaging: No results found.   PMFS History: Patient Active Problem List   Diagnosis Date Noted   Anemia of chronic renal failure 03/08/2021   Chronic renal disease, stage IV ([place]) 03/08/2021   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   AVN (avascular necrosis of bone) ([place]) 07/08/2019   Diverticulosis of colon without hemorrhage 07/08/2019   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   Tremor 03/02/2017    Shaking 08/23/2016   Excessive daytime sleepiness 08/23/2016   Obstructive sleep apnea 08/23/2016   Past Medical History:  Diagnosis Date   Anemia in chronic kidney disease (CKD)    Arthritis    Asthma    Blood dyscrasia    systemic lupus   Chronic kidney disease (CKD), active medical management without dialysis, stage 4 (severe) (HCC)    Dyspnea    GERD (gastroesophageal reflux disease)    Gout    Hypertension    Lupus ([place])    Narcolepsy    Pneumonia    Sleep apnea     Family History  Problem Relation Age of Onset   Hypertension Mother    Diabetes Mother    Dementia Father    Hypertension Father    Hyperlipidemia Father    Diabetes Paternal Grandmother    Alcohol abuse Paternal Grandfather     Past Surgical History:  Procedure Laterality Date   CESAREAN SECTION     TOTAL HIP ARTHROPLASTY Left 08/26/2019   Procedure: LEFT TOTAL HIP ARTHROPLASTY ANTERIOR APPROACH;  Surgeon: Leandrew Koyanagi, MD;  Location: [facility];  Service: Orthopedics;  Laterality: Left;   TUBAL LIGATION     Social History   Occupational History   Occupation: [employer]  Tobacco Use   Smoking status: Never  Smokeless tobacco: Never  Vaping Use   Vaping Use: Never used  Substance and Sexual Activity   Alcohol use: No   Drug use: No   Sexual activity: Not on file

## 2021-08-25 ENCOUNTER — Ambulatory Visit (HOSPITAL_COMMUNITY): Admission: RE | Admit: 2021-08-25 | Payer: Medicare HMO | Source: Ambulatory Visit

## 2021-08-25 ENCOUNTER — Encounter (HOSPITAL_COMMUNITY): Payer: Self-pay

## 2021-08-26 DIAGNOSIS — M8589 Other specified disorders of bone density and structure, multiple sites: Secondary | ICD-10-CM | POA: Diagnosis not present

## 2021-08-26 DIAGNOSIS — Z780 Asymptomatic menopausal state: Secondary | ICD-10-CM | POA: Diagnosis not present

## 2021-08-26 DIAGNOSIS — M810 Age-related osteoporosis without current pathological fracture: Secondary | ICD-10-CM | POA: Diagnosis not present

## 2021-08-26 DIAGNOSIS — Z13820 Encounter for screening for osteoporosis: Secondary | ICD-10-CM | POA: Diagnosis not present

## 2021-08-27 DIAGNOSIS — Z7682 Awaiting organ transplant status: Secondary | ICD-10-CM | POA: Diagnosis not present

## 2021-08-30 ENCOUNTER — Encounter (HOSPITAL_COMMUNITY): Payer: Medicare HMO

## 2021-09-03 ENCOUNTER — Ambulatory Visit (HOSPITAL_COMMUNITY)
Admission: RE | Admit: 2021-09-03 | Discharge: 2021-09-03 | Disposition: A | Discharge status: Home / Self Care | Payer: Medicare HMO | Source: Ambulatory Visit | Attending: Nephrology | Admitting: Nephrology

## 2021-09-03 VITALS — BP 143/99 | HR 70 | Temp 97.0°F | Resp 18

## 2021-09-03 DIAGNOSIS — D631 Anemia in chronic kidney disease: Secondary | ICD-10-CM | POA: Insufficient documentation

## 2021-09-03 DIAGNOSIS — N189 Chronic kidney disease, unspecified: Secondary | ICD-10-CM | POA: Insufficient documentation

## 2021-09-03 MED ORDER — EPOETIN ALFA-EPBX 40000 UNIT/ML IJ SOLN: 20000.0000 [IU] | Freq: Once | INTRAMUSCULAR | Status: DC

## 2021-09-03 MED ORDER — EPOETIN ALFA 20000 UNIT/ML IJ SOLN: 20000.0000 [IU] | Freq: Once | INTRAMUSCULAR | Status: DC

## 2021-09-06 LAB — POCT HEMOGLOBIN-HEMACUE: Hemoglobin: 12.2 g/dL (ref 12.0–15.0)

## 2021-09-09 DIAGNOSIS — L810 Postinflammatory hyperpigmentation: Secondary | ICD-10-CM | POA: Diagnosis not present

## 2021-09-09 DIAGNOSIS — L300 Nummular dermatitis: Secondary | ICD-10-CM | POA: Diagnosis not present

## 2021-09-09 DIAGNOSIS — L659 Nonscarring hair loss, unspecified: Secondary | ICD-10-CM | POA: Diagnosis not present

## 2021-09-22 ENCOUNTER — Encounter (HOSPITAL_COMMUNITY): Payer: Medicare HMO

## 2021-09-27 ENCOUNTER — Encounter (HOSPITAL_COMMUNITY): Payer: Medicare HMO

## 2021-10-01 ENCOUNTER — Encounter (HOSPITAL_COMMUNITY): Payer: Medicare HMO

## 2021-10-05 DIAGNOSIS — M3214 Glomerular disease in systemic lupus erythematosus: Secondary | ICD-10-CM | POA: Diagnosis not present

## 2021-10-05 DIAGNOSIS — I120 Hypertensive chronic kidney disease with stage 5 chronic kidney disease or end stage renal disease: Secondary | ICD-10-CM | POA: Diagnosis not present

## 2021-10-05 DIAGNOSIS — N185 Chronic kidney disease, stage 5: Secondary | ICD-10-CM | POA: Diagnosis not present

## 2021-10-05 DIAGNOSIS — M109 Gout, unspecified: Secondary | ICD-10-CM | POA: Diagnosis not present

## 2021-10-05 DIAGNOSIS — N2581 Secondary hyperparathyroidism of renal origin: Secondary | ICD-10-CM | POA: Diagnosis not present

## 2021-10-07 ENCOUNTER — Encounter (HOSPITAL_COMMUNITY)
Admission: RE | Admit: 2021-10-07 | Discharge: 2021-10-07 | Disposition: A | Discharge status: Home / Self Care | Payer: Medicare HMO | Source: Ambulatory Visit | Attending: Nephrology | Admitting: Nephrology

## 2021-10-07 VITALS — BP 140/101 | HR 66 | Temp 97.4°F | Resp 18

## 2021-10-07 DIAGNOSIS — D631 Anemia in chronic kidney disease: Secondary | ICD-10-CM | POA: Diagnosis present

## 2021-10-07 DIAGNOSIS — N189 Chronic kidney disease, unspecified: Secondary | ICD-10-CM | POA: Insufficient documentation

## 2021-10-07 LAB — FERRITIN: Ferritin: 111 ng/mL (ref 11–307)

## 2021-10-07 LAB — RENAL FUNCTION PANEL
Albumin: 3.5 g/dL (ref 3.5–5.0)
Anion gap: 8 (ref 5–15)
BUN: 53 mg/dL — ABNORMAL HIGH (ref 8–23)
CO2: 22 mmol/L (ref 22–32)
Calcium: 10.8 mg/dL — ABNORMAL HIGH (ref 8.9–10.3)
Chloride: 109 mmol/L (ref 98–111)
Creatinine, Ser: 4.68 mg/dL — ABNORMAL HIGH (ref 0.44–1.00)
GFR, Estimated: 10 mL/min — ABNORMAL LOW (ref >60)
Glucose, Bld: 94 mg/dL (ref 70–99)
Phosphorus: 4 mg/dL (ref 2.5–4.6)
Potassium: 4.2 mmol/L (ref 3.5–5.1)
Sodium: 139 mmol/L (ref 135–145)

## 2021-10-07 LAB — IRON AND TIBC
Iron: 76 ug/dL (ref 28–170)
Saturation Ratios: 30 % (ref 10.4–31.8)
TIBC: 258 ug/dL (ref 250–450)
UIBC: 182 ug/dL

## 2021-10-07 MED ORDER — EPOETIN ALFA-EPBX 10000 UNIT/ML IJ SOLN
20000.0000 [IU] | Freq: Once | INTRAMUSCULAR | Status: AC
  Administered 2021-10-07: 20000 [IU] via SUBCUTANEOUS

## 2021-10-07 MED ORDER — EPOETIN ALFA-EPBX 10000 UNIT/ML IJ SOLN
INTRAMUSCULAR | Status: AC
  Filled 2021-10-07: qty 2

## 2021-10-08 LAB — PTH, INTACT AND CALCIUM
Calcium, Total (PTH): 10.3 mg/dL (ref 8.7–10.3)
PTH: 418 pg/mL — ABNORMAL HIGH (ref 15–65)

## 2021-10-14 LAB — POCT HEMOGLOBIN-HEMACUE: Hemoglobin: 11.9 g/dL — ABNORMAL LOW (ref 12.0–15.0)

## 2021-10-21 DIAGNOSIS — M79604 Pain in right leg: Secondary | ICD-10-CM | POA: Diagnosis not present

## 2021-10-26 DIAGNOSIS — K219 Gastro-esophageal reflux disease without esophagitis: Secondary | ICD-10-CM | POA: Diagnosis not present

## 2021-10-26 DIAGNOSIS — G4733 Obstructive sleep apnea (adult) (pediatric): Secondary | ICD-10-CM | POA: Diagnosis not present

## 2021-10-26 DIAGNOSIS — N185 Chronic kidney disease, stage 5: Secondary | ICD-10-CM | POA: Diagnosis not present

## 2021-10-26 DIAGNOSIS — M810 Age-related osteoporosis without current pathological fracture: Secondary | ICD-10-CM | POA: Diagnosis not present

## 2021-10-26 DIAGNOSIS — I10 Essential (primary) hypertension: Secondary | ICD-10-CM | POA: Diagnosis not present

## 2021-10-26 DIAGNOSIS — E213 Hyperparathyroidism, unspecified: Secondary | ICD-10-CM | POA: Diagnosis not present

## 2021-10-26 DIAGNOSIS — M3210 Systemic lupus erythematosus, organ or system involvement unspecified: Secondary | ICD-10-CM | POA: Diagnosis not present

## 2021-10-29 ENCOUNTER — Encounter (HOSPITAL_COMMUNITY): Payer: Medicare HMO

## 2021-11-04 ENCOUNTER — Ambulatory Visit (HOSPITAL_COMMUNITY)
Admission: RE | Admit: 2021-11-04 | Discharge: 2021-11-04 | Disposition: A | Discharge status: Home / Self Care | Payer: Medicare HMO | Source: Ambulatory Visit | Attending: Nephrology | Admitting: Nephrology

## 2021-11-04 VITALS — BP 114/86 | HR 74 | Temp 97.5°F | Resp 18

## 2021-11-04 DIAGNOSIS — D631 Anemia in chronic kidney disease: Secondary | ICD-10-CM | POA: Diagnosis not present

## 2021-11-04 DIAGNOSIS — N189 Chronic kidney disease, unspecified: Secondary | ICD-10-CM | POA: Diagnosis not present

## 2021-11-04 LAB — IRON AND TIBC
Iron: 45 ug/dL (ref 28–170)
Saturation Ratios: 17 % (ref 10.4–31.8)
TIBC: 270 ug/dL (ref 250–450)
UIBC: 225 ug/dL

## 2021-11-04 LAB — RENAL FUNCTION PANEL
Albumin: 3.4 g/dL — ABNORMAL LOW (ref 3.5–5.0)
Anion gap: 7 (ref 5–15)
BUN: 69 mg/dL — ABNORMAL HIGH (ref 8–23)
CO2: 18 mmol/L — ABNORMAL LOW (ref 22–32)
Calcium: 8.1 mg/dL — ABNORMAL LOW (ref 8.9–10.3)
Chloride: 111 mmol/L (ref 98–111)
Creatinine, Ser: 3.96 mg/dL — ABNORMAL HIGH (ref 0.44–1.00)
GFR, Estimated: 12 mL/min — ABNORMAL LOW (ref >60)
Glucose, Bld: 94 mg/dL (ref 70–99)
Phosphorus: 3.8 mg/dL (ref 2.5–4.6)
Potassium: 3.6 mmol/L (ref 3.5–5.1)
Sodium: 136 mmol/L (ref 135–145)

## 2021-11-04 LAB — POCT HEMOGLOBIN-HEMACUE: Hemoglobin: 12.6 g/dL (ref 12.0–15.0)

## 2021-11-04 MED ORDER — EPOETIN ALFA-EPBX 10000 UNIT/ML IJ SOLN: 20000.0000 [IU] | Freq: Once | INTRAMUSCULAR | Status: DC

## 2021-11-09 DIAGNOSIS — G4733 Obstructive sleep apnea (adult) (pediatric): Secondary | ICD-10-CM | POA: Diagnosis not present

## 2021-11-10 DIAGNOSIS — Z7682 Awaiting organ transplant status: Secondary | ICD-10-CM | POA: Diagnosis not present

## 2021-11-11 ENCOUNTER — Ambulatory Visit (HOSPITAL_COMMUNITY)
Admission: RE | Admit: 2021-11-11 | Discharge: 2021-11-11 | Disposition: A | Discharge status: Home / Self Care | Payer: Medicare HMO | Source: Ambulatory Visit | Attending: Physician Assistant | Admitting: Physician Assistant

## 2021-11-11 ENCOUNTER — Other Ambulatory Visit (HOSPITAL_COMMUNITY): Payer: Self-pay | Admitting: Physician Assistant

## 2021-11-11 DIAGNOSIS — I10 Essential (primary) hypertension: Secondary | ICD-10-CM | POA: Diagnosis not present

## 2021-11-11 DIAGNOSIS — M79604 Pain in right leg: Secondary | ICD-10-CM | POA: Diagnosis not present

## 2021-11-11 DIAGNOSIS — M79661 Pain in right lower leg: Secondary | ICD-10-CM | POA: Insufficient documentation

## 2021-11-11 DIAGNOSIS — M7989 Other specified soft tissue disorders: Secondary | ICD-10-CM | POA: Insufficient documentation

## 2021-11-11 DIAGNOSIS — R600 Localized edema: Secondary | ICD-10-CM | POA: Diagnosis not present

## 2021-11-11 NOTE — Progress Notes (Signed)
VASCULAR LAB    Right lower extremity venous duplex has been performed.  See CV proc for preliminary results.  Attempted to call negative results, however, on hold for >7 minutes.     Daxx Tiggs, RVT 11/11/2021, 1:42 PM

## 2021-11-12 ENCOUNTER — Ambulatory Visit
Admission: RE | Admit: 2021-11-12 | Discharge: 2021-11-12 | Disposition: A | Discharge status: Home / Self Care | Payer: Medicare HMO | Source: Ambulatory Visit | Attending: Physician Assistant | Admitting: Physician Assistant

## 2021-11-12 ENCOUNTER — Other Ambulatory Visit: Payer: Self-pay | Admitting: Physician Assistant

## 2021-11-12 DIAGNOSIS — R52 Pain, unspecified: Secondary | ICD-10-CM

## 2021-11-12 DIAGNOSIS — M79661 Pain in right lower leg: Secondary | ICD-10-CM | POA: Diagnosis not present

## 2021-11-16 DIAGNOSIS — G4733 Obstructive sleep apnea (adult) (pediatric): Secondary | ICD-10-CM | POA: Diagnosis not present

## 2021-11-16 DIAGNOSIS — I10 Essential (primary) hypertension: Secondary | ICD-10-CM | POA: Diagnosis not present

## 2021-11-16 DIAGNOSIS — E669 Obesity, unspecified: Secondary | ICD-10-CM | POA: Diagnosis not present

## 2021-11-18 ENCOUNTER — Ambulatory Visit (HOSPITAL_COMMUNITY)
Admission: RE | Admit: 2021-11-18 | Discharge: 2021-11-18 | Disposition: A | Discharge status: Home / Self Care | Payer: Medicare HMO | Source: Ambulatory Visit | Attending: Nephrology | Admitting: Nephrology

## 2021-11-18 VITALS — BP 118/91 | HR 66 | Temp 97.8°F

## 2021-11-18 DIAGNOSIS — D631 Anemia in chronic kidney disease: Secondary | ICD-10-CM | POA: Insufficient documentation

## 2021-11-18 DIAGNOSIS — N189 Chronic kidney disease, unspecified: Secondary | ICD-10-CM | POA: Insufficient documentation

## 2021-11-18 LAB — POCT HEMOGLOBIN-HEMACUE: Hemoglobin: 11.8 g/dL — ABNORMAL LOW (ref 12.0–15.0)

## 2021-11-18 MED ORDER — EPOETIN ALFA-EPBX 10000 UNIT/ML IJ SOLN
INTRAMUSCULAR | Status: AC
  Administered 2021-11-18: 20000 [IU] via SUBCUTANEOUS
  Filled 2021-11-18: qty 2

## 2021-11-18 MED ORDER — EPOETIN ALFA-EPBX 10000 UNIT/ML IJ SOLN: 20000.0000 [IU] | Freq: Once | INTRAMUSCULAR | Status: AC

## 2021-11-24 ENCOUNTER — Ambulatory Visit: Payer: Medicare HMO | Admitting: Orthopaedic Surgery

## 2021-11-24 ENCOUNTER — Encounter: Payer: Self-pay | Admitting: Orthopaedic Surgery

## 2021-11-24 DIAGNOSIS — M79661 Pain in right lower leg: Secondary | ICD-10-CM

## 2021-11-24 MED ORDER — TRAMADOL HCL 50 MG PO TABS: 50.0000 mg | ORAL_TABLET | Freq: Every day | ORAL | 0 refills | Status: DC | PRN

## 2021-11-24 NOTE — Progress Notes (Signed)
Office Visit Note   Patient: Colleen Obrien           Date of Birth: 1955-03-01           MRN: [id number] Visit Date: 11/24/2021              Requested by: Carol Ada, [place],  [place] [id number] PCP: Carol Ada, MD   Assessment & Plan: Visit Diagnoses:  1. Pain in right lower leg     Plan: Impression is right lower leg pain.  Unclear exactly what is going on but it is concerning for an impending pathologic fracture based on difficulty weightbearing and radiographic findings.  Therefore we will need an MRI with contrast to assess for structural abnormalities.  Follow-up after the MRI.  She is to use the walker for ambulation at all times.  Sent a prescription for tramadol for breakthrough pain.  Follow-Up Instructions: No follow-ups on file.   Orders:  Orders Placed This Encounter  Procedures   MR TIBIA FIBULA RIGHT W CONTRAST   Meds ordered this encounter  Medications   DISCONTD: traMADol (ULTRAM) 50 MG tablet    Sig: Take 1-2 tablets (50-100 mg total) by mouth daily as needed.    Dispense:  20 tablet    Refill:  0   traMADol (ULTRAM) 50 MG tablet    Sig: Take 1-2 tablets (50-100 mg total) by mouth daily as needed.    Dispense:  20 tablet    Refill:  0      Procedures: No procedures performed   Clinical Data: No additional findings.   Subjective: Chief Complaint  Patient presents with   Right Leg - Pain    HPI [name] is a 66 year old female comes in for evaluation of chronic lower leg pain for a month.  She is to the point that she needs a walker for ambulation.  She localizes the pain to just below the knee.  She recently had ultrasound which was negative for DVT and Baker's cyst.  She had an x-ray of her tib-fib which showed a couple of areas of cortical irregularities on the medial metaphyseal cortex of the proximal tibia.  She denies any constitutional symptoms or injuries.  She is status post left hip  replacement and has done well from this.  She is pleased with the outcome in that regard.  Review of Systems  Constitutional: Negative.   HENT: Negative.    Eyes: Negative.   Respiratory: Negative.    Cardiovascular: Negative.   Endocrine: Negative.   Musculoskeletal: Negative.   Neurological: Negative.   Hematological: Negative.   Psychiatric/Behavioral: Negative.    All other systems reviewed and are negative.    Objective: Vital Signs: There were no vitals taken for this visit.  Physical Exam Vitals and nursing note reviewed.  Constitutional:      Appearance: She is well-developed.  HENT:     Head: Atraumatic.     Nose: Nose normal.  Eyes:     Extraocular Movements: Extraocular movements intact.  Cardiovascular:     Pulses: Normal pulses.  Pulmonary:     Effort: Pulmonary effort is normal.  Abdominal:     Palpations: Abdomen is soft.  Musculoskeletal:     Cervical back: Neck supple.  Skin:    General: Skin is warm.     Capillary Refill: Capillary refill takes less than 2 seconds.  Neurological:     Mental Status: She is alert. Mental status is  at baseline.  Psychiatric:        Behavior: Behavior normal.        Thought Content: Thought content normal.        Judgment: Judgment normal.     Ortho Exam Examination of the right lower leg shows generalized swelling to the proximal tibia.  Compartments are soft.  There is no joint line tenderness and no joint effusion.  Collaterals and cruciates are stable.  She has fluid painless range of motion of the knee.  She does have bony tenderness of the proximal tibia. Specialty Comments:  No specialty comments available.  Imaging: No results found.   PMFS History: Patient Active Problem List   Diagnosis Date Noted   Anemia of chronic renal failure 03/08/2021   Chronic renal disease, stage IV ([place]) 03/08/2021   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   AVN (avascular necrosis of bone) ([place]) 07/08/2019    Diverticulosis of colon without hemorrhage 07/08/2019   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   Tremor 03/02/2017   Shaking 08/23/2016   Excessive daytime sleepiness 08/23/2016   Obstructive sleep apnea 08/23/2016   Past Medical History:  Diagnosis Date   Anemia in chronic kidney disease (CKD)    Arthritis    Asthma    Blood dyscrasia    systemic lupus   Chronic kidney disease (CKD), active medical management without dialysis, stage 4 (severe) (HCC)    Dyspnea    GERD (gastroesophageal reflux disease)    Gout    Hypertension    Lupus ([place])    Narcolepsy    Pneumonia    Sleep apnea     Family History  Problem Relation Age of Onset   Hypertension Mother    Diabetes Mother    Dementia Father    Hypertension Father    Hyperlipidemia Father    Diabetes Paternal Grandmother    Alcohol abuse Paternal Grandfather     Past Surgical History:  Procedure Laterality Date   CESAREAN SECTION     TOTAL HIP ARTHROPLASTY Left 08/26/2019   Procedure: LEFT TOTAL HIP ARTHROPLASTY ANTERIOR APPROACH;  Surgeon: Leandrew Koyanagi, MD;  Location: [facility];  Service: Orthopedics;  Laterality: Left;   TUBAL LIGATION     Social History   Occupational History   Occupation: Public affairs consultant  Tobacco Use   Smoking status: Never   Smokeless tobacco: Never  Vaping Use   Vaping Use: Never used  Substance and Sexual Activity   Alcohol use: No   Drug use: No   Sexual activity: Not on file

## 2021-12-02 ENCOUNTER — Encounter (HOSPITAL_COMMUNITY): Payer: Medicare HMO

## 2021-12-09 ENCOUNTER — Other Ambulatory Visit: Payer: Self-pay

## 2021-12-09 DIAGNOSIS — M79661 Pain in right lower leg: Secondary | ICD-10-CM

## 2021-12-10 DIAGNOSIS — N185 Chronic kidney disease, stage 5: Secondary | ICD-10-CM | POA: Diagnosis not present

## 2021-12-10 DIAGNOSIS — N2581 Secondary hyperparathyroidism of renal origin: Secondary | ICD-10-CM | POA: Diagnosis not present

## 2021-12-10 DIAGNOSIS — I120 Hypertensive chronic kidney disease with stage 5 chronic kidney disease or end stage renal disease: Secondary | ICD-10-CM | POA: Diagnosis not present

## 2021-12-10 DIAGNOSIS — M3214 Glomerular disease in systemic lupus erythematosus: Secondary | ICD-10-CM | POA: Diagnosis not present

## 2021-12-10 DIAGNOSIS — M109 Gout, unspecified: Secondary | ICD-10-CM | POA: Diagnosis not present

## 2021-12-15 NOTE — Therapy (Addendum)
OUTPATIENT PHYSICAL THERAPY LOWER EXTREMITY EVALUATION /DISCHARGE   Patient Name: Colleen Obrien MRN: [id number] DOB:Apr 18, 1955, 66 y.o., female Today's Date: 12/17/2021   PT End of Session - 12/17/21 0847     Visit Number 1    Number of Visits 16    PT Start Time 3716    PT Stop Time 0930    PT Time Calculation (min) 43 min    Activity Tolerance Patient tolerated treatment well;No increased pain    Behavior During Therapy WFL for tasks assessed/performed             Past Medical History:  Diagnosis Date   Anemia in chronic kidney disease (CKD)    Arthritis    Asthma    Blood dyscrasia    systemic lupus   Chronic kidney disease (CKD), active medical management without dialysis, stage 4 (severe) (HCC)    Dyspnea    GERD (gastroesophageal reflux disease)    Gout    Hypertension    Lupus ([place])    Narcolepsy    Pneumonia    Sleep apnea    Past Surgical History:  Procedure Laterality Date   CESAREAN SECTION     TOTAL HIP ARTHROPLASTY Left 08/26/2019   Procedure: LEFT TOTAL HIP ARTHROPLASTY ANTERIOR APPROACH;  Surgeon: Leandrew Koyanagi, MD;  Location: [facility];  Service: Orthopedics;  Laterality: Left;   TUBAL LIGATION     Patient Active Problem List   Diagnosis Date Noted   Anemia of chronic renal failure 03/08/2021   Chronic renal disease, stage IV ([place]) 03/08/2021   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   AVN (avascular necrosis of bone) ([place]) 07/08/2019   Diverticulosis of colon without hemorrhage 07/08/2019   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   Tremor 03/02/2017   Shaking 08/23/2016   Excessive daytime sleepiness 08/23/2016   Obstructive sleep apnea 08/23/2016    PCP: Carol Ada, MD  REFERRING PROVIDER: Leandrew Koyanagi, MD  REFERRING DIAG:  Diagnosis  [phone] (ICD-10-CM) - Pain in right lower leg    THERAPY DIAG:  Difficulty in walking, not elsewhere classified  Muscle weakness (generalized)  Pain in right lower  leg  Rationale for Evaluation and Treatment: Rehabilitation  ONSET DATE: October 29, 2021  SUBJECTIVE:   SUBJECTIVE STATEMENT: Colleen Obrien notes she "stumbled" on 10/29/21.  "Something hit my leg later that night" and symptoms have not improved since that time, possibly getting worse.  Occasional "electrical shocks" in the lateral 2 toes, occasional paresthesias of right calf, posterior knee and just above the posterior knee.  PERTINENT HISTORY: Chronic kidney disease, HTN, lupus, Lt THA PAIN:  Are you having pain? Yes: NPRS scale: 0-10/10 Pain location: Mostly right calf and shin Pain description: Can be very intense when walking and standing, throbbing Aggravating factors: Standing and walking Relieving factors: Better lying down  PRECAUTIONS: None  WEIGHT BEARING RESTRICTIONS:  None officially but symptoms get worse with WB  FALLS:  Has patient fallen in last 6 months? 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  NEXT MD VISIT: MRI Monday 12/20/2021, MD appointment after that  OBJECTIVE:   DIAGNOSTIC FINDINGS: IMPRESSION: 1. No acute fracture or malalignment. 2. 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  PATIENT SURVEYS:  FOTO 21 (Goal 52 in 16 visits)  COGNITION: Overall cognitive status: Within functional limits for tasks assessed     SENSATION: Colleen Obrien notes "electrical shocks" can be present in the lateral 2 toes on the right side  EDEMA:  Noted but not objectively assessed  PALPATION: Mild pitting edema of the right calf, ankle and foot  LOWER EXTREMITY ROM:  Active ROM Right eval Left eval  Hip flexion    Hip  extension    Hip abduction    Hip adduction    Hip internal rotation    Hip external rotation    Knee flexion    Knee extension    Ankle dorsiflexion 12 15  Ankle plantarflexion    Ankle inversion    Ankle eversion     (Blank rows = not tested)  LOWER EXTREMITY STRENGTH (hand-held dynamometer):  Strength in pounds Right eval Left eval  Hip flexion    Hip extension    Hip abduction    Hip adduction    Hip internal rotation    Hip external rotation    Knee flexion    Knee extension    Ankle dorsiflexion    Ankle plantarflexion    Ankle inversion 8.4 15.7  Ankle eversion 3.1 19.3    (Blank rows = not tested)   GAIT: Distance walked: In the clinic Assistive device utilized: Walker - 2 wheeled Level of assistance: Complete Independence Comments: Slow with the walker.  Was AD free before injury.  Was using a cane a few weeks ago.   TODAY'S TREATMENT:                                                                                                                              DATE: 12/17/2021 Quadriceps sets with ankle dorsiflexion 10X 5 seconds Ankle Inversion & Eversion isometrics seated with the ball 10X each side for 5 seconds Seated heel to toe raises 10X for 3 seconds    PATIENT EDUCATION:  Education details: Reviwe exam findings and HEP Person educated: Patient Education method: Consulting civil engineer, Demonstration, Tactile cues, Verbal cues, and Handouts Education comprehension: verbalized understanding, returned demonstration, verbal cues required, tactile cues required, and needs further education  HOME EXERCISE PROGRAM: Access Code: [email or web address] URL: [email or web address] Date: 12/17/2021 Prepared by: Vista Mink  Exercises - Supine Quadricep Sets  - 3-5 x daily - 7 x weekly - 1 sets - 10 reps - 5 second hold - Isometric Ankle Inversion at Wall  - 3-5 x daily - 7 x weekly - 1 sets - 10 reps - 5 hold - Isometric Ankle Eversion at Wall  - 3-5 x daily -  7 x weekly - 1 sets - 10 reps - 5 hold - Seated Heel Toe Raises  - 5-10 x daily - 7 x weekly - 1 sets - 10 reps - 3 hold  ASSESSMENT:  CLINICAL IMPRESSION: Patient is a 66 y.o. female  who was seen today for physical therapy evaluation and treatment for  Diagnosis  M79.661 (ICD-10-CM) - Pain in right lower leg  .  Colleen Obrien appears to be getting worse as she had to go from no assistive device pre-injury to a cane and now a walker.  Edema is obvious below the right knee.  Blood clot has been ruled out by ultrasound according to [facility].  AROM is not bad but is edema limited.  Strength is poor.  She was started on an appropriate program to reduce edema, improve strength and we will reassess after Thanksgiving after we get the results of her 12/20/2021 scheduled MRI.  OBJECTIVE IMPAIRMENTS: Abnormal gait, decreased activity tolerance, decreased balance, decreased endurance, decreased mobility, difficulty walking, decreased strength, increased edema, and pain.   ACTIVITY LIMITATIONS: carrying, lifting, bending, sitting, standing, squatting, stairs, and locomotion level  PARTICIPATION LIMITATIONS: driving and community activity  PERSONAL FACTORS: Chronic kidney disease, HTN, lupus, Lt THA are also affecting patient's functional outcome.   REHAB POTENTIAL: Good  CLINICAL DECISION MAKING: Stable/uncomplicated  EVALUATION COMPLEXITY: Low   GOALS: Goals reviewed with patient? Yes  SHORT TERM GOALS: Target date: 01/14/2022  Improve right ankle strength for inversion and eversion to 25+ pounds Baseline: 3-8 pounds Goal status: INITIAL  2.  Colleen Obrien will be able to function without her walker with either a cane or no assistive device, at least within the home Baseline: Walker full-time Goal status: INITIAL  3.  Colleen Obrien will be independent with her starter HEP by week 4 of PT Baseline: Starter program prescribed 12/17/2021 Goal status: INITIAL   LONG TERM GOALS: Target date: 02/11/2022    Improve FOTO to 52 Baseline: 21 Goal status: INITIAL  2.  Colleen Obrien will report right leg pain consistently 0-2/10 on the VAS Baseline: Can be 10/10 Goal status: INITIAL  3.  Improve right ankle dorsiflexion AROM to 15 degrees Baseline: 12 degrees Goal status: INITIAL  4.  Improve right ankle strength for inversion and eversion to at least 30 pounds and Colleen Obrien will be able to do a standing heel raise on the right Baseline: 3-8 pounds and unable to do a heel raise Goal status: INITIAL  5.  Colleen Obrien will be AD free at DC Baseline: Full-time walker Goal status: INITIAL  6.  Colleen Obrien will be independent with her long-term HEP at DC Baseline: Started 12/17/2021 Goal status: INITIAL   PLAN:  PT FREQUENCY: 1-2x/week  PT DURATION: 8 weeks  PLANNED INTERVENTIONS: Therapeutic exercises, Therapeutic activity, Neuromuscular re-education, Balance training, Gait training, Patient/Family education, Self Care, Stair training, Cryotherapy, and Vasopneumatic device  PLAN FOR NEXT SESSION: Review MRI findings and starter HEP with progressions as appropriate.  Strength, edema control emphasis with progressions to balance and function as appropriate.   Farley Ly, PT, MPT 12/17/2021, 10:50 AM     PHYSICAL THERAPY DISCHARGE SUMMARY  Visits from Start of Care: 1  Current functional level related to goals / functional outcomes: See note   Remaining deficits: See note   Education / Equipment: HEP  Patient goals were not met. Patient is being discharged due to not returning since the last visit.  Scot Jun, PT, DPT, OCS, ATC 02/24/22  2:04 PM

## 2021-12-16 ENCOUNTER — Ambulatory Visit (HOSPITAL_COMMUNITY)
Admission: RE | Admit: 2021-12-16 | Discharge: 2021-12-16 | Disposition: A | Discharge status: Home / Self Care | Payer: Medicare HMO | Source: Ambulatory Visit | Attending: Nephrology | Admitting: Nephrology

## 2021-12-16 VITALS — BP 128/92 | HR 67 | Temp 98.0°F | Resp 17

## 2021-12-16 DIAGNOSIS — N189 Chronic kidney disease, unspecified: Secondary | ICD-10-CM | POA: Diagnosis not present

## 2021-12-16 DIAGNOSIS — N184 Chronic kidney disease, stage 4 (severe): Secondary | ICD-10-CM | POA: Diagnosis not present

## 2021-12-16 DIAGNOSIS — D631 Anemia in chronic kidney disease: Secondary | ICD-10-CM | POA: Insufficient documentation

## 2021-12-16 LAB — RENAL FUNCTION PANEL
Albumin: 3.4 g/dL — ABNORMAL LOW (ref 3.5–5.0)
Anion gap: 8 (ref 5–15)
BUN: 51 mg/dL — ABNORMAL HIGH (ref 8–23)
CO2: 18 mmol/L — ABNORMAL LOW (ref 22–32)
Calcium: 9.3 mg/dL (ref 8.9–10.3)
Chloride: 110 mmol/L (ref 98–111)
Creatinine, Ser: 4.06 mg/dL — ABNORMAL HIGH (ref 0.44–1.00)
GFR, Estimated: 12 mL/min — ABNORMAL LOW (ref >60)
Glucose, Bld: 86 mg/dL (ref 70–99)
Phosphorus: 4.1 mg/dL (ref 2.5–4.6)
Potassium: 4.3 mmol/L (ref 3.5–5.1)
Sodium: 136 mmol/L (ref 135–145)

## 2021-12-16 LAB — IRON AND TIBC
Iron: 67 ug/dL (ref 28–170)
Saturation Ratios: 27 % (ref 10.4–31.8)
TIBC: 249 ug/dL — ABNORMAL LOW (ref 250–450)
UIBC: 182 ug/dL

## 2021-12-16 MED ORDER — EPOETIN ALFA-EPBX 10000 UNIT/ML IJ SOLN
20000.0000 [IU] | Freq: Once | INTRAMUSCULAR | Status: AC
  Administered 2021-12-16: 20000 [IU] via SUBCUTANEOUS

## 2021-12-16 MED ORDER — EPOETIN ALFA-EPBX 10000 UNIT/ML IJ SOLN
INTRAMUSCULAR | Status: AC
  Filled 2021-12-16: qty 2

## 2021-12-17 ENCOUNTER — Encounter: Payer: Self-pay | Admitting: Rehabilitative and Restorative Service Providers"

## 2021-12-17 ENCOUNTER — Ambulatory Visit: Payer: Medicare HMO | Admitting: Rehabilitative and Restorative Service Providers"

## 2021-12-17 DIAGNOSIS — M6281 Muscle weakness (generalized): Secondary | ICD-10-CM

## 2021-12-17 DIAGNOSIS — R262 Difficulty in walking, not elsewhere classified: Secondary | ICD-10-CM | POA: Diagnosis not present

## 2021-12-17 DIAGNOSIS — M79661 Pain in right lower leg: Secondary | ICD-10-CM | POA: Diagnosis not present

## 2021-12-17 LAB — POCT HEMOGLOBIN-HEMACUE: Hemoglobin: 11.6 g/dL — ABNORMAL LOW (ref 12.0–15.0)

## 2021-12-21 ENCOUNTER — Telehealth: Payer: Self-pay | Admitting: Orthopaedic Surgery

## 2021-12-21 ENCOUNTER — Ambulatory Visit
Admission: RE | Admit: 2021-12-21 | Discharge: 2021-12-21 | Disposition: A | Discharge status: Home / Self Care | Payer: Medicare HMO | Source: Ambulatory Visit | Attending: Orthopaedic Surgery | Admitting: Orthopaedic Surgery

## 2021-12-21 DIAGNOSIS — M79661 Pain in right lower leg: Secondary | ICD-10-CM

## 2021-12-21 DIAGNOSIS — R600 Localized edema: Secondary | ICD-10-CM | POA: Diagnosis not present

## 2021-12-21 MED ORDER — GADOPICLENOL 0.5 MMOL/ML IV SOLN
9.0000 mL | Freq: Once | INTRAVENOUS | Status: AC | PRN
  Administered 2021-12-21: 9 mL via INTRAVENOUS

## 2021-12-21 NOTE — Telephone Encounter (Signed)
Pt called in stating that she would like to advise Dr. Erlinda Hong that she have a scheduled appointment for today for MRI... Pt stated that she didn't have to wait the 6 weeks.Marland KitchenMarland Kitchen

## 2021-12-21 NOTE — Telephone Encounter (Signed)
thanks

## 2021-12-28 ENCOUNTER — Ambulatory Visit: Payer: Medicare HMO | Admitting: Orthopaedic Surgery

## 2021-12-28 DIAGNOSIS — E669 Obesity, unspecified: Secondary | ICD-10-CM | POA: Diagnosis not present

## 2021-12-28 DIAGNOSIS — E559 Vitamin D deficiency, unspecified: Secondary | ICD-10-CM | POA: Diagnosis not present

## 2021-12-28 DIAGNOSIS — G4733 Obstructive sleep apnea (adult) (pediatric): Secondary | ICD-10-CM | POA: Diagnosis not present

## 2021-12-28 DIAGNOSIS — M79661 Pain in right lower leg: Secondary | ICD-10-CM

## 2021-12-28 DIAGNOSIS — I10 Essential (primary) hypertension: Secondary | ICD-10-CM | POA: Diagnosis not present

## 2021-12-28 NOTE — Progress Notes (Signed)
Office Visit Note   Patient: Colleen Obrien           Date of Birth: 01-22-1956           MRN: [id number] Visit Date: 12/28/2021              Requested by: Carol Ada, [address],  [place] [address] PCP: Carol Ada, MD   Assessment & Plan: Visit Diagnoses:  1. Pain in right lower leg     Plan: The MRI is consistent with a nondisplaced stress fracture of the proximal tibial metadiaphysis.  There is surrounding marrow edema.  There is muscle edema in the compartments.  These findings were reviewed with her.  I think a fair portion of her pain is from the edema so I will prescribe compression socks.  She is to keep this elevated as much as she can.  She can weight-bear as tolerated with a rolling walker.  She did have a bone density test earlier this year which showed osteoporosis.  We will obtain vitamin D and calcium levels.  I would like to recheck her in 6 weeks.  If her symptoms worsen she needs to follow-up sooner than that.  Follow-Up Instructions: Return in about 6 weeks (around 02/08/2022).   Orders:  Orders Placed This Encounter  Procedures   Vitamin D (25 hydroxy)   Calcium   No orders of the defined types were placed in this encounter.     Procedures: No procedures performed   Clinical Data: No additional findings.   Subjective: Chief Complaint  Patient presents with   Right Leg - Follow-up    HPI [name] returns today for her review of recent MRI scan.  Review of Systems   Objective: Vital Signs: There were no vitals taken for this visit.  Physical Exam  Ortho Exam Ambulates with a rolling walker.  She has significant swelling and 2+ pitting edema in the lower extremity. Specialty Comments:  No specialty comments available.  Imaging: No results found.   PMFS History: Patient Active Problem List   Diagnosis Date Noted   Anemia of chronic renal failure 03/08/2021   Chronic renal disease, stage IV  ([place]) 03/08/2021   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   AVN (avascular necrosis of bone) ([place]) 07/08/2019   Diverticulosis of colon without hemorrhage 07/08/2019   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   Tremor 03/02/2017   Shaking 08/23/2016   Excessive daytime sleepiness 08/23/2016   Obstructive sleep apnea 08/23/2016   Past Medical History:  Diagnosis Date   Anemia in chronic kidney disease (CKD)    Arthritis    Asthma    Blood dyscrasia    systemic lupus   Chronic kidney disease (CKD), active medical management without dialysis, stage 4 (severe) (HCC)    Dyspnea    GERD (gastroesophageal reflux disease)    Gout    Hypertension    Lupus ([place])    Narcolepsy    Pneumonia    Sleep apnea     Family History  Problem Relation Age of Onset   Hypertension Mother    Diabetes Mother    Dementia Father    Hypertension Father    Hyperlipidemia Father    Diabetes Paternal Grandmother    Alcohol abuse Paternal Grandfather     Past Surgical History:  Procedure Laterality Date   CESAREAN SECTION     TOTAL HIP ARTHROPLASTY Left 08/26/2019   Procedure: LEFT TOTAL HIP  ARTHROPLASTY ANTERIOR APPROACH;  Surgeon: Leandrew Koyanagi, MD;  Location: [facility];  Service: Orthopedics;  Laterality: Left;   TUBAL LIGATION     Social History   Occupational History   Occupation: Public affairs consultant  Tobacco Use   Smoking status: Never   Smokeless tobacco: Never  Vaping Use   Vaping Use: Never used  Substance and Sexual Activity   Alcohol use: No   Drug use: No   Sexual activity: Not on file

## 2021-12-29 LAB — VITAMIN D 25 HYDROXY (VIT D DEFICIENCY, FRACTURES): Vit D, 25-Hydroxy: 19 ng/mL — ABNORMAL LOW (ref 30–100)

## 2021-12-29 LAB — CALCIUM: Calcium: 9.8 mg/dL (ref 8.6–10.4)

## 2021-12-29 MED ORDER — VITAMIN D (ERGOCALCIFEROL) 1.25 MG (50000 UNIT) PO CAPS: 50000.0000 [IU] | ORAL_CAPSULE | ORAL | 6 refills | Status: DC

## 2021-12-29 NOTE — Addendum Note (Signed)
Addended by: Azucena Cecil on: 12/29/2021 05:58 AM   Modules accepted: Orders

## 2022-01-05 ENCOUNTER — Ambulatory Visit: Payer: Medicare HMO | Admitting: Orthopaedic Surgery

## 2022-01-13 ENCOUNTER — Encounter (HOSPITAL_COMMUNITY): Payer: Medicare HMO

## 2022-01-13 DIAGNOSIS — M1A09X1 Idiopathic chronic gout, multiple sites, with tophus (tophi): Secondary | ICD-10-CM | POA: Diagnosis not present

## 2022-01-13 DIAGNOSIS — Z79899 Other long term (current) drug therapy: Secondary | ICD-10-CM | POA: Diagnosis not present

## 2022-01-13 DIAGNOSIS — M3214 Glomerular disease in systemic lupus erythematosus: Secondary | ICD-10-CM | POA: Diagnosis not present

## 2022-01-13 DIAGNOSIS — M329 Systemic lupus erythematosus, unspecified: Secondary | ICD-10-CM | POA: Diagnosis not present

## 2022-01-13 DIAGNOSIS — M810 Age-related osteoporosis without current pathological fracture: Secondary | ICD-10-CM | POA: Diagnosis not present

## 2022-01-13 DIAGNOSIS — G4733 Obstructive sleep apnea (adult) (pediatric): Secondary | ICD-10-CM | POA: Diagnosis not present

## 2022-01-13 DIAGNOSIS — I272 Pulmonary hypertension, unspecified: Secondary | ICD-10-CM | POA: Diagnosis not present

## 2022-01-13 DIAGNOSIS — M3119 Other thrombotic microangiopathy: Secondary | ICD-10-CM | POA: Diagnosis not present

## 2022-01-13 DIAGNOSIS — M1A9XX0 Chronic gout, unspecified, without tophus (tophi): Secondary | ICD-10-CM | POA: Diagnosis not present

## 2022-01-13 DIAGNOSIS — L309 Dermatitis, unspecified: Secondary | ICD-10-CM | POA: Diagnosis not present

## 2022-01-13 DIAGNOSIS — Z7185 Encounter for immunization safety counseling: Secondary | ICD-10-CM | POA: Diagnosis not present

## 2022-01-13 DIAGNOSIS — Z23 Encounter for immunization: Secondary | ICD-10-CM | POA: Diagnosis not present

## 2022-01-13 DIAGNOSIS — N189 Chronic kidney disease, unspecified: Secondary | ICD-10-CM | POA: Diagnosis not present

## 2022-01-17 ENCOUNTER — Ambulatory Visit (HOSPITAL_COMMUNITY)
Admission: RE | Admit: 2022-01-17 | Discharge: 2022-01-17 | Disposition: A | Discharge status: Home / Self Care | Payer: Medicare HMO | Source: Ambulatory Visit | Attending: Nephrology | Admitting: Nephrology

## 2022-01-17 VITALS — BP 161/108 | HR 68 | Temp 97.3°F | Resp 17

## 2022-01-17 DIAGNOSIS — N189 Chronic kidney disease, unspecified: Secondary | ICD-10-CM | POA: Diagnosis present

## 2022-01-17 DIAGNOSIS — D631 Anemia in chronic kidney disease: Secondary | ICD-10-CM | POA: Insufficient documentation

## 2022-01-17 LAB — RENAL FUNCTION PANEL
Albumin: 3.1 g/dL — ABNORMAL LOW (ref 3.5–5.0)
Anion gap: 10 (ref 5–15)
BUN: 48 mg/dL — ABNORMAL HIGH (ref 8–23)
CO2: 19 mmol/L — ABNORMAL LOW (ref 22–32)
Calcium: 9.9 mg/dL (ref 8.9–10.3)
Chloride: 108 mmol/L (ref 98–111)
Creatinine, Ser: 4.02 mg/dL — ABNORMAL HIGH (ref 0.44–1.00)
GFR, Estimated: 12 mL/min — ABNORMAL LOW (ref >60)
Glucose, Bld: 99 mg/dL (ref 70–99)
Phosphorus: 3.6 mg/dL (ref 2.5–4.6)
Potassium: 4.3 mmol/L (ref 3.5–5.1)
Sodium: 137 mmol/L (ref 135–145)

## 2022-01-17 LAB — FERRITIN: Ferritin: 56 ng/mL (ref 11–307)

## 2022-01-17 LAB — IRON AND TIBC
Iron: 56 ug/dL (ref 28–170)
Saturation Ratios: 23 % (ref 10.4–31.8)
TIBC: 245 ug/dL — ABNORMAL LOW (ref 250–450)
UIBC: 189 ug/dL

## 2022-01-17 MED ORDER — EPOETIN ALFA-EPBX 10000 UNIT/ML IJ SOLN
20000.0000 [IU] | Freq: Once | INTRAMUSCULAR | Status: AC
  Administered 2022-01-17: 20000 [IU] via SUBCUTANEOUS

## 2022-01-17 MED ORDER — EPOETIN ALFA-EPBX 10000 UNIT/ML IJ SOLN
INTRAMUSCULAR | Status: AC
  Filled 2022-01-17: qty 2

## 2022-01-18 LAB — PTH, INTACT AND CALCIUM
Calcium, Total (PTH): 9.9 mg/dL (ref 8.7–10.3)
PTH: 808 pg/mL — ABNORMAL HIGH (ref 15–65)

## 2022-01-18 LAB — POCT HEMOGLOBIN-HEMACUE: Hemoglobin: 10.8 g/dL — ABNORMAL LOW (ref 12.0–15.0)

## 2022-01-27 DIAGNOSIS — R399 Unspecified symptoms and signs involving the genitourinary system: Secondary | ICD-10-CM | POA: Diagnosis not present

## 2022-02-07 ENCOUNTER — Encounter (HOSPITAL_COMMUNITY): Payer: Self-pay

## 2022-02-10 ENCOUNTER — Encounter (HOSPITAL_COMMUNITY): Payer: Medicare HMO

## 2022-02-13 ENCOUNTER — Encounter (HOSPITAL_COMMUNITY): Payer: Self-pay

## 2022-02-14 ENCOUNTER — Ambulatory Visit (HOSPITAL_COMMUNITY)
Admission: RE | Admit: 2022-02-14 | Discharge: 2022-02-14 | Disposition: A | Discharge status: Home / Self Care | Payer: Medicare PPO | Source: Ambulatory Visit | Attending: Nephrology | Admitting: Nephrology

## 2022-02-14 ENCOUNTER — Encounter (HOSPITAL_COMMUNITY): Payer: Self-pay

## 2022-02-14 VITALS — BP 138/92 | HR 65 | Temp 97.4°F | Resp 17

## 2022-02-14 DIAGNOSIS — D631 Anemia in chronic kidney disease: Secondary | ICD-10-CM | POA: Diagnosis present

## 2022-02-14 DIAGNOSIS — N189 Chronic kidney disease, unspecified: Secondary | ICD-10-CM | POA: Insufficient documentation

## 2022-02-14 LAB — RENAL FUNCTION PANEL
Albumin: 3.3 g/dL — ABNORMAL LOW (ref 3.5–5.0)
Anion gap: 7 (ref 5–15)
BUN: 55 mg/dL — ABNORMAL HIGH (ref 8–23)
CO2: 18 mmol/L — ABNORMAL LOW (ref 22–32)
Calcium: 10.5 mg/dL — ABNORMAL HIGH (ref 8.9–10.3)
Chloride: 109 mmol/L (ref 98–111)
Creatinine, Ser: 4.27 mg/dL — ABNORMAL HIGH (ref 0.44–1.00)
GFR, Estimated: 11 mL/min — ABNORMAL LOW (ref >60)
Glucose, Bld: 86 mg/dL (ref 70–99)
Phosphorus: 4.4 mg/dL (ref 2.5–4.6)
Potassium: 5 mmol/L (ref 3.5–5.1)
Sodium: 134 mmol/L — ABNORMAL LOW (ref 135–145)

## 2022-02-14 LAB — IRON AND TIBC
Iron: 53 ug/dL (ref 28–170)
Saturation Ratios: 19 % (ref 10.4–31.8)
TIBC: 287 ug/dL (ref 250–450)
UIBC: 234 ug/dL

## 2022-02-14 LAB — POCT HEMOGLOBIN-HEMACUE: Hemoglobin: 10.8 g/dL — ABNORMAL LOW (ref 12.0–15.0)

## 2022-02-14 MED ORDER — EPOETIN ALFA-EPBX 10000 UNIT/ML IJ SOLN
INTRAMUSCULAR | Status: AC
  Filled 2022-02-14: qty 2

## 2022-02-14 MED ORDER — EPOETIN ALFA-EPBX 10000 UNIT/ML IJ SOLN
20000.0000 [IU] | Freq: Once | INTRAMUSCULAR | Status: AC
  Administered 2022-02-14: 20000 [IU] via SUBCUTANEOUS

## 2022-03-14 ENCOUNTER — Ambulatory Visit (HOSPITAL_COMMUNITY)
Admission: RE | Admit: 2022-03-14 | Discharge: 2022-03-14 | Disposition: A | Discharge status: Home / Self Care | Payer: Medicare PPO | Source: Ambulatory Visit | Attending: Nephrology | Admitting: Nephrology

## 2022-03-14 VITALS — BP 163/101 | HR 68 | Temp 97.2°F | Resp 19

## 2022-03-14 DIAGNOSIS — N189 Chronic kidney disease, unspecified: Secondary | ICD-10-CM | POA: Insufficient documentation

## 2022-03-14 DIAGNOSIS — D631 Anemia in chronic kidney disease: Secondary | ICD-10-CM | POA: Insufficient documentation

## 2022-03-14 LAB — IRON AND TIBC
Iron: 47 ug/dL (ref 28–170)
Saturation Ratios: 17 % (ref 10.4–31.8)
TIBC: 280 ug/dL (ref 250–450)
UIBC: 233 ug/dL

## 2022-03-14 LAB — POCT HEMOGLOBIN-HEMACUE: Hemoglobin: 10.1 g/dL — ABNORMAL LOW (ref 12.0–15.0)

## 2022-03-14 MED ORDER — EPOETIN ALFA-EPBX 10000 UNIT/ML IJ SOLN
INTRAMUSCULAR | Status: AC
  Filled 2022-03-14: qty 2

## 2022-03-14 MED ORDER — EPOETIN ALFA-EPBX 10000 UNIT/ML IJ SOLN
20000.0000 [IU] | Freq: Once | INTRAMUSCULAR | Status: AC
  Administered 2022-03-14: 20000 [IU] via SUBCUTANEOUS

## 2022-04-01 ENCOUNTER — Ambulatory Visit (INDEPENDENT_AMBULATORY_CARE_PROVIDER_SITE_OTHER): Payer: Medicare PPO

## 2022-04-01 ENCOUNTER — Ambulatory Visit (INDEPENDENT_AMBULATORY_CARE_PROVIDER_SITE_OTHER): Payer: Medicare PPO | Admitting: Orthopaedic Surgery

## 2022-04-01 DIAGNOSIS — M79662 Pain in left lower leg: Secondary | ICD-10-CM | POA: Diagnosis not present

## 2022-04-01 DIAGNOSIS — M79661 Pain in right lower leg: Secondary | ICD-10-CM

## 2022-04-01 NOTE — Progress Notes (Signed)
Office Visit Note   Patient: Colleen Obrien           Date of Birth: 04-06-1955           MRN: [id number] Visit Date: 04/01/2022              Requested by: Carol Ada, [place] [place],  [place] [address] PCP: Carol Ada, MD   Assessment & Plan: Visit Diagnoses:  1. Pain in right lower leg     Plan: Impression is nonunion right proximal tibia fracture.  Unfortunately, the patient stress fracture has worsened and now has a complete fracture with a nonunion and varus deformity.  At this point, we are going to make an urgent referral to Dr. Latanya Maudlin orthopedic traumatologist for further evaluation and treatment recommendation.  In the meantime she will be as nonambulatory as possible.  Call with concerns or questions.  Follow-Up Instructions: Return if symptoms worsen or fail to improve.   Orders:  Orders Placed This Encounter  Procedures   XR Tibia/Fibula Right   XR Tibia/Fibula Left   Ambulatory referral to Orthopedic Surgery   No orders of the defined types were placed in this encounter.     Procedures: No procedures performed   Clinical Data: No additional findings.   Subjective: Chief Complaint  Patient presents with   Right Leg - Follow-up    HPI patient is a pleasant 67 year old female who comes in today for follow-up of her left leg proximal tibia stress fracture.  Initial pain started approximately 5 months ago.  She has been ambulating with a walker and has been using compression socks for the swelling.  She notes that her pain and swelling have both decreased.  She still has some discomfort with walking long distances.  She is on Prolia for her osteoporosis and has been on vitamin D as well.  Sees endocrinologist currently.    Review of Systems as detailed in HPI.  All others reviewed and are negative.   Objective: Vital Signs: There were no vitals taken for this visit.  Physical Exam well-developed  well-nourished female no acute distress.  Alert and oriented x 3.  Ortho Exam right knee exam shows moderate swelling throughout the right leg.  Mild to moderate tenderness to the medial aspect of proximal tibia.  She is neurovascular intact distally.  Specialty Comments:  No specialty comments available.  Imaging: XR Tibia/Fibula Left  Result Date: 04/01/2022 No acute or structural abnormalities  XR Tibia/Fibula Right  Result Date: 04/01/2022 X-rays demonstrate nonunion of the fracture of the proximal tibial metaphysis in varus.      PMFS History: Patient Active Problem List   Diagnosis Date Noted   Anemia of chronic renal failure 03/08/2021   Chronic renal disease, stage IV ([place]) 03/08/2021   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   AVN (avascular necrosis of bone) ([place]) 07/08/2019   Diverticulosis of colon without hemorrhage 07/08/2019   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   Tremor 03/02/2017   Shaking 08/23/2016   Excessive daytime sleepiness 08/23/2016   Obstructive sleep apnea 08/23/2016   Past Medical History:  Diagnosis Date   Anemia in chronic kidney disease (CKD)    Arthritis    Asthma    Blood dyscrasia    systemic lupus   Chronic kidney disease (CKD), active medical management without dialysis, stage 4 (severe) (HCC)    Dyspnea    GERD (gastroesophageal reflux disease)    Gout  Hypertension    Lupus ([place])    Narcolepsy    Pneumonia    Sleep apnea     Family History  Problem Relation Age of Onset   Hypertension Mother    Diabetes Mother    Dementia Father    Hypertension Father    Hyperlipidemia Father    Diabetes Paternal Grandmother    Alcohol abuse Paternal Grandfather     Past Surgical History:  Procedure Laterality Date   CESAREAN SECTION     TOTAL HIP ARTHROPLASTY Left 08/26/2019   Procedure: LEFT TOTAL HIP ARTHROPLASTY ANTERIOR APPROACH;  Surgeon: Leandrew Koyanagi, MD;  Location: [facility];  Service: Orthopedics;   Laterality: Left;   TUBAL LIGATION     Social History   Occupational History   Occupation: Public affairs consultant  Tobacco Use   Smoking status: Never   Smokeless tobacco: Never  Vaping Use   Vaping Use: Never used  Substance and Sexual Activity   Alcohol use: No   Drug use: No   Sexual activity: Not on file

## 2022-04-11 ENCOUNTER — Encounter (HOSPITAL_COMMUNITY): Payer: Medicare PPO

## 2022-04-12 ENCOUNTER — Ambulatory Visit (HOSPITAL_COMMUNITY)
Admission: RE | Admit: 2022-04-12 | Discharge: 2022-04-12 | Disposition: A | Discharge status: Home / Self Care | Payer: Medicare PPO | Source: Ambulatory Visit | Attending: Nephrology | Admitting: Nephrology

## 2022-04-12 VITALS — BP 128/96 | HR 64 | Temp 97.4°F | Resp 17

## 2022-04-12 DIAGNOSIS — N189 Chronic kidney disease, unspecified: Secondary | ICD-10-CM | POA: Insufficient documentation

## 2022-04-12 DIAGNOSIS — D631 Anemia in chronic kidney disease: Secondary | ICD-10-CM | POA: Diagnosis present

## 2022-04-12 LAB — POCT HEMOGLOBIN-HEMACUE: Hemoglobin: 10.2 g/dL — ABNORMAL LOW (ref 12.0–15.0)

## 2022-04-12 LAB — IRON AND TIBC
Iron: 52 ug/dL (ref 28–170)
Saturation Ratios: 17 % (ref 10.4–31.8)
TIBC: 311 ug/dL (ref 250–450)
UIBC: 259 ug/dL

## 2022-04-12 LAB — FERRITIN: Ferritin: 25 ng/mL (ref 11–307)

## 2022-04-12 LAB — URIC ACID: Uric Acid, Serum: 6.9 mg/dL (ref 2.5–7.1)

## 2022-04-12 MED ORDER — EPOETIN ALFA-EPBX 10000 UNIT/ML IJ SOLN
INTRAMUSCULAR | Status: AC
  Administered 2022-04-12: 20000 [IU] via SUBCUTANEOUS
  Filled 2022-04-12: qty 2

## 2022-04-12 MED ORDER — EPOETIN ALFA-EPBX 10000 UNIT/ML IJ SOLN: 20000.0000 [IU] | INTRAMUSCULAR | Status: DC

## 2022-04-29 ENCOUNTER — Encounter (HOSPITAL_COMMUNITY): Payer: Self-pay | Admitting: Orthopedic Surgery

## 2022-04-29 NOTE — Progress Notes (Signed)
PCP - Dr Carol Ada Cardiologist - n/a Rheumatology - Dr Rosanna Randy  Nephrology - Dr Pearson Grippe  Chest x-ray - 07/20/21 CE EKG - DOS Stress Test - 05/21/21 ECHO - 05/21/21 Cardiac Cath - n/a  ICD Pacemaker/Loop - n/a  Sleep Study -  Yes, 02/02/17 CPAP - tries to use CPAP nightly   Diabetes - n/a  ERAS: Clear liquids til 7:45 AM DOS  Anesthesia review: Yes  STOP now taking any Aspirin (unless otherwise instructed by your surgeon), Aleve, Naproxen, Ibuprofen, Motrin, Advil, Goody's, BC's, all herbal medications, fish oil, and all vitamins.   Coronavirus Screening Do you have any of the following symptoms:  Cough occasional cough in the mornings Fever (>100.67F)  yes/no: No Runny nose yes/no: No Sore throat yes/no: No Difficulty breathing/shortness of breath  occasional   Have you traveled in the last 14 days and where? yes/no: No  Patient verbalized understanding of instructions that were given via phone.

## 2022-04-29 NOTE — Progress Notes (Addendum)
Anesthesia Chart Review: Same day workup  67 year old female with history of lupus, HTN, CKD (lupus nephritis), pulmonary hypertension, OSA, TTP, and gout.  Follows with rheumatology at [facility] for management of SLE and gout.  She is currently maintained on colchicine 0.6 mg daily and Plaquenil 200 mg daily.  CKD4 followed by nephrologist Dr. Joelyn Oms.  Most recent creatinine 3.73 on 04/28/2022, appears to be near baseline.  Had stress echo 05/21/2021 as part of yearly transplant evaluation, test was unremarkable, full results below.  Patient will need day of surgery labs and evaluation.   Stress echo 05/21/2021 (Care Everywhere): NORMAL STRESS TEST. NORMAL RESTING STUDY WITH NO WALL MOTION ABNORMALITIES   AT REST AND PEAK STRESS.   NORMAL LA PRESSURES WITH NORMAL DIASTOLIC FUNCTION   VALVULAR REGURGITATION: MILD TR   NO VALVULAR STENOSIS   Maximum workload of  4.60 METs was achieved during exercise.   RESTING HYPERTENSION - APPROPRIATE RESPONSE   TRIVIAL PERICARDIAL EFFUSION     Wynonia Musty [facility] Short Stay Center/Anesthesiology Phone [phone] 04/29/2022 10:21 AM

## 2022-04-29 NOTE — Anesthesia Preprocedure Evaluation (Signed)
Anesthesia Evaluation  Patient identified by MRN, date of birth, ID band Patient awake    Reviewed: Allergy & Precautions, NPO status , Patient's Chart, lab work & pertinent test results  Airway Mallampati: I  TM Distance: >3 FB Neck ROM: Full    Dental no notable dental hx. (+) Teeth Intact, Dental Advisory Given   Pulmonary asthma , sleep apnea and Continuous Positive Airway Pressure Ventilation    Pulmonary exam normal breath sounds clear to auscultation       Cardiovascular hypertension, pulmonary hypertensionNormal cardiovascular exam Rhythm:Regular Rate:Normal     Neuro/Psych    GI/Hepatic ,GERD  ,,  Endo/Other    Renal/GU Renal Insufficiency and CRFRenal diseaseOn transplant list     Musculoskeletal  (+) Arthritis ,  SLE   Abdominal  (+) + obese (BMI 35.48)  Peds  Hematology  (+) Blood dyscrasia, anemia Lab Results      Component                Value               Date                            HGB                      10.2 (L)            04/12/2022                 Anesthesia Other Findings All: erythromycin, sulfa, levaquin Wheelchair dependent  Reproductive/Obstetrics                             Anesthesia Physical Anesthesia Plan  ASA: 3  Anesthesia Plan: General   Post-op Pain Management: Tylenol PO (pre-op)* and Ketamine IV*   Induction: Intravenous  PONV Risk Score and Plan: 4 or greater and Treatment may vary due to age or medical condition, Ondansetron and Midazolam  Airway Management Planned: Oral ETT  Additional Equipment: None  Intra-op Plan:   Post-operative Plan: Extubation in OR  Informed Consent: I have reviewed the patients History and Physical, chart, labs and discussed the procedure including the risks, benefits and alternatives for the proposed anesthesia with the patient or authorized representative who has indicated his/her understanding and  acceptance.     Dental advisory given  Plan Discussed with: CRNA, Anesthesiologist and Surgeon  Anesthesia Plan Comments: (PAT note by Karoline Caldwell, PA-C: 67 year old female with history of lupus, HTN, CKD (lupus nephritis), pulmonary hypertension, OSA, TTP, and gout.  Follows with rheumatology at [facility] for management of SLE and gout.  She is currently maintained on colchicine 0.6 mg daily and Plaquenil 200 mg daily.  CKD4 followed by nephrologist Dr. Joelyn Oms.  Most recent creatinine 3.73 on 04/28/2022, appears to be near baseline.  Had stress echo 05/21/2021 as part of yearly transplant evaluation, test was unremarkable, full results below.  Patient will need day of surgery labs and evaluation.   Stress echo 05/21/2021 (Care Everywhere): NORMAL STRESS TEST. NORMAL RESTING STUDY WITH NO WALL MOTION ABNORMALITIES  AT REST AND PEAK STRESS.  NORMAL LA PRESSURES WITH NORMAL DIASTOLIC FUNCTION  VALVULAR REGURGITATION: MILD TR  NO VALVULAR STENOSIS  Maximumworkload of 4.60 METs was achieved during exercise.  RESTING HYPERTENSION - APPROPRIATE RESPONSE  TRIVIAL PERICARDIAL EFFUSION    )  Anesthesia Quick Evaluation  

## 2022-05-02 NOTE — H&P (Signed)
Orthopaedic Trauma Service (OTS) Consult   Patient ID: WYNEE NUDD MRN: [id number] DOB/AGE: 1955-07-22 66 y.o.  Patient seen and evaluated on 04/25/2022 in the office where full consultation was performed  HPI: Colleen Obrien is an 67 y.o. female with history notable for stage IV CKD, on renal transplant list at [facility], who presented to our office at the request of Dr. Erlinda Hong for the evaluation of right proximal tibia nonunion.  Patient states that she was really not all that certain when her injury occurred however there were 2 possible instances where it could have been incited.  First was back in September when she started exercising more and then secondly she sustained a stumble without an actual fall but did result in some right leg pain the following evening.  Patient ultimately had an x-ray performed on 11/12/2021 which initially showed some cortical lucency around the proximal medial tibia on the right side.  She was referred to Dr. Erlinda Hong who saw her on 11/24/2021.  MRI was ordered which showed an insufficiency/stress fracture of the right medial proximal tibial metadiaphysis.  Incidentally the MRI also noted an incomplete stress reaction on the left medial cortex as well in the same vicinity but not nearly to the agrees the right side.  Nonoperative management was continued.  She followed up with Dr. Erlinda Hong on 12/28/2021 where they review the findings of the MRI.  It was recommended that she continue to weight-bear as tolerated with use of rolling walker.  Vitamin D levels were obtained which did demonstrate vitamin D deficiency with a level of 19 ng/mL however her picture is complicated given her stage IV renal disease as she has concomitant hyperparathyroidism with her most recent PTH level at 808 pg/mL.  Again patient is on the transplant list.  Subsequent she followed up with Dr. 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  As such patient was referred to orthopedic trauma service for further evaluation.  Patient was seen and evaluated on 04/25/2022.  She is currently in a wheelchair but does use a walker on occasion.  Depending on the day she is able to put some weight through her right leg which still has significant pain in her right leg.  She does not have much pain in her left leg but does get uncomfortable at times as she has been bearing most of her weight through that side since this whole incident started.  She is on Prolia for osteoporosis and sees an endocrinologist for this.  Endocrinology note was reviewed and is extraordinarily thorough with respect to her osteoporosis and pathologic fracture.  Given her renal disease treatment options are limited in terms of the use of bisphosphonate and PTH analogs which are pretty much contraindicated.  She has been on Prolia previously but stopped for couple of years and as such this has been restarted.  They did also discuss of starting Evenity but again this can only be used for a year and then she would have to return to Prolia after treatment course is been completed.  I do think that if she fails to heal this fracture after surgery then strong consideration should be made for a trial of Evenity.  Additional risk factors were reviewed including a long-term medication and chronic diseases  including SLE, Plaquenil use for CKD.  They also discussed the possibility of adynamic bone disease but labs do not point in this direction.  Patient does report tingling along the right peroneal nerve distribution  We discussed all of these findings in the office as well as treatment options.  She definitely has exhausted all nonoperative management interventions and I do think that her kidney disease is really played a significant impact  on her inability to heal this fracture.  We feel that surgery is in her best interest to get her back to baseline.  After extensive discussion she is in agreement with this.  We did discuss briefly with her renal doctor Dr. Joelyn Oms as to whether or not there were any concerns that he would have preoperatively and all he indicated was to avoid anti-inflammatory medications.  Past Medical History:  Diagnosis Date   Anemia in chronic kidney disease (CKD)    Arthritis    Asthma    Blood dyscrasia    systemic lupus   Chronic kidney disease (CKD), active medical management without dialysis, stage 4 (severe) (HCC)    Dyspnea    GERD (gastroesophageal reflux disease)    Gout    Hypertension    Lupus (HCC)    Narcolepsy    Pneumonia    x 1   Sleep apnea    tries to use CPAP nightly as of 04/29/22   Walker as Health and safety inspector dependent    when going outside home    Past Surgical History:  Procedure Laterality Date   CESAREAN SECTION     x 2   COLONOSCOPY     ganglion cyst removed Right    arm   TOTAL HIP ARTHROPLASTY Left 08/26/2019   Procedure: LEFT TOTAL HIP ARTHROPLASTY ANTERIOR APPROACH;  Surgeon: Leandrew Koyanagi, MD;  Location: [facility];  Service: Orthopedics;  Laterality: Left;   TUBAL LIGATION      Family History  Problem Relation Age of Onset   Hypertension Mother    Diabetes Mother    Dementia Father    Hypertension Father    Hyperlipidemia Father    Diabetes Paternal Grandmother    Alcohol abuse Paternal Grandfather     Social History:  reports that she has never smoked. She has never used smokeless tobacco. She reports that she does not drink alcohol and does not use drugs.  Allergies:  Allergies  Allergen Reactions   Erythromycin Nausea And Vomiting   Levofloxacin     Causes muscle pain    Sulfa Antibiotics Hives    Medications: I have reviewed the patient's current medications. Current Outpatient Medications  Medication Instructions    acetaminophen (TYLENOL) 500 mg, Oral, Every 6 hours PRN   acetaminophen-codeine (TYLENOL #3) 300-30 MG tablet 1 tablet, Oral, Every 8 hours PRN   albuterol (PROVENTIL HFA;VENTOLIN HFA) 108 (90 BASE) MCG/ACT inhaler 2 puffs, Inhalation, Every 6 hours PRN   allopurinol (ZYLOPRIM) 150 mg, Oral, Daily   budesonide-formoterol (SYMBICORT) 80-4.5 MCG/ACT inhaler 2 puffs, Inhalation, 2 times daily PRN   clobetasol ointment (TEMOVATE) AB-123456789 % 1 Application, Topical, 2 times daily PRN   colchicine 0.6 mg, Oral, Every other day   denosumab (PROLIA) 60 mg, Subcutaneous, Every 6 months, Administer in upper arm, thigh, or abdomen   felodipine (PLENDIL) 10 mg, Oral, Daily   furosemide (LASIX) 40 mg, Oral, Daily PRN   hydroxychloroquine (PLAQUENIL) 200 mg, Oral, Daily   labetalol (NORMODYNE) 300 mg, Oral, 2 times daily  triamcinolone ointment (KENALOG) 0.1 % 1 Application, Topical, 2 times daily PRN   Vitamin D (Ergocalciferol) (DRISDOL) 50,000 Units, Oral, Every 7 days     No results found for this or any previous visit (from the past 48 hour(s)).  No results found.  Intake/Output    None      Review of Systems  Constitutional:  Negative for chills and fever.  Respiratory:  Negative for shortness of breath and wheezing.   Cardiovascular:  Negative for chest pain and palpitations.  Gastrointestinal:  Negative for nausea and vomiting.  Musculoskeletal:        Right leg pain  Neurological:  Positive for tingling (Tingling along the right peroneal nerve).   Height 5\' 2"  (1.575 m), weight 88 kg. Physical Exam Constitutional:      General: She is not in acute distress.    Appearance: Normal appearance. She is obese.  HENT:     Head: Normocephalic and atraumatic.  Eyes:     Extraocular Movements: Extraocular movements intact.  Cardiovascular:     Rate and Rhythm: Normal rate and regular rhythm.  Pulmonary:     Effort: Pulmonary effort is normal.  Musculoskeletal:     Comments: Right lower  extremity No significant swelling noted to the right leg. Varus deformity right proximal tibia Tenderness to right proximal tibia No gross motion or instability noted with varus or valgus stressing of the lower leg Mild decrease sensation on the superficial peroneal nerve distribution but otherwise sensation is grossly intact along all other distributions + DP pulses noted Extremity is warm Excellent knee and ankle range of motion are noted Compartments are soft and nontender No pain out of proportion with passive stretching of toes or ankle No pain with axial loading or logrolling of her hip EHL, FHL, lesser toe motor functions intact ankle flexion extension inversion eversion intact Patient can perform active quad set as well as knee flexion Patient can perform active knee extension  Left lower extremity No acute findings noted Noted is firm palpation of the proximal tibia Motor and sensory functions are at baseline Symmetric pulses noted Full range of motion is noted knee and ankles   Skin:    General: Skin is warm.     Capillary Refill: Capillary refill takes less than 2 seconds.  Neurological:     General: No focal deficit present.     Mental Status: She is alert and oriented to person, place, and time.  Psychiatric:        Mood and Affect: Mood normal.        Behavior: Behavior normal.        Thought Content: Thought content normal.        Judgment: Judgment normal.     Xrays  2 view right tibia demonstrates stable appearance of the right tibia nonunion.  No further shifts and alignment are noted from previous films.  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  Given her renal disease she lacks sufficient biology to overcome some degree of instability at the fracture site to heal this with out surgical intervention.  We discussed continued nonoperative versus surgical intervention and feel that surgical intervention is warranted to get her back to her baseline status.   Patient is in agreement the plan and wishes to proceed  Plan for intramedullary nailing with possible blocking screw   Will also attempt to obtain a bone growth stimulator to serve as an adjuvant in the healing process   Continue Prolia as per endocrinologist recommendations   Patient will be weightbearing as tolerated postoperatively with unrestricted range of motion of her knee and ankle   Outpatient surgery versus overnight observation.  We will see how she is doing in the PACU after surgery  - Pain management:  Multimodal.  No NSAIDs  - Medical issues   Stage IV kidney disease  SLE  Osteoporosis  - DVT/PE prophylaxis:  Will place on DVT prophylaxis postoperatively  Will double check with nephrologist and pharmacist but will likely use Eliquis at prophylactic dosing which should be okay.  Would plan to do for 30 days  - ID:   Periop abx  Renally dosed   - Metabolic Bone Disease:  Due to CKD - Activity:  Weight-bear as tolerated right leg postoperatively - FEN/GI prophylaxis/Foley/Lines:  NPO  Resume diet post op   - Impediments to fracture healing:  Established nonunion  Osteoporosis  Stage IV CKD  Chronic medications including Lasix  - Dispo:  OR for repair of right proximal tibia nonunion     Jari Pigg, PA-C [phone] (C) 05/02/2022, 3:00 PM  [facility] [place] [id number] [phone] Jenetta [phone] (F)    After 5pm and on the weekends  please log on to Amion, go to orthopaedics and the look under the [facility] Call for the provider(s) on call. You can also call our office at [phone] and then follow the prompts to be connected to the call team.

## 2022-05-03 ENCOUNTER — Encounter (HOSPITAL_COMMUNITY): Payer: Self-pay | Admitting: Orthopedic Surgery

## 2022-05-03 ENCOUNTER — Other Ambulatory Visit: Payer: Self-pay

## 2022-05-03 ENCOUNTER — Ambulatory Visit (HOSPITAL_COMMUNITY): Payer: Medicare PPO

## 2022-05-03 ENCOUNTER — Other Ambulatory Visit (HOSPITAL_COMMUNITY): Payer: Self-pay

## 2022-05-03 ENCOUNTER — Encounter (HOSPITAL_COMMUNITY)
Admission: RE | Disposition: A | Discharge status: Home / Self Care | Payer: Self-pay | Source: Home / Self Care | Attending: Orthopedic Surgery

## 2022-05-03 ENCOUNTER — Ambulatory Visit (HOSPITAL_COMMUNITY)
Admission: RE | Admit: 2022-05-03 | Discharge: 2022-05-05 | Disposition: A | Discharge status: Home / Self Care | Payer: Medicare PPO | Attending: Orthopedic Surgery | Admitting: Orthopedic Surgery

## 2022-05-03 ENCOUNTER — Ambulatory Visit (HOSPITAL_BASED_OUTPATIENT_CLINIC_OR_DEPARTMENT_OTHER): Payer: Medicare PPO | Admitting: Physician Assistant

## 2022-05-03 ENCOUNTER — Encounter (HOSPITAL_COMMUNITY): Payer: Self-pay

## 2022-05-03 ENCOUNTER — Ambulatory Visit (HOSPITAL_COMMUNITY): Payer: Medicare PPO | Admitting: Physician Assistant

## 2022-05-03 DIAGNOSIS — M84361A Stress fracture, right tibia, initial encounter for fracture: Secondary | ICD-10-CM | POA: Insufficient documentation

## 2022-05-03 DIAGNOSIS — M1990 Unspecified osteoarthritis, unspecified site: Secondary | ICD-10-CM | POA: Diagnosis not present

## 2022-05-03 DIAGNOSIS — S82291K Other fracture of shaft of right tibia, subsequent encounter for closed fracture with nonunion: Secondary | ICD-10-CM

## 2022-05-03 DIAGNOSIS — N189 Chronic kidney disease, unspecified: Secondary | ICD-10-CM

## 2022-05-03 DIAGNOSIS — Z01818 Encounter for other preprocedural examination: Secondary | ICD-10-CM

## 2022-05-03 DIAGNOSIS — N184 Chronic kidney disease, stage 4 (severe): Secondary | ICD-10-CM | POA: Diagnosis present

## 2022-05-03 DIAGNOSIS — Z79899 Other long term (current) drug therapy: Secondary | ICD-10-CM | POA: Insufficient documentation

## 2022-05-03 DIAGNOSIS — I129 Hypertensive chronic kidney disease with stage 1 through stage 4 chronic kidney disease, or unspecified chronic kidney disease: Secondary | ICD-10-CM

## 2022-05-03 DIAGNOSIS — M109 Gout, unspecified: Secondary | ICD-10-CM | POA: Diagnosis not present

## 2022-05-03 DIAGNOSIS — D631 Anemia in chronic kidney disease: Secondary | ICD-10-CM | POA: Diagnosis not present

## 2022-05-03 DIAGNOSIS — M3214 Glomerular disease in systemic lupus erythematosus: Secondary | ICD-10-CM | POA: Insufficient documentation

## 2022-05-03 DIAGNOSIS — Z7983 Long term (current) use of bisphosphonates: Secondary | ICD-10-CM | POA: Insufficient documentation

## 2022-05-03 DIAGNOSIS — Z7682 Awaiting organ transplant status: Secondary | ICD-10-CM | POA: Insufficient documentation

## 2022-05-03 DIAGNOSIS — N2581 Secondary hyperparathyroidism of renal origin: Secondary | ICD-10-CM | POA: Insufficient documentation

## 2022-05-03 DIAGNOSIS — Z993 Dependence on wheelchair: Secondary | ICD-10-CM | POA: Diagnosis not present

## 2022-05-03 DIAGNOSIS — S82191K Other fracture of upper end of right tibia, subsequent encounter for closed fracture with nonunion: Secondary | ICD-10-CM

## 2022-05-03 DIAGNOSIS — I272 Pulmonary hypertension, unspecified: Secondary | ICD-10-CM | POA: Diagnosis not present

## 2022-05-03 DIAGNOSIS — G4733 Obstructive sleep apnea (adult) (pediatric): Secondary | ICD-10-CM | POA: Diagnosis present

## 2022-05-03 DIAGNOSIS — M329 Systemic lupus erythematosus, unspecified: Secondary | ICD-10-CM | POA: Insufficient documentation

## 2022-05-03 DIAGNOSIS — E559 Vitamin D deficiency, unspecified: Secondary | ICD-10-CM | POA: Insufficient documentation

## 2022-05-03 DIAGNOSIS — S82101K Unspecified fracture of upper end of right tibia, subsequent encounter for closed fracture with nonunion: Secondary | ICD-10-CM

## 2022-05-03 DIAGNOSIS — G4730 Sleep apnea, unspecified: Secondary | ICD-10-CM | POA: Diagnosis not present

## 2022-05-03 DIAGNOSIS — S82201K Unspecified fracture of shaft of right tibia, subsequent encounter for closed fracture with nonunion: Secondary | ICD-10-CM | POA: Diagnosis present

## 2022-05-03 DIAGNOSIS — M810 Age-related osteoporosis without current pathological fracture: Secondary | ICD-10-CM | POA: Insufficient documentation

## 2022-05-03 DIAGNOSIS — G47411 Narcolepsy with cataplexy: Secondary | ICD-10-CM | POA: Diagnosis present

## 2022-05-03 HISTORY — DX: Dependence on wheelchair: Z99.3

## 2022-05-03 HISTORY — DX: Dependence on other enabling machines and devices: Z99.89

## 2022-05-03 HISTORY — PX: TIBIA IM NAIL INSERTION: SHX2516

## 2022-05-03 LAB — COMPREHENSIVE METABOLIC PANEL
ALT: 13 U/L (ref 0–44)
AST: 15 U/L (ref 15–41)
Albumin: 3.4 g/dL — ABNORMAL LOW (ref 3.5–5.0)
Alkaline Phosphatase: 73 U/L (ref 38–126)
Anion gap: 12 (ref 5–15)
BUN: 48 mg/dL — ABNORMAL HIGH (ref 8–23)
CO2: 13 mmol/L — ABNORMAL LOW (ref 22–32)
Calcium: 8.4 mg/dL — ABNORMAL LOW (ref 8.9–10.3)
Chloride: 112 mmol/L — ABNORMAL HIGH (ref 98–111)
Creatinine, Ser: 4 mg/dL — ABNORMAL HIGH (ref 0.44–1.00)
GFR, Estimated: 12 mL/min — ABNORMAL LOW (ref >60)
Glucose, Bld: 88 mg/dL (ref 70–99)
Potassium: 4.8 mmol/L (ref 3.5–5.1)
Sodium: 137 mmol/L (ref 135–145)
Total Bilirubin: 0.6 mg/dL (ref 0.3–1.2)
Total Protein: 6.8 g/dL (ref 6.5–8.1)

## 2022-05-03 LAB — CBC WITH DIFFERENTIAL/PLATELET
Abs Immature Granulocytes: 0.02 10*3/uL (ref 0.00–0.07)
Basophils Absolute: 0 10*3/uL (ref 0.0–0.1)
Basophils Relative: 1 %
Eosinophils Absolute: 0.4 10*3/uL (ref 0.0–0.5)
Eosinophils Relative: 8 %
HCT: 31.9 % — ABNORMAL LOW (ref 36.0–46.0)
Hemoglobin: 9.7 g/dL — ABNORMAL LOW (ref 12.0–15.0)
Immature Granulocytes: 0 %
Lymphocytes Relative: 15 %
Lymphs Abs: 0.8 10*3/uL (ref 0.7–4.0)
MCH: 27.4 pg (ref 26.0–34.0)
MCHC: 30.4 g/dL (ref 30.0–36.0)
MCV: 90.1 fL (ref 80.0–100.0)
Monocytes Absolute: 0.5 10*3/uL (ref 0.1–1.0)
Monocytes Relative: 9 %
Neutro Abs: 3.5 10*3/uL (ref 1.7–7.7)
Neutrophils Relative %: 67 %
Platelets: 194 10*3/uL (ref 150–400)
RBC: 3.54 MIL/uL — ABNORMAL LOW (ref 3.87–5.11)
RDW: 16.6 % — ABNORMAL HIGH (ref 11.5–15.5)
WBC: 5.2 10*3/uL (ref 4.0–10.5)
nRBC: 0 % (ref 0.0–0.2)

## 2022-05-03 LAB — VITAMIN D 25 HYDROXY (VIT D DEFICIENCY, FRACTURES): Vit D, 25-Hydroxy: 47.49 ng/mL (ref 30–100)

## 2022-05-03 SURGERY — INSERTION, INTRAMEDULLARY ROD, TIBIA
Anesthesia: General | Laterality: Right

## 2022-05-03 MED ORDER — METHOCARBAMOL 1000 MG/10ML IJ SOLN
500.0000 mg | Freq: Three times a day (TID) | INTRAVENOUS | Status: DC
  Filled 2022-05-03: qty 5

## 2022-05-03 MED ORDER — MORPHINE SULFATE (PF) 2 MG/ML IV SOLN: 0.5000 mg | INTRAVENOUS | Status: DC | PRN

## 2022-05-03 MED ORDER — ROCURONIUM BROMIDE 10 MG/ML (PF) SYRINGE
PREFILLED_SYRINGE | INTRAVENOUS | Status: AC
  Filled 2022-05-03: qty 10

## 2022-05-03 MED ORDER — KETAMINE HCL 10 MG/ML IJ SOLN
INTRAMUSCULAR | Status: DC | PRN
  Administered 2022-05-03 (×2): 10 mg via INTRAVENOUS

## 2022-05-03 MED ORDER — FENTANYL CITRATE (PF) 250 MCG/5ML IJ SOLN
INTRAMUSCULAR | Status: DC | PRN
  Administered 2022-05-03: 25 ug via INTRAVENOUS
  Administered 2022-05-03: 100 ug via INTRAVENOUS
  Administered 2022-05-03: 25 ug via INTRAVENOUS
  Administered 2022-05-03: 50 ug via INTRAVENOUS
  Administered 2022-05-03: 25 ug via INTRAVENOUS

## 2022-05-03 MED ORDER — ACETAMINOPHEN 325 MG PO TABS: 325.0000 mg | ORAL_TABLET | Freq: Four times a day (QID) | ORAL | Status: DC | PRN

## 2022-05-03 MED ORDER — PHENYLEPHRINE 80 MCG/ML (10ML) SYRINGE FOR IV PUSH (FOR BLOOD PRESSURE SUPPORT)
PREFILLED_SYRINGE | INTRAVENOUS | Status: AC
  Filled 2022-05-03: qty 10

## 2022-05-03 MED ORDER — PHENYLEPHRINE HCL (PRESSORS) 10 MG/ML IV SOLN
INTRAVENOUS | Status: AC
  Filled 2022-05-03: qty 1

## 2022-05-03 MED ORDER — ONDANSETRON HCL 4 MG/2ML IJ SOLN: 4.0000 mg | Freq: Four times a day (QID) | INTRAMUSCULAR | Status: DC | PRN

## 2022-05-03 MED ORDER — LACTATED RINGERS IV SOLN: INTRAVENOUS | Status: DC

## 2022-05-03 MED ORDER — APIXABAN 2.5 MG PO TABS
2.5000 mg | ORAL_TABLET | Freq: Two times a day (BID) | ORAL | Status: DC
  Administered 2022-05-04 – 2022-05-05 (×3): 2.5 mg via ORAL
  Filled 2022-05-03 (×3): qty 1

## 2022-05-03 MED ORDER — COLCHICINE 0.6 MG PO TABS
0.6000 mg | ORAL_TABLET | ORAL | Status: DC
  Administered 2022-05-04: 0.6 mg via ORAL
  Filled 2022-05-03: qty 1

## 2022-05-03 MED ORDER — HYDROCODONE-ACETAMINOPHEN 5-325 MG PO TABS
1.0000 | ORAL_TABLET | ORAL | Status: DC | PRN
  Administered 2022-05-03 (×2): 2 via ORAL
  Administered 2022-05-04: 1 via ORAL
  Filled 2022-05-03 (×3): qty 2

## 2022-05-03 MED ORDER — DEXAMETHASONE SODIUM PHOSPHATE 10 MG/ML IJ SOLN
INTRAMUSCULAR | Status: AC
  Filled 2022-05-03: qty 1

## 2022-05-03 MED ORDER — ALBUTEROL SULFATE (2.5 MG/3ML) 0.083% IN NEBU: 2.5000 mg | INHALATION_SOLUTION | Freq: Four times a day (QID) | RESPIRATORY_TRACT | Status: DC | PRN

## 2022-05-03 MED ORDER — DOCUSATE SODIUM 100 MG PO CAPS
100.0000 mg | ORAL_CAPSULE | Freq: Two times a day (BID) | ORAL | Status: DC
  Administered 2022-05-03 – 2022-05-05 (×5): 100 mg via ORAL
  Filled 2022-05-03 (×5): qty 1

## 2022-05-03 MED ORDER — MIDAZOLAM HCL 2 MG/2ML IJ SOLN
INTRAMUSCULAR | Status: AC
  Filled 2022-05-03: qty 2

## 2022-05-03 MED ORDER — HYDROXYCHLOROQUINE SULFATE 200 MG PO TABS
200.0000 mg | ORAL_TABLET | Freq: Every day | ORAL | Status: DC
  Administered 2022-05-03 – 2022-05-05 (×3): 200 mg via ORAL
  Filled 2022-05-03 (×3): qty 1

## 2022-05-03 MED ORDER — ONDANSETRON HCL 4 MG PO TABS: 4.0000 mg | ORAL_TABLET | Freq: Four times a day (QID) | ORAL | Status: DC | PRN

## 2022-05-03 MED ORDER — SUGAMMADEX SODIUM 200 MG/2ML IV SOLN
INTRAVENOUS | Status: DC | PRN
  Administered 2022-05-03: 350 mg via INTRAVENOUS

## 2022-05-03 MED ORDER — METOCLOPRAMIDE HCL 5 MG/ML IJ SOLN
5.0000 mg | Freq: Three times a day (TID) | INTRAMUSCULAR | Status: DC | PRN
  Administered 2022-05-03: 10 mg via INTRAVENOUS
  Filled 2022-05-03: qty 2

## 2022-05-03 MED ORDER — ONDANSETRON HCL 4 MG/2ML IJ SOLN
INTRAMUSCULAR | Status: DC | PRN
  Administered 2022-05-03: 4 mg via INTRAVENOUS

## 2022-05-03 MED ORDER — 0.9 % SODIUM CHLORIDE (POUR BTL) OPTIME
TOPICAL | Status: DC | PRN
  Administered 2022-05-03: 1000 mL

## 2022-05-03 MED ORDER — PROPOFOL 10 MG/ML IV BOLUS
INTRAVENOUS | Status: AC
  Filled 2022-05-03: qty 20

## 2022-05-03 MED ORDER — PHENYLEPHRINE HCL-NACL 20-0.9 MG/250ML-% IV SOLN
INTRAVENOUS | Status: DC | PRN
  Administered 2022-05-03: 25 ug/min via INTRAVENOUS

## 2022-05-03 MED ORDER — METHOCARBAMOL 500 MG PO TABS
500.0000 mg | ORAL_TABLET | Freq: Three times a day (TID) | ORAL | Status: DC
  Administered 2022-05-03 – 2022-05-05 (×6): 500 mg via ORAL
  Filled 2022-05-03 (×6): qty 1

## 2022-05-03 MED ORDER — ORAL CARE MOUTH RINSE: 15.0000 mL | Freq: Once | OROMUCOSAL | Status: AC

## 2022-05-03 MED ORDER — FENTANYL CITRATE (PF) 100 MCG/2ML IJ SOLN
25.0000 ug | INTRAMUSCULAR | Status: DC | PRN
  Administered 2022-05-03 (×3): 25 ug via INTRAVENOUS

## 2022-05-03 MED ORDER — PROPOFOL 10 MG/ML IV BOLUS
INTRAVENOUS | Status: DC | PRN
  Administered 2022-05-03: 110 mg via INTRAVENOUS

## 2022-05-03 MED ORDER — ONDANSETRON HCL 4 MG/2ML IJ SOLN
INTRAMUSCULAR | Status: AC
  Filled 2022-05-03: qty 2

## 2022-05-03 MED ORDER — LIDOCAINE 2% (20 MG/ML) 5 ML SYRINGE
INTRAMUSCULAR | Status: AC
  Filled 2022-05-03: qty 5

## 2022-05-03 MED ORDER — SODIUM CHLORIDE 0.9 % IV SOLN: INTRAVENOUS | Status: DC

## 2022-05-03 MED ORDER — LIDOCAINE 2% (20 MG/ML) 5 ML SYRINGE
INTRAMUSCULAR | Status: DC | PRN
  Administered 2022-05-03: 100 mg via INTRAVENOUS

## 2022-05-03 MED ORDER — MOMETASONE FURO-FORMOTEROL FUM 100-5 MCG/ACT IN AERO
2.0000 | INHALATION_SPRAY | Freq: Two times a day (BID) | RESPIRATORY_TRACT | Status: DC
  Administered 2022-05-04 – 2022-05-05 (×3): 2 via RESPIRATORY_TRACT
  Filled 2022-05-03: qty 8.8

## 2022-05-03 MED ORDER — HYDROCODONE-ACETAMINOPHEN 7.5-325 MG PO TABS
1.0000 | ORAL_TABLET | ORAL | Status: DC | PRN
  Administered 2022-05-04 – 2022-05-05 (×3): 1 via ORAL
  Filled 2022-05-03 (×3): qty 1

## 2022-05-03 MED ORDER — FELODIPINE ER 5 MG PO TB24
10.0000 mg | ORAL_TABLET | Freq: Every day | ORAL | Status: DC
  Administered 2022-05-03 – 2022-05-05 (×3): 10 mg via ORAL
  Filled 2022-05-03 (×4): qty 2

## 2022-05-03 MED ORDER — ALLOPURINOL 300 MG PO TABS
150.0000 mg | ORAL_TABLET | Freq: Every day | ORAL | Status: DC
  Administered 2022-05-03 – 2022-05-05 (×3): 150 mg via ORAL
  Filled 2022-05-03 (×3): qty 1

## 2022-05-03 MED ORDER — FENTANYL CITRATE (PF) 100 MCG/2ML IJ SOLN
INTRAMUSCULAR | Status: AC
  Filled 2022-05-03: qty 2

## 2022-05-03 MED ORDER — LABETALOL HCL 100 MG PO TABS
300.0000 mg | ORAL_TABLET | Freq: Two times a day (BID) | ORAL | Status: DC
  Administered 2022-05-03 – 2022-05-05 (×3): 300 mg via ORAL
  Filled 2022-05-03: qty 3
  Filled 2022-05-03 (×3): qty 1

## 2022-05-03 MED ORDER — ACETAMINOPHEN 10 MG/ML IV SOLN
INTRAVENOUS | Status: AC
  Filled 2022-05-03: qty 100

## 2022-05-03 MED ORDER — METOCLOPRAMIDE HCL 5 MG PO TABS: 5.0000 mg | ORAL_TABLET | Freq: Three times a day (TID) | ORAL | Status: DC | PRN

## 2022-05-03 MED ORDER — ACETAMINOPHEN 500 MG PO TABS
500.0000 mg | ORAL_TABLET | Freq: Three times a day (TID) | ORAL | Status: DC
  Administered 2022-05-03 – 2022-05-05 (×6): 500 mg via ORAL
  Filled 2022-05-03 (×6): qty 1

## 2022-05-03 MED ORDER — FENTANYL CITRATE (PF) 250 MCG/5ML IJ SOLN
INTRAMUSCULAR | Status: AC
  Filled 2022-05-03: qty 5

## 2022-05-03 MED ORDER — CEFAZOLIN SODIUM-DEXTROSE 1-4 GM/50ML-% IV SOLN
1.0000 g | Freq: Two times a day (BID) | INTRAVENOUS | Status: AC
  Administered 2022-05-04: 1 g via INTRAVENOUS
  Filled 2022-05-03: qty 50

## 2022-05-03 MED ORDER — CEFAZOLIN SODIUM-DEXTROSE 1-4 GM/50ML-% IV SOLN
1.0000 g | Freq: Once | INTRAVENOUS | Status: AC
  Administered 2022-05-03: 1 g via INTRAVENOUS
  Filled 2022-05-03: qty 50

## 2022-05-03 MED ORDER — POTASSIUM CHLORIDE IN NACL 20-0.9 MEQ/L-% IV SOLN
INTRAVENOUS | Status: DC
  Filled 2022-05-03: qty 1000

## 2022-05-03 MED ORDER — BUPIVACAINE LIPOSOME 1.3 % IJ SUSP
INTRAMUSCULAR | Status: DC | PRN
  Administered 2022-05-03: 15 mL

## 2022-05-03 MED ORDER — KETAMINE HCL 50 MG/5ML IJ SOSY
PREFILLED_SYRINGE | INTRAMUSCULAR | Status: AC
  Filled 2022-05-03: qty 5

## 2022-05-03 MED ORDER — PHENYLEPHRINE 80 MCG/ML (10ML) SYRINGE FOR IV PUSH (FOR BLOOD PRESSURE SUPPORT)
PREFILLED_SYRINGE | INTRAVENOUS | Status: DC | PRN
  Administered 2022-05-03 (×2): 80 ug via INTRAVENOUS
  Administered 2022-05-03: 160 ug via INTRAVENOUS

## 2022-05-03 MED ORDER — MIDAZOLAM HCL 2 MG/2ML IJ SOLN
INTRAMUSCULAR | Status: DC | PRN
  Administered 2022-05-03: 2 mg via INTRAVENOUS

## 2022-05-03 MED ORDER — ACETAMINOPHEN 10 MG/ML IV SOLN
1000.0000 mg | Freq: Once | INTRAVENOUS | Status: AC
  Administered 2022-05-03: 1000 mg via INTRAVENOUS

## 2022-05-03 MED ORDER — CHLORHEXIDINE GLUCONATE 0.12 % MT SOLN
15.0000 mL | Freq: Once | OROMUCOSAL | Status: AC
  Administered 2022-05-03: 15 mL via OROMUCOSAL
  Filled 2022-05-03: qty 15

## 2022-05-03 MED ORDER — ROCURONIUM BROMIDE 10 MG/ML (PF) SYRINGE
PREFILLED_SYRINGE | INTRAVENOUS | Status: DC | PRN
  Administered 2022-05-03: 50 mg via INTRAVENOUS
  Administered 2022-05-03: 10 mg via INTRAVENOUS

## 2022-05-03 SURGICAL SUPPLY — 61 items
BAG COUNTER SPONGE SURGICOUNT (BAG) ×1 IMPLANT
BAG SPNG CNTER NS LX DISP (BAG) ×1
BIT DRILL LONG 4.2 (BIT) IMPLANT
BIT DRILL SHORT 4.2 (BIT) IMPLANT
BLADE SURG 10 STRL SS (BLADE) ×1 IMPLANT
BNDG ELASTIC 4X5.8 VLCR STR LF (GAUZE/BANDAGES/DRESSINGS) ×1 IMPLANT
BNDG ELASTIC 6X5.8 VLCR STR LF (GAUZE/BANDAGES/DRESSINGS) ×1 IMPLANT
BRUSH SCRUB EZ PLAIN DRY (MISCELLANEOUS) ×2 IMPLANT
COVER SURGICAL LIGHT HANDLE (MISCELLANEOUS) ×2 IMPLANT
DRAPE C-ARM 42X72 X-RAY (DRAPES) ×1 IMPLANT
DRAPE C-ARMOR (DRAPES) ×1 IMPLANT
DRAPE HALF SHEET 40X57 (DRAPES) IMPLANT
DRAPE INCISE IOBAN 66X45 STRL (DRAPES) IMPLANT
DRAPE U-SHAPE 47X51 STRL (DRAPES) ×1 IMPLANT
DRILL BIT SHORT 4.2 (BIT) ×1
DRSG ADAPTIC 3X8 NADH LF (GAUZE/BANDAGES/DRESSINGS) ×1 IMPLANT
DRSG MEPITEL 4X7.2 (GAUZE/BANDAGES/DRESSINGS) ×1 IMPLANT
ELECT REM PT RETURN 9FT ADLT (ELECTROSURGICAL) ×1
ELECTRODE REM PT RTRN 9FT ADLT (ELECTROSURGICAL) ×1 IMPLANT
GAUZE PAD ABD 8X10 STRL (GAUZE/BANDAGES/DRESSINGS) ×2 IMPLANT
GAUZE SPONGE 4X4 12PLY STRL (GAUZE/BANDAGES/DRESSINGS) ×1 IMPLANT
GLOVE BIO SURGEON STRL SZ7.5 (GLOVE) ×1 IMPLANT
GLOVE BIO SURGEON STRL SZ8 (GLOVE) ×1 IMPLANT
GLOVE BIO SURGEON STRL SZ8.5 (GLOVE) ×1 IMPLANT
GLOVE BIOGEL PI IND STRL 7.5 (GLOVE) ×1 IMPLANT
GLOVE BIOGEL PI IND STRL 8 (GLOVE) ×1 IMPLANT
GLOVE SURG ORTHO LTX SZ7.5 (GLOVE) ×2 IMPLANT
GOWN STRL REUS W/ TWL LRG LVL3 (GOWN DISPOSABLE) ×2 IMPLANT
GOWN STRL REUS W/ TWL XL LVL3 (GOWN DISPOSABLE) ×1 IMPLANT
GOWN STRL REUS W/TWL LRG LVL3 (GOWN DISPOSABLE) ×2
GOWN STRL REUS W/TWL XL LVL3 (GOWN DISPOSABLE) ×1
GUIDEWIRE 3.2X400 (WIRE) IMPLANT
KIT BASIN OR (CUSTOM PROCEDURE TRAY) ×1 IMPLANT
KIT TURNOVER KIT B (KITS) ×1 IMPLANT
NAIL TFNA 10X330 (Nail) IMPLANT
PACK ORTHO EXTREMITY (CUSTOM PROCEDURE TRAY) ×1 IMPLANT
PAD ARMBOARD 7.5X6 YLW CONV (MISCELLANEOUS) ×2 IMPLANT
PAD CAST 4YDX4 CTTN HI CHSV (CAST SUPPLIES) ×1 IMPLANT
PADDING CAST COTTON 4X4 STRL (CAST SUPPLIES) ×1
PADDING CAST COTTON 6X4 STRL (CAST SUPPLIES) ×1 IMPLANT
REAMER ROD 3.8 BALL TIP 3X950 (ORTHOPEDIC DISPOSABLE SUPPLIES) IMPLANT
SCREW LOCK IM 44X5X (Screw) IMPLANT
SCREW LOCK IM 5X36 (Screw) ×2 IMPLANT
SCREW LOCK IM 68X5X IM NL (Spacer) IMPLANT
SCREW LOCK IM NAIL 5.0X34 (Screw) IMPLANT
SCREW LOCK IM NAIL 5X44 (Screw) ×1 IMPLANT
SCREW LOCK IM NAIL 5X50 (Screw) IMPLANT
SCREW LOCK IM NAIL 5X68 (Spacer) ×2 IMPLANT
SCREW LOCK IM NL 5X74 (Screw) IMPLANT
SCREW LOCK IM TI 5X30 (Screw) IMPLANT
SCREW LOCK IM TI 5X42 (Screw) IMPLANT
SCREW LOCK X25 36X5X IM (Screw) IMPLANT
STAPLER VISISTAT 35W (STAPLE) ×1 IMPLANT
SUT ETHILON 2 0 FS 18 (SUTURE) ×2 IMPLANT
SUT VIC AB 0 CT1 27 (SUTURE)
SUT VIC AB 0 CT1 27XBRD ANBCTR (SUTURE) IMPLANT
SUT VIC AB 2-0 CT1 27 (SUTURE) ×1
SUT VIC AB 2-0 CT1 TAPERPNT 27 (SUTURE) ×1 IMPLANT
TOWEL GREEN STERILE (TOWEL DISPOSABLE) ×2 IMPLANT
TOWEL GREEN STERILE FF (TOWEL DISPOSABLE) ×1 IMPLANT
YANKAUER SUCT BULB TIP NO VENT (SUCTIONS) IMPLANT

## 2022-05-03 NOTE — Progress Notes (Signed)
ANTICOAGULATION CONSULT NOTE - Initial Consult  Pharmacy Consult for apixaban Indication: VTE prophylaxis  Allergies  Allergen Reactions   Erythromycin Nausea And Vomiting   Levofloxacin     Causes muscle pain    Sulfa Antibiotics Hives    Patient Measurements: Height: 5\' 2"  (157.5 cm) Weight: 87.5 kg (193 lb) IBW/kg (Calculated) : 50.1 Heparin Dosing Weight:   Vital Signs: Temp: 97.9 F (36.6 C) (04/02 1525) Temp Source: Oral (04/02 0849) BP: 136/90 (04/02 1525) Pulse Rate: 58 (04/02 1530)  Labs: Recent Labs    05/03/22 0908  HGB 9.7*  HCT 31.9*  PLT 194  CREATININE 4.00*    Estimated Creatinine Clearance: 14.2 mL/min (A) (by C-G formula based on SCr of 4 mg/dL (H)).   Medical History: Past Medical History:  Diagnosis Date   Anemia in chronic kidney disease (CKD)    Arthritis    Asthma    Blood dyscrasia    systemic lupus   Chronic kidney disease (CKD), active medical management without dialysis, stage 4 (severe)    Dyspnea    GERD (gastroesophageal reflux disease)    Gout    Hypertension    Lupus    Narcolepsy    Pneumonia    x 1   Sleep apnea    tries to use CPAP nightly as of 04/29/22   Walker as Health and safety inspector dependent    when going outside home    Medications:  Medications Prior to Admission  Medication Sig Dispense Refill Last Dose   acetaminophen (TYLENOL) 500 MG tablet Take 500 mg by mouth every 6 (six) hours as needed (for pain.).   Past Week   albuterol (PROVENTIL HFA;VENTOLIN HFA) 108 (90 BASE) MCG/ACT inhaler Inhale 2 puffs into the lungs every 6 (six) hours as needed for wheezing or shortness of breath.   05/03/2022 at 0800   allopurinol (ZYLOPRIM) 100 MG tablet Take 150 mg by mouth daily.   05/02/2022   clobetasol ointment (TEMOVATE) AB-123456789 % Apply 1 Application topically 2 (two) times daily as needed (eczema).   Past Week   colchicine 0.6 MG tablet Take 0.6 mg by mouth every other day.   05/02/2022   denosumab (PROLIA) 60  MG/ML SOLN injection Inject 60 mg into the skin every 6 (six) months. Administer in upper arm, thigh, or abdomen   Past Month   felodipine (PLENDIL) 10 MG 24 hr tablet Take 10 mg by mouth daily.   05/02/2022   furosemide (LASIX) 40 MG tablet Take 40 mg by mouth daily as needed (fluid retention.).   05/02/2022   hydroxychloroquine (PLAQUENIL) 200 MG tablet Take 200 mg by mouth daily.    Past Week   labetalol (NORMODYNE) 300 MG tablet Take 300 mg by mouth 2 (two) times daily.   05/03/2022 at 0730   triamcinolone ointment (KENALOG) 0.1 % Apply 1 Application topically 2 (two) times daily as needed (eczema).   Past Week   Vitamin D, Ergocalciferol, (DRISDOL) 1.25 MG (50000 UNIT) CAPS capsule Take 1 capsule (50,000 Units total) by mouth every 7 (seven) days. 5 capsule 6 Past Month   acetaminophen-codeine (TYLENOL #3) 300-30 MG tablet Take 1 tablet by mouth every 8 (eight) hours as needed for moderate pain. (Patient not taking: Reported on 04/28/2022) 30 tablet 0 Not Taking   budesonide-formoterol (SYMBICORT) 80-4.5 MCG/ACT inhaler Inhale 2 puffs into the lungs 2 (two) times daily as needed (asthma).   Unknown   Scheduled:   acetaminophen  500 mg Oral  Q8H   allopurinol  150 mg Oral Daily   [START ON 05/04/2022] apixaban  2.5 mg Oral BID   [START ON 05/04/2022] colchicine  0.6 mg Oral QODAY   docusate sodium  100 mg Oral BID   felodipine  10 mg Oral Daily   fentaNYL       hydroxychloroquine  200 mg Oral Daily   labetalol  300 mg Oral BID   methocarbamol  500 mg Oral TID   mometasone-formoterol  2 puff Inhalation BID    Assessment: Pt is s/p repair nonunion. Apixaban ordered for 30d for DVT prophylaxis. Pt was not on anticoagulation prior to admission.   Copay $47.00  Scr 4 Hgb 9.7, plt wnl  Goal of Therapy:  Monitor platelets by anticoagulation protocol: Yes   Plan:  Apixaban 2.5mg  BID x30 days start 4/3 Rx will monitor peripherally  Onnie Boer, PharmD, BCIDP, AAHIVP, CPP Infectious Disease  Pharmacist 05/03/2022 3:51 PM

## 2022-05-03 NOTE — Anesthesia Procedure Notes (Signed)
Procedure Name: Intubation Date/Time: 05/03/2022 10:55 AM  Performed by: Leonor Liv, CRNAPre-anesthesia Checklist: Patient identified, Emergency Drugs available, Suction available and Patient being monitored Patient Re-evaluated:Patient Re-evaluated prior to induction Oxygen Delivery Method: Circle System Utilized Preoxygenation: Pre-oxygenation with 100% oxygen Induction Type: IV induction Ventilation: Mask ventilation without difficulty and Oral airway inserted - appropriate to patient size Laryngoscope Size: Mac and 3 Grade View: Grade I Tube type: Oral Tube size: 7.0 mm Number of attempts: 1 Airway Equipment and Method: Stylet Placement Confirmation: ETT inserted through vocal cords under direct vision, positive ETCO2 and breath sounds checked- equal and bilateral Secured at: 21 cm Tube secured with: Tape Dental Injury: Teeth and Oropharynx as per pre-operative assessment

## 2022-05-03 NOTE — Progress Notes (Signed)
PHARMACY NOTE:  ANTIMICROBIAL RENAL DOSAGE ADJUSTMENT  Current antimicrobial regimen includes a mismatch between antimicrobial dosage and estimated renal function.  As per policy approved by the Pharmacy & Therapeutics and Medical Executive Committees, the antimicrobial dosage will be adjusted accordingly.  Current antimicrobial dosage:  Ancef 1gm IV q6h x 3 post-op  Indication: post-op surgical prophylaxi  Renal Function:  Estimated Creatinine Clearance: 14.2 mL/min (A) (by C-G formula based on SCr of 4 mg/dL (H)).    Antimicrobial dosage has been changed to:  Ancef 1gm IV q12h x 1 dose post-op   Thank you for allowing pharmacy to be a part of this patient's care.  Sherlon Handing, PharmD, BCPS Please see amion for complete clinical pharmacist phone list 05/03/2022 3:39 PM

## 2022-05-03 NOTE — TOC Benefit Eligibility Note (Signed)
Patient Teacher, English as a foreign language completed.    The patient is currently admitted and upon discharge could be taking Eliquis 2.5 mg.  The current 30 day co-pay is $47.00.   The patient is insured through [employer] Part D   This test claim was processed through [facility] amounts may vary at other pharmacies due to pharmacy/plan contracts, or as the patient moves through the different stages of their insurance plan.  [name], [place] Patient Advocate Specialist [place] Patient Advocate Team Direct Number: [phone]  Fax: [phone]

## 2022-05-03 NOTE — Transfer of Care (Signed)
Immediate Anesthesia Transfer of Care Note  Patient: Teliyah Alzate Murphy-McKellar  Procedure(s) Performed: INTRAMEDULLARY (IM) NAIL TIBIAL (Right)  Patient Location: PACU  Anesthesia Type:General  Level of Consciousness: awake, alert , and oriented  Airway & Oxygen Therapy: Patient Spontanous Breathing and Patient connected to nasal cannula oxygen  Post-op Assessment: Report given to RN, Post -op Vital signs reviewed and stable, and Patient moving all extremities  Post vital signs: Reviewed and stable  Last Vitals:  Vitals Value Taken Time  BP 129/86 05/03/22 1340  Temp    Pulse 62 05/03/22 1345  Resp 18 05/03/22 1345  SpO2 95 % 05/03/22 1345  Vitals shown include unvalidated device data.  Last Pain:  Vitals:   05/03/22 0901  TempSrc:   PainSc: 0-No pain         Complications: No notable events documented.

## 2022-05-04 DIAGNOSIS — M84361A Stress fracture, right tibia, initial encounter for fracture: Secondary | ICD-10-CM | POA: Diagnosis not present

## 2022-05-04 NOTE — Social Work (Addendum)
  Transition of Care [facility]) Screening Note   Patient Details  Name: Colleen Obrien Date of Birth: 07-17-1955   Transition of Care [facility]) CM/SW Contact:    Coralee Pesa, [place] Phone Number: 05/04/2022, 11:08 AM    Transition of Care Department [facility]) has reviewed patient and acknowledge consult for next level of care needs, no recommendations have been made at this time. We will continue to monitor patient advancement through interdisciplinary progression rounds. If new patient transition needs arise, please place a TOC consult.    2:30 PT recommended HH for pt, CSW met with pt and family member at bedside. Pt agreeable to [facility] with no preference, states agreeable to what is covered by her insurance. Pt confirms a walker and wheelchair at home, asks about a [facility], CSW unsure if that would be covered. Pt notes good family support at home, and spouse will transport her home when ready. PCP is [facility] [employer] [facility] [employer]. CM notified of needs and will set up services. TOC will continue to follow.

## 2022-05-04 NOTE — Addendum Note (Signed)
Addendum  created 05/04/22 1849 by Barnet Glasgow, MD   Attestation recorded in [place], [place] filed

## 2022-05-04 NOTE — Anesthesia Postprocedure Evaluation (Signed)
Anesthesia Post Note  Patient: Colleen Obrien  Procedure(s) Performed: INTRAMEDULLARY (IM) NAIL TIBIAL (Right)     Patient location during evaluation: PACU Anesthesia Type: General Level of consciousness: awake and alert Pain management: pain level controlled Vital Signs Assessment: post-procedure vital signs reviewed and stable Respiratory status: spontaneous breathing, nonlabored ventilation, respiratory function stable and patient connected to nasal cannula oxygen Cardiovascular status: blood pressure returned to baseline and stable Postop Assessment: no apparent nausea or vomiting Anesthetic complications: no   No notable events documented.  Last Vitals:  Vitals:   05/04/22 0451 05/04/22 0742  BP: (!) 142/87 (!) 157/91  Pulse: 74 79  Resp: 20 16  Temp: 36.7 C 36.9 C  SpO2: 100% 99%    Last Pain:  Vitals:   05/04/22 0742  TempSrc: Oral  PainSc:                  March Rummage Hilberto Burzynski

## 2022-05-04 NOTE — Progress Notes (Signed)
[facility] arranged for home health services with information on the AVS.  BSC ordered through [facility] and will be delivered to the pts room.

## 2022-05-04 NOTE — Discharge Instructions (Signed)
Orthopaedic Trauma Service Discharge Instructions   General Discharge Instructions  Orthopaedic Injuries:  Right proximal tibia stress fracture treated with intramedullary nailing  WEIGHT BEARING STATUS: Weight-bear as tolerated right leg, use walker or crutches.  RANGE OF MOTION/ACTIVITY: Unrestricted range of motion of right knee and ankle.  Activity as tolerated.  Slowly increase activity level  Bone health: Vitamin D levels look good  Review the following resource for additional information regarding bone health  [email or web address]  Wound Care: Daily wound care starting on 05/07/2022.  Okay to leave incisions open to air once there is no drainage.  Okay to shower and clean with soap and water only once there is no drainage.  You may cover the incisions if desired with a Mepilex also known as a silicone foam dressing.  Alternatively you can use 4 x 4 gauze and tape  Discharge Wound Care Instructions  Do NOT apply any ointments, solutions or lotions to pin sites or surgical wounds.  These prevent needed drainage and even though solutions like hydrogen peroxide kill bacteria, they also damage cells lining the pin sites that help fight infection.  Applying lotions or ointments can keep the wounds moist and can cause them to breakdown and open up as well. This can increase the risk for infection. When in doubt call the office.  Surgical incisions should be dressed daily.  If any drainage is noted, use one layer of adaptic or Mepitel, for gauze and tape.  Or you can use a silicone foam dressing which is what you have on currently.  [email or web address]  [email or web address].gl  These dressing supplies should be available at local  medical supply stores ([facility], [facility], etc). They are not usually carried at places like [facility], [facility], [facility], etc  Once the incision is completely dry and without drainage, it may be left open to air out.  Showering may begin 36-48 hours later.  Cleaning gently with soap and water.   DVT/PE prophylaxis: Eliquis 2.5 mg, 1 tablet every 12 hours for 30 days  Diet: as you were eating previously.  Can use over the counter stool softeners and bowel preparations, such as Miralax, to help with bowel movements.  Narcotics can be constipating.  Be sure to drink plenty of fluids  PAIN MEDICATION USE AND EXPECTATIONS  You have likely been given narcotic medications to help control your pain.  After a traumatic event that results in an fracture (broken bone) with or without surgery, it is ok to use narcotic pain medications to help control one's pain.  We understand that everyone responds to pain differently and each individual patient will be evaluated on a regular basis for the continued need for narcotic medications. Ideally, narcotic medication use should last no more than 6-8 weeks (coinciding with fracture healing).   As a patient it is your responsibility as well to monitor narcotic medication use and report the amount and frequency you use these medications when you come to your office visit.   We would also advise that if you are using narcotic medications, you should take a dose prior to therapy to maximize you participation.  IF YOU ARE ON NARCOTIC MEDICATIONS IT IS NOT PERMISSIBLE TO OPERATE A MOTOR VEHICLE (MOTORCYCLE/CAR/TRUCK/MOPED) OR HEAVY MACHINERY DO NOT MIX NARCOTICS WITH OTHER CNS (CENTRAL NERVOUS SYSTEM) DEPRESSANTS SUCH AS ALCOHOL   POST-OPERATIVE OPIOID TAPER INSTRUCTIONS: It is important to wean off of your opioid medication as soon as possible. If you do not need pain medication after your surgery  it is ok to stop day one. Opioids include: Codeine,  Hydrocodone(Norco, Vicodin), Oxycodone(Percocet, oxycontin) and hydromorphone amongst others.  Long term and even short term use of opiods can cause: Increased pain response Dependence Constipation Depression Respiratory depression And more.  Withdrawal symptoms can include Flu like symptoms Nausea, vomiting And more Techniques to manage these symptoms Hydrate well Eat regular healthy meals Stay active Use relaxation techniques(deep breathing, meditating, yoga) Do Not substitute Alcohol to help with tapering If you have been on opioids for less than two weeks and do not have pain than it is ok to stop all together.  Plan to wean off of opioids This plan should start within one week post op of your fracture surgery  Maintain the same interval or time between taking each dose and first decrease the dose.  Cut the total daily intake of opioids by one tablet each day Next start to increase the time between doses. The last dose that should be eliminated is the evening dose.    STOP SMOKING OR USING NICOTINE PRODUCTS!!!!  As discussed nicotine severely impairs your body's ability to heal surgical and traumatic wounds but also impairs bone healing.  Wounds and bone heal by forming microscopic blood vessels (angiogenesis) and nicotine is a vasoconstrictor (essentially, shrinks blood vessels).  Therefore, if vasoconstriction occurs to these microscopic blood vessels they essentially disappear and are unable to deliver necessary nutrients to the healing tissue.  This is one modifiable factor that you can do to dramatically increase your chances of healing your injury.    (This means no smoking, no nicotine gum, patches, etc)  DO NOT USE NONSTEROIDAL ANTI-INFLAMMATORY DRUGS (NSAID'S)  Using products such as Advil (ibuprofen), Aleve (naproxen), Motrin (ibuprofen) for additional pain control during fracture healing can delay and/or prevent the healing response.  If you would like to take over the  counter (OTC) medication, Tylenol (acetaminophen) is ok.  However, some narcotic medications that are given for pain control contain acetaminophen as well. Therefore, you should not exceed more than 4000 mg of tylenol in a day if you do not have liver disease.  Also note that there are may OTC medicines, such as cold medicines and allergy medicines that my contain tylenol as well.  If you have any questions about medications and/or interactions please ask your doctor/PA or your pharmacist.      ICE AND ELEVATE INJURED/OPERATIVE EXTREMITY  Using ice and elevating the injured extremity above your heart can help with swelling and pain control.  Icing in a pulsatile fashion, such as 20 minutes on and 20 minutes off, can be followed.    Do not place ice directly on skin. Make sure there is a barrier between to skin and the ice pack.    Using frozen items such as frozen peas works well as the conform nicely to the are that needs to be iced.  USE AN ACE WRAP OR TED HOSE FOR SWELLING CONTROL  In addition to icing and elevation, Ace wraps or TED hose are used to help limit and resolve swelling.  It is recommended to use Ace wraps or TED hose until you are informed to stop.    When using Ace Wraps start the wrapping distally (farthest away from the body) and wrap proximally (closer to the body)   Example: If you had surgery on your leg or thing and you do not have a splint on, start the ace wrap at the toes and work your way up to the thigh  If you had surgery on your upper extremity and do not have a splint on, start the ace wrap at your fingers and work your way up to the upper arm  IF YOU ARE IN A SPLINT OR CAST DO NOT REMOVE IT FOR ANY REASON   If your splint gets wet for any reason please contact the office immediately. You may shower in your splint or cast as long as you keep it dry.  This can be done by wrapping in a cast cover or garbage back (or similar)  Do Not stick any thing down your splint  or cast such as pencils, money, or hangers to try and scratch yourself with.  If you feel itchy take benadryl as prescribed on the bottle for itching  IF YOU ARE IN A CAM BOOT (BLACK BOOT)  You may remove boot periodically. Perform daily dressing changes as noted below.  Wash the liner of the boot regularly and wear a sock when wearing the boot. It is recommended that you sleep in the boot until told otherwise    Call office for the following: Temperature greater than 101F Persistent nausea and vomiting Severe uncontrolled pain Redness, tenderness, or signs of infection (pain, swelling, redness, odor or green/yellow discharge around the site) Difficulty breathing, headache or visual disturbances Hives Persistent dizziness or light-headedness Extreme fatigue Any other questions or concerns you may have after discharge  In an emergency, call 911 or go to an Emergency Department at a nearby hospital  HELPFUL INFORMATION  If you had a block, it will wear off between 8-24 hrs postop typically.  This is period when your pain may go from nearly zero to the pain you would have had postop without the block.  This is an abrupt transition but nothing dangerous is happening.  You may take an extra dose of narcotic when this happens.  You should wean off your narcotic medicines as soon as you are able.  Most patients will be off or using minimal narcotics before their first postop appointment.   We suggest you use the pain medication the first night prior to going to bed, in order to ease any pain when the anesthesia wears off. You should avoid taking pain medications on an empty stomach as it will make you nauseous.  Do not drink alcoholic beverages or take illicit drugs when taking pain medications.  In most states it is against the law to drive while you are in a splint or sling.  And certainly against the law to drive while taking narcotics.  You may return to work/school in the next couple of  days when you feel up to it.   Pain medication may make you constipated.  Below are a few solutions to try in this order: Decrease the amount of pain medication if you aren't having pain. Drink lots of decaffeinated fluids. Drink prune juice and/or each dried prunes  If the first 3 don't work start with additional solutions Take Colace - an over-the-counter stool softener Take Senokot - an over-the-counter laxative Take Miralax - a stronger over-the-counter laxative     CALL THE OFFICE WITH ANY QUESTIONS OR CONCERNS: [phone]   VISIT OUR WEBSITE FOR ADDITIONAL INFORMATION: [email or web address]     Information on my medicine - ELIQUIS (apixaban)   Why was Eliquis prescribed for you? Eliquis was prescribed for you to reduce the risk of blood clots forming after orthopedic surgery.    What do You need to know about Eliquis? Take your Eliquis  TWICE DAILY - one tablet in the morning and one tablet in the evening with or without food.  It would be best to take the dose about the same time each day.  If you have difficulty swallowing the tablet whole please discuss with your pharmacist how to take the medication safely.  Take Eliquis exactly as prescribed by your doctor and DO NOT stop taking Eliquis without talking to the doctor who prescribed the medication.  Stopping without other medication to take the place of Eliquis may increase your risk of developing a clot.  After discharge, you should have regular check-up appointments with your healthcare provider that is prescribing your Eliquis.  What do you do if you miss a dose? If a dose of ELIQUIS is not taken at the scheduled time, take it as soon as possible on the same day and twice-daily administration should be resumed.  The dose should not be doubled to make up for a missed dose.  Do not take more than one tablet of ELIQUIS at the same time.  Important Safety Information A possible side effect of Eliquis is  bleeding. You should call your healthcare provider right away if you experience any of the following: Bleeding from an injury or your nose that does not stop. Unusual colored urine (red or dark brown) or unusual colored stools (red or black). Unusual bruising for unknown reasons. A serious fall or if you hit your head (even if there is no bleeding).  Some medicines may interact with Eliquis and might increase your risk of bleeding or clotting while on Eliquis. To help avoid this, consult your healthcare provider or pharmacist prior to using any new prescription or non-prescription medications, including herbals, vitamins, non-steroidal anti-inflammatory drugs (NSAIDs) and supplements.  This website has more information on Eliquis (apixaban): http://www.eliquis.com/eliquis/home

## 2022-05-04 NOTE — Evaluation (Signed)
Physical Therapy Evaluation Patient Details Name: Colleen Obrien MRN: [id number] DOB: 1955/02/13 Today's Date: 05/04/2022  History of Present Illness  Pt is 67 yo female presenting with R proximal tibia non-untion fracture. Status post IMN on 05/03/22. PMH: CKD, arthritis, asthma, blood dyscrasia, Dyspnea, GERD, Gout, HTN, Lupus, narcolepsy, pneumonia, sleep apnea.  Clinical Impression  Pt is presenting below baseline. Currently pt is Min a for transfers and bed mobility. She was limited with gait distance due to pain in the RLE. Due to pt current functional status, home set up and available assistance at home recommending skilled physical therapy services 3x/week on discharge from acute care hospital setting in order to improve strength, mobility and decrease pain to decrease risk for falls, injury and re-hospitalization. Pt was slightly short of breathe following transfer; O2 was replaced; pt does not wear O2 at home.      Recommendations for follow up therapy are one component of a multi-disciplinary discharge planning process, led by the attending physician.  Recommendations may be updated based on patient status, additional functional criteria and insurance authorization.  Follow Up Recommendations       Assistance Recommended at Discharge PRN  Patient can return home with the following  A little help with walking and/or transfers;Assist for transportation;Assistance with cooking/housework;Help with stairs or ramp for entrance    Equipment Recommendations None recommended by PT (pt has needed equipment at home.)  Recommendations for Other Services       Functional Status Assessment Patient has had a recent decline in their functional status and demonstrates the ability to make significant improvements in function in a reasonable and predictable amount of time.     Precautions / Restrictions Precautions Precautions: Fall Restrictions Weight Bearing Restrictions: Yes RLE  Weight Bearing: Weight bearing as tolerated      Mobility  Bed Mobility Overal bed mobility: Needs Assistance Bed Mobility: Supine to Sit     Supine to sit: Min assist     General bed mobility comments: Min A for the RLE Patient Response: Cooperative  Transfers Overall transfer level: Needs assistance Equipment used: Rolling walker (2 wheels) Transfers: Sit to/from Stand, Bed to chair/wheelchair/BSC Sit to Stand: Min assist   Step pivot transfers: Min assist       General transfer comment: Min A with verbal cues for sequencing and Min A for maintaining upright balance for transfer with guarded stepping.    Ambulation/Gait Ambulation/Gait assistance: Min assist Gait Distance (Feet): 3 Feet Assistive device: Rolling walker (2 wheels) Gait Pattern/deviations: Step-to pattern, Decreased stance time - right, Decreased step length - left   Gait velocity interpretation: <1.31 ft/sec, indicative of household ambulator Pre-gait activities: taking a couple of side steps at EOB in order to practice wgt shifting toward the R. General Gait Details: Pt only took a few steps toward recliner. Verbal cues for sequencing in order to off load the RLE for transfer. Pt did well.      Balance Overall balance assessment: Needs assistance   Sitting balance-Leahy Scale: Good     Standing balance support: Bilateral upper extremity supported Standing balance-Leahy Scale: Fair Standing balance comment: Min a for balance with dynamic functional activities         Pertinent Vitals/Pain Pain Assessment Pain Assessment: 0-10 Pain Score: 8  Pain Descriptors / Indicators: Stabbing Pain Intervention(s): Monitored during session, Limited activity within patient's tolerance, Patient requesting pain meds-RN notified    Home Living Family/patient expects to be discharged to:: Private residence Living Arrangements: Spouse/significant other  Available Help at Discharge: Family;Available 24  hours/day Type of Home: House Home Access: Stairs to enter Entrance Stairs-Rails: None Entrance Stairs-Number of Steps: 3   Home Layout: Able to live on main level with bedroom/bathroom Home Equipment: Conservation officer, nature (2 wheels);Wheelchair - manual      Prior Function Prior Level of Function : Independent/Modified Independent;History of Falls (last six months)             Mobility Comments: Pt was independent with ambulation prior to injury ADLs Comments: Pt was mod I with all ADL's.     Hand Dominance   Dominant Hand: Right    Extremity/Trunk Assessment   Upper Extremity Assessment Upper Extremity Assessment: Defer to OT evaluation    Lower Extremity Assessment Lower Extremity Assessment: RLE deficits/detail RLE Deficits / Details: IMN of the R tibia    Cervical / Trunk Assessment Cervical / Trunk Assessment: Normal  Communication   Communication: No difficulties  Cognition Arousal/Alertness: Awake/alert Behavior During Therapy: WFL for tasks assessed/performed Overall Cognitive Status: Within Functional Limits for tasks assessed        General Comments General comments (skin integrity, edema, etc.): Pt has very supportive family and friends. She will have assistance 24/7 at home and has been at home with fracture prior to surgery.        Assessment/Plan    PT Assessment Patient needs continued PT services  PT Problem List Decreased strength;Decreased mobility;Decreased activity tolerance;Pain;Decreased balance;Decreased range of motion       PT Treatment Interventions Therapeutic activities;DME instruction;Cognitive remediation;Gait training;Therapeutic exercise;Patient/family education;Stair training;Balance training;Functional mobility training;Neuromuscular re-education;Manual techniques    PT Goals (Current goals can be found in the Care Plan section)  Acute Rehab PT Goals Patient Stated Goal: To return home and decrease pain PT Goal Formulation:  With patient Time For Goal Achievement: 05/18/22 Potential to Achieve Goals: Good    Frequency Min 5X/week        AM-PAC PT "6 Clicks" Mobility  Outcome Measure Help needed turning from your back to your side while in a flat bed without using bedrails?: A Little Help needed moving from lying on your back to sitting on the side of a flat bed without using bedrails?: A Little Help needed moving to and from a bed to a chair (including a wheelchair)?: A Little Help needed standing up from a chair using your arms (e.g., wheelchair or bedside chair)?: A Little Help needed to walk in hospital room?: A Lot Help needed climbing 3-5 steps with a railing? : A Lot 6 Click Score: 16    End of Session Equipment Utilized During Treatment: Gait belt Activity Tolerance: Patient tolerated treatment well;No increased pain Patient left: in chair;with family/visitor present;with call bell/phone within reach Nurse Communication: Mobility status PT Visit Diagnosis: Unsteadiness on feet (R26.81);Other abnormalities of gait and mobility (R26.89)    Time: [id number] PT Time Calculation (min) (ACUTE ONLY): 42 min   Charges:   PT Evaluation $PT Eval Low Complexity: 1 Low PT Treatments $Therapeutic Activity: 23-37 mins        Tomma Rakers, DPT, CLT  Acute Rehabilitation Services Office: [phone] (Secure chat preferred)   Ander Purpura 05/04/2022, 12:12 PM

## 2022-05-04 NOTE — Evaluation (Signed)
Occupational Therapy Evaluation Patient Details Name: Colleen Obrien MRN: [id number] DOB: 11-18-1955 Today's Date: 05/04/2022   History of Present Illness Pt is 67 yo female presenting with R proximal tibia non-untion fracture. Status post IMN on 05/03/22. PMH: CKD, arthritis, asthma, blood dyscrasia, Dyspnea, GERD, Gout, HTN, Lupus, narcolepsy, pneumonia, sleep apnea.   Clinical Impression   PTA, pt lived with her husband and was mod I. Upon eval, pt requiring min A for basic transfers and up to mod A for LB ADL. Pt reporting she has good support at home to assist with BADL. Will continue to follow acutely to optimize safety and independence in ADL upon discharge.      Recommendations for follow up therapy are one component of a multi-disciplinary discharge planning process, led by the attending physician.  Recommendations may be updated based on patient status, additional functional criteria and insurance authorization.   Assistance Recommended at Discharge Intermittent Supervision/Assistance  Patient can return home with the following A little help with walking and/or transfers;A little help with bathing/dressing/bathroom;Assistance with cooking/housework;Assist for transportation;Help with stairs or ramp for entrance    Functional Status Assessment  Patient has had a recent decline in their functional status and demonstrates the ability to make significant improvements in function in a reasonable and predictable amount of time.  Equipment Recommendations  BSC/3in1    Recommendations for Other Services       Precautions / Restrictions Precautions Precautions: Fall Restrictions Weight Bearing Restrictions: Yes RLE Weight Bearing: Weight bearing as tolerated      Mobility Bed Mobility Overal bed mobility: Needs Assistance Bed Mobility: Supine to Sit     Supine to sit: Min assist     General bed mobility comments: Min A for the RLE    Transfers Overall transfer  level: Needs assistance Equipment used: Rolling walker (2 wheels) Transfers: Sit to/from Stand, Bed to chair/wheelchair/BSC Sit to Stand: Min assist     Step pivot transfers: Min assist     General transfer comment: Min A with verbal cues for sequencing and Min A for maintaining upright balance for transfer with guarded stepping.      Balance Overall balance assessment: Needs assistance   Sitting balance-Leahy Scale: Good     Standing balance support: Bilateral upper extremity supported Standing balance-Leahy Scale: Fair Standing balance comment: Min a for balance with dynamic functional activities                           ADL either performed or assessed with clinical judgement   ADL Overall ADL's : Needs assistance/impaired Eating/Feeding: Modified independent;Sitting Eating/Feeding Details (indicate cue type and reason): eating on arrival, but needing to use the restroom Grooming: Set up;Sitting   Upper Body Bathing: Set up;Sitting   Lower Body Bathing: Moderate assistance;Sit to/from stand   Upper Body Dressing : Set up;Sitting   Lower Body Dressing: Moderate assistance;Sit to/from stand   Toilet Transfer: Minimal assistance;Stand-pivot;Rolling walker (2 wheels);BSC/3in1   Toileting- Clothing Manipulation and Hygiene: Min guard;Sitting/lateral lean;Sit to/from stand Toileting - Clothing Manipulation Details (indicate cue type and reason): close guarding for safety due to pt with max attempts to guard pain and awkward positioning during pericare     Functional mobility during ADLs: Minimal assistance;Rolling walker (2 wheels)       Vision Baseline Vision/History: 1 Wears glasses Ability to See in Adequate Light: 0 Adequate Patient Visual Report: No change from baseline Vision Assessment?: No apparent visual deficits  Perception Perception Perception Tested?: No   Praxis Praxis Praxis tested?: Not tested    Pertinent Vitals/Pain Pain  Assessment Pain Assessment: Faces Faces Pain Scale: Hurts even more Pain Location: RLE Pain Descriptors / Indicators: Stabbing Pain Intervention(s): Limited activity within patient's tolerance, Monitored during session     Hand Dominance Right   Extremity/Trunk Assessment Upper Extremity Assessment Upper Extremity Assessment: Generalized weakness   Lower Extremity Assessment Lower Extremity Assessment: Defer to PT evaluation RLE Deficits / Details: IMN of the R tibia   Cervical / Trunk Assessment Cervical / Trunk Assessment: Normal   Communication Communication Communication: No difficulties   Cognition Arousal/Alertness: Awake/alert Behavior During Therapy: WFL for tasks assessed/performed Overall Cognitive Status: Within Functional Limits for tasks assessed                                 General Comments: some cues for carryover of RLE advancement with less pain     General Comments  Pt has very supportive family and friends. She will have assistance 24/7 at home and has been at home with fracture prior to surgery.    Exercises     Shoulder Instructions      Home Living Family/patient expects to be discharged to:: Private residence Living Arrangements: Spouse/significant other Available Help at Discharge: Family;Available 24 hours/day Type of Home: House Home Access: Stairs to enter [employer] of Steps: 3 Entrance Stairs-Rails: None Home Layout: Able to live on main level with bedroom/bathroom     Bathroom Shower/Tub: Teacher, early years/pre: Standard Bathroom Accessibility: Yes   Home Equipment: Conservation officer, nature (2 wheels);Wheelchair - manual          Prior Functioning/Environment Prior Level of Function : Independent/Modified Independent;History of Falls (last six months)             Mobility Comments: Pt was independent with ambulation prior to injury ADLs Comments: Pt was mod I with all ADL's.        OT  Problem List: Decreased strength;Decreased activity tolerance;Decreased range of motion;Impaired balance (sitting and/or standing);Decreased safety awareness;Decreased knowledge of use of DME or AE;Decreased knowledge of precautions      OT Treatment/Interventions: Self-care/ADL training;Therapeutic exercise;DME and/or AE instruction;Balance training;Patient/family education;Therapeutic activities    OT Goals(Current goals can be found in the care plan section) Acute Rehab OT Goals Patient Stated Goal: get better OT Goal Formulation: With patient Time For Goal Achievement: 05/18/22 Potential to Achieve Goals: Good  OT Frequency: Min 2X/week    Co-evaluation              AM-PAC OT "6 Clicks" Daily Activity     Outcome Measure Help from another person eating meals?: None Help from another person taking care of personal grooming?: A Little Help from another person toileting, which includes using toliet, bedpan, or urinal?: A Little Help from another person bathing (including washing, rinsing, drying)?: A Little Help from another person to put on and taking off regular upper body clothing?: A Little Help from another person to put on and taking off regular lower body clothing?: A Lot 6 Click Score: 18   End of Session Equipment Utilized During Treatment: Gait belt;Rolling walker (2 wheels) Nurse Communication: Mobility status  Activity Tolerance: Patient tolerated treatment well Patient left: in chair;with call bell/phone within reach;with chair alarm set  OT Visit Diagnosis: Muscle weakness (generalized) (M62.81);Other abnormalities of gait and mobility (R26.89);Unsteadiness on feet (R26.81)  Time: [id number] OT Time Calculation (min): 19 min Charges:  OT General Charges $OT Visit: 1 Visit OT Evaluation $OT Eval Low Complexity: 1 Low OT Treatments $Self Care/Home Management : 8-22 mins  Elder Cyphers, OTR/L [facility] Acute Rehabilitation Office:  [phone]   Magnus Ivan 05/04/2022, 3:23 PM

## 2022-05-04 NOTE — Progress Notes (Signed)
Orthopaedic Trauma Service Progress Note  Patient ID: Colleen Obrien MRN: [id number] DOB/AGE: April 05, 1955 67 y.o.  Subjective:  Doing fair Pain tolerable No acute issues    ROS As above  Objective:   VITALS:   Vitals:   05/03/22 2357 05/04/22 0451 05/04/22 0742 05/04/22 0843  BP: 114/74 (!) 142/87 (!) 157/91   Pulse: 62 74 79   Resp: (!) 21 20 16    Temp: 97.6 F (36.4 C) 98.1 F (36.7 C) 98.4 F (36.9 C)   TempSrc: Oral Oral Oral   SpO2: 100% 100% 99% 98%  Weight:      Height:        Estimated body mass index is 35.3 kg/m as calculated from the following:   Height as of this encounter: 5\' 2"  (1.575 m).   Weight as of this encounter: 87.5 kg.   Intake/Output      04/02 0701 04/03 0700 04/03 0701 04/04 0700   I.V. (mL/kg) 710 (8.1)    IV Piggyback 100    Total Intake(mL/kg) 810 (9.3)    Urine (mL/kg/hr) 250    Blood 50    Total Output 300    Net +510         Urine Occurrence 2 x      LABS  No results found for this or any previous visit (from the past 24 hour(s)).   PHYSICAL EXAM:   Gen: resting comfortably in bed, NAD, appears well Lungs: unlabored Cardiac: reg Ext:       Right Lower Extremity   Dressing clean, dry and intact  Ext warm   Minimal swelling  Distal motor and sensory functions intact  + DP pulse  No dct   Compartments are soft   No pain out of proportion with passive stretching of toes or ankle      Assessment/Plan: 1 Day Post-Op   Principal Problem:   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   Anti-infectives (From admission, onward)    Start     Dose/Rate Route Frequency Ordered Stop   05/03/22 2300  ceFAZolin (ANCEF) IVPB 1 g/50 mL premix        1 g 100 mL/hr over 30 Minutes Intravenous Every 12 hours 05/03/22 1528 05/04/22 0116   05/03/22 1530  hydroxychloroquine (PLAQUENIL) tablet 200 mg        200 mg Oral Daily  05/03/22 1528     05/03/22 0845  ceFAZolin (ANCEF) IVPB 1 g/50 mL premix        1 g 100 mL/hr over 30 Minutes Intravenous  Once 05/03/22 0842 05/03/22 1131     .  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 Dose adjusted for renal function   - Metabolic Bone Disease:  Due to CKD   Vitamin d levels look good  - Activity:  As above  - FEN/GI prophylaxis/Foley/Lines:  Renal diet    - Impediments to fracture healing:  Established nonunion    - Dispo:  Therapy evals  DME  Home tomorrow   Jari Pigg, PA-C [phone] (C) 05/04/2022, 9:36 AM  [facility] [place] [phone] Jenetta [phone] (F)    After 5pm and on the weekends please log on to Amion, go to orthopaedics and the look under the [facility] Call for the provider(s) on call. You can also call our office at [phone] and then follow the prompts to be connected to the call team.  Patient ID: Colleen Obrien, female   DOB: [birth year], 67 y.o.   MRN: [id number]

## 2022-05-04 NOTE — Progress Notes (Signed)
The patient requires a bedside commode as she will be unable to make it to the restroom ina timely manner on the level of her home in which she will be staying.

## 2022-05-05 ENCOUNTER — Other Ambulatory Visit (HOSPITAL_COMMUNITY): Payer: Self-pay

## 2022-05-05 ENCOUNTER — Encounter (HOSPITAL_COMMUNITY): Payer: Self-pay | Admitting: Orthopedic Surgery

## 2022-05-05 ENCOUNTER — Encounter (HOSPITAL_COMMUNITY): Payer: Self-pay

## 2022-05-05 DIAGNOSIS — M810 Age-related osteoporosis without current pathological fracture: Secondary | ICD-10-CM

## 2022-05-05 DIAGNOSIS — M84361A Stress fracture, right tibia, initial encounter for fracture: Secondary | ICD-10-CM | POA: Diagnosis not present

## 2022-05-05 HISTORY — DX: Age-related osteoporosis without current pathological fracture: M81.0

## 2022-05-05 MED ORDER — METHOCARBAMOL 500 MG PO TABS
500.0000 mg | ORAL_TABLET | Freq: Three times a day (TID) | ORAL | 0 refills | Status: DC | PRN
  Filled 2022-05-05: qty 40, 14d supply, fill #0

## 2022-05-05 MED ORDER — DOCUSATE SODIUM 100 MG PO CAPS
100.0000 mg | ORAL_CAPSULE | Freq: Two times a day (BID) | ORAL | 0 refills | Status: DC
  Filled 2022-05-05: qty 30, 15d supply, fill #0

## 2022-05-05 MED ORDER — APIXABAN 2.5 MG PO TABS
2.5000 mg | ORAL_TABLET | Freq: Two times a day (BID) | ORAL | 0 refills | Status: DC
  Filled 2022-05-05: qty 60, 30d supply, fill #0

## 2022-05-05 MED ORDER — HYDROCODONE-ACETAMINOPHEN 5-325 MG PO TABS
1.0000 | ORAL_TABLET | Freq: Four times a day (QID) | ORAL | 0 refills | Status: DC | PRN
  Filled 2022-05-05: qty 50, 7d supply, fill #0

## 2022-05-05 NOTE — Discharge Summary (Signed)
Orthopaedic Trauma Service (OTS) Discharge Summary   Patient ID: Colleen Obrien MRN: [id number] DOB/AGE: [birth year] 67 y.o.  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 Active Problems:   Obstructive sleep apnea   Primary narcolepsy with cataplexy   Anemia of chronic renal failure   Chronic renal disease, stage IV   Osteoporosis   Past Medical History:  Diagnosis Date   Anemia in chronic kidney disease (CKD)    Arthritis    Asthma    Blood dyscrasia    systemic lupus   Chronic kidney disease (CKD), active medical management without dialysis, stage 4 (severe)    Dyspnea    GERD (gastroesophageal reflux disease)    Gout    Hypertension    Lupus    Narcolepsy    Osteoporosis 05/05/2022   Pneumonia    x 1   Sleep apnea    tries to use CPAP nightly as of 04/29/22   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 Marcelino Scot  Intramedullary nailing of right proximal tibia nonunion  Discharged Condition: good  Hospital Course:   Patient is a very pleasant 67 year old female with medical history notable for SLE and CKD stage IV.  She sustained a stress fracture to her right proximal tibia back in September 2023 and was followed by Dr. Erlinda Hong.  Unfortunately at her last visit she was noted to have complete fracture with nonunion of her right proximal tibia and she was referred to the orthopedic trauma service for management.  Multiple options were discussed with the patient we felt that surgery was warranted given the duration of her predicament as well as her symptoms.  Patient was in agreement.  She was taken to the OR on 05/03/2022 for the  procedure noted above.  She was transferred to the PACU for recovery from anesthesia transferred to the [place] floor for observation, pain control therapies.  Patient did progress little slowly as her mobility was limited by pain.  She progressed well over the next 2 days and on postop day #2 she was deemed stable for discharge to home with home health services.  Patient was covered with Eliquis for DVT and PE prophylaxis  She received Ancef for perioperative antibiosis and this was dosed out in accordance with her creatinine clearance  Patient stable for discharge on 05/05/2022  She will follow-up with orthopedics in 10 to 14 days follow-up for follow-up x-rays and suture removal.  She is weightbearing as tolerated on the right leg with use of walker.  She has no motion restrictions of her knee and ankle on the right leg Consults: None  Significant Diagnostic Studies: labs:   Latest Reference Range & Units 05/03/22 09:08  Sodium 135 - 145 mmol/L 137  Potassium 3.5 - 5.1 mmol/L 4.8  Chloride 98 - 111 mmol/L 112 (H)  CO2 22 - 32 mmol/L 13 (L)  Glucose 70 - 99 mg/dL 88  BUN 8 - 23 mg/dL 48 (H)  Creatinine 0.44 - 1.00 mg/dL 4.00 (H)  Calcium 8.9 - 10.3 mg/dL 8.4 (L)  Anion gap 5 - 15  12  Alkaline Phosphatase 38 - 126 U/L 73  Albumin 3.5 - 5.0 g/dL 3.4 (L)  AST 15 - 41 U/L 15  ALT 0 - 44 U/L 13  Total Protein 6.5 - 8.1 g/dL 6.8  Total Bilirubin 0.3 - 1.2 mg/dL 0.6  GFR, Estimated >60 mL/min 12 (L)  Vitamin D, 25-Hydroxy 30 - 100 ng/mL 47.49  WBC 4.0 - 10.5 K/uL 5.2  RBC 3.87 - 5.11 MIL/uL 3.54 (L)  Hemoglobin 12.0 - 15.0 g/dL 9.7 (L)  HCT 36.0 - 46.0 % 31.9 (L)  MCV 80.0 - 100.0 fL 90.1  MCH 26.0 - 34.0 pg 27.4  MCHC 30.0 - 36.0 g/dL 30.4  RDW 11.5 - 15.5 % 16.6 (H)  Platelets 150 - 400 K/uL 194  nRBC 0.0 - 0.2 % 0.0  Neutrophils % 67  Lymphocytes % 15  Monocytes Relative % 9  Eosinophil % 8  Basophil % 1  Immature Granulocytes % 0  NEUT# 1.7 - 7.7 K/uL 3.5  Lymphocyte #  0.7 - 4.0 K/uL 0.8  Monocyte # 0.1 - 1.0 K/uL 0.5  Eosinophils Absolute 0.0 - 0.5 K/uL 0.4  Basophils Absolute 0.0 - 0.1 K/uL 0.0  Abs Immature Granulocytes 0.00 - 0.07 K/uL 0.02  (H): Data is abnormally high (L): Data is abnormally low   Treatments: IV hydration, antibiotics: Ancef, analgesia: acetaminophen and Norco, anticoagulation: Eliquis, therapies: PT, OT, and RN, and surgery: As above  Discharge Exam:       Orthopaedic Trauma Service Progress Note   Patient ID: Colleen Obrien MRN: [id number] DOB/AGE: 1955/06/30 67 y.o.   Subjective:   Doing very well Wants to go home  Pain tolerable  No acute issues    ROS As above   Objective:    VITALS:         Vitals:    05/04/22 1954 05/05/22 0456 05/05/22 0807 05/05/22 0854  BP: 139/87 (!) 153/95   (!) 149/85  Pulse: 85 96 94 95  Resp: 17 18 16 18   Temp: 98.2 F (36.8 C) 98.2 F (36.8 C)   98.4 F (36.9 C)  TempSrc: Oral Oral   Oral  SpO2: 95% 97% 96% 93%  Weight:          Height:              Estimated body mass index is 35.3 kg/m as calculated from the following:   Height as of this encounter: 5\' 2"  (1.575 m).   Weight as of this encounter: 87.5 kg.     Intake/Output      04/03 0701 04/04 0700 04/04 0701 04/05 0700   P.O. 840    I.V. (mL/kg)     IV Piggyback     Total Intake(mL/kg) 840 (9.6)    Urine (mL/kg/hr)     Blood     Total Output     Net +840         Urine Occurrence 1 x       LABS   Lab Results Last 24 Hours  No results found for this or any previous visit (from the past 24 hour(s)).       PHYSICAL EXAM:    Gen: resting comfortably in bed, NAD, appears well Lungs: unlabored Cardiac: reg Ext:       Right Lower Extremity              Dressing clean, dry and intact                         Dressing removed  All wounds are well sealed                         No active drainage                         No erythema             Ext warm               Minimal swelling             Distal motor and sensory functions intact             + DP pulse             No dct              Compartments are soft              No pain out of proportion with passive stretching of toes or ankle    Assessment/Plan: 2 Days Post-Op    Principal Problem:   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  Leave open to air once there is no drainage.  She may shower once there is no drainage and clean with soap and water only               PT/OT             Ice and elevate for swelling and  pain control    - Pain management:             Multimodal              Titrate accordingly    - ABL anemia/Hemodynamics             Stable   - Medical issues              CKD stage 4                          Labs stable             Home meds   - DVT/PE prophylaxis:             Eliquis 2.5 mg bid x 30 days             Mobilize    - ID:               Periop abx completed. Dose adjusted for renal function    - Metabolic Bone Disease:             Due to CKD              Vitamin d levels look good   - Activity:             As above   - FEN/GI prophylaxis/Foley/Lines:             Renal diet               - Impediments to fracture healing:             Established nonunion               - Dispo:             Discharge home today             Follow-up with orthopedics in 10 to 14 days for suture removal and x-rays  Disposition: Discharge disposition: 01-Home or Self Care       Discharge Instructions     Call MD / Call 911   Complete by: As directed    If you experience chest pain or shortness of breath, CALL 911 and be transported to the hospital emergency room.  If you develope a fever above 101 F, pus (white drainage) or increased drainage or redness at the wound, or calf pain, call your surgeon's office.   Constipation Prevention   Complete by: As directed    Drink plenty of fluids.  Prune juice may be helpful.  You may use a stool softener, such as Colace (over the counter) 100 mg twice a day.  Use MiraLax (over the counter) for constipation as needed.   Diet - low sodium heart healthy   Complete by: As directed    Discharge instructions   Complete by: As directed    Orthopaedic Trauma Service Discharge Instructions   General Discharge Instructions  Orthopaedic Injuries:  Right proximal tibia stress fracture treated with intramedullary nailing  WEIGHT BEARING STATUS: Weight-bear as tolerated right leg, use walker or crutches.  RANGE OF MOTION/ACTIVITY: Unrestricted range of motion of right knee and ankle.  Activity as tolerated.  Slowly increase activity level  Bone health: Vitamin D levels look good  Review the following resource for additional information regarding bone health  [email or web address]  Wound Care: Daily wound care starting on 05/07/2022.  Okay to leave incisions open  to air  once there is no drainage.  Okay to shower and clean with soap and water only once there is no drainage.  You may cover the incisions if desired with a Mepilex also known as a silicone foam dressing.  Alternatively you can use 4 x 4 gauze and tape  Discharge Wound Care Instructions  Do NOT apply any ointments, solutions or lotions to pin sites or surgical wounds.  These prevent needed drainage and even though solutions like hydrogen peroxide kill bacteria, they also damage cells lining the pin sites that help fight infection.  Applying lotions or ointments can keep the wounds moist and can cause them to breakdown and open up as well. This can increase the risk for infection. When in doubt call the office.  Surgical incisions should be dressed daily.  If any drainage is noted, use one layer of adaptic or Mepitel, for gauze and tape.  Or you can use a silicone foam dressing which is what you have on currently.  [email or web address].fr [email or web address].com.[email or web address]  CheapWipes.gl  These dressing supplies should be available at local medical supply stores ([facility], [facility], etc). They are not usually carried at places like [facility], [facility], [employer], etc  Once the incision is completely dry and without drainage, it may be left open to air out.  Showering may begin 36-48 hours later.  Cleaning gently with soap and water.   DVT/PE prophylaxis: Eliquis 2.5 mg, 1 tablet every 12 hours for 30 days  Diet: as you were eating previously.  Can use over the counter stool softeners and bowel preparations, such as Miralax, to help with bowel movements.  Narcotics can be constipating.  Be sure to drink plenty of fluids  PAIN MEDICATION USE AND EXPECTATIONS  You have likely been given narcotic  medications to help control your pain.  After a traumatic event that results in an fracture (broken bone) with or without surgery, it is ok to use narcotic pain medications to help control one's pain.  We understand that everyone responds to pain differently and each individual patient will be evaluated on a regular basis for the continued need for narcotic medications. Ideally, narcotic medication use should last no more than 6-8 weeks (coinciding with fracture healing).   As a patient it is your responsibility as well to monitor narcotic medication use and report the amount and frequency you use these medications when you come to your office visit.   We would also advise that if you are using narcotic medications, you should take a dose prior to therapy to maximize you participation.  IF YOU ARE ON NARCOTIC MEDICATIONS IT IS NOT PERMISSIBLE TO OPERATE A MOTOR VEHICLE (MOTORCYCLE/CAR/TRUCK/MOPED) OR HEAVY MACHINERY DO NOT MIX NARCOTICS WITH OTHER CNS (CENTRAL NERVOUS SYSTEM) DEPRESSANTS SUCH AS ALCOHOL   POST-OPERATIVE OPIOID TAPER INSTRUCTIONS:  It is important to wean off of your opioid medication as soon as possible. If you do not need pain medication after your surgery it is ok to stop day one.  Opioids include:  o Codeine, Hydrocodone(Norco, Vicodin), Oxycodone(Percocet, oxycontin) and hydromorphone amongst others.   Long term and even short term use of opiods can cause:  o Increased pain response  o Dependence  o Constipation  o Depression  o Respiratory depression  o And more.   Withdrawal symptoms can include  o Flu like symptoms  o Nausea, vomiting  o And more  Techniques to manage these symptoms  o Hydrate well  o Eat regular healthy meals  o  Stay active  o Use relaxation techniques(deep breathing, meditating, yoga)  Do Not substitute Alcohol to help with tapering  If you have been on opioids for less than two weeks and do not have pain than it is ok to stop all  together.   Plan to wean off of opioids  o This plan should start within one week post op of your fracture surgery   o Maintain the same interval or time between taking each dose and first decrease the dose.   o Cut the total daily intake of opioids by one tablet each day  o Next start to increase the time between doses.  o The last dose that should be eliminated is the evening dose.    STOP SMOKING OR USING NICOTINE PRODUCTS!!!!  As discussed nicotine severely impairs your body's ability to heal surgical and traumatic wounds but also impairs bone healing.  Wounds and bone heal by forming microscopic blood vessels (angiogenesis) and nicotine is a vasoconstrictor (essentially, shrinks blood vessels).  Therefore, if vasoconstriction occurs to these microscopic blood vessels they essentially disappear and are unable to deliver necessary nutrients to the healing tissue.  This is one modifiable factor that you can do to dramatically increase your chances of healing your injury.    (This means no smoking, no nicotine gum, patches, etc)  DO NOT USE NONSTEROIDAL ANTI-INFLAMMATORY DRUGS (NSAID'S)  Using products such as Advil (ibuprofen), Aleve (naproxen), Motrin (ibuprofen) for additional pain control during fracture healing can delay and/or prevent the healing response.  If you would like to take over the counter (OTC) medication, Tylenol (acetaminophen) is ok.  However, some narcotic medications that are given for pain control contain acetaminophen as well. Therefore, you should not exceed more than 4000 mg of tylenol in a day if you do not have liver disease.  Also note that there are may OTC medicines, such as cold medicines and allergy medicines that my contain tylenol as well.  If you have any questions about medications and/or interactions please ask your doctor/PA or your pharmacist.      ICE AND ELEVATE INJURED/OPERATIVE EXTREMITY  Using ice and elevating the injured extremity above your heart  can help with swelling and pain control.  Icing in a pulsatile fashion, such as 20 minutes on and 20 minutes off, can be followed.    Do not place ice directly on skin. Make sure there is a barrier between to skin and the ice pack.    Using frozen items such as frozen peas works well as the conform nicely to the are that needs to be iced.  USE AN ACE WRAP OR TED HOSE FOR SWELLING CONTROL  In addition to icing and elevation, Ace wraps or TED hose are used to help limit and resolve swelling.  It is recommended to use Ace wraps or TED hose until you are informed to stop.    When using Ace Wraps start the wrapping distally (farthest away from the body) and wrap proximally (closer to the body)   Example: If you had surgery on your leg or thing and you do not have a splint on, start the ace wrap at the toes and work your way up to the thigh        If you had surgery on your upper extremity and do not have a splint on, start the ace wrap at your fingers and work your way up to the upper arm  IF YOU ARE IN A SPLINT OR CAST DO  NOT REMOVE IT FOR ANY REASON   If your splint gets wet for any reason please contact the office immediately. You may shower in your splint or cast as long as you keep it dry.  This can be done by wrapping in a cast cover or garbage back (or similar)  Do Not stick any thing down your splint or cast such as pencils, money, or hangers to try and scratch yourself with.  If you feel itchy take benadryl as prescribed on the bottle for itching  IF YOU ARE IN A CAM BOOT (BLACK BOOT)  You may remove boot periodically. Perform daily dressing changes as noted below.  Wash the liner of the boot regularly and wear a sock when wearing the boot. It is recommended that you sleep in the boot until told otherwise    Call office for the following: ? Temperature greater than 101F ? Persistent nausea and vomiting ? Severe uncontrolled pain ? Redness, tenderness, or signs of infection (pain, swelling,  redness, odor or green/yellow discharge around the site) ? Difficulty breathing, headache or visual disturbances ? Hives ? Persistent dizziness or light-headedness ? Extreme fatigue ? Any other questions or concerns you may have after discharge  In an emergency, call 911 or go to an Emergency Department at a nearby hospital  HELPFUL INFORMATION  ? If you had a block, it will wear off between 8-24 hrs postop typically.  This is period when your pain may go from nearly zero to the pain you would have had postop without the block.  This is an abrupt transition but nothing dangerous is happening.  You may take an extra dose of narcotic when this happens.  ? You should wean off your narcotic medicines as soon as you are able.  Most patients will be off or using minimal narcotics before their first postop appointment.   ? We suggest you use the pain medication the first night prior to going to bed, in order to ease any pain when the anesthesia wears off. You should avoid taking pain medications on an empty stomach as it will make you nauseous.  ? Do not drink alcoholic beverages or take illicit drugs when taking pain medications.  ? In most states it is against the law to drive while you are in a splint or sling.  And certainly against the law to drive while taking narcotics.  ? You may return to work/school in the next couple of days when you feel up to it.   ? Pain medication may make you constipated.  Below are a few solutions to try in this order:   ? Decrease the amount of pain medication if you aren't having pain.   ? Drink lots of decaffeinated fluids.   ? Drink prune juice and/or each dried prunes   o If the first 3 don't work start with additional solutions   ? Take Colace - an over-the-counter stool softener   ? Take Senokot - an over-the-counter laxative   ? Take Miralax - a stronger over-the-counter laxative     CALL THE OFFICE WITH ANY QUESTIONS OR CONCERNS: [phone]    VISIT OUR WEBSITE FOR ADDITIONAL INFORMATION: [email or web address]   Do not put a pillow under the knee. Place it under the heel.   Complete by: As directed    Driving restrictions   Complete by: As directed    No driving until further notice   Increase activity slowly as tolerated   Complete by: As directed  Post-operative opioid taper instructions:   Complete by: As directed    POST-OPERATIVE OPIOID TAPER INSTRUCTIONS: It is important to wean off of your opioid medication as soon as possible. If you do not need pain medication after your surgery it is ok to stop day one. Opioids include: Codeine, Hydrocodone(Norco, Vicodin), Oxycodone(Percocet, oxycontin) and hydromorphone amongst others.  Long term and even short term use of opiods can cause: Increased pain response Dependence Constipation Depression Respiratory depression And more.  Withdrawal symptoms can include Flu like symptoms Nausea, vomiting And more Techniques to manage these symptoms Hydrate well Eat regular healthy meals Stay active Use relaxation techniques(deep breathing, meditating, yoga) Do Not substitute Alcohol to help with tapering If you have been on opioids for less than two weeks and do not have pain than it is ok to stop all together.  Plan to wean off of opioids This plan should start within one week post op of your joint replacement. Maintain the same interval or time between taking each dose and first decrease the dose.  Cut the total daily intake of opioids by one tablet each day Next start to increase the time between doses. The last dose that should be eliminated is the evening dose.      Weight bearing as tolerated   Complete by: As directed    Laterality: right   Extremity: Lower      Allergies as of 05/05/2022       Reactions   Erythromycin Nausea And Vomiting   Levofloxacin    Causes muscle pain    Sulfa Antibiotics Hives        Medication List     STOP taking these  medications    acetaminophen-codeine 300-30 MG tablet Commonly known as: TYLENOL #3       TAKE these medications    acetaminophen 500 MG tablet Commonly known as: TYLENOL Take 500 mg by mouth every 6 (six) hours as needed (for pain.).   albuterol 108 (90 Base) MCG/ACT inhaler Commonly known as: VENTOLIN HFA Inhale 2 puffs into the lungs every 6 (six) hours as needed for wheezing or shortness of breath.   allopurinol 100 MG tablet Commonly known as: ZYLOPRIM Take 150 mg by mouth daily.   apixaban 2.5 MG Tabs tablet Commonly known as: ELIQUIS Take 1 tablet (2.5 mg total) by mouth 2 (two) times daily.   budesonide-formoterol 80-4.5 MCG/ACT inhaler Commonly known as: SYMBICORT Inhale 2 puffs into the lungs 2 (two) times daily as needed (asthma).   clobetasol ointment 0.05 % Commonly known as: TEMOVATE Apply 1 Application topically 2 (two) times daily as needed (eczema).   colchicine 0.6 MG tablet Take 0.6 mg by mouth every other day.   denosumab 60 MG/ML Soln injection Commonly known as: PROLIA Inject 60 mg into the skin every 6 (six) months. Administer in upper arm, thigh, or abdomen   docusate sodium 100 MG capsule Commonly known as: COLACE Take 1 capsule (100 mg total) by mouth 2 (two) times daily.   felodipine 10 MG 24 hr tablet Commonly known as: PLENDIL Take 10 mg by mouth daily.   furosemide 40 MG tablet Commonly known as: LASIX Take 40 mg by mouth daily as needed (fluid retention.).   HYDROcodone-acetaminophen 5-325 MG tablet Commonly known as: NORCO/VICODIN Take 1-2 tablets by mouth every 6 (six) hours as needed for moderate pain or severe pain.   hydroxychloroquine 200 MG tablet Commonly known as: PLAQUENIL Take 200 mg by mouth daily.   labetalol 300 MG  tablet Commonly known as: NORMODYNE Take 300 mg by mouth 2 (two) times daily.   methocarbamol 500 MG tablet Commonly known as: ROBAXIN Take 1 tablet (500 mg total) by mouth every 8 (eight) hours  as needed for muscle spasms.   triamcinolone ointment 0.1 % Commonly known as: KENALOG Apply 1 Application topically 2 (two) times daily as needed (eczema).   Vitamin D (Ergocalciferol) 1.25 MG (50000 UNIT) Caps capsule Commonly known as: DRISDOL Take 1 capsule (50,000 Units total) by mouth every 7 (seven) days.               Durable Medical Equipment  (From admission, onward)           Start     Ordered   05/04/22 1447  For home use only DME Bedside commode  Once       Question:  Patient needs a bedside commode to treat with the following condition  Answer:  Tibia fracture   05/04/22 1450              Discharge Care Instructions  (From admission, onward)           Start     Ordered   05/05/22 0000  Weight bearing as tolerated       Question Answer Comment  Laterality right   Extremity Lower      05/05/22 1021            Follow-up Information     [facility], [facility] Follow up.   Specialty: Mildred Why: The home health agency will contact you for first home visit Contact information: [address] [place] [address] [phone]         Altamese [place], MD. Schedule an appointment as soon as possible for a visit in 2 week(s).   Specialty: Orthopedic Surgery Contact information: [place] [address] [phone]                 Discharge Instructions and Plan:  67 year old female with chronic right proximal tibia nonunion treated with intramedullary nailing  Weightbearing: WBAT right lower extremity Insicional and dressing care: Daily dressing changes with Mepilex or 4 x 4 gauze and tape and compression sock starting on 05/07/2022 Orthopedic device(s):  Walker Showering: Shower and clean wounds with soap and water only once there is no drainage VTE prophylaxis: Eliquis 2.5 mg twice daily for 30 days Pain control: Norco and Robaxin Bone Health/Optimization: Continue with home  supplementation.  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

## 2022-05-05 NOTE — Progress Notes (Signed)
Orthopaedic Trauma Service Progress Note  Patient ID: Colleen Obrien MRN: [id number] DOB/AGE: Mar 15, 1955 67 y.o.  Subjective:  Doing very well Wants to go home  Pain tolerable  No acute issues   ROS As above  Objective:   VITALS:   Vitals:   05/04/22 1954 05/05/22 0456 05/05/22 0807 05/05/22 0854  BP: 139/87 (!) 153/95  (!) 149/85  Pulse: 85 96 94 95  Resp: 17 18 16 18   Temp: 98.2 F (36.8 C) 98.2 F (36.8 C)  98.4 F (36.9 C)  TempSrc: Oral Oral  Oral  SpO2: 95% 97% 96% 93%  Weight:      Height:        Estimated body mass index is 35.3 kg/m as calculated from the following:   Height as of this encounter: 5\' 2"  (1.575 m).   Weight as of this encounter: 87.5 kg.   Intake/Output      04/03 0701 04/04 0700 04/04 0701 04/05 0700   P.O. 840    I.V. (mL/kg)     IV Piggyback     Total Intake(mL/kg) 840 (9.6)    Urine (mL/kg/hr)     Blood     Total Output     Net +840         Urine Occurrence 1 x      LABS  No results found for this or any previous visit (from the past 24 hour(s)).   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  Leave open to air once there is no drainage.  She may shower once there is no drainage and clean with soap and water only               PT/OT             Ice and elevate for swelling and pain control    - Pain management:             Multimodal              Titrate accordingly    - ABL anemia/Hemodynamics             Stable   - Medical issues              CKD stage 4                          Labs stable             Home meds   - DVT/PE prophylaxis:             Eliquis 2.5 mg bid x 30 days             Mobilize    - ID:              Periop abx completed. Dose adjusted for renal function    - Metabolic Bone Disease:             Due to CKD              Vitamin d levels look good   - Activity:             As above   - FEN/GI prophylaxis/Foley/Lines:             Renal diet               - Impediments to fracture healing:             Established nonunion               - Dispo:             Discharge home today  Follow-up with orthopedics in 10 to 14 days for suture removal and x-rays    Jari Pigg, PA-C [phone] (C) 05/05/2022, 10:10 AM  [facility] [place] [address] [phone] Jenetta [phone] (F)    After 5pm and on the weekends please log on to Amion, go to orthopaedics and  the look under the [facility] Call for the provider(s) on call. You can also call our office at [phone] and then follow the prompts to be connected to the call team.  Patient ID: Colleen Obrien, female   DOB: [birth year], 67 y.o.   MRN: [id number]

## 2022-05-05 NOTE — Progress Notes (Signed)
Occupational Therapy Treatment Patient Details Name: Colleen Obrien MRN: [id number] DOB: 1955-10-04 Today's Date: 05/05/2022   History of present illness Pt is 67 yo female presenting with R proximal tibia non-untion fracture. Status post IMN on 05/03/22. PMH: CKD, arthritis, asthma, blood dyscrasia, Dyspnea, GERD, Gout, HTN, Lupus, narcolepsy, pneumonia, sleep apnea.   OT comments  Pt progressing toward established OT goals. Pt performing LB ADL with min A for management of RLE and use of AE. Educated regarding tub/shower transfer. Decreased knowledge of compensatory techniques and requiring cues for safety. Recommending continued OT in her natural setting to optimize safety at home.    Recommendations for follow up therapy are one component of a multi-disciplinary discharge planning process, led by the attending physician.  Recommendations may be updated based on patient status, additional functional criteria and insurance authorization.    Assistance Recommended at Discharge Intermittent Supervision/Assistance  Patient can return home with the following  A little help with walking and/or transfers;A little help with bathing/dressing/bathroom;Assistance with cooking/housework;Assist for transportation;Help with stairs or ramp for entrance   Equipment Recommendations  BSC/3in1    Recommendations for Other Services      Precautions / Restrictions Precautions Precautions: Fall Restrictions Weight Bearing Restrictions: Yes RLE Weight Bearing: Weight bearing as tolerated       Mobility Bed Mobility               General bed mobility comments: in chair on arrival and departure    Transfers Overall transfer level: Needs assistance Equipment used: Rolling walker (2 wheels) Transfers: Sit to/from Stand, Bed to chair/wheelchair/BSC Sit to Stand: Supervision     Step pivot transfers: Supervision     General transfer comment: Occasional verbal cues for sequencing. Pt  did well without physical assistance today.     Balance Overall balance assessment: Needs assistance   Sitting balance-Leahy Scale: Good     Standing balance support: Bilateral upper extremity supported Standing balance-Leahy Scale: Fair Standing balance comment: no overt LOB.                           ADL either performed or assessed with clinical judgement   ADL Overall ADL's : Needs assistance/impaired                     Lower Body Dressing: Minimal assistance;Sit to/from stand;With adaptive equipment Lower Body Dressing Details (indicate cue type and reason): poor endurance and difficulty lifting RLE to use AE Toilet Transfer: Rolling walker (2 wheels);Ambulation;BSC/3in1;Supervision/safety       Tub/ Shower Transfer: Tub transfer;Minimal assistance;Ambulation;BSC/3in1;Rolling walker (2 wheels)   Functional mobility during ADLs: Min guard;Rolling walker (2 wheels) General ADL Comments: poor endurance requiring increased time and 3 rest breaks during session while getting dressed, going to restroom, and performing tub transfer    Extremity/Trunk Assessment Upper Extremity Assessment Upper Extremity Assessment: Generalized weakness   Lower Extremity Assessment Lower Extremity Assessment: Defer to PT evaluation        Vision   Vision Assessment?: No apparent visual deficits   Perception     Praxis      Cognition Arousal/Alertness: Awake/alert Behavior During Therapy: WFL for tasks assessed/performed Overall Cognitive Status: Within Functional Limits for tasks assessed                                 General Comments: cues for problem solving and new  learning        Exercises      Shoulder Instructions       General Comments pt friend was present throughout session. Pt improved mobility. She has stairs with and without a rail at home and is aware she does much better with a rail. Discused options including RW and SPC.  advised against SPC  due to heavy usage of rail.    Pertinent Vitals/ Pain       Pain Assessment Pain Assessment: Faces Faces Pain Scale: Hurts a little bit Pain Location: RLE Pain Descriptors / Indicators: Aching Pain Intervention(s): Limited activity within patient's tolerance, Monitored during session  Home Living                                          Prior Functioning/Environment              Frequency  Min 2X/week        Progress Toward Goals  OT Goals(current goals can now be found in the care plan section)  Progress towards OT goals: Progressing toward goals  Acute Rehab OT Goals Patient Stated Goal: go home OT Goal Formulation: With patient Time For Goal Achievement: 05/18/22 Potential to Achieve Goals: Good ADL Goals Pt Will Perform Lower Body Dressing: with supervision;sit to/from stand;with adaptive equipment Pt Will Transfer to Toilet: ambulating;with supervision Pt Will Perform Tub/Shower Transfer: 3 in 1;Tub transfer;with min guard assist;ambulating;rolling walker  Plan Discharge plan remains appropriate    Co-evaluation                 AM-PAC OT "6 Clicks" Daily Activity     Outcome Measure   Help from another person eating meals?: None Help from another person taking care of personal grooming?: A Little Help from another person toileting, which includes using toliet, bedpan, or urinal?: A Little Help from another person bathing (including washing, rinsing, drying)?: A Little Help from another person to put on and taking off regular upper body clothing?: A Little Help from another person to put on and taking off regular lower body clothing?: A Little 6 Click Score: 19    End of Session Equipment Utilized During Treatment: Gait belt;Rolling walker (2 wheels)  OT Visit Diagnosis: Muscle weakness (generalized) (M62.81);Other abnormalities of gait and mobility (R26.89);Unsteadiness on feet (R26.81)   Activity  Tolerance Patient tolerated treatment well   Patient Left in chair;with call bell/phone within reach;with chair alarm set   Nurse Communication Mobility status        Time: [id number] OT Time Calculation (min): 51 min  Charges: OT General Charges $OT Visit: 1 Visit OT Treatments $Self Care/Home Management : 38-52 mins  Colleen Obrien, OTR/L [facility] Acute Rehabilitation Office: [phone]   Magnus Ivan 05/05/2022, 12:41 PM

## 2022-05-05 NOTE — Progress Notes (Signed)
Physical Therapy Treatment Patient Details Name: Colleen Obrien MRN: [id number] DOB: Aug 03, 1955 Today's Date: 05/05/2022   History of Present Illness Pt is 67 yo female presenting with R proximal tibia non-untion fracture. Status post IMN on 05/03/22. PMH: CKD, arthritis, asthma, blood dyscrasia, Dyspnea, GERD, Gout, HTN, Lupus, narcolepsy, pneumonia, sleep apnea.    PT Comments    Pt is progressing towards goals. Pt is supervision for transfers and bed mobility. She was able to increase her gait distance and perform stairs per home set up. Pt requires increased assist without a rail but does have railings present on the back of the house. Due to pt current functional status, home set up and available assistance at home recommending skilled physical therapy services 3x/week on discharge from acute care hospital setting in order to improve strength, mobility and decrease pain to decrease risk for falls, injury and re-hospitalization. No signs/symptoms of cardiac or respiratory distress with activity.    Recommendations for follow up therapy are one component of a multi-disciplinary discharge planning process, led by the attending physician.  Recommendations may be updated based on patient status, additional functional criteria and insurance authorization.  Follow Up Recommendations       Assistance Recommended at Discharge PRN  Patient can return home with the following A little help with walking and/or transfers;Assist for transportation;Assistance with cooking/housework;Help with stairs or ramp for entrance   Equipment Recommendations  None recommended by PT    Recommendations for Other Services       Precautions / Restrictions Precautions Precautions: Fall Restrictions Weight Bearing Restrictions: Yes RLE Weight Bearing: Weight bearing as tolerated     Mobility  Bed Mobility Overal bed mobility: Needs Assistance Bed Mobility: Supine to Sit     Supine to sit:  Supervision     General bed mobility comments: Pt did not require assistance with her RLE this session Patient Response: Cooperative  Transfers Overall transfer level: Needs assistance Equipment used: Rolling walker (2 wheels) Transfers: Sit to/from Stand, Bed to chair/wheelchair/BSC Sit to Stand: Supervision   Step pivot transfers: Supervision       General transfer comment: Occasional verbal cues for sequencing. Pt did well without physical assistance today.    Ambulation/Gait Ambulation/Gait assistance: Min guard Gait Distance (Feet): 100 Feet Assistive device: Rolling walker (2 wheels) Gait Pattern/deviations: Decreased stance time - right, Decreased step length - left, Step-through pattern   Gait velocity interpretation: <1.8 ft/sec, indicate of risk for recurrent falls   General Gait Details: Partial step through on the L and full step through on the R with improved foot clearance.   Stairs Stairs: Yes Stairs assistance: Min assist, Min guard Stair Management: No rails, One rail Right Number of Stairs: 2 General stair comments: Attempted stairs with no rail which pt states she has on the front of her house at [place]. Pt was CGA with one rail and HHA on the other side. Encouraged pt to use stairs with rail at home and provided stair navigation with RW sheet in case that is not an option       Balance Overall balance assessment: Needs assistance   Sitting balance-Leahy Scale: Good     Standing balance support: Bilateral upper extremity supported Standing balance-Leahy Scale: Fair Standing balance comment: no overt LOB.        Cognition Arousal/Alertness: Awake/alert Behavior During Therapy: WFL for tasks assessed/performed Overall Cognitive Status: Within Functional Limits for tasks assessed           General  Comments General comments (skin integrity, edema, etc.): pt friend was present throughout session. Pt improved mobility. She has stairs with and  without a rail at home and is aware she does much better with a rail. Discused options including RW and SPC. advised against SPC  due to heavy usage of rail.      Pertinent Vitals/Pain Pain Assessment Pain Assessment: Faces Faces Pain Scale: Hurts a little bit Pain Location: RLE Pain Descriptors / Indicators: Aching Pain Intervention(s): Monitored during session     PT Goals (current goals can now be found in the care plan section) Acute Rehab PT Goals Patient Stated Goal: To return home and decrease pain PT Goal Formulation: With patient Time For Goal Achievement: 05/18/22 Potential to Achieve Goals: Good Progress towards PT goals: Progressing toward goals    Frequency    Min 5X/week      PT Plan Current plan remains appropriate       AM-PAC PT "6 Clicks" Mobility   Outcome Measure  Help needed turning from your back to your side while in a flat bed without using bedrails?: A Little Help needed moving from lying on your back to sitting on the side of a flat bed without using bedrails?: A Little Help needed moving to and from a bed to a chair (including a wheelchair)?: A Little Help needed standing up from a chair using your arms (e.g., wheelchair or bedside chair)?: A Little Help needed to walk in hospital room?: A Little Help needed climbing 3-5 steps with a railing? : A Little 6 Click Score: 18    End of Session Equipment Utilized During Treatment: Gait belt Activity Tolerance: Patient tolerated treatment well;No increased pain Patient left: in chair;with family/visitor present;with call bell/phone within reach Nurse Communication: Mobility status PT Visit Diagnosis: Unsteadiness on feet (R26.81);Other abnormalities of gait and mobility (R26.89)     Time: [id number] PT Time Calculation (min) (ACUTE ONLY): 24 min  Charges:  $Gait Training: 8-22 mins $Therapeutic Activity: 8-22 mins                     Tomma Rakers, DPT, CLT  Acute Rehabilitation  Services Office: [phone] (Secure chat preferred)    Ander Purpura 05/05/2022, 11:04 AM

## 2022-05-09 ENCOUNTER — Other Ambulatory Visit (HOSPITAL_COMMUNITY): Payer: Self-pay | Admitting: *Deleted

## 2022-05-10 ENCOUNTER — Inpatient Hospital Stay (HOSPITAL_COMMUNITY): Admission: RE | Admit: 2022-05-10 | Payer: Medicare PPO | Source: Ambulatory Visit

## 2022-05-17 ENCOUNTER — Inpatient Hospital Stay (HOSPITAL_COMMUNITY): Admission: RE | Admit: 2022-05-17 | Payer: Medicare PPO | Source: Ambulatory Visit

## 2022-05-17 ENCOUNTER — Encounter (HOSPITAL_COMMUNITY): Payer: Self-pay

## 2022-05-17 ENCOUNTER — Encounter (HOSPITAL_COMMUNITY)
Admission: RE | Admit: 2022-05-17 | Discharge: 2022-05-17 | Disposition: A | Discharge status: Home / Self Care | Payer: Medicare PPO | Source: Ambulatory Visit | Attending: Nephrology | Admitting: Nephrology

## 2022-05-17 VITALS — BP 157/100 | HR 69 | Temp 98.0°F | Resp 18 | Wt 194.0 lb

## 2022-05-17 DIAGNOSIS — N189 Chronic kidney disease, unspecified: Secondary | ICD-10-CM | POA: Diagnosis present

## 2022-05-17 DIAGNOSIS — D631 Anemia in chronic kidney disease: Secondary | ICD-10-CM

## 2022-05-17 LAB — RENAL FUNCTION PANEL
Albumin: 3.3 g/dL — ABNORMAL LOW (ref 3.5–5.0)
Anion gap: 9 (ref 5–15)
BUN: 50 mg/dL — ABNORMAL HIGH (ref 8–23)
CO2: 16 mmol/L — ABNORMAL LOW (ref 22–32)
Calcium: 8.8 mg/dL — ABNORMAL LOW (ref 8.9–10.3)
Chloride: 113 mmol/L — ABNORMAL HIGH (ref 98–111)
Creatinine, Ser: 4.27 mg/dL — ABNORMAL HIGH (ref 0.44–1.00)
GFR, Estimated: 11 mL/min — ABNORMAL LOW (ref >60)
Glucose, Bld: 86 mg/dL (ref 70–99)
Phosphorus: 5 mg/dL — ABNORMAL HIGH (ref 2.5–4.6)
Potassium: 4.8 mmol/L (ref 3.5–5.1)
Sodium: 138 mmol/L (ref 135–145)

## 2022-05-17 LAB — URIC ACID: Uric Acid, Serum: 5.6 mg/dL (ref 2.5–7.1)

## 2022-05-17 LAB — POCT HEMOGLOBIN-HEMACUE: Hemoglobin: 8.2 g/dL — ABNORMAL LOW (ref 12.0–15.0)

## 2022-05-17 MED ORDER — SODIUM CHLORIDE 0.9 % IV SOLN
510.0000 mg | INTRAVENOUS | Status: DC
  Administered 2022-05-17: 510 mg via INTRAVENOUS
  Filled 2022-05-17: qty 510

## 2022-05-17 MED ORDER — EPOETIN ALFA-EPBX 10000 UNIT/ML IJ SOLN
INTRAMUSCULAR | Status: AC
  Administered 2022-05-17: 20000 [IU] via SUBCUTANEOUS
  Filled 2022-05-17: qty 2

## 2022-05-17 MED ORDER — EPOETIN ALFA-EPBX 10000 UNIT/ML IJ SOLN: 20000.0000 [IU] | INTRAMUSCULAR | Status: DC

## 2022-05-18 ENCOUNTER — Other Ambulatory Visit (HOSPITAL_COMMUNITY): Payer: Self-pay | Admitting: Orthopedic Surgery

## 2022-05-18 DIAGNOSIS — R2241 Localized swelling, mass and lump, right lower limb: Secondary | ICD-10-CM

## 2022-05-18 LAB — PTH, INTACT AND CALCIUM
Calcium, Total (PTH): 8.7 mg/dL (ref 8.7–10.3)
PTH: 1996 pg/mL — ABNORMAL HIGH (ref 15–65)

## 2022-05-19 ENCOUNTER — Ambulatory Visit (HOSPITAL_COMMUNITY)
Admission: RE | Admit: 2022-05-19 | Discharge: 2022-05-19 | Disposition: A | Discharge status: Home / Self Care | Payer: Medicare PPO | Source: Ambulatory Visit | Attending: Cardiology | Admitting: Cardiology

## 2022-05-19 DIAGNOSIS — R2241 Localized swelling, mass and lump, right lower limb: Secondary | ICD-10-CM | POA: Insufficient documentation

## 2022-05-24 ENCOUNTER — Encounter (HOSPITAL_COMMUNITY)
Admission: RE | Admit: 2022-05-24 | Discharge: 2022-05-24 | Disposition: A | Discharge status: Home / Self Care | Payer: Medicare PPO | Source: Ambulatory Visit | Attending: Nephrology | Admitting: Nephrology

## 2022-05-24 DIAGNOSIS — N189 Chronic kidney disease, unspecified: Secondary | ICD-10-CM | POA: Diagnosis not present

## 2022-05-24 MED ORDER — SODIUM CHLORIDE 0.9 % IV SOLN
510.0000 mg | INTRAVENOUS | Status: AC
  Administered 2022-05-24: 510 mg via INTRAVENOUS
  Filled 2022-05-24: qty 510

## 2022-06-07 ENCOUNTER — Encounter (HOSPITAL_COMMUNITY): Payer: Medicare PPO

## 2022-06-08 DIAGNOSIS — M84361K Stress fracture, right tibia, subsequent encounter for fracture with nonunion: Secondary | ICD-10-CM | POA: Diagnosis not present

## 2022-06-10 NOTE — Op Note (Signed)
NAME: Colleen Obrien, Colleen Obrien MEDICAL RECORD NO: [id number] ACCOUNT NO: [id number] DATE OF BIRTH: 03-16-1955 FACILITY: [facility] LOCATION: [facility] PHYSICIAN: Doralee Albino. Carola Frost, MD  Operative Report   DATE OF PROCEDURE: 05/03/2022  PREOPERATIVE DIAGNOSES:  Right tibia stress fracture.  POSTOPERATIVE DIAGNOSES:  Right tibia stress fracture.  PROCEDURES PERFORMED:  An intramedullary nailing of the right tibia using locking screws and a Synthes TFNA 10 x 330 mm statically locked.  SURGEON:  Myrene Galas, MD  ASSISTANT:  Montez Morita, PA-C.  ANESTHESIA:  General.  COMPLICATIONS:  None.  TOURNIQUET:  None.  PATIENT DISPOSITION:  To PACU.  CONDITION:  Stable.  BRIEF SUMMARY OF INDICATIONS FOR PROCEDURE:  [name] is a very pleasant 67 year old female seen and followed for a stress fracture of the right tibia by one of my orthopedic colleagues.  Because of her prolonged course and failure to heal, we  were asked to consult on the patient for further management.  I did discuss with her the risks and benefits of surgical treatment with intramedullary nailing, including the potential for persistent nonunion, malunion, DVT, PE, symptomatic hardware and  many others.  She acknowledged these risks and strongly wished to proceed providing consent to do so.  BRIEF SUMMARY OF PROCEDURE:  The patient was taken to the operating room where general anesthesia was induced.  The right lower extremity was prepped and draped in usual sterile fashion.  A timeout was held.  A 3 cm medial parapatellar retinacular  incision was made and appropriate starting position established just medial to the lateral tibial spine.  I then introduced a guidewire across the fracture site and down into the distal plafond, checking its position on orthogonal views.  I then placed 3  locking screws, two of them lateral on each side of the fracture site in an anterior to posterior direction and then another that was posterior  to the expected course of the nail, all to provide additional stability and reduction force.  I then began  sequential reaming and was able to pass the reamers up to 11 mm, but could not pass the nail without risk of fracture.  Consequently, I had to withdrawal the more distal anterior to posterior screw.  I then continued with passage of the nail securing  fixation with two static locks off the jig proximally, 2 perfect circle screws medial to lateral distally.  Improvement in the nonunion site alignment was observed.  All wounds were irrigated thoroughly and closed in standard layered fashion.  The  patient was taken to the PACU in stable condition.  Montez Morita was present and he assisted me throughout for this complex procedure.  PROGNOSIS:  The patient will be weightbearing as tolerated with unrestricted range of motion of the knee and ankle.  We will plan to see her back for removal of sutures in 10 days.  She is at increased risk for persistent nonunion and symptomatic  hardware.   MUK D: 06/10/2022 9:33:46 pm T: 06/10/2022 10:21:00 pm  JOB: [id number]

## 2022-06-13 DIAGNOSIS — E7800 Pure hypercholesterolemia, unspecified: Secondary | ICD-10-CM | POA: Diagnosis not present

## 2022-06-13 DIAGNOSIS — I10 Essential (primary) hypertension: Secondary | ICD-10-CM | POA: Diagnosis not present

## 2022-06-13 DIAGNOSIS — N185 Chronic kidney disease, stage 5: Secondary | ICD-10-CM | POA: Diagnosis not present

## 2022-06-13 DIAGNOSIS — J4520 Mild intermittent asthma, uncomplicated: Secondary | ICD-10-CM | POA: Diagnosis not present

## 2022-06-14 ENCOUNTER — Ambulatory Visit (HOSPITAL_COMMUNITY)
Admission: RE | Admit: 2022-06-14 | Discharge: 2022-06-14 | Disposition: A | Discharge status: Home / Self Care | Payer: Medicare PPO | Source: Ambulatory Visit | Attending: Nephrology | Admitting: Nephrology

## 2022-06-14 VITALS — BP 128/94 | HR 62 | Temp 97.5°F | Resp 18

## 2022-06-14 DIAGNOSIS — M109 Gout, unspecified: Secondary | ICD-10-CM | POA: Diagnosis not present

## 2022-06-14 DIAGNOSIS — N2581 Secondary hyperparathyroidism of renal origin: Secondary | ICD-10-CM | POA: Diagnosis not present

## 2022-06-14 DIAGNOSIS — N189 Chronic kidney disease, unspecified: Secondary | ICD-10-CM | POA: Insufficient documentation

## 2022-06-14 DIAGNOSIS — M3214 Glomerular disease in systemic lupus erythematosus: Secondary | ICD-10-CM | POA: Diagnosis not present

## 2022-06-14 DIAGNOSIS — N185 Chronic kidney disease, stage 5: Secondary | ICD-10-CM | POA: Diagnosis not present

## 2022-06-14 DIAGNOSIS — D631 Anemia in chronic kidney disease: Secondary | ICD-10-CM | POA: Diagnosis not present

## 2022-06-14 DIAGNOSIS — I120 Hypertensive chronic kidney disease with stage 5 chronic kidney disease or end stage renal disease: Secondary | ICD-10-CM | POA: Diagnosis not present

## 2022-06-14 LAB — IRON AND TIBC
Iron: 75 ug/dL (ref 28–170)
Saturation Ratios: 29 % (ref 10.4–31.8)
TIBC: 260 ug/dL (ref 250–450)
UIBC: 185 ug/dL

## 2022-06-14 LAB — POCT HEMOGLOBIN-HEMACUE: Hemoglobin: 10.4 g/dL — ABNORMAL LOW (ref 12.0–15.0)

## 2022-06-14 LAB — RENAL FUNCTION PANEL
Albumin: 3.7 g/dL (ref 3.5–5.0)
Anion gap: 7 (ref 5–15)
BUN: 64 mg/dL — ABNORMAL HIGH (ref 8–23)
CO2: 14 mmol/L — ABNORMAL LOW (ref 22–32)
Calcium: 10.1 mg/dL (ref 8.9–10.3)
Chloride: 116 mmol/L — ABNORMAL HIGH (ref 98–111)
Creatinine, Ser: 5.46 mg/dL — ABNORMAL HIGH (ref 0.44–1.00)
GFR, Estimated: 8 mL/min — ABNORMAL LOW (ref >60)
Glucose, Bld: 91 mg/dL (ref 70–99)
Phosphorus: 5.1 mg/dL — ABNORMAL HIGH (ref 2.5–4.6)
Potassium: 5.7 mmol/L — ABNORMAL HIGH (ref 3.5–5.1)
Sodium: 137 mmol/L (ref 135–145)

## 2022-06-14 LAB — URIC ACID: Uric Acid, Serum: 5.3 mg/dL (ref 2.5–7.1)

## 2022-06-14 MED ORDER — EPOETIN ALFA-EPBX 10000 UNIT/ML IJ SOLN
20000.0000 [IU] | INTRAMUSCULAR | Status: DC
  Administered 2022-06-14: 20000 [IU] via SUBCUTANEOUS

## 2022-06-14 MED ORDER — EPOETIN ALFA-EPBX 10000 UNIT/ML IJ SOLN
INTRAMUSCULAR | Status: AC
  Filled 2022-06-14: qty 2

## 2022-06-20 ENCOUNTER — Other Ambulatory Visit: Payer: Self-pay | Admitting: Family Medicine

## 2022-06-20 DIAGNOSIS — Z1231 Encounter for screening mammogram for malignant neoplasm of breast: Secondary | ICD-10-CM

## 2022-06-20 DIAGNOSIS — M1A09X1 Idiopathic chronic gout, multiple sites, with tophus (tophi): Secondary | ICD-10-CM | POA: Diagnosis not present

## 2022-06-22 DIAGNOSIS — N185 Chronic kidney disease, stage 5: Secondary | ICD-10-CM | POA: Diagnosis not present

## 2022-06-23 ENCOUNTER — Ambulatory Visit: Payer: Medicare PPO

## 2022-06-24 ENCOUNTER — Ambulatory Visit: Payer: Medicare PPO

## 2022-07-06 DIAGNOSIS — S82101K Unspecified fracture of upper end of right tibia, subsequent encounter for closed fracture with nonunion: Secondary | ICD-10-CM | POA: Diagnosis not present

## 2022-07-06 DIAGNOSIS — M25552 Pain in left hip: Secondary | ICD-10-CM | POA: Diagnosis not present

## 2022-07-06 DIAGNOSIS — M84361K Stress fracture, right tibia, subsequent encounter for fracture with nonunion: Secondary | ICD-10-CM | POA: Diagnosis not present

## 2022-07-12 ENCOUNTER — Ambulatory Visit (HOSPITAL_COMMUNITY)
Admission: RE | Admit: 2022-07-12 | Discharge: 2022-07-12 | Disposition: A | Discharge status: Home / Self Care | Payer: Medicare PPO | Source: Ambulatory Visit | Attending: Nephrology | Admitting: Nephrology

## 2022-07-12 VITALS — BP 135/88 | HR 62 | Temp 97.7°F | Resp 17

## 2022-07-12 DIAGNOSIS — N189 Chronic kidney disease, unspecified: Secondary | ICD-10-CM | POA: Diagnosis not present

## 2022-07-12 DIAGNOSIS — D631 Anemia in chronic kidney disease: Secondary | ICD-10-CM | POA: Diagnosis not present

## 2022-07-12 LAB — URIC ACID: Uric Acid, Serum: 6.4 mg/dL (ref 2.5–7.1)

## 2022-07-12 LAB — RENAL FUNCTION PANEL
Albumin: 3.6 g/dL (ref 3.5–5.0)
Anion gap: 7 (ref 5–15)
BUN: 55 mg/dL — ABNORMAL HIGH (ref 8–23)
CO2: 16 mmol/L — ABNORMAL LOW (ref 22–32)
Calcium: 10.4 mg/dL — ABNORMAL HIGH (ref 8.9–10.3)
Chloride: 111 mmol/L (ref 98–111)
Creatinine, Ser: 5.05 mg/dL — ABNORMAL HIGH (ref 0.44–1.00)
GFR, Estimated: 9 mL/min — ABNORMAL LOW (ref >60)
Glucose, Bld: 87 mg/dL (ref 70–99)
Phosphorus: 4.3 mg/dL (ref 2.5–4.6)
Potassium: 5.1 mmol/L (ref 3.5–5.1)
Sodium: 134 mmol/L — ABNORMAL LOW (ref 135–145)

## 2022-07-12 LAB — POCT HEMOGLOBIN-HEMACUE: Hemoglobin: 10.3 g/dL — ABNORMAL LOW (ref 12.0–15.0)

## 2022-07-12 LAB — IRON AND TIBC
Iron: 62 ug/dL (ref 28–170)
Saturation Ratios: 27 % (ref 10.4–31.8)
TIBC: 227 ug/dL — ABNORMAL LOW (ref 250–450)
UIBC: 165 ug/dL

## 2022-07-12 LAB — FERRITIN: Ferritin: 190 ng/mL (ref 11–307)

## 2022-07-12 MED ORDER — EPOETIN ALFA-EPBX 10000 UNIT/ML IJ SOLN: 20000.0000 [IU] | INTRAMUSCULAR | Status: DC

## 2022-07-12 MED ORDER — EPOETIN ALFA-EPBX 10000 UNIT/ML IJ SOLN
INTRAMUSCULAR | Status: AC
  Administered 2022-07-12: 20000 [IU] via SUBCUTANEOUS
  Filled 2022-07-12: qty 2

## 2022-07-13 ENCOUNTER — Ambulatory Visit
Admission: RE | Admit: 2022-07-13 | Discharge: 2022-07-13 | Disposition: A | Discharge status: Home / Self Care | Payer: Medicare PPO | Source: Ambulatory Visit | Attending: Family Medicine | Admitting: Family Medicine

## 2022-07-13 DIAGNOSIS — Z1231 Encounter for screening mammogram for malignant neoplasm of breast: Secondary | ICD-10-CM

## 2022-07-14 ENCOUNTER — Ambulatory Visit: Payer: Medicare PPO | Admitting: Physical Therapy

## 2022-07-14 DIAGNOSIS — I10 Essential (primary) hypertension: Secondary | ICD-10-CM | POA: Diagnosis not present

## 2022-07-14 DIAGNOSIS — G4733 Obstructive sleep apnea (adult) (pediatric): Secondary | ICD-10-CM | POA: Diagnosis not present

## 2022-07-14 DIAGNOSIS — E669 Obesity, unspecified: Secondary | ICD-10-CM | POA: Diagnosis not present

## 2022-07-18 NOTE — Therapy (Signed)
OUTPATIENT PHYSICAL THERAPY LOWER EXTREMITY EVALUATION   Patient Name: Colleen Obrien MRN: [id number] DOB:Jun 30, 1955, 67 y.o., female Today's Date: 07/20/2022  Referring diagnosis? Tibia fracture  Diagnosis  M79.661 (ICD-10-CM) - Pain in right lower leg   Treatment diagnosis? (if different than referring diagnosis) R26.2   M62.81   M25.561   R60.0 What was this (referring dx) caused by? [x]  Surgery [x]  Fall []  Ongoing issue []  Arthritis [x]  Other: ___tibial plateau fracture_________  Laterality: [x]  Rt []  Lt []  Both  Check all possible CPT codes:  *CHOOSE 10 OR LESS*    []  97110 (Therapeutic Exercise)  []  46962 (SLP Treatment)  []  97112 (Neuro Re-ed)   []  92526 (Swallowing Treatment)   []  97116 (Gait Training)   []  [id number] (Cognitive Training, 1st 15 minutes) []  97140 (Manual Therapy)   []  97130 (Cognitive Training, each add'l 15 minutes)  []  97164 (Re-evaluation)                              []  Other, List CPT Code ____________  []  97530 (Therapeutic Activities)     []  97535 (Self Care)   [x]  All codes above (97110 - 97535)  []  97012 (Mechanical Traction)  []  97014 (E-stim Unattended)  []  97032 (E-stim manual)  []  97033 (Ionto)  []  97035 (Ultrasound) []  97750 (Physical Performance Training) []  [id number] (Aquatic Therapy) [x]  97016 (Vasopneumatic Device) []  [id number] (Paraffin) []  97034 (Contrast Bath) []  97597 (Wound Care 1st 20 sq cm) []  97598 (Wound Care each add'l 20 sq cm) []  97760 (Orthotic Fabrication, Fitting, Training Initial) []  [id number] (Prosthetic Management and Training Initial) []  [id number] (Orthotic or Prosthetic Training/ Modification Subsequent)   END OF SESSION:  PT End of Session - 07/20/22 1628     Visit Number 1    Number of Visits 18    Date for PT Re-Evaluation 10/12/22    Authorization Type [employer]    Authorization - Visit Number 1    Progress Note Due on Visit 12    PT Start Time 1307    PT Stop Time 1345    PT Time Calculation (min) 38  min    Activity Tolerance Patient tolerated treatment well;No increased pain    Behavior During Therapy WFL for tasks assessed/performed             Past Medical History:  Diagnosis Date   Anemia in chronic kidney disease (CKD)    Arthritis    Asthma    Blood dyscrasia    systemic lupus   Chronic kidney disease (CKD), active medical management without dialysis, stage 4 (severe) (HCC)    Dyspnea    GERD (gastroesophageal reflux disease)    Gout    Hypertension    Lupus (HCC)    Narcolepsy    Osteoporosis 05/05/2022   Pneumonia    x 1   Sleep apnea    tries to use CPAP nightly as of 04/29/22   Walker as Engineer, water dependent    when going outside home   Past Surgical History:  Procedure Laterality Date   CESAREAN SECTION     x 2   COLONOSCOPY     ganglion cyst removed Right    arm   TIBIA IM NAIL INSERTION Right 05/03/2022   Procedure: INTRAMEDULLARY (IM) NAIL TIBIAL;  Surgeon: Myrene Galas, MD;  Location: [facility] OR;  Service: Orthopedics;  Laterality: Right;   TOTAL HIP ARTHROPLASTY Left 08/26/2019  Procedure: LEFT TOTAL HIP ARTHROPLASTY ANTERIOR APPROACH;  Surgeon: Tarry Kos, MD;  Location: [facility] OR;  Service: Orthopedics;  Laterality: Left;   TUBAL LIGATION     Patient Active Problem List   Diagnosis Date Noted   Osteoporosis 05/05/2022   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 05/03/2022   Anemia of chronic renal failure 03/08/2021   Chronic renal disease, stage IV (HCC) 03/08/2021   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   AVN (avascular necrosis of bone) (HCC) 07/08/2019   Diverticulosis of colon without hemorrhage 07/08/2019   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  Surgery in 05/03/2022.    SUBJECTIVE:   SUBJECTIVE STATEMENT: Colleen Obrien notes she has been using an assistive device since the original stumble in September 2023.  A "hairline fracture" never healed so she required surgery.  She notes she was non-weight-bearing 2-3 weeks after surgery.  Her follow-up with Dr. Carola Frost is July 12th.  PERTINENT HISTORY: CKD, arthritis, asthma, HTN, Lt THA PAIN:  Are you having pain? Yes: NPRS scale: 0-6/10 on a 10/10 Pain location: Right inferior and medial knee Pain description: Burning, achy Aggravating factors: Walking, prolonged postures, some pain at night Relieving factors: Change of position, ice, rest  PRECAUTIONS: None  WEIGHT BEARING RESTRICTIONS: No  FALLS:  Has patient fallen in last 6 months? 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 No immediate postoperative complication.  PATIENT SURVEYS:  FOTO 47 (risk-adjusted 45, Goal 60 in 15 visits)  COGNITION: Overall cognitive status: Within functional limits for tasks assessed     SENSATION: Burning of inferior and medial knee, occasional shocking to lateral 2 toes from the medial ankle  EDEMA:  Noted and not assssed secondary to starting 7 minutes late   LOWER EXTREMITY  ROM:  Active ROM Left/Right 07/20/2022   Hip flexion    Hip extension    Hip abduction    Hip adduction    Hip internal rotation    Hip external rotation    Knee flexion 130/132   Knee extension 0/0   Ankle dorsiflexion    Ankle plantarflexion    Ankle inversion    Ankle eversion     (Blank rows = not tested)  LOWER EXTREMITY STRENGTH:  MMT Left/Right 07/20/2022   Hip flexion    Hip extension    Hip abduction 4-/4-   Hip adduction    Hip internal rotation    Hip external rotation    Knee flexion    Knee extension 4/4-   Ankle dorsiflexion    Ankle plantarflexion    Ankle inversion    Ankle eversion     (  Blank rows = not tested)   GAIT: Distance walked: 100 feet Assistive device utilized: Walker - 2 wheeled Level of assistance: Modified independence Comments: Limp on the right side due to pain, quadriceps and hip abductors weakness   TODAY'S TREATMENT:                                                                                                                              DATE: 07/20/2022 Quadriceps sets 10 times seconds Side-lying clam with red Thera-Band resistance 10 x 3 seconds   PATIENT EDUCATION:  Education details: Reviewed exam findings and home exercise program Person educated: Patient Education method: Explanation, Demonstration, Tactile cues, Verbal cues, and Handouts Education comprehension: verbalized understanding, returned demonstration, verbal cues required, tactile cues required, and needs further education  HOME EXERCISE PROGRAM: [id number]  ASSESSMENT:  CLINICAL IMPRESSION: Patient is a 67 y.o. female who was seen today for physical therapy evaluation and treatment for s/p right tibia fracture.  Colleen Obrien tripped but did not fall back in September 2023.  She had a hairline fracture diagnosis at that time.  Because the fracture did not heal, she had to undergo surgery on April 2 of this year.  She has had a long period of time where she was  using an assistive device or was nonweightbearing dating back to her original injury.  As a result, she has significant weakness on the right side and atrophy is obvious of her entire right lower extremity.  Colleen Obrien will likely need a longer rehabilitation to regain strength, safely and appropriately transition off the walker so as not to further joint damage by transitioning her to quickly from an assistive device with significant quadriceps weakness.  Because she arrived a bit late today, I was only able to prescribe her to strengthening exercises focused on quadriceps and hip abductors.  These 2 muscle groups will be an early emphasis until she is able to transition into more appropriate weightbearing strengthening activities to eventually transition her to no assistive device.  Her prognosis is good to meet the below listed goals if she follows the outlined plan of care.  OBJECTIVE IMPAIRMENTS: Abnormal gait, decreased activity tolerance, decreased balance, decreased endurance, decreased knowledge of condition, difficulty walking, decreased strength, increased edema, impaired perceived functional ability, and pain.   ACTIVITY LIMITATIONS: carrying, lifting, bending, sitting, standing, squatting, stairs, transfers, and locomotion level  PARTICIPATION LIMITATIONS: cleaning, driving, shopping, and community activity  PERSONAL FACTORS: CKD, arthritis, asthma, HTN, Lt THA are also affecting patient's functional outcome.   REHAB POTENTIAL: Good  CLINICAL DECISION MAKING: Stable/uncomplicated  EVALUATION COMPLEXITY: Low   GOALS: Goals reviewed with patient? Yes  SHORT TERM GOALS: Target date: 08/17/2022 Colleen Obrien will be independent with her day 1 home exercise program Baseline: Started 07/20/2022 Goal status: INITIAL  2.  Colleen Obrien will safely transition to a cane without an increase in her right knee edema Baseline: Wheeled walker Goal status: INITIAL   LONG TERM GOALS: Target date:  10/12/2022  Colleen Obrien will report right knee pain consistently less than 3 out of 10 on the numeric pain rating scale Baseline: Can be as high as 6 out of 10 Goal status: INITIAL  2.  Improve FOTO to 60 Baseline: 47, risk adjusted 45 Goal status: INITIAL  3.  Improve bilateral quadriceps and hip abductor strength as assessed by MMT Baseline: See above Goal status: INITIAL  4.  Colleen Obrien will be able to safely transition off any assistive device without increased right knee edema Baseline: Wheeled walker Goal status: INITIAL  5.  Colleen Obrien will be independent with her long-term home exercise program at discharge Baseline: Started 07/20/2022 Goal status: INITIAL   PLAN:  PT FREQUENCY:  2-3 times a week to start with transition to 1-2 times per week  PT DURATION: 12 weeks  PLANNED INTERVENTIONS: Therapeutic exercises, Therapeutic activity, Neuromuscular re-education, Balance training, Gait training, Patient/Family education, Self Care, Stair training, Electrical stimulation, Cryotherapy, Vasopneumatic device, and Manual therapy  PLAN FOR NEXT SESSION: Review day 1 home exercises and add appropriate progressions for general lower extremity strengthening with emphasis on quadriceps and hip abductors.  Goal is to safely transition her from the walker to a cane to no assistive device, keeping in mind she has been using an assistive device for almost 9 months now and we want to protect the healing tibial plateau.   Cherlyn Cushing, PT, MPT 07/20/2022, 4:44 PM

## 2022-07-20 ENCOUNTER — Encounter: Payer: Self-pay | Admitting: Rehabilitative and Restorative Service Providers"

## 2022-07-20 ENCOUNTER — Ambulatory Visit (INDEPENDENT_AMBULATORY_CARE_PROVIDER_SITE_OTHER): Payer: Medicare PPO | Admitting: Rehabilitative and Restorative Service Providers"

## 2022-07-20 DIAGNOSIS — M25561 Pain in right knee: Secondary | ICD-10-CM | POA: Diagnosis not present

## 2022-07-20 DIAGNOSIS — M6281 Muscle weakness (generalized): Secondary | ICD-10-CM

## 2022-07-20 DIAGNOSIS — R262 Difficulty in walking, not elsewhere classified: Secondary | ICD-10-CM | POA: Diagnosis not present

## 2022-07-20 DIAGNOSIS — R600 Localized edema: Secondary | ICD-10-CM

## 2022-07-26 ENCOUNTER — Encounter (HOSPITAL_COMMUNITY): Payer: Medicare PPO

## 2022-07-29 ENCOUNTER — Encounter: Payer: Self-pay | Admitting: Rehabilitative and Restorative Service Providers"

## 2022-07-29 ENCOUNTER — Ambulatory Visit: Payer: Medicare PPO | Admitting: Rehabilitative and Restorative Service Providers"

## 2022-07-29 DIAGNOSIS — M25561 Pain in right knee: Secondary | ICD-10-CM | POA: Diagnosis not present

## 2022-07-29 DIAGNOSIS — M6281 Muscle weakness (generalized): Secondary | ICD-10-CM | POA: Diagnosis not present

## 2022-07-29 DIAGNOSIS — R262 Difficulty in walking, not elsewhere classified: Secondary | ICD-10-CM | POA: Diagnosis not present

## 2022-07-29 DIAGNOSIS — R600 Localized edema: Secondary | ICD-10-CM

## 2022-07-29 NOTE — Therapy (Addendum)
OUTPATIENT PHYSICAL THERAPY TREATMENT     Patient Name: Colleen Obrien MRN: [id number] DOB:Aug 27, 1955, 67 y.o., female Today's Date: 07/20/2022   Referring diagnosis? Tibia fracture  Diagnosis  M79.661 (ICD-10-CM) - Pain in right lower leg    Treatment diagnosis? (if different than referring diagnosis) R26.2   M62.81   M25.561   R60.0 What was this (referring dx) caused by? [x]  Surgery [x]  Fall []  Ongoing issue []  Arthritis [x]  Other: ___tibial plateau fracture_________   Laterality: [x]  Rt []  Lt []  Both   Check all possible CPT codes:             *CHOOSE 10 OR LESS*                          []  21308 (Therapeutic Exercise)             []  92507 (SLP Treatment)  []  97112 (Neuro Re-ed)                           []  92526 (Swallowing Treatment)             []  97116 (Gait Training)                           []  [id number] (Cognitive Training, 1st 15 minutes) []  97140 (Manual Therapy)                                []  97130 (Cognitive Training, each add'l 15 minutes)   []  97164 (Re-evaluation)                              []  Other, List CPT Code ____________  []  97530 (Therapeutic Activities)                                    []  97535 (Self Care)                      [x]  All codes above (97110 - 97535)            []  97012 (Mechanical Traction)            []  97014 (E-stim Unattended)            []  97032 (E-stim manual)            []  97033 (Ionto)            []  97035 (Ultrasound) []  97750 (Physical Performance Training) []  [id number] (Aquatic Therapy) [x]  97016 (Vasopneumatic Device) []  [id number] (Paraffin) []  97034 (Contrast Bath) []  97597 (Wound Care 1st 20 sq cm) []  97598 (Wound Care each add'l 20 sq cm) []  97760 (Orthotic Fabrication, Fitting, Training Initial) []  [id number] (Prosthetic Management and Training Initial) []  [id number] (Orthotic or Prosthetic Training/ Modification Subsequent)    END OF SESSION:   PT End of Session - 07/20/22 1628       Visit Number 2    Number of  Visits 18     Date for PT Re-Evaluation 10/12/22     Authorization Type [employer]     Authorization - Visit Number 2     Progress Note Due on Visit 12     PT  Start Time 1307     PT Stop Time 1345     PT Time Calculation (min) 38 min     Activity Tolerance Patient tolerated treatment well;No increased pain     Behavior During Therapy WFL for tasks assessed/performed                       Past Medical History:  Diagnosis Date   Anemia in chronic kidney disease (CKD)     Arthritis     Asthma     Blood dyscrasia      systemic lupus   Chronic kidney disease (CKD), active medical management without dialysis, stage 4 (severe) (HCC)     Dyspnea     GERD (gastroesophageal reflux disease)     Gout     Hypertension     Lupus (HCC)     Narcolepsy     Osteoporosis 05/05/2022   Pneumonia      x 1   Sleep apnea      tries to use CPAP nightly as of 04/29/22   Walker as Set designer dependent      when going outside home         Past Surgical History:  Procedure Laterality Date   CESAREAN SECTION        x 2   COLONOSCOPY       ganglion cyst removed Right      arm   TIBIA IM NAIL INSERTION Right 05/03/2022    Procedure: INTRAMEDULLARY (IM) NAIL TIBIAL;  Surgeon: Myrene Galas, MD;  Location: [facility] OR;  Service: Orthopedics;  Laterality: Right;   TOTAL HIP ARTHROPLASTY Left 08/26/2019    Procedure: LEFT TOTAL HIP ARTHROPLASTY ANTERIOR APPROACH;  Surgeon: Tarry Kos, MD;  Location: [facility];  Service: Orthopedics;  Laterality: Left;   TUBAL LIGATION            Patient Active Problem List    Diagnosis Date Noted   Osteoporosis 05/05/2022   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 05/03/2022   Anemia of chronic renal failure 03/08/2021   Chronic renal disease, stage IV (HCC) 03/08/2021   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   AVN (avascular necrosis of bone) (HCC) 07/08/2019   Diverticulosis of colon without hemorrhage 07/08/2019    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  Surgery in 05/03/2022.     SUBJECTIVE:    SUBJECTIVE STATEMENT: Tykerria notes a bit more pain this week with an increase in her weight-bearing.  She reports HEP compliance, although she did "miss some days."   Her follow-up with Dr. Carola Frost is July 12th.   PERTINENT HISTORY: CKD, arthritis, asthma, HTN, Lt THA PAIN:  Are you having pain? Yes: NPRS scale: 2-7/10 on a 10/10 Pain location: Right inferior and medial knee Pain description: Burning, achy Aggravating factors: Walking, prolonged postures, some pain at night Relieving factors: Change of position, ice, rest   PRECAUTIONS: None  WEIGHT BEARING RESTRICTIONS: No   FALLS:  Has patient fallen in last 6 months? 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 No immediate postoperative complication.   PATIENT SURVEYS:  Eval FOTO 47 (risk-adjusted 45, Goal 60 in 15 visits)   COGNITION: Overall cognitive status: Within  functional limits for tasks assessed                         SENSATION: Burning of inferior and medial knee, occasional shocking to lateral 2 toes from the medial ankle   EDEMA:  Noted and not assssed secondary to starting 7 minutes late     LOWER EXTREMITY ROM:   Active ROM Left/Right 07/20/2022    Hip flexion      Hip extension      Hip abduction      Hip adduction      Hip internal rotation      Hip external rotation      Knee flexion 130/132    Knee extension 0/0    Ankle dorsiflexion      Ankle plantarflexion      Ankle inversion      Ankle eversion       (Blank rows = not tested)   LOWER EXTREMITY STRENGTH:   MMT Left/Right 07/20/2022    Hip flexion      Hip extension      Hip abduction 4-/4-    Hip adduction      Hip internal rotation      Hip external rotation      Knee flexion      Knee extension 4/4-    Ankle dorsiflexion      Ankle plantarflexion      Ankle inversion      Ankle eversion       (Blank rows = not tested)     GAIT: Distance walked: 100 feet Assistive device utilized: Environmental consultant - 2 wheeled Level of assistance: Modified independence Comments: Limp on the right side due to pain, quadriceps and hip abductors weakness     TODAY'S TREATMENT:                                                                                                                              DATE:  07/29/2022 Nu Step Level 5 for 5 minutes Quadriceps sets 10 times 5 seconds Side-lying clam with red Thera-Band resistance 10 x 3 seconds Side-lying hip abduction 10 x 3 seconds bilateral (1/4 turn to  stomach) Seated straight leg raises 3 sets of 5 for 3 seconds  Neuromuscular re-education: Tandem balance eyes open 4 x 20 seconds each   07/20/2022 Quadriceps sets 10 times 5 seconds Side-lying clam with red Thera-Band resistance 10 x 3 seconds     PATIENT EDUCATION:  Education details: Reviewed exam findings and home exercise program Person educated: Patient Education  method: Explanation, Demonstration, Tactile cues, Verbal cues, and Handouts Education comprehension: verbalized understanding, returned demonstration, verbal cues required, tactile cues required, and needs further education   HOME EXERCISE PROGRAM: [id number]   ASSESSMENT:   CLINICAL IMPRESSION: Overa notes increased pain this week due to increased weight-bearing.  We reviewed the importance of appropriate rest to avoid overuse with weight-bearing, use of ice and her day 1 home exercises.  Progressions were made to quadriceps and hip abductor strengthening activities and these 2 muscle groups will be a focus of continued rehabilitation.  We will begin to gradually introduce more partial weight-bearing activities with eventual progression into weightbearing activities as strength, endurance and pain allow.   OBJECTIVE IMPAIRMENTS: Abnormal gait, decreased activity tolerance, decreased balance, decreased endurance, decreased knowledge of condition, difficulty walking, decreased strength, increased edema, impaired perceived functional ability, and pain.    ACTIVITY LIMITATIONS: carrying, lifting, bending, sitting, standing, squatting, stairs, transfers, and locomotion level   PARTICIPATION LIMITATIONS: cleaning, driving, shopping, and community activity   PERSONAL FACTORS: CKD, arthritis, asthma, HTN, Lt THA are also affecting patient's functional outcome.    REHAB POTENTIAL: Good   CLINICAL DECISION MAKING: Stable/uncomplicated   EVALUATION COMPLEXITY: Low     GOALS: Goals reviewed with patient? Yes   SHORT TERM GOALS: Target date: 08/17/2022 Forever will be independent with her day 1 home exercise program Baseline: Started 07/20/2022 Goal status: On Going 07/29/2022   2.  Jessye will safely transition to a cane without an increase in her right knee edema Baseline: Wheeled walker Goal status: On Going 07/29/2022     LONG TERM GOALS: Target date: 10/12/2022   Marixa will report  right knee pain consistently less than 3 out of 10 on the numeric pain rating scale Baseline: Can be as high as 6 out of 10 Goal status: INITIAL   2.  Improve FOTO to 60 Baseline: 47, risk adjusted 45 Goal status: INITIAL   3.  Improve bilateral quadriceps and hip abductor strength as assessed by MMT Baseline: See above Goal status: INITIAL   4.  Angalee will be able to safely transition off any assistive device without increased right knee edema Baseline: Wheeled walker Goal status: INITIAL   5.  Bexleigh will be independent with her long-term home exercise program at discharge Baseline: Started 07/20/2022 Goal status: INITIAL     PLAN:   PT FREQUENCY:  2-3 times a week to start with transition to 1-2 times per week   PT DURATION: 12 weeks   PLANNED INTERVENTIONS: Therapeutic exercises, Therapeutic activity, Neuromuscular re-education, Balance training, Gait training, Patient/Family education, Self Care, Stair training, Electrical stimulation, Cryotherapy, Vasopneumatic device, and Manual therapy   PLAN FOR NEXT SESSION: Review home exercises and add appropriate progressions for general lower extremity strengthening with emphasis on quadriceps and hip abductors.  Goal is to safely transition her from the walker to a cane to no assistive device, keeping in mind she has been using an assistive device for almost 9 months now and we want to protect the healing tibial plateau.     Cherlyn Cushing, PT, MPT 07/20/2022, 4:44 PM

## 2022-08-01 ENCOUNTER — Encounter: Payer: Medicare PPO | Admitting: Physical Therapy

## 2022-08-01 DIAGNOSIS — Z01818 Encounter for other preprocedural examination: Secondary | ICD-10-CM | POA: Diagnosis not present

## 2022-08-01 DIAGNOSIS — Z01810 Encounter for preprocedural cardiovascular examination: Secondary | ICD-10-CM | POA: Diagnosis not present

## 2022-08-01 DIAGNOSIS — R9439 Abnormal result of other cardiovascular function study: Secondary | ICD-10-CM | POA: Diagnosis not present

## 2022-08-01 DIAGNOSIS — N184 Chronic kidney disease, stage 4 (severe): Secondary | ICD-10-CM | POA: Diagnosis not present

## 2022-08-01 DIAGNOSIS — R935 Abnormal findings on diagnostic imaging of other abdominal regions, including retroperitoneum: Secondary | ICD-10-CM | POA: Diagnosis not present

## 2022-08-01 DIAGNOSIS — I129 Hypertensive chronic kidney disease with stage 1 through stage 4 chronic kidney disease, or unspecified chronic kidney disease: Secondary | ICD-10-CM | POA: Diagnosis not present

## 2022-08-03 ENCOUNTER — Encounter: Payer: Self-pay | Admitting: Rehabilitative and Restorative Service Providers"

## 2022-08-03 ENCOUNTER — Ambulatory Visit: Payer: Medicare PPO | Admitting: Rehabilitative and Restorative Service Providers"

## 2022-08-03 DIAGNOSIS — R262 Difficulty in walking, not elsewhere classified: Secondary | ICD-10-CM

## 2022-08-03 DIAGNOSIS — R600 Localized edema: Secondary | ICD-10-CM

## 2022-08-03 DIAGNOSIS — M25561 Pain in right knee: Secondary | ICD-10-CM | POA: Diagnosis not present

## 2022-08-03 DIAGNOSIS — M6281 Muscle weakness (generalized): Secondary | ICD-10-CM | POA: Diagnosis not present

## 2022-08-03 NOTE — Therapy (Addendum)
OUTPATIENT PHYSICAL THERAPY TREATMENT     Patient Name: Colleen Obrien MRN: [id number] DOB:08-03-1955, 67 y.o., female Today's Date: 07/20/2022   Referring diagnosis? Tibia fracture  Diagnosis  M79.661 (ICD-10-CM) - Pain in right lower leg    Treatment diagnosis? (if different than referring diagnosis) R26.2   M62.81   M25.561   R60.0 What was this (referring dx) caused by? [x]  Surgery [x]  Fall []  Ongoing issue []  Arthritis [x]  Other: ___tibial plateau fracture_________   Laterality: [x]  Rt []  Lt []  Both   Check all possible CPT codes:             *CHOOSE 10 OR LESS*                          []  97110 (Therapeutic Exercise)             []  92507 (SLP Treatment)  []  97112 (Neuro Re-ed)                           []  92526 (Swallowing Treatment)             []  97116 (Gait Training)                           []  [id number] (Cognitive Training, 1st 15 minutes) []  97140 (Manual Therapy)                                []  97130 (Cognitive Training, each add'l 15 minutes)   []  41324 (Re-evaluation)                              []  Other, List CPT Code ____________  []  97530 (Therapeutic Activities)                                    []  97535 (Self Care)                      [x]  All codes above (97110 - 97535)            []  97012 (Mechanical Traction)            []  97014 (E-stim Unattended)            []  97032 (E-stim manual)            []  97033 (Ionto)            []  97035 (Ultrasound) []  97750 (Physical Performance Training) []  [id number] (Aquatic Therapy) [x]  97016 (Vasopneumatic Device) []  [id number] (Paraffin) []  97034 (Contrast Bath) []  97597 (Wound Care 1st 20 sq cm) []  97598 (Wound Care each add'l 20 sq cm) []  97760 (Orthotic Fabrication, Fitting, Training Initial) []  [id number] (Prosthetic Management and Training Initial) []  [id number] (Orthotic or Prosthetic Training/ Modification Subsequent)    END OF SESSION:   PT End of Session - 07/20/22 1628       Visit Number 3     Number of  Visits 18     Date for PT Re-Evaluation 10/12/22     Authorization Type [employer]     Authorization - Visit Number 3     Progress Note Due on Visit 12  PT Start Time 1307     PT Stop Time 1345     PT Time Calculation (min) 38 min     Activity Tolerance Patient tolerated treatment well;No increased pain     Behavior During Therapy WFL for tasks assessed/performed                       Past Medical History:  Diagnosis Date   Anemia in chronic kidney disease (CKD)     Arthritis     Asthma     Blood dyscrasia      systemic lupus   Chronic kidney disease (CKD), active medical management without dialysis, stage 4 (severe) (HCC)     Dyspnea     GERD (gastroesophageal reflux disease)     Gout     Hypertension     Lupus (HCC)     Narcolepsy     Osteoporosis 05/05/2022   Pneumonia      x 1   Sleep apnea      tries to use CPAP nightly as of 04/29/22   Walker as Set designer dependent      when going outside home         Past Surgical History:  Procedure Laterality Date   CESAREAN SECTION        x 2   COLONOSCOPY       ganglion cyst removed Right      arm   TIBIA IM NAIL INSERTION Right 05/03/2022    Procedure: INTRAMEDULLARY (IM) NAIL TIBIAL;  Surgeon: Myrene Galas, MD;  Location: [facility] OR;  Service: Orthopedics;  Laterality: Right;   TOTAL HIP ARTHROPLASTY Left 08/26/2019    Procedure: LEFT TOTAL HIP ARTHROPLASTY ANTERIOR APPROACH;  Surgeon: Tarry Kos, MD;  Location: [facility] OR;  Service: Orthopedics;  Laterality: Left;   TUBAL LIGATION            Patient Active Problem List    Diagnosis Date Noted   Osteoporosis 05/05/2022   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 05/03/2022   Anemia of chronic renal failure 03/08/2021   Chronic renal disease, stage IV (HCC) 03/08/2021   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   AVN (avascular necrosis of bone) (HCC) 07/08/2019   Diverticulosis of colon without hemorrhage 07/08/2019    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  Surgery in 05/03/2022.     SUBJECTIVE:    SUBJECTIVE STATEMENT: Colleen Obrien notes some soreness with her left groin and top of the foot whereas the right sided soreness is more tibialis anterior.  She is taking tylenol every 6 hours.  HEP compliance has been a "C" this week.  Her follow-up with Dr. Carola Frost is July 12th.   PERTINENT HISTORY: CKD, arthritis, asthma, HTN, Lt THA PAIN:  Are you having pain? Yes: NPRS scale: 1-7/10 on a 10/10 this week Pain location: Right inferior and medial knee Pain description: Burning, achy Aggravating factors: Walking, prolonged  postures, some pain after prolonged weight-bearing, at night Relieving factors: Change of position, ice, rest   PRECAUTIONS: None   WEIGHT BEARING RESTRICTIONS: No   FALLS:  Has patient fallen in last 6 months? 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 No immediate postoperative complication.   PATIENT SURVEYS:  Eval  FOTO 47 (risk-adjusted 45, Goal 60 in 15 visits)   COGNITION: Overall cognitive status: Within functional limits for tasks assessed                         SENSATION: Burning of inferior and medial knee, occasional shocking to lateral 2 toes from the medial ankle   EDEMA:  Noted and not assssed secondary to starting 7 minutes late     LOWER EXTREMITY ROM:   Active ROM Left/Right 07/20/2022    Hip flexion      Hip extension      Hip abduction      Hip adduction      Hip internal rotation      Hip external rotation      Knee flexion 130/132    Knee extension 0/0    Ankle dorsiflexion      Ankle plantarflexion      Ankle inversion      Ankle eversion       (Blank rows = not tested)   LOWER EXTREMITY STRENGTH:   MMT Left/Right 07/20/2022    Hip flexion      Hip extension      Hip abduction 4-/4-    Hip adduction      Hip internal rotation      Hip external rotation      Knee flexion      Knee extension 4/4-    Ankle dorsiflexion      Ankle plantarflexion      Ankle inversion      Ankle eversion       (Blank rows = not tested)     GAIT: Distance walked: 100 feet Assistive device utilized: Environmental consultant - 2 wheeled Level of assistance: Modified independence Comments: Limp on the right side due to pain, quadriceps and hip abductors weakness     TODAY'S TREATMENT:                                                                                                                              DATE:  08/03/2022 Seated straight leg raises 3 sets of 5 for 3 seconds (needs correction  on technique to pause before lifting and keep quads contracted on the slow eccentrics) Heel to toe raises standing in door frame 10 x 3 seconds Side lie hip abduction (lie left, lift right) 2 sets of 10 slow eccentrics (1/4 turn to stomach) Side lie clams (lie left, lift right) Red Thera-band 2 sets of 10, 3 second hold, slow eccentrics Quadriceps sets 10 x 5 seconds   07/29/2022 Nu Step Level 5 for 5  minutes Quadriceps sets 10 times 5 seconds Side-lying clam with red Thera-Band resistance 10 x 3 seconds Side-lying hip abduction 10 x 3 seconds bilateral (1/4 turn to stomach) Seated straight leg raises 3 sets of 5 for 3 seconds  Neuromuscular re-education: Tandem balance eyes open 4 x 20 seconds each   07/20/2022 Quadriceps sets 10 times 5 seconds Side-lying clam with red Thera-Band resistance 10 x 3 seconds     PATIENT EDUCATION:  Education details: Reviewed exam findings and home exercise program Person educated: Patient Education method: Explanation, Demonstration, Tactile cues, Verbal cues, and Handouts Education comprehension: verbalized understanding, returned demonstration, verbal cues required, tactile cues required, and needs further education   HOME EXERCISE PROGRAM: [id number]   ASSESSMENT:   CLINICAL IMPRESSION: Colleen Obrien notes bilateral lower extremity soreness with exercises and weight-bearing.  Importantly, Colleen Obrien reports no knee pain with exercises.  We reviewed her home exercises, reminded her to avoid overuse with weight-bearing and encouraged her to use ice as needed to help with soreness and recovery.  Quadriceps and hip abductors strengthening remain a focus of continued rehabilitation.  We will begin to gradually introduce more partial weight-bearing activities with eventual progression into weightbearing activities as strength, endurance and pain allow.   OBJECTIVE IMPAIRMENTS: Abnormal gait, decreased activity tolerance, decreased balance, decreased endurance, decreased knowledge of condition, difficulty walking, decreased strength, increased edema, impaired perceived functional ability, and pain.    ACTIVITY LIMITATIONS: carrying, lifting, bending, sitting, standing, squatting, stairs, transfers, and locomotion level   PARTICIPATION LIMITATIONS: cleaning, driving, shopping, and community activity   PERSONAL FACTORS: CKD, arthritis, asthma, HTN, Lt THA are  also affecting patient's functional outcome.    REHAB POTENTIAL: Good   CLINICAL DECISION MAKING: Stable/uncomplicated   EVALUATION COMPLEXITY: Low     GOALS: Goals reviewed with patient? Yes   SHORT TERM GOALS: Target date: 08/17/2022 Colleen Obrien will be independent with her day 1 home exercise program Baseline: Started 07/20/2022 Goal status: On Going 08/03/2022   2.  Colleen Obrien will safely transition to a cane without an increase in her right knee edema Baseline: Wheeled walker Goal status: On Going 08/03/2022     LONG TERM GOALS: Target date: 10/12/2022   Colleen Obrien will report right knee pain consistently less than 3 out of 10 on the numeric pain rating scale Baseline: Can be as high as 6 out of 10 Goal status: On Going 08/03/2022   2.  Improve FOTO to 60 Baseline: 47, risk adjusted 45 Goal status: INITIAL   3.  Improve bilateral quadriceps and hip abductor strength as assessed by MMT Baseline: See above Goal status: INITIAL   4.  Colleen Obrien will be able to safely transition off any assistive device without increased right knee edema Baseline: Wheeled walker Goal status: On Going 08/03/2022   5.  Colleen Obrien will be independent with her long-term home exercise program at discharge Baseline: Started 07/20/2022 Goal status: INITIAL     PLAN:   PT FREQUENCY:  2-3 times a week to start with transition to 1-2 times per week   PT  DURATION: 12 weeks   PLANNED INTERVENTIONS: Therapeutic exercises, Therapeutic activity, Neuromuscular re-education, Balance training, Gait training, Patient/Family education, Self Care, Stair training, Electrical stimulation, Cryotherapy, Vasopneumatic device, and Manual therapy   PLAN FOR NEXT SESSION: Review home exercises and add appropriate progressions for general lower extremity strengthening with emphasis on quadriceps and hip abductors strength.  Goal is to safely transition her from the walker to a cane to no assistive device, keeping in mind she has been  using an assistive device for almost 9 months now and we want to protect the healing tibial plateau.     Cherlyn Cushing, PT, MPT 07/20/2022, 4:44 PM

## 2022-08-08 DIAGNOSIS — M1A09X1 Idiopathic chronic gout, multiple sites, with tophus (tophi): Secondary | ICD-10-CM | POA: Diagnosis not present

## 2022-08-09 ENCOUNTER — Ambulatory Visit (HOSPITAL_COMMUNITY)
Admission: RE | Admit: 2022-08-09 | Discharge: 2022-08-09 | Disposition: A | Discharge status: Home / Self Care | Payer: Medicare PPO | Source: Ambulatory Visit | Attending: Nephrology | Admitting: Nephrology

## 2022-08-09 VITALS — BP 126/81 | HR 63 | Temp 97.9°F | Resp 17

## 2022-08-09 DIAGNOSIS — D631 Anemia in chronic kidney disease: Secondary | ICD-10-CM | POA: Diagnosis not present

## 2022-08-09 DIAGNOSIS — N189 Chronic kidney disease, unspecified: Secondary | ICD-10-CM | POA: Diagnosis not present

## 2022-08-09 LAB — IRON AND TIBC
Iron: 63 ug/dL (ref 28–170)
Saturation Ratios: 27 % (ref 10.4–31.8)
TIBC: 237 ug/dL — ABNORMAL LOW (ref 250–450)
UIBC: 174 ug/dL

## 2022-08-09 LAB — POCT HEMOGLOBIN-HEMACUE: Hemoglobin: 10.5 g/dL — ABNORMAL LOW (ref 12.0–15.0)

## 2022-08-09 LAB — URIC ACID: Uric Acid, Serum: 5.7 mg/dL (ref 2.5–7.1)

## 2022-08-09 LAB — RENAL FUNCTION PANEL
Albumin: 3.7 g/dL (ref 3.5–5.0)
Anion gap: 9 (ref 5–15)
BUN: 74 mg/dL — ABNORMAL HIGH (ref 8–23)
CO2: 16 mmol/L — ABNORMAL LOW (ref 22–32)
Calcium: 11 mg/dL — ABNORMAL HIGH (ref 8.9–10.3)
Chloride: 109 mmol/L (ref 98–111)
Creatinine, Ser: 5.38 mg/dL — ABNORMAL HIGH (ref 0.44–1.00)
GFR, Estimated: 8 mL/min — ABNORMAL LOW (ref >60)
Glucose, Bld: 98 mg/dL (ref 70–99)
Phosphorus: 4.8 mg/dL — ABNORMAL HIGH (ref 2.5–4.6)
Potassium: 4.8 mmol/L (ref 3.5–5.1)
Sodium: 134 mmol/L — ABNORMAL LOW (ref 135–145)

## 2022-08-09 MED ORDER — EPOETIN ALFA-EPBX 10000 UNIT/ML IJ SOLN
INTRAMUSCULAR | Status: AC
  Filled 2022-08-09: qty 2

## 2022-08-09 MED ORDER — EPOETIN ALFA-EPBX 10000 UNIT/ML IJ SOLN
20000.0000 [IU] | INTRAMUSCULAR | Status: DC
  Administered 2022-08-09: 20000 [IU] via SUBCUTANEOUS

## 2022-08-10 ENCOUNTER — Ambulatory Visit: Payer: Medicare PPO | Admitting: Rehabilitative and Restorative Service Providers"

## 2022-08-10 ENCOUNTER — Encounter: Payer: Medicare PPO | Admitting: Physical Therapy

## 2022-08-10 ENCOUNTER — Encounter: Payer: Self-pay | Admitting: Rehabilitative and Restorative Service Providers"

## 2022-08-10 DIAGNOSIS — M6281 Muscle weakness (generalized): Secondary | ICD-10-CM | POA: Diagnosis not present

## 2022-08-10 DIAGNOSIS — R600 Localized edema: Secondary | ICD-10-CM | POA: Diagnosis not present

## 2022-08-10 DIAGNOSIS — R262 Difficulty in walking, not elsewhere classified: Secondary | ICD-10-CM

## 2022-08-10 DIAGNOSIS — M25561 Pain in right knee: Secondary | ICD-10-CM

## 2022-08-10 LAB — PTH, INTACT AND CALCIUM
Calcium, Total (PTH): 11.1 mg/dL — ABNORMAL HIGH (ref 8.7–10.3)
PTH: 678 pg/mL — ABNORMAL HIGH (ref 15–65)

## 2022-08-10 NOTE — Therapy (Signed)
OUTPATIENT PHYSICAL THERAPY TREATMENT     Patient Name: Colleen Obrien MRN: [id number] DOB:[birth year], 67 y.o., female Today's Date: 07/20/2022    PT End of Session - 08/10/22 1427     Visit Number 4    Number of Visits 18    Date for PT Re-Evaluation 10/12/22    Authorization Type [employer]    Authorization Time Period - 10/12/2022    Authorization - Visit Number 4    Authorization - Number of Visits 12    Progress Note Due on Visit 12    PT Start Time 1427    PT Stop Time 1506    PT Time Calculation (min) 39 min    Activity Tolerance Patient tolerated treatment well    Behavior During Therapy WFL for tasks assessed/performed                     Past Medical History:  Diagnosis Date   Anemia in chronic kidney disease (CKD)     Arthritis     Asthma     Blood dyscrasia      systemic lupus   Chronic kidney disease (CKD), active medical management without dialysis, stage 4 (severe) (HCC)     Dyspnea     GERD (gastroesophageal reflux disease)     Gout     Hypertension     Lupus (HCC)     Narcolepsy     Osteoporosis 05/05/2022   Pneumonia      x 1   Sleep apnea      tries to use CPAP nightly as of 04/29/22   Walker as Set designer dependent      when going outside home         Past Surgical History:  Procedure Laterality Date   CESAREAN SECTION        x 2   COLONOSCOPY       ganglion cyst removed Right      arm   TIBIA IM NAIL INSERTION Right 05/03/2022    Procedure: INTRAMEDULLARY (IM) NAIL TIBIAL;  Surgeon: Myrene Galas, MD;  Location: [facility] OR;  Service: Orthopedics;  Laterality: Right;   TOTAL HIP ARTHROPLASTY Left 08/26/2019    Procedure: LEFT TOTAL HIP ARTHROPLASTY ANTERIOR APPROACH;  Surgeon: Tarry Kos, MD;  Location: [facility] OR;  Service: Orthopedics;  Laterality: Left;   TUBAL LIGATION            Patient Active Problem List    Diagnosis Date Noted   Osteoporosis 05/05/2022   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 05/03/2022   Anemia of chronic renal failure 03/08/2021   Chronic renal disease, stage IV (HCC) 03/08/2021   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   AVN (avascular necrosis of bone) (HCC) 07/08/2019   Diverticulosis of colon without hemorrhage 07/08/2019   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  Surgery in 05/03/2022.     SUBJECTIVE:    SUBJECTIVE  STATEMENT: She indicated about the same overall pain in proximal tibial region on Rt.   She indicated using the cane a little more.      PERTINENT HISTORY: CKD, arthritis, asthma, HTN, Lt THA PAIN:  NPRS scale: 2/10 Pain location: Right inferior and medial knee Pain description: Burning, achy Aggravating factors: Walking, prolonged postures, some pain after prolonged weight-bearing, at night Relieving factors: Change of position, ice, rest   PRECAUTIONS: None   WEIGHT BEARING RESTRICTIONS: No   FALLS:  Has patient fallen in last 6 months? 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 No immediate postoperative complication.   PATIENT SURVEYS:  07/20/2022 Eval FOTO 47 (risk-adjusted 45, Goal 60 in 15 visits)   COGNITION: 07/20/2022 Overall cognitive status: Within functional limits for tasks assessed                         SENSATION: 07/20/2022 Burning of inferior and medial knee, occasional shocking to lateral 2 toes from the medial ankle   EDEMA:  07/20/2022 Noted and not assssed secondary to starting 7 minutes late     LOWER EXTREMITY ROM:   Active ROM Left/Right 07/20/2022    Hip flexion      Hip extension      Hip abduction      Hip adduction      Hip internal rotation      Hip external rotation      Knee flexion 130/132    Knee extension 0/0    Ankle dorsiflexion      Ankle plantarflexion      Ankle inversion      Ankle eversion       (Blank rows = not tested)   LOWER EXTREMITY STRENGTH:   MMT Left/Right 07/20/2022    Hip flexion      Hip extension      Hip abduction 4-/4-    Hip adduction      Hip internal rotation      Hip external rotation      Knee flexion      Knee extension 4/4-    Ankle dorsiflexion      Ankle plantarflexion      Ankle inversion      Ankle eversion       (Blank rows = not tested)     GAIT: 08/10/2022:   SPC use in clinic (in Lt hand) with variable step to and step through gait pattern.   07/20/2022 Distance walked: 100 feet Assistive device utilized: Environmental consultant - 2 wheeled Level of assistance: Modified independence Comments: Limp on the right side due to pain, quadriceps and hip abductors weakness                     TODAY'S TREATMENT:  DATE: 08/10/2022 TherEx Nustep lvl 6 8 mins UE/LE  Step up 4 inch with single hand on rail x 10 bilateral for strength building Sit to stand to sit slowly lowering 19 inch table height x 10 for strength building  Review of seated SLR per Pt  reporting difficulty but not pain.  Seated Rt SLR c quad set 2 x 5   Neuro Re-ed Tandem stance 1 min x 1 bilateral c occasional HHA required in // bars.   TherActivity SPC use in clinic household distances between activity c supervision/SBA.  Performed ascending/descending ramp and 6 inch step training with cues for sequencing.  Step performed with Lt leg WB loading for ease.  Performed 30 ft, 60 ft, 30 ft, 60 ft   TODAY'S TREATMENT:                                                              DATE: 08/03/2022 Seated straight leg raises 3 sets of 5 for 3 seconds (needs correction on technique to pause before lifting and keep quads contracted on the slow eccentrics) Heel to toe raises standing in door frame 10 x 3 seconds Side lie hip abduction (lie left, lift right) 2 sets of 10 slow eccentrics (1/4 turn to stomach) Side lie clams (lie left, lift right) Red Thera-band 2 sets of 10, 3 second hold, slow eccentrics Quadriceps sets 10 x 5 seconds   07/29/2022 Nu Step Level 5 for 5 minutes Quadriceps sets 10 times 5 seconds Side-lying clam with red Thera-Band resistance 10 x 3 seconds Side-lying hip abduction 10 x 3 seconds bilateral (1/4 turn to stomach) Seated straight leg raises 3 sets of 5 for 3 seconds  Neuromuscular re-education: Tandem balance eyes open 4 x 20 seconds each   07/20/2022 Quadriceps sets 10 times 5 seconds Side-lying clam with red Thera-Band resistance 10 x 3 seconds     PATIENT EDUCATION:  07/20/2022 Education details: Reviewed exam findings and home exercise program Person educated: Patient Education method: Explanation, Demonstration, Tactile cues, Verbal cues, and Handouts Education comprehension: verbalized understanding, returned demonstration, verbal cues required, tactile cues required, and needs further education   HOME EXERCISE PROGRAM: [id number]   ASSESSMENT:   CLINICAL IMPRESSION: SPC use in clinic on level surfaces was adequate for recommendation  for use at home.  Cues for use in Lt UE was given to perform reciprocal gait pattern appropriately with SPC.  Recommend continued skilled PT services to promote improve strength bilateral.     OBJECTIVE IMPAIRMENTS: Abnormal gait, decreased activity tolerance, decreased balance, decreased endurance, decreased knowledge of condition, difficulty walking, decreased strength, increased edema, impaired perceived functional ability, and pain.    ACTIVITY LIMITATIONS: carrying, lifting, bending, sitting, standing, squatting, stairs, transfers, and locomotion level   PARTICIPATION LIMITATIONS: cleaning, driving, shopping, and community activity   PERSONAL FACTORS: CKD, arthritis, asthma, HTN, Lt THA are also affecting patient's functional outcome.    REHAB POTENTIAL: Good   CLINICAL DECISION MAKING: Stable/uncomplicated   EVALUATION COMPLEXITY: Low     GOALS: Goals reviewed with patient? Yes   SHORT TERM GOALS: Target date: 08/17/2022 Jeniece will be independent with her day 1 home exercise program Baseline: Started 07/20/2022 Goal status: On Going 08/03/2022   2.  Kemiya will safely transition to a cane without an increase  in her right knee edema Baseline: Wheeled walker Goal status: On Going 08/03/2022     LONG TERM GOALS: Target date: 10/12/2022   Day will report right knee pain consistently less than 3 out of 10 on the numeric pain rating scale Baseline: Can be as high as 6 out of 10 Goal status: On Going 08/03/2022   2.  Improve FOTO to 60 Baseline: 47, risk adjusted 45 Goal status: INITIAL   3.  Improve bilateral quadriceps and hip abductor strength as assessed by MMT Baseline: See above Goal status: INITIAL   4.  Annalysa will be able to safely transition off any assistive device without increased right knee edema Baseline: Wheeled walker Goal status: On Going 08/03/2022   5.  Ziyanna will be independent with her long-term home exercise program at discharge Baseline: Started  07/20/2022 Goal status: INITIAL     PLAN:   PT FREQUENCY:  2-3 times a week to start with transition to 1-2 times per week   PT DURATION: 12 weeks   PLANNED INTERVENTIONS: Therapeutic exercises, Therapeutic activity, Neuromuscular re-education, Balance training, Gait training, Patient/Family education, Self Care, Stair training, Electrical stimulation, Cryotherapy, Vasopneumatic device, and Manual therapy   PLAN FOR NEXT SESSION: Continued SPC use in clinic, progressive strengthening and balance improvements.    Chyrel Masson, PT, DPT, OCS, ATC 08/10/22  3:03 PM

## 2022-08-12 ENCOUNTER — Encounter: Payer: Medicare PPO | Admitting: Physical Therapy

## 2022-08-12 DIAGNOSIS — N185 Chronic kidney disease, stage 5: Secondary | ICD-10-CM | POA: Diagnosis not present

## 2022-08-12 DIAGNOSIS — M3214 Glomerular disease in systemic lupus erythematosus: Secondary | ICD-10-CM | POA: Diagnosis not present

## 2022-08-12 DIAGNOSIS — I120 Hypertensive chronic kidney disease with stage 5 chronic kidney disease or end stage renal disease: Secondary | ICD-10-CM | POA: Diagnosis not present

## 2022-08-12 DIAGNOSIS — M109 Gout, unspecified: Secondary | ICD-10-CM | POA: Diagnosis not present

## 2022-08-12 DIAGNOSIS — N2581 Secondary hyperparathyroidism of renal origin: Secondary | ICD-10-CM | POA: Diagnosis not present

## 2022-08-17 ENCOUNTER — Ambulatory Visit: Payer: Medicare PPO | Admitting: Rehabilitative and Restorative Service Providers"

## 2022-08-17 ENCOUNTER — Encounter: Payer: Self-pay | Admitting: Rehabilitative and Restorative Service Providers"

## 2022-08-17 DIAGNOSIS — R262 Difficulty in walking, not elsewhere classified: Secondary | ICD-10-CM

## 2022-08-17 DIAGNOSIS — M25561 Pain in right knee: Secondary | ICD-10-CM | POA: Diagnosis not present

## 2022-08-17 DIAGNOSIS — M84361K Stress fracture, right tibia, subsequent encounter for fracture with nonunion: Secondary | ICD-10-CM | POA: Diagnosis not present

## 2022-08-17 DIAGNOSIS — R600 Localized edema: Secondary | ICD-10-CM

## 2022-08-17 DIAGNOSIS — M6281 Muscle weakness (generalized): Secondary | ICD-10-CM

## 2022-08-17 NOTE — Therapy (Signed)
OUTPATIENT PHYSICAL THERAPY TREATMENT     Patient Name: Colleen Obrien MRN: [id number] DOB:Aug 31, 1955, 67 y.o., female Today's Date: 07/20/2022    PT End of Session - 08/17/22 1404     Visit Number 5    Number of Visits 18    Date for PT Re-Evaluation 10/12/22    Authorization Type [employer]    Authorization Time Period - 10/12/2022    Authorization - Visit Number 5    Authorization - Number of Visits 12    Progress Note Due on Visit 12    PT Start Time 1347    PT Stop Time 1428    PT Time Calculation (min) 41 min    Activity Tolerance Patient tolerated treatment well    Behavior During Therapy WFL for tasks assessed/performed                      Past Medical History:  Diagnosis Date   Anemia in chronic kidney disease (CKD)     Arthritis     Asthma     Blood dyscrasia      systemic lupus   Chronic kidney disease (CKD), active medical management without dialysis, stage 4 (severe) (HCC)     Dyspnea     GERD (gastroesophageal reflux disease)     Gout     Hypertension     Lupus (HCC)     Narcolepsy     Osteoporosis 05/05/2022   Pneumonia      x 1   Sleep apnea      tries to use CPAP nightly as of 04/29/22   Walker as Set designer dependent      when going outside home         Past Surgical History:  Procedure Laterality Date   CESAREAN SECTION        x 2   COLONOSCOPY       ganglion cyst removed Right      arm   TIBIA IM NAIL INSERTION Right 05/03/2022    Procedure: INTRAMEDULLARY (IM) NAIL TIBIAL;  Surgeon: Myrene Galas, MD;  Location: [facility] OR;  Service: Orthopedics;  Laterality: Right;   TOTAL HIP ARTHROPLASTY Left 08/26/2019    Procedure: LEFT TOTAL HIP ARTHROPLASTY ANTERIOR APPROACH;  Surgeon: Tarry Kos, MD;  Location: [facility] OR;  Service: Orthopedics;  Laterality: Left;   TUBAL LIGATION            Patient Active Problem List    Diagnosis Date Noted   Osteoporosis 05/05/2022   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 05/03/2022   Anemia of chronic renal failure 03/08/2021   Chronic renal disease, stage IV (HCC) 03/08/2021   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   AVN (avascular necrosis of bone) (HCC) 07/08/2019   Diverticulosis of colon without hemorrhage 07/08/2019   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  Surgery in 05/03/2022.     SUBJECTIVE:  SUBJECTIVE STATEMENT: She indicated feeling a little 2/10 in shin upon arrival today.  Using cane again and sometimes walking short distances without cane.   Saw Dr. Carola Frost today with good report about bone healing.    PERTINENT HISTORY: CKD, arthritis, asthma, HTN, Lt THA  PAIN:  NPRS scale: 2/10 Pain location: Right inferior and medial knee Pain description: Burning, achy Aggravating factors: Walking, prolonged postures, some pain after prolonged weight-bearing, at night Relieving factors: Change of position, ice, rest   PRECAUTIONS: None   WEIGHT BEARING RESTRICTIONS: No   FALLS:  Has patient fallen in last 6 months? 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 No immediate postoperative complication.   PATIENT SURVEYS:  08/17/2022:  FOTO 57  07/20/2022 Eval FOTO 47 (risk-adjusted 45, Goal 60 in 15 visits)   COGNITION: 07/20/2022 Overall cognitive status: Within functional limits for tasks assessed                         SENSATION: 07/20/2022 Burning of inferior and medial knee, occasional shocking to lateral 2 toes from the medial ankle   EDEMA:  07/20/2022 Noted and not assssed secondary to starting 7 minutes late     LOWER EXTREMITY ROM:   Active ROM Left/Right 07/20/2022    Hip flexion      Hip extension      Hip abduction      Hip adduction      Hip internal rotation      Hip external rotation      Knee flexion 130/132    Knee extension 0/0    Ankle dorsiflexion      Ankle plantarflexion      Ankle inversion      Ankle eversion       (Blank rows = not tested)   LOWER EXTREMITY STRENGTH:   MMT Left/Right 07/20/2022  Right 08/17/2022 Left 08/17/2022  Hip flexion    5/5 5/5  Hip extension       Hip abduction 4-/4-     Hip adduction       Hip internal rotation       Hip external rotation       Knee flexion    5/5 5/5  Knee extension 4/4- 4/5 4+/5  Ankle dorsiflexion       Ankle plantarflexion       Ankle inversion       Ankle eversion        (Blank rows = not tested)     GAIT: 08/10/2022:   SPC use in clinic (in Lt hand) with variable step to and step through gait pattern.   07/20/2022 Distance walked: 100 feet Assistive device utilized: Environmental consultant - 2 wheeled Level of assistance: Modified independence Comments: Limp on the right side due to pain, quadriceps and hip abductors weakness                     TODAY'S TREATMENT:  DATE: 08/17/2022 TherEx Nustep lvl 6 8 mins UE/LE  Sit to stand to sit slowly lowering 19 inch  table height x 15 for strength building Seated SLR c quad set 2 x 10 , performed bilaterally Leg press Double leg x 15 50 lbs, single leg 25 lbs x 15 bilaterally   Neuro Re-ed Tandem stance 1 min x 1 bilateral c occasional HHA required in // bars.  Alternating PF/DF in // bars with occasional HHA x 15 for ankle strategy improvements Feet together stance on foam eyes open 1 min with a few HHA to correct loss of balance - SBA Feet together stance on foam eyes open with head turns Lt and Rt x 10 - SBA   TODAY'S TREATMENT:                                                              DATE: 08/10/2022 TherEx Nustep lvl 6 8 mins UE/LE  Step up 4 inch with single hand on rail x 10 bilateral for strength building Sit to stand to sit slowly lowering 19 inch table height x 10 for strength building  Review of seated SLR per Pt reporting difficulty but not pain.  Seated Rt SLR c quad set 2 x 5   Neuro Re-ed Tandem stance 1 min x 1 bilateral c occasional HHA required in // bars.   TherActivity SPC use in clinic household distances between activity c supervision/SBA.  Performed ascending/descending ramp and 6 inch step training with cues for sequencing.  Step performed with Lt leg WB loading for ease.  Performed 30 ft, 60 ft, 30 ft, 60 ft   TODAY'S TREATMENT:                                                              DATE: 08/03/2022 Seated straight leg raises 3 sets of 5 for 3 seconds (needs correction on technique to pause before lifting and keep quads contracted on the slow eccentrics) Heel to toe raises standing in door frame 10 x 3 seconds Side lie hip abduction (lie left, lift right) 2 sets of 10 slow eccentrics (1/4 turn to stomach) Side lie clams (lie left, lift right) Red Thera-band 2 sets of 10, 3 second hold, slow eccentrics Quadriceps sets 10 x 5 seconds   07/29/2022 Nu Step Level 5 for 5 minutes Quadriceps sets 10 times 5 seconds Side-lying clam with red Thera-Band resistance 10 x 3  seconds Side-lying hip abduction 10 x 3 seconds bilateral (1/4 turn to stomach) Seated straight leg raises 3 sets of 5 for 3 seconds  Neuromuscular re-education: Tandem balance eyes open 4 x 20 seconds each   07/20/2022 Quadriceps sets 10 times 5 seconds Side-lying clam with red Thera-Band resistance 10 x 3 seconds     PATIENT EDUCATION:  07/20/2022 Education details: Reviewed exam findings and home exercise program Person educated: Patient Education method: Explanation, Demonstration, Tactile cues, Verbal cues, and Handouts Education comprehension: verbalized understanding, returned demonstration, verbal cues required, tactile cues required, and needs further education   HOME EXERCISE PROGRAM: [id number]   ASSESSMENT:  CLINICAL IMPRESSION: FOTO showed improvement as compared to evaluation. Pt to benefit from continued LE strengthening bilaterally to improve functional movement tolerance as well as movement coordination improvements for transitioning towards independent gait.      OBJECTIVE IMPAIRMENTS: Abnormal gait, decreased activity tolerance, decreased balance, decreased endurance, decreased knowledge of condition, difficulty walking, decreased strength, increased edema, impaired perceived functional ability, and pain.    ACTIVITY LIMITATIONS: carrying, lifting, bending, sitting, standing, squatting, stairs, transfers, and locomotion level   PARTICIPATION LIMITATIONS: cleaning, driving, shopping, and community activity   PERSONAL FACTORS: CKD, arthritis, asthma, HTN, Lt THA are also affecting patient's functional outcome.    REHAB POTENTIAL: Good   CLINICAL DECISION MAKING: Stable/uncomplicated   EVALUATION COMPLEXITY: Low     GOALS: Goals reviewed with patient? Yes   SHORT TERM GOALS: Target date: 08/17/2022 Mahum will be independent with her day 1 home exercise program Baseline: Started 07/20/2022 Goal status: Met   2.  Grover will safely transition to a cane  without an increase in her right knee edema Baseline: Wheeled walker Goal status:  Met     LONG TERM GOALS: Target date: 10/12/2022   Scherry will report right knee pain consistently less than 3 out of 10 on the numeric pain rating scale Baseline: Can be as high as 6 out of 10 Goal status: On Going 08/03/2022   2.  Improve FOTO to 60 Baseline: 47, risk adjusted 45 Goal status: INITIAL   3.  Improve bilateral quadriceps and hip abductor strength as assessed by MMT Baseline: See above Goal status: INITIAL   4.  Medea will be able to safely transition off any assistive device without increased right knee edema Baseline: Wheeled walker Goal status: On Going 08/03/2022   5.  Lyric will be independent with her long-term home exercise program at discharge Baseline: Started 07/20/2022 Goal status: INITIAL     PLAN:   PT FREQUENCY:  2-3 times a week to start with transition to 1-2 times per week   PT DURATION: 12 weeks   PLANNED INTERVENTIONS: Therapeutic exercises, Therapeutic activity, Neuromuscular re-education, Balance training, Gait training, Patient/Family education, Self Care, Stair training, Electrical stimulation, Cryotherapy, Vasopneumatic device, and Manual therapy   PLAN FOR NEXT SESSION:  Slow progression in strength training watching symptoms/fatigue.  Balance improvements.    Chyrel Masson, PT, DPT, OCS, ATC 08/17/22  2:27 PM

## 2022-08-18 DIAGNOSIS — M329 Systemic lupus erythematosus, unspecified: Secondary | ICD-10-CM | POA: Diagnosis not present

## 2022-08-18 DIAGNOSIS — Z0100 Encounter for examination of eyes and vision without abnormal findings: Secondary | ICD-10-CM | POA: Diagnosis not present

## 2022-08-18 DIAGNOSIS — H40003 Preglaucoma, unspecified, bilateral: Secondary | ICD-10-CM | POA: Diagnosis not present

## 2022-08-18 DIAGNOSIS — Z79899 Other long term (current) drug therapy: Secondary | ICD-10-CM | POA: Diagnosis not present

## 2022-08-18 DIAGNOSIS — H2513 Age-related nuclear cataract, bilateral: Secondary | ICD-10-CM | POA: Diagnosis not present

## 2022-08-19 ENCOUNTER — Encounter: Payer: Self-pay | Admitting: Rehabilitative and Restorative Service Providers"

## 2022-08-19 ENCOUNTER — Ambulatory Visit (INDEPENDENT_AMBULATORY_CARE_PROVIDER_SITE_OTHER): Payer: Medicare PPO | Admitting: Rehabilitative and Restorative Service Providers"

## 2022-08-19 DIAGNOSIS — R600 Localized edema: Secondary | ICD-10-CM | POA: Diagnosis not present

## 2022-08-19 DIAGNOSIS — M6281 Muscle weakness (generalized): Secondary | ICD-10-CM | POA: Diagnosis not present

## 2022-08-19 DIAGNOSIS — R262 Difficulty in walking, not elsewhere classified: Secondary | ICD-10-CM

## 2022-08-19 DIAGNOSIS — M25561 Pain in right knee: Secondary | ICD-10-CM

## 2022-08-19 NOTE — Therapy (Signed)
OUTPATIENT PHYSICAL THERAPY TREATMENT     Patient Name: LILLAN MCCREADIE MRN: [id number] DOB:[birth year], 67 y.o., female Today's Date: 07/20/2022    PT End of Session - 08/19/22 0810     Visit Number 6    Number of Visits 18    Date for PT Re-Evaluation 10/12/22    Authorization Type [employer]    Authorization Time Period - 10/12/2022    Authorization - Visit Number 6    Authorization - Number of Visits 12    Progress Note Due on Visit 12    PT Start Time 0804    PT Stop Time 0848    PT Time Calculation (min) 44 min    Activity Tolerance Patient tolerated treatment well;No increased pain    Behavior During Therapy WFL for tasks assessed/performed                  Past Medical History:  Diagnosis Date   Anemia in chronic kidney disease (CKD)     Arthritis     Asthma     Blood dyscrasia      systemic lupus   Chronic kidney disease (CKD), active medical management without dialysis, stage 4 (severe) (HCC)     Dyspnea     GERD (gastroesophageal reflux disease)     Gout     Hypertension     Lupus (HCC)     Narcolepsy     Osteoporosis 05/05/2022   Pneumonia      x 1   Sleep apnea      tries to use CPAP nightly as of 04/29/22   Walker as Set designer dependent      when going outside home         Past Surgical History:  Procedure Laterality Date   CESAREAN SECTION        x 2   COLONOSCOPY       ganglion cyst removed Right      arm   TIBIA IM NAIL INSERTION Right 05/03/2022    Procedure: INTRAMEDULLARY (IM) NAIL TIBIAL;  Surgeon: Myrene Galas, MD;  Location: [facility] OR;  Service: Orthopedics;  Laterality: Right;   TOTAL HIP ARTHROPLASTY Left 08/26/2019    Procedure: LEFT TOTAL HIP ARTHROPLASTY ANTERIOR APPROACH;  Surgeon: Tarry Kos, MD;  Location: [facility] OR;  Service: Orthopedics;  Laterality: Left;   TUBAL LIGATION            Patient Active Problem List    Diagnosis Date Noted   Osteoporosis 05/05/2022   Closed fracture of shaft of right  tibia with nonunion 05/03/2022   Anemia of chronic renal failure 03/08/2021   Chronic renal disease, stage IV (HCC) 03/08/2021   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   AVN (avascular necrosis of bone) (HCC) 07/08/2019   Diverticulosis of colon without hemorrhage 07/08/2019   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  Surgery in 05/03/2022.     SUBJECTIVE:    SUBJECTIVE STATEMENT:  Carolle reports continued HEP compliance.  Her last imaging looked good and she doesn't go back to Dr. Carola Frost for 6 months.   PERTINENT HISTORY: CKD, arthritis, asthma, HTN, Lt THA  PAIN:  NPRS scale: 0-2/10 this week Pain location: Right inferior and medial knee Pain description: Burning, achy Aggravating factors: Walking, prolonged postures, some pain after prolonged weight-bearing, at night Relieving factors: Change of position, ice, rest   PRECAUTIONS: None   WEIGHT BEARING RESTRICTIONS: No   FALLS:  Has patient fallen in last 6 months? 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 No immediate postoperative complication.   PATIENT SURVEYS:  08/17/2022:  FOTO 57  07/20/2022 Eval FOTO 47 (risk-adjusted 45, Goal 60 in 15 visits)   COGNITION: 07/20/2022 Overall cognitive status: Within functional limits for tasks assessed                         SENSATION: 07/20/2022 Burning of inferior and medial knee, occasional shocking to lateral 2 toes from the medial ankle   EDEMA:  07/20/2022 Noted and not assssed secondary to starting 7 minutes late     LOWER EXTREMITY ROM:   Active ROM Left/Right 07/20/2022    Hip flexion      Hip extension      Hip abduction      Hip adduction      Hip internal rotation      Hip external rotation      Knee flexion 130/132    Knee extension 0/0    Ankle dorsiflexion      Ankle plantarflexion      Ankle inversion      Ankle eversion       (Blank rows = not tested)   LOWER EXTREMITY STRENGTH:   MMT Left/Right 07/20/2022  Right 08/17/2022 Left 08/17/2022  Hip flexion    5/5 5/5  Hip extension       Hip abduction 4-/4-     Hip adduction       Hip internal rotation       Hip external rotation       Knee flexion    5/5 5/5  Knee extension 4/4- 4/5 4+/5  Ankle dorsiflexion       Ankle plantarflexion       Ankle inversion       Ankle eversion        (Blank rows = not tested)     GAIT: 08/10/2022:   SPC use in clinic (in Lt hand) with variable step to and step through gait pattern.   07/20/2022 Distance walked: 100 feet Assistive device utilized: Environmental consultant - 2 wheeled Level of assistance: Modified independence Comments: Limp on the right side due to pain, quadriceps and hip abductors weakness                     TODAY'S TREATMENT:  DATE:  08/19/2022 NuStep Level 6 for 8 minutes legs only Heel raises with slow eccentrics 2 sets of 10 Upper heel cords stretch 2 x 20 seconds Lower  heel cords stretch 2 x 20 seconds Upper and lower heel cords box with slight toe in 60 seconds each Seated straight leg raises 3 sets of 5 for 3 seconds Heel raises with slow eccentrics 10 x 3 seconds  Functional Activities: Step up and over a 4 inch step with slow eccentrics and UE support 10 x    08/17/2022 TherEx Nustep lvl 6 8 mins UE/LE  Sit to stand to sit slowly lowering 19 inch table height x 15 for strength building Seated SLR c quad set 2 x 10 , performed bilaterally Leg press Double leg x 15 50 lbs, single leg 25 lbs x 15 bilaterally   Neuro Re-ed Tandem stance 1 min x 1 bilateral c occasional HHA required in // bars.  Alternating PF/DF in // bars with occasional HHA x 15 for ankle strategy improvements Feet together stance on foam eyes open 1 min with a few HHA to correct loss of balance - SBA Feet together stance on foam eyes open with head turns Lt and Rt x 10 - SBA    TODAY'S TREATMENT:                                                              DATE: 08/10/2022 TherEx Nustep lvl 6 8 mins UE/LE  Step up 4 inch with single hand on rail x 10 bilateral for strength building Sit to stand to sit slowly lowering 19 inch table height x 10 for strength building  Review of seated SLR per Pt reporting difficulty but not pain.  Seated Rt SLR c quad set 2 x 5   Neuro Re-ed Tandem stance 1 min x 1 bilateral c occasional HHA required in // bars.   TherActivity SPC use in clinic household distances between activity c supervision/SBA.  Performed ascending/descending ramp and 6 inch step training with cues for sequencing.  Step performed with Lt leg WB loading for ease.  Performed 30 ft, 60 ft, 30 ft, 60 ft     PATIENT EDUCATION:  07/20/2022 Education details: Reviewed exam findings and home exercise program Person educated: Patient Education method: Explanation, Demonstration, Tactile cues, Verbal cues, and Handouts Education comprehension: verbalized understanding, returned  demonstration, verbal cues required, tactile cues required, and needs further education   HOME EXERCISE PROGRAM: [id number]   ASSESSMENT:   CLINICAL IMPRESSION: Tamzin is getting stronger as compared to the last time I saw her ~ 2.5 weeks ago.  She did not have quadriceps lag with her straight leg raises and was able to control her descent with stairs with upper extremity support.  We also addressed some shin splint and digital nerve irritation symptoms with some calf and foo intrinsics strength work to complement her current quadriceps strength, balance and functional program.  Continue to address remaining impairments to meet long-term goals.    OBJECTIVE IMPAIRMENTS: Abnormal gait, decreased activity tolerance, decreased balance, decreased endurance, decreased knowledge of condition, difficulty walking, decreased strength, increased edema, impaired perceived functional ability, and pain.    ACTIVITY LIMITATIONS: carrying, lifting, bending, sitting, standing, squatting, stairs, transfers, and locomotion level   PARTICIPATION LIMITATIONS: cleaning, driving,  shopping, and community activity   PERSONAL FACTORS: CKD, arthritis, asthma, HTN, Lt THA are also affecting patient's functional outcome.    REHAB POTENTIAL: Good   CLINICAL DECISION MAKING: Stable/uncomplicated   EVALUATION COMPLEXITY: Low     GOALS: Goals reviewed with patient? Yes   SHORT TERM GOALS: Target date: 08/17/2022 Lisset will be independent with her day 1 home exercise program Baseline: Started 07/20/2022 Goal status: Met 08/19/2022   2.  Kresha will safely transition to a cane without an increase in her right knee edema Baseline: Wheeled walker Goal status:  Met 08/19/2022     LONG TERM GOALS: Target date: 10/12/2022   Loveta will report right knee pain consistently less than 3 out of 10 on the numeric pain rating scale Baseline: Can be as high as 6 out of 10 Goal status: On Going 08/19/2022   2.  Improve  FOTO to 60 Baseline: 47, risk adjusted 45 Goal status: On Going 08/19/2022   3.  Improve bilateral quadriceps and hip abductor strength as assessed by MMT Baseline: See above Goal status: On Going 08/19/2022   4.  Selen will be able to safely transition off any assistive device without increased right knee edema Baseline: Wheeled walker Goal status: On Going 08/19/2022   5.  Jasmain will be independent with her long-term home exercise program at discharge Baseline: Started 07/20/2022 Goal status: On Going 08/19/2022     PLAN:   PT FREQUENCY: 1-2 times per week   PT DURATION: 12 weeks total   PLANNED INTERVENTIONS: Therapeutic exercises, Therapeutic activity, Neuromuscular re-education, Balance training, Gait training, Patient/Family education, Self Care, Stair training, Electrical stimulation, Cryotherapy, Vasopneumatic device, and Manual therapy   PLAN FOR NEXT SESSION:  Appropriately progress quadriceps and lower extremity strength.  Watch for symptoms/fatigue.  Balance and functional progressions as appropriate.    Cherlyn Cushing PT, MPT 08/19/22  10:45 AM

## 2022-08-25 ENCOUNTER — Encounter: Payer: Medicare PPO | Admitting: Rehabilitative and Restorative Service Providers"

## 2022-08-25 ENCOUNTER — Telehealth: Payer: Self-pay | Admitting: Rehabilitative and Restorative Service Providers"

## 2022-08-25 NOTE — Telephone Encounter (Signed)
Colleen Obrien forgot her appointment.  Reminded her of her Tuesday July 30 11:45.  She says she will be there.

## 2022-08-26 DIAGNOSIS — Z124 Encounter for screening for malignant neoplasm of cervix: Secondary | ICD-10-CM | POA: Diagnosis not present

## 2022-08-26 DIAGNOSIS — Z01419 Encounter for gynecological examination (general) (routine) without abnormal findings: Secondary | ICD-10-CM | POA: Diagnosis not present

## 2022-08-30 ENCOUNTER — Ambulatory Visit (INDEPENDENT_AMBULATORY_CARE_PROVIDER_SITE_OTHER): Payer: Medicare PPO | Admitting: Rehabilitative and Restorative Service Providers"

## 2022-08-30 ENCOUNTER — Encounter: Payer: Self-pay | Admitting: Rehabilitative and Restorative Service Providers"

## 2022-08-30 DIAGNOSIS — M6281 Muscle weakness (generalized): Secondary | ICD-10-CM | POA: Diagnosis not present

## 2022-08-30 DIAGNOSIS — R262 Difficulty in walking, not elsewhere classified: Secondary | ICD-10-CM

## 2022-08-30 DIAGNOSIS — R600 Localized edema: Secondary | ICD-10-CM

## 2022-08-30 DIAGNOSIS — M25561 Pain in right knee: Secondary | ICD-10-CM | POA: Diagnosis not present

## 2022-08-30 NOTE — Therapy (Signed)
OUTPATIENT PHYSICAL THERAPY TREATMENT     Patient Name: Colleen Obrien MRN: [id number] DOB:[birth year], 67 y.o., female Today's Date: 07/20/2022    PT End of Session - 08/30/22 1142     Visit Number 7    Number of Visits 18    Date for PT Re-Evaluation 10/12/22    Authorization Type [employer]    Authorization Time Period - 10/12/2022    Authorization - Visit Number 7    Authorization - Number of Visits 12    Progress Note Due on Visit 12    PT Start Time 1143    PT Stop Time 1222    PT Time Calculation (min) 39 min    Activity Tolerance Patient tolerated treatment well    Behavior During Therapy WFL for tasks assessed/performed                   Past Medical History:  Diagnosis Date   Anemia in chronic kidney disease (CKD)     Arthritis     Asthma     Blood dyscrasia      systemic lupus   Chronic kidney disease (CKD), active medical management without dialysis, stage 4 (severe) (HCC)     Dyspnea     GERD (gastroesophageal reflux disease)     Gout     Hypertension     Lupus (HCC)     Narcolepsy     Osteoporosis 05/05/2022   Pneumonia      x 1   Sleep apnea      tries to use CPAP nightly as of 04/29/22   Walker as Set designer dependent      when going outside home         Past Surgical History:  Procedure Laterality Date   CESAREAN SECTION        x 2   COLONOSCOPY       ganglion cyst removed Right      arm   TIBIA IM NAIL INSERTION Right 05/03/2022    Procedure: INTRAMEDULLARY (IM) NAIL TIBIAL;  Surgeon: Myrene Galas, MD;  Location: [facility] OR;  Service: Orthopedics;  Laterality: Right;   TOTAL HIP ARTHROPLASTY Left 08/26/2019    Procedure: LEFT TOTAL HIP ARTHROPLASTY ANTERIOR APPROACH;  Surgeon: Tarry Kos, MD;  Location: [facility] OR;  Service: Orthopedics;  Laterality: Left;   TUBAL LIGATION            Patient Active Problem List    Diagnosis Date Noted   Osteoporosis 05/05/2022   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 05/03/2022   Anemia of chronic renal failure 03/08/2021   Chronic renal disease, stage IV (HCC) 03/08/2021   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   AVN (avascular necrosis of bone) (HCC) 07/08/2019   Diverticulosis of colon without hemorrhage 07/08/2019   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  Surgery in 05/03/2022.     SUBJECTIVE:    SUBJECTIVE STATEMENT: Colleen Obrien  indicated some pain in shin and calf area today.  Reported symptoms are constant.  Trying to do some walking without cane at home.    PERTINENT HISTORY: CKD, arthritis, asthma, HTN, Lt THA  PAIN:  NPRS scale: 1-2/10 Pain location: Rt shin/calf Pain description: pressure, burning  Aggravating factors: Walking, prolonged postures, some pain after prolonged weight-bearing, at night Relieving factors: Change of position, ice, rest   PRECAUTIONS: None   WEIGHT BEARING RESTRICTIONS: No   FALLS:  Has patient fallen in last 6 months? 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 No immediate postoperative complication.   PATIENT SURVEYS:  08/17/2022:  FOTO 57  07/20/2022 Eval FOTO 47 (risk-adjusted 45, Goal 60 in 15 visits)   COGNITION: 07/20/2022 Overall cognitive status: Within functional limits for tasks assessed                         SENSATION: 07/20/2022 Burning of inferior and medial knee, occasional shocking to lateral 2 toes from the medial ankle   EDEMA:  07/20/2022 Noted and not assssed secondary to starting 7 minutes late     LOWER EXTREMITY ROM:   Active ROM Left/Right 07/20/2022    Hip flexion      Hip extension      Hip abduction      Hip adduction      Hip internal rotation      Hip external rotation      Knee flexion 130/132    Knee extension 0/0    Ankle dorsiflexion      Ankle plantarflexion      Ankle inversion      Ankle eversion       (Blank rows = not tested)   LOWER EXTREMITY STRENGTH:   MMT Left/Right 07/20/2022  Right 08/17/2022 Left 08/17/2022 Right 08/30/2022  Hip flexion    5/5 5/5   Hip extension        Hip abduction 4-/4-      Hip adduction        Hip internal rotation        Hip external rotation        Knee flexion    5/5 5/5   Knee extension 4/4- 4/5 4+/5 4+/5  Ankle dorsiflexion        Ankle plantarflexion        Ankle inversion        Ankle eversion         (Blank rows = not tested)     GAIT: 08/10/2022:   SPC use in clinic (in Lt hand) with variable step to and step through gait pattern.   07/20/2022 Distance walked: 100 feet Assistive device utilized: Environmental consultant - 2 wheeled Level of assistance: Modified independence Comments: Limp on the right side due to pain, quadriceps and hip abductors weakness                    TODAY'S TREATMENT:  DATE: 08/30/2022 TherEx Recumbent bike seat 3 lvl 1 6 mins  Incline gastroc stretch 30 sec x 3 bilateral  Lateral step down WB on Rt leg x 15 4 inch step  Seated Rt  leg LAQ 3 lbs 2 x 15 c pause in end range Sit to stand to sit 19 inch x 10 c no UE assist and slow lowering   Neuro Re-ed Tandem stance 1 min x 1 bilateral c occasional HHA required in // bars on foam  Alternating PF/DF in // bars with occasional HHA x 15 for ankle strategy improvements Alternating toe tapping 4 inch step forward 2 x 10 bilateral with CGA    TODAY'S TREATMENT:                                                              DATE: 08/19/2022 NuStep Level 6 for 8 minutes legs only Heel raises with slow eccentrics 2 sets of 10 Upper heel cords stretch 2 x 20 seconds Lower heel cords stretch 2 x 20 seconds Upper and lower heel cords box with slight toe in 60 seconds each Seated straight leg raises 3 sets of 5 for 3 seconds Heel raises with slow eccentrics 10 x 3 seconds  Functional Activities: Step up and over a 4 inch step with slow eccentrics and UE support 10 x    TODAY'S TREATMENT:                                                              DATE: 08/17/2022 TherEx Nustep lvl 6 8 mins UE/LE  Sit to stand to sit slowly lowering 19 inch table height x 15 for strength building Seated SLR c quad set 2 x 10 , performed bilaterally Leg press Double leg x 15 50 lbs, single leg 25 lbs x 15 bilaterally   Neuro Re-ed Tandem stance 1 min x 1 bilateral c occasional HHA required in // bars.  Alternating PF/DF in // bars with occasional HHA x 15 for ankle strategy improvements Feet together stance on foam eyes open 1 min with a few HHA to correct loss of balance - SBA Feet together stance on foam eyes open with head turns Lt and Rt x 10 - SBA    TODAY'S TREATMENT:                                                              DATE: 08/10/2022 TherEx Nustep lvl 6 8 mins UE/LE  Step up 4 inch with single hand on rail x 10 bilateral for strength building Sit to stand to sit slowly lowering 19 inch table height x 10 for strength building  Review of seated SLR per Pt reporting  difficulty but not pain.  Seated Rt SLR c quad set 2 x 5   Neuro Re-ed Tandem stance 1 min x 1 bilateral c occasional HHA required in //  bars.   TherActivity SPC use in clinic household distances between activity c supervision/SBA.  Performed ascending/descending ramp and 6 inch step training with cues for sequencing.  Step performed with Lt leg WB loading for ease.  Performed 30 ft, 60 ft, 30 ft, 60 ft     PATIENT EDUCATION:  07/20/2022 Education details: Reviewed exam findings and home exercise program Person educated: Patient Education method: Explanation, Demonstration, Tactile cues, Verbal cues, and Handouts Education comprehension: verbalized understanding, returned demonstration, verbal cues required, tactile cues required, and needs further education   HOME EXERCISE PROGRAM: [id number]   ASSESSMENT:   CLINICAL IMPRESSION: Ambulation gait still impacted with deviations in stance/toe off progression on Rt leg compared to Lt , noted with and without cane. Rt quad strength continued to show steady improvement overall but continued to show need for continued strengthening.  Difficulty in SLS attempts. Continued skilled PT services indicated at this time.     OBJECTIVE IMPAIRMENTS: Abnormal gait, decreased activity tolerance, decreased balance, decreased endurance, decreased knowledge of condition, difficulty walking, decreased strength, increased edema, impaired perceived functional ability, and pain.    ACTIVITY LIMITATIONS: carrying, lifting, bending, sitting, standing, squatting, stairs, transfers, and locomotion level   PARTICIPATION LIMITATIONS: cleaning, driving, shopping, and community activity   PERSONAL FACTORS: CKD, arthritis, asthma, HTN, Lt THA are also affecting patient's functional outcome.    REHAB POTENTIAL: Good   CLINICAL DECISION MAKING: Stable/uncomplicated   EVALUATION COMPLEXITY: Low     GOALS: Goals reviewed with patient? Yes   SHORT TERM GOALS:  Target date: 08/17/2022 Colleen will be independent with her day 1 home exercise program Baseline: Started 07/20/2022 Goal status: Met 08/19/2022   2.  Colleen Obrien will safely transition to a cane without an increase in her right knee edema Baseline: Wheeled walker Goal status:  Met 08/19/2022     LONG TERM GOALS: Target date: 10/12/2022   Colleen Obrien will report right knee pain consistently less than 3 out of 10 on the numeric pain rating scale Baseline: Can be as high as 6 out of 10 Goal status: On Going 08/19/2022   2.  Improve FOTO to 60 Baseline: 47, risk adjusted 45 Goal status: On Going 08/19/2022   3.  Improve bilateral quadriceps and hip abductor strength as assessed by MMT Baseline: See above Goal status: On Going 08/19/2022   4.  Colleen Obrien will be able to safely transition off any assistive device without increased right knee edema Baseline: Wheeled walker Goal status: On Going 08/19/2022   5.  Colleen Obrien will be independent with her long-term home exercise program at discharge Baseline: Started 07/20/2022 Goal status: On Going 08/19/2022     PLAN:   PT FREQUENCY: 1-2 times per week   PT DURATION: 12 weeks total   PLANNED INTERVENTIONS: Therapeutic exercises, Therapeutic activity, Neuromuscular re-education, Balance training, Gait training, Patient/Family education, Self Care, Stair training, Electrical stimulation, Cryotherapy, Vasopneumatic device, and Manual therapy   PLAN FOR NEXT SESSION:  Progressive strengthening and balance improvements.    Chyrel Masson, PT, DPT, OCS, ATC 08/30/22  12:20 PM

## 2022-09-01 ENCOUNTER — Encounter: Payer: Self-pay | Admitting: Rehabilitative and Restorative Service Providers"

## 2022-09-01 ENCOUNTER — Ambulatory Visit (INDEPENDENT_AMBULATORY_CARE_PROVIDER_SITE_OTHER): Payer: Medicare PPO | Admitting: Rehabilitative and Restorative Service Providers"

## 2022-09-01 DIAGNOSIS — R600 Localized edema: Secondary | ICD-10-CM | POA: Diagnosis not present

## 2022-09-01 DIAGNOSIS — M25561 Pain in right knee: Secondary | ICD-10-CM | POA: Diagnosis not present

## 2022-09-01 DIAGNOSIS — M6281 Muscle weakness (generalized): Secondary | ICD-10-CM | POA: Diagnosis not present

## 2022-09-01 DIAGNOSIS — R262 Difficulty in walking, not elsewhere classified: Secondary | ICD-10-CM

## 2022-09-01 NOTE — Therapy (Signed)
OUTPATIENT PHYSICAL THERAPY TREATMENT     Patient Name: Colleen Obrien MRN: [id number] DOB:10-11-1955, 67 y.o., female Today's Date: 07/20/2022    PT End of Session - 09/01/22 1019     Visit Number 8    Number of Visits 18    Date for PT Re-Evaluation 10/12/22    Authorization Type [employer]    Authorization Time Period - 10/12/2022    Authorization - Visit Number 8    Authorization - Number of Visits 12    Progress Note Due on Visit 12    PT Start Time 1017    PT Stop Time 1100    PT Time Calculation (min) 43 min    Activity Tolerance Patient tolerated treatment well;No increased pain    Behavior During Therapy WFL for tasks assessed/performed                  Past Medical History:  Diagnosis Date   Anemia in chronic kidney disease (CKD)     Arthritis     Asthma     Blood dyscrasia      systemic lupus   Chronic kidney disease (CKD), active medical management without dialysis, stage 4 (severe) (HCC)     Dyspnea     GERD (gastroesophageal reflux disease)     Gout     Hypertension     Lupus (HCC)     Narcolepsy     Osteoporosis 05/05/2022   Pneumonia      x 1   Sleep apnea      tries to use CPAP nightly as of 04/29/22   Walker as Set designer dependent      when going outside home         Past Surgical History:  Procedure Laterality Date   CESAREAN SECTION        x 2   COLONOSCOPY       ganglion cyst removed Right      arm   TIBIA IM NAIL INSERTION Right 05/03/2022    Procedure: INTRAMEDULLARY (IM) NAIL TIBIAL;  Surgeon: Myrene Galas, MD;  Location: [facility] OR;  Service: Orthopedics;  Laterality: Right;   TOTAL HIP ARTHROPLASTY Left 08/26/2019    Procedure: LEFT TOTAL HIP ARTHROPLASTY ANTERIOR APPROACH;  Surgeon: Tarry Kos, MD;  Location: [facility] OR;  Service: Orthopedics;  Laterality: Left;   TUBAL LIGATION            Patient Active Problem List    Diagnosis Date Noted   Osteoporosis 05/05/2022   Closed fracture of shaft of right  tibia with nonunion 05/03/2022   Anemia of chronic renal failure 03/08/2021   Chronic renal disease, stage IV (HCC) 03/08/2021   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   AVN (avascular necrosis of bone) (HCC) 07/08/2019   Diverticulosis of colon without hemorrhage 07/08/2019   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  Surgery in 05/03/2022.     SUBJECTIVE:    SUBJECTIVE STATEMENT:  Colleen Obrien reports she has been focusing on sit to stand and heel raises at home.  Increased seated SLR reps encouraged.   PERTINENT HISTORY: CKD, arthritis, asthma, HTN, Lt THA  PAIN:  NPRS scale: 1-2/10 this week Pain location: Rt shin/calf and knee joint Pain description: pressure, burning  Aggravating factors: Walking, prolonged postures, some pain after prolonged weight-bearing, at night Relieving factors: Change of position, ice, rest   PRECAUTIONS: None   WEIGHT BEARING RESTRICTIONS: No   FALLS:  Has patient fallen in last 6 months? 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 No immediate postoperative complication.   PATIENT SURVEYS:  08/17/2022:  FOTO 57  07/20/2022 Eval FOTO 47 (risk-adjusted 45, Goal 60 in 15 visits)   COGNITION: 07/20/2022 Overall cognitive status: Within functional limits for tasks assessed                         SENSATION: 07/20/2022 Burning of inferior and medial knee, occasional shocking to lateral 2 toes from the medial ankle   EDEMA:  07/20/2022 Noted and not assssed secondary to starting 7 minutes late     LOWER EXTREMITY ROM:   Active ROM Left/Right 07/20/2022    Hip flexion      Hip extension      Hip abduction      Hip adduction      Hip internal rotation      Hip external rotation      Knee flexion 130/132    Knee extension 0/0    Ankle dorsiflexion      Ankle plantarflexion      Ankle inversion      Ankle eversion       (Blank rows = not tested)   LOWER EXTREMITY STRENGTH:   MMT Left/Right 07/20/2022  Right 08/17/2022 Left 08/17/2022 Right 08/30/2022 Left/Right in pounds 09/01/2022  Hip flexion    5/5 5/5    Hip extension         Hip abduction 4-/4-       Hip adduction         Hip internal rotation         Hip external rotation         Knee flexion    5/5 5/5    Knee extension 4/4- 4/5 4+/5 4+/5 40.5/24.8  Ankle dorsiflexion         Ankle plantarflexion         Ankle inversion         Ankle eversion          (Blank rows = not tested)     GAIT: 08/10/2022:   SPC use in clinic (in Lt hand) with variable step to and step through gait pattern.   07/20/2022 Distance walked: 100 feet Assistive device utilized: Environmental consultant - 2 wheeled Level of assistance: Modified independence Comments: Limp on the right side due to pain, quadriceps and hip abductors weakness                    TODAY'S TREATMENT:  DATE:  09/01/2022 Recumbent bike Seat 3 for 5 minutes, Level 3 Seated straight leg raises 3 sets  of 5 with 1# slow eccentrics  Upper heel cords stretch 4 x 20 seconds Lower heel cords stretch 4 x 20 seconds Standing heel raises 10 x 3 seconds  Functional Activities: Double Leg Press 100# 2 sets of 10 slow eccentrics Single Leg Press 43# 10 x slow eccentrics   08/30/2022 TherEx Recumbent bike seat 3 lvl 1 6 mins  Incline gastroc stretch 30 sec x 3 bilateral  Lateral step down WB on Rt leg x 15 4 inch step  Seated Rt leg LAQ 3 lbs 2 x 15 c pause in end range Sit to stand to sit 19 inch x 10 c no UE assist and slow lowering   Neuro Re-ed Tandem stance 1 min x 1 bilateral c occasional HHA required in // bars on foam  Alternating PF/DF in // bars with occasional HHA x 15 for ankle strategy improvements Alternating toe tapping 4 inch step forward 2 x 10 bilateral with CGA    TODAY'S TREATMENT:                                                              DATE: 08/19/2022 NuStep Level 6 for 8 minutes legs only Heel raises with slow eccentrics 2 sets of 10 Upper heel cords stretch 2 x 20 seconds Lower heel cords stretch 2 x 20 seconds Upper and lower heel cords box with slight toe in 60 seconds each Seated straight leg raises 3 sets of 5 for 3 seconds Heel raises with slow eccentrics 10 x 3 seconds  Functional Activities: Step up and over a 4 inch step with slow eccentrics and UE support 10 x      PATIENT EDUCATION:  07/20/2022 Education details: Reviewed exam findings and home exercise program Person educated: Patient Education method: Explanation, Demonstration, Tactile cues, Verbal cues, and Handouts Education comprehension: verbalized understanding, returned demonstration, verbal cues required, tactile cues required, and needs further education   HOME EXERCISE PROGRAM: [id number]   ASSESSMENT:   CLINICAL IMPRESSION: Felicidad notes using her cane less often in the house.  She has been very good with her weight-bearing strengthening.  I encouraged her to focus on the  non-weight bearing seated straight leg raises with her HEP to improve quadriceps strength and prepare for more weight-bearing and functional activities.  Both quadriceps strength measurements were low (75 pounds Goal with 40 pounds left and 25 pounds right quadriceps assessed today) and this is consistent with right knee pain with prolonged weight-bearing.  Saya will continue to benefit from appropriate quadriceps strength interventions to improve weight-bearing endurance and function.   OBJECTIVE IMPAIRMENTS: Abnormal gait, decreased activity tolerance, decreased balance, decreased endurance, decreased knowledge of condition, difficulty walking, decreased strength, increased edema, impaired perceived functional ability, and pain.    ACTIVITY LIMITATIONS: carrying, lifting, bending, sitting, standing, squatting, stairs, transfers, and locomotion level   PARTICIPATION LIMITATIONS: cleaning, driving, shopping, and community activity   PERSONAL FACTORS: CKD, arthritis, asthma, HTN, Lt THA are also affecting patient's functional outcome.    REHAB POTENTIAL: Good   CLINICAL DECISION MAKING: Stable/uncomplicated   EVALUATION COMPLEXITY: Low     GOALS: Goals reviewed with patient? Yes   SHORT TERM GOALS: Target date:  08/17/2022 Chrystelle will be independent with her day 1 home exercise program Baseline: Started 07/20/2022 Goal status: Met 08/19/2022   2.  Gaynel will safely transition to a cane without an increase in her right knee edema Baseline: Wheeled walker Goal status:  Met 08/19/2022     LONG TERM GOALS: Target date: 10/12/2022   Anjolina will report right knee pain consistently less than 3 out of 10 on the numeric pain rating scale Baseline: Can be as high as 6 out of 10 Goal status: On Going 09/01/2022   2.  Improve FOTO to 60 Baseline: 47, risk adjusted 45 Goal status: On Going 08/19/2022   3.  Improve bilateral quadriceps and hip abductor strength as assessed by MMT Baseline:  See above Goal status: On Going 09/01/2022   4.  Mckenley will be able to safely transition off any assistive device without increased right knee edema Baseline: Wheeled walker Goal status: On Going 09/01/2022   5.  Tranesha will be independent with her long-term home exercise program at discharge Baseline: Started 07/20/2022 Goal status: On Going 09/01/2022     PLAN:   PT FREQUENCY: 1-2 times per week   PT DURATION: 12 weeks total   PLANNED INTERVENTIONS: Therapeutic exercises, Therapeutic activity, Neuromuscular re-education, Balance training, Gait training, Patient/Family education, Self Care, Stair training, Electrical stimulation, Cryotherapy, Vasopneumatic device, and Manual therapy   PLAN FOR NEXT SESSION:  Quadriceps strengthening to improve endurance with weight-bearing function.  Interventions for shin splints as needed.   Cherlyn Cushing PT, MPT 09/01/22  11:14 AM

## 2022-09-05 ENCOUNTER — Encounter: Payer: Self-pay | Admitting: Rehabilitative and Restorative Service Providers"

## 2022-09-05 ENCOUNTER — Ambulatory Visit (INDEPENDENT_AMBULATORY_CARE_PROVIDER_SITE_OTHER): Payer: Medicare PPO | Admitting: Rehabilitative and Restorative Service Providers"

## 2022-09-05 DIAGNOSIS — M25561 Pain in right knee: Secondary | ICD-10-CM

## 2022-09-05 DIAGNOSIS — R262 Difficulty in walking, not elsewhere classified: Secondary | ICD-10-CM

## 2022-09-05 DIAGNOSIS — R600 Localized edema: Secondary | ICD-10-CM | POA: Diagnosis not present

## 2022-09-05 DIAGNOSIS — M6281 Muscle weakness (generalized): Secondary | ICD-10-CM | POA: Diagnosis not present

## 2022-09-05 DIAGNOSIS — Z01818 Encounter for other preprocedural examination: Secondary | ICD-10-CM | POA: Diagnosis not present

## 2022-09-05 DIAGNOSIS — Z7682 Awaiting organ transplant status: Secondary | ICD-10-CM | POA: Diagnosis not present

## 2022-09-05 NOTE — Therapy (Signed)
OUTPATIENT PHYSICAL THERAPY TREATMENT     Patient Name: Colleen Obrien MRN: [id number] DOB:October 25, 1955, 67 y.o., female Today's Date: 07/20/2022    PT End of Session - 09/05/22 1153     Visit Number 9    Number of Visits 18    Date for PT Re-Evaluation 10/12/22    Authorization Type [employer]    Authorization Time Period - 10/12/2022    Authorization - Number of Visits 12    Progress Note Due on Visit 12    PT Start Time 1141    PT Stop Time 1220    PT Time Calculation (min) 39 min    Activity Tolerance Patient tolerated treatment well    Behavior During Therapy WFL for tasks assessed/performed                   Past Medical History:  Diagnosis Date   Anemia in chronic kidney disease (CKD)     Arthritis     Asthma     Blood dyscrasia      systemic lupus   Chronic kidney disease (CKD), active medical management without dialysis, stage 4 (severe) (HCC)     Dyspnea     GERD (gastroesophageal reflux disease)     Gout     Hypertension     Lupus (HCC)     Narcolepsy     Osteoporosis 05/05/2022   Pneumonia      x 1   Sleep apnea      tries to use CPAP nightly as of 04/29/22   Walker as Set designer dependent      when going outside home         Past Surgical History:  Procedure Laterality Date   CESAREAN SECTION        x 2   COLONOSCOPY       ganglion cyst removed Right      arm   TIBIA IM NAIL INSERTION Right 05/03/2022    Procedure: INTRAMEDULLARY (IM) NAIL TIBIAL;  Surgeon: Myrene Galas, MD;  Location: [facility] OR;  Service: Orthopedics;  Laterality: Right;   TOTAL HIP ARTHROPLASTY Left 08/26/2019    Procedure: LEFT TOTAL HIP ARTHROPLASTY ANTERIOR APPROACH;  Surgeon: Tarry Kos, MD;  Location: [facility] OR;  Service: Orthopedics;  Laterality: Left;   TUBAL LIGATION            Patient Active Problem List    Diagnosis Date Noted   Osteoporosis 05/05/2022   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 05/03/2022   Anemia of chronic  renal failure 03/08/2021   Chronic renal disease, stage IV (HCC) 03/08/2021   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   AVN (avascular necrosis of bone) (HCC) 07/08/2019   Diverticulosis of colon without hemorrhage 07/08/2019   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  Surgery in 05/03/2022.     SUBJECTIVE:    SUBJECTIVE STATEMENT: Pt reported overall about the same symptoms.  Sometimes  better sometimes the same.     PERTINENT HISTORY: CKD, arthritis, asthma, HTN, Lt THA  PAIN:  NPRS scale: 1-2/10 this week Pain location: Rt shin/calf and knee joint Pain description: pressure, burning  Aggravating factors: Walking, prolonged postures, some pain after prolonged weight-bearing, at night Relieving factors: Change of position, ice, rest   PRECAUTIONS: None   WEIGHT BEARING RESTRICTIONS: No   FALLS:  Has patient fallen in last 6 months? 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 No immediate postoperative  complication.   PATIENT SURVEYS:  08/17/2022:  FOTO 57  07/20/2022 Eval FOTO 47 (risk-adjusted 45, Goal 60 in 15 visits)   COGNITION: 07/20/2022 Overall cognitive status: Within functional limits for tasks assessed                         SENSATION: 07/20/2022 Burning of inferior and medial knee, occasional shocking to lateral 2 toes from the medial ankle   EDEMA:  07/20/2022 Noted and not assssed secondary to starting 7 minutes late     LOWER EXTREMITY ROM:   Active ROM Left/Right 07/20/2022    Hip flexion      Hip extension      Hip abduction      Hip adduction      Hip internal rotation      Hip external rotation      Knee flexion 130/132    Knee extension 0/0    Ankle dorsiflexion      Ankle plantarflexion      Ankle inversion      Ankle eversion       (Blank rows = not tested)   LOWER EXTREMITY STRENGTH:   MMT Left/Right 07/20/2022  Right 08/17/2022 Left 08/17/2022 Right 08/30/2022 Left/Right in pounds 09/01/2022  Hip flexion    5/5 5/5    Hip extension         Hip abduction 4-/4-       Hip adduction         Hip internal rotation         Hip external rotation         Knee flexion    5/5 5/5    Knee extension 4/4- 4/5 4+/5 4+/5 40.5/24.8  Ankle dorsiflexion         Ankle plantarflexion         Ankle inversion         Ankle eversion          (Blank rows = not tested)     GAIT: 09/05/2022:  Ambulation into and in clinic independently.   08/10/2022:   SPC use in clinic (in Lt hand) with variable step to and step through gait pattern.   07/20/2022 Distance walked: 100 feet Assistive device utilized: Environmental consultant - 2 wheeled Level of assistance: Modified independence Comments: Limp on the right side due to pain, quadriceps and hip abductors weakness                    TODAY'S TREATMENT:  DATE 09/05/2022 TherEx Recumbent bike seat 3 lvl 1-2 8 mins Leg press double leg 87 lbs x 15, single leg 2 x 10 bilateral  43 lbs  Incline gastroc stretch 30 sec x 3 bilateral  Stair navigation 4 step up/down.  Due to Lt knee complaints, Rt leg showed as more comfortable to WB.    Neuro Re-ed Tandem stance 1 min x 2 bilateral on foam c occasional HHA required on bar  Alternating PF/DF on foam with occasional HHA x 20 for ankle strategy improvements Alternating toe tapping 4 inch step forward 2 x 10 bilateral with CGA  TODAY'S TREATMENT:                                                              DATE: 09/01/2022 Recumbent bike Seat 3 for 5 minutes, Level 3 Seated straight leg raises 3 sets of 5 with 1# slow eccentrics  Upper heel cords stretch 4 x 20 seconds Lower heel cords stretch 4 x 20 seconds Standing heel raises 10 x 3 seconds  Functional Activities: Double Leg Press 100# 2 sets of 10 slow eccentrics Single Leg Press 43# 10 x slow eccentrics   TODAY'S TREATMENT:                                                              DATE 08/30/2022 TherEx Recumbent bike seat 3 lvl 1 6 mins  Incline gastroc stretch 30 sec x 3 bilateral  Lateral step down WB on Rt leg x 15 4 inch step  Seated Rt leg LAQ 3 lbs 2 x 15 c pause in end range Sit to stand to sit 19 inch x 10 c no UE assist and slow lowering   Neuro Re-ed Tandem stance 1 min x 1 bilateral c occasional HHA required in // bars on foam  Alternating PF/DF in // bars with occasional HHA x 15 for ankle strategy improvements Alternating toe tapping 4 inch step forward 2 x 10 bilateral with CGA      PATIENT EDUCATION:  07/20/2022 Education details: Reviewed exam findings and home exercise program Person educated: Patient Education method: Explanation, Demonstration, Tactile cues, Verbal cues, and Handouts Education comprehension: verbalized understanding, returned demonstration, verbal cues required, tactile cues required, and needs further education   HOME EXERCISE PROGRAM: [id number]   ASSESSMENT:   CLINICAL IMPRESSION: Independent ambulation  improving with mild deviation still noted in stance, toe off progression.  Pt to benefit from continued compliant surface and dynamic balance improvements.    OBJECTIVE IMPAIRMENTS: Abnormal gait, decreased activity tolerance, decreased balance, decreased endurance, decreased knowledge of condition, difficulty walking, decreased strength, increased edema, impaired perceived functional ability, and pain.    ACTIVITY LIMITATIONS: carrying, lifting, bending, sitting, standing, squatting, stairs, transfers, and locomotion level   PARTICIPATION LIMITATIONS: cleaning, driving, shopping, and community activity   PERSONAL FACTORS: CKD, arthritis, asthma, HTN, Lt THA are also affecting patient's functional outcome.    REHAB POTENTIAL: Good   CLINICAL DECISION MAKING: Stable/uncomplicated   EVALUATION COMPLEXITY: Low     GOALS: Goals reviewed with patient? Yes  SHORT TERM GOALS: Target date: 08/17/2022 Tabby will be independent with her day 1 home exercise program Baseline: Started 07/20/2022 Goal status: Met 08/19/2022   2.  Lacandice will safely transition to a cane without an increase in her right knee edema Baseline: Wheeled walker Goal status:  Met 08/19/2022     LONG TERM GOALS: Target date: 10/12/2022   Sharonne will report right knee pain consistently less than 3 out of 10 on the numeric pain rating scale Baseline: Can be as high as 6 out of 10 Goal status: On Going 09/05/2022   2.  Improve FOTO to 60 Baseline: 47, risk adjusted 45 Goal status: On Going 09/05/2022   3.  Improve bilateral quadriceps and hip abductor strength as assessed by MMT Baseline: See above Goal status: On Going 09/05/2022   4.  Madisan will be able to safely transition off any assistive device without increased right knee edema Baseline: Wheeled walker Goal status: On Going 09/05/2022   5.  Cythnia will be independent with her long-term home exercise program at discharge Baseline: Started 07/20/2022 Goal  status: On Going 09/05/2022     PLAN:   PT FREQUENCY: 1-2 times per week   PT DURATION: 12 weeks total   PLANNED INTERVENTIONS: Therapeutic exercises, Therapeutic activity, Neuromuscular re-education, Balance training, Gait training, Patient/Family education, Self Care, Stair training, Electrical stimulation, Cryotherapy, Vasopneumatic device, and Manual therapy   PLAN FOR NEXT SESSION:  Continue progressive strengthening/balance with inclusion of progression of cardiovascular endurance improvements.      Chyrel Masson, PT, DPT, OCS, ATC 09/05/22  12:22 PM

## 2022-09-06 ENCOUNTER — Ambulatory Visit (HOSPITAL_COMMUNITY)
Admission: RE | Admit: 2022-09-06 | Discharge: 2022-09-06 | Disposition: A | Discharge status: Home / Self Care | Payer: Medicare PPO | Source: Ambulatory Visit | Attending: Nephrology | Admitting: Nephrology

## 2022-09-06 VITALS — BP 139/91 | HR 63 | Temp 97.5°F | Resp 17

## 2022-09-06 DIAGNOSIS — D631 Anemia in chronic kidney disease: Secondary | ICD-10-CM | POA: Insufficient documentation

## 2022-09-06 DIAGNOSIS — N189 Chronic kidney disease, unspecified: Secondary | ICD-10-CM | POA: Diagnosis not present

## 2022-09-06 LAB — RENAL FUNCTION PANEL
Albumin: 3.5 g/dL (ref 3.5–5.0)
Anion gap: 9 (ref 5–15)
BUN: 60 mg/dL — ABNORMAL HIGH (ref 8–23)
CO2: 17 mmol/L — ABNORMAL LOW (ref 22–32)
Calcium: 11.6 mg/dL — ABNORMAL HIGH (ref 8.9–10.3)
Chloride: 108 mmol/L (ref 98–111)
Creatinine, Ser: 5.61 mg/dL — ABNORMAL HIGH (ref 0.44–1.00)
GFR, Estimated: 8 mL/min — ABNORMAL LOW (ref >60)
Glucose, Bld: 95 mg/dL (ref 70–99)
Phosphorus: 5.5 mg/dL — ABNORMAL HIGH (ref 2.5–4.6)
Potassium: 4.4 mmol/L (ref 3.5–5.1)
Sodium: 134 mmol/L — ABNORMAL LOW (ref 135–145)

## 2022-09-06 LAB — IRON AND TIBC
Iron: 34 ug/dL (ref 28–170)
Saturation Ratios: 14 % (ref 10.4–31.8)
TIBC: 244 ug/dL — ABNORMAL LOW (ref 250–450)
UIBC: 210 ug/dL

## 2022-09-06 LAB — URIC ACID: Uric Acid, Serum: 5.8 mg/dL (ref 2.5–7.1)

## 2022-09-06 LAB — POCT HEMOGLOBIN-HEMACUE: Hemoglobin: 10.5 g/dL — ABNORMAL LOW (ref 12.0–15.0)

## 2022-09-06 MED ORDER — EPOETIN ALFA-EPBX 10000 UNIT/ML IJ SOLN
20000.0000 [IU] | INTRAMUSCULAR | Status: DC
  Administered 2022-09-06: 20000 [IU] via SUBCUTANEOUS

## 2022-09-06 MED ORDER — EPOETIN ALFA-EPBX 10000 UNIT/ML IJ SOLN
INTRAMUSCULAR | Status: AC
  Filled 2022-09-06: qty 2

## 2022-09-07 DIAGNOSIS — S42291D Other displaced fracture of upper end of right humerus, subsequent encounter for fracture with routine healing: Secondary | ICD-10-CM | POA: Diagnosis not present

## 2022-09-07 DIAGNOSIS — M25511 Pain in right shoulder: Secondary | ICD-10-CM | POA: Diagnosis not present

## 2022-09-07 DIAGNOSIS — G8929 Other chronic pain: Secondary | ICD-10-CM | POA: Diagnosis not present

## 2022-09-12 NOTE — Therapy (Signed)
OUTPATIENT PHYSICAL THERAPY TREATMENT     Patient Name: Colleen Obrien MRN: [id number] DOB:01-16-1956, 67 y.o., female Today's Date: 07/20/2022            Past Medical History:  Diagnosis Date   Anemia in chronic kidney disease (CKD)     Arthritis     Asthma     Blood dyscrasia      systemic lupus   Chronic kidney disease (CKD), active medical management without dialysis, stage 4 (severe) (HCC)     Dyspnea     GERD (gastroesophageal reflux disease)     Gout     Hypertension     Lupus (HCC)     Narcolepsy     Osteoporosis 05/05/2022   Pneumonia      x 1   Sleep apnea      tries to use CPAP nightly as of 04/29/22   Walker as Set designer dependent      when going outside home         Past Surgical History:  Procedure Laterality Date   CESAREAN SECTION        x 2   COLONOSCOPY       ganglion cyst removed Right      arm   TIBIA IM NAIL INSERTION Right 05/03/2022    Procedure: INTRAMEDULLARY (IM) NAIL TIBIAL;  Surgeon: Myrene Galas, MD;  Location: [facility];  Service: Orthopedics;  Laterality: Right;   TOTAL HIP ARTHROPLASTY Left 08/26/2019    Procedure: LEFT TOTAL HIP ARTHROPLASTY ANTERIOR APPROACH;  Surgeon: Tarry Kos, MD;  Location: [facility];  Service: Orthopedics;  Laterality: Left;   TUBAL LIGATION            Patient Active Problem List    Diagnosis Date Noted   Osteoporosis 05/05/2022   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 05/03/2022   Anemia of chronic renal failure 03/08/2021   Chronic renal disease, stage IV (HCC) 03/08/2021   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   AVN (avascular necrosis of bone) (HCC) 07/08/2019   Diverticulosis of colon without hemorrhage 07/08/2019   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  Surgery in 05/03/2022.     SUBJECTIVE:    SUBJECTIVE STATEMENT: Pt reported overall about the same symptoms.  Sometimes better sometimes the same.     PERTINENT HISTORY: CKD, arthritis, asthma, HTN, Lt THA  PAIN:  NPRS scale: 1-2/10 this week Pain location: Rt shin/calf and knee joint Pain description: pressure, burning  Aggravating factors: Walking, prolonged postures, some pain after prolonged weight-bearing, at night Relieving factors: Change of position, ice, rest   PRECAUTIONS: None   WEIGHT BEARING RESTRICTIONS: No   FALLS:  Has patient fallen in last 6 months? 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 No immediate postoperative complication.   PATIENT SURVEYS:  08/17/2022:  FOTO 57  07/20/2022 Eval FOTO 47 (risk-adjusted 45, Goal 60 in 15 visits)   COGNITION: 07/20/2022 Overall cognitive status: Within functional limits for tasks assessed                         SENSATION: 07/20/2022 Burning of inferior and medial knee, occasional shocking to lateral 2 toes from the medial ankle   EDEMA:  07/20/2022 Noted and not assssed secondary to starting 7 minutes late     LOWER EXTREMITY ROM:   Active  ROM Left/Right 07/20/2022    Hip flexion      Hip extension      Hip abduction      Hip adduction      Hip internal rotation      Hip external rotation      Knee flexion 130/132    Knee extension 0/0    Ankle dorsiflexion      Ankle plantarflexion      Ankle inversion      Ankle eversion       (Blank rows = not tested)   LOWER EXTREMITY STRENGTH:   MMT Left/Right 07/20/2022  Right 08/17/2022 Left 08/17/2022 Right 08/30/2022 Left/Right in pounds 09/01/2022  Hip flexion    5/5 5/5    Hip extension         Hip abduction 4-/4-       Hip adduction         Hip internal rotation         Hip external rotation         Knee flexion    5/5 5/5    Knee extension 4/4- 4/5 4+/5 4+/5 40.5/24.8  Ankle dorsiflexion         Ankle plantarflexion         Ankle inversion         Ankle eversion          (Blank rows = not tested)     GAIT: 09/05/2022:  Ambulation into and in clinic independently.   08/10/2022:   SPC use in clinic (in Lt hand) with variable step to and step through gait pattern.   07/20/2022 Distance walked: 100 feet Assistive device utilized: Environmental consultant - 2 wheeled Level of assistance: Modified independence Comments: Limp on the right side due to pain, quadriceps and hip abductors weakness                    TODAY'S TREATMENT:                                                              DATE 09/13/2022 TherEx Recumbent bike seat 3 lvl 1-2 8 mins Leg press double leg 87 lbs x 15, single leg 2 x 10 bilateral 43 lbs  Incline gastroc stretch 30 sec x 3 bilateral  Stair navigation 4 step up/down.  Due to Lt knee complaints, Rt leg showed as more comfortable to WB.  Neuro Re-ed Tandem stance 1 min x 2 bilateral on foam c occasional HHA required on bar  Alternating PF/DF on foam with occasional HHA x 20 for ankle strategy improvements Alternating toe tapping 4 inch step forward 2 x 10 bilateral with CGA  TODAY'S TREATMENT:                                                               DATE 09/05/2022 TherEx Recumbent bike seat 3 lvl 1-2 8 mins Leg press double leg 87 lbs x 15, single leg 2 x 10 bilateral 43 lbs  Incline gastroc stretch 30 sec x 3 bilateral  Stair navigation 4 step up/down.  Due to Lt knee complaints, Rt leg showed as more comfortable to WB.    Neuro Re-ed Tandem stance 1 min x 2 bilateral on foam c occasional HHA required on bar  Alternating PF/DF on foam with occasional HHA x 20 for ankle strategy improvements Alternating toe tapping 4 inch step forward 2 x 10 bilateral with CGA  TODAY'S TREATMENT:                                                              DATE: 09/01/2022 Recumbent bike Seat 3 for 5 minutes, Level 3 Seated straight leg raises 3 sets of 5 with 1# slow eccentrics  Upper heel cords stretch 4 x 20 seconds Lower heel cords stretch 4 x 20 seconds Standing heel raises 10 x 3 seconds  Functional Activities: Double Leg Press 100# 2 sets of 10 slow eccentrics Single Leg Press 43# 10 x slow eccentrics   TODAY'S TREATMENT:                                                              DATE 08/30/2022 TherEx Recumbent bike seat 3 lvl 1 6 mins  Incline gastroc stretch 30 sec x 3 bilateral  Lateral step down WB on Rt leg x 15 4 inch step  Seated Rt leg LAQ 3 lbs 2 x 15 c pause in end range Sit to stand to sit 19 inch x 10 c no UE assist and slow lowering   Neuro Re-ed Tandem stance 1 min x 1 bilateral c occasional HHA required in // bars on foam  Alternating PF/DF in // bars with occasional HHA x 15 for ankle strategy improvements Alternating toe tapping 4 inch step forward 2 x 10 bilateral with CGA      PATIENT EDUCATION:  07/20/2022 Education details: Reviewed exam findings and home exercise program Person educated: Patient Education method: Explanation, Demonstration, Tactile cues, Verbal cues, and Handouts Education comprehension: verbalized understanding, returned demonstration, verbal cues required, tactile cues required, and  needs further education   HOME EXERCISE PROGRAM: [id number]   ASSESSMENT:   CLINICAL IMPRESSION: Independent ambulation improving with mild deviation still noted in stance, toe off progression.  Pt to benefit from  continued compliant surface and dynamic balance improvements.    OBJECTIVE IMPAIRMENTS: Abnormal gait, decreased activity tolerance, decreased balance, decreased endurance, decreased knowledge of condition, difficulty walking, decreased strength, increased edema, impaired perceived functional ability, and pain.    ACTIVITY LIMITATIONS: carrying, lifting, bending, sitting, standing, squatting, stairs, transfers, and locomotion level   PARTICIPATION LIMITATIONS: cleaning, driving, shopping, and community activity   PERSONAL FACTORS: CKD, arthritis, asthma, HTN, Lt THA are also affecting patient's functional outcome.    REHAB POTENTIAL: Good   CLINICAL DECISION MAKING: Stable/uncomplicated   EVALUATION COMPLEXITY: Low     GOALS: Goals reviewed with patient? Yes   SHORT TERM GOALS: Target date: 08/17/2022 Eloise will be independent with her day 1 home exercise program Baseline: Started 07/20/2022 Goal status: Met 08/19/2022   2.  Audriella will safely transition to a cane without an increase in her right knee edema Baseline: Wheeled walker Goal status:  Met 08/19/2022     LONG TERM GOALS: Target date: 10/12/2022   Loys will report right knee pain consistently less than 3 out of 10 on the numeric pain rating scale Baseline: Can be as high as 6 out of 10 Goal status: On Going 09/05/2022   2.  Improve FOTO to 60 Baseline: 47, risk adjusted 45 Goal status: On Going 09/05/2022   3.  Improve bilateral quadriceps and hip abductor strength as assessed by MMT Baseline: See above Goal status: On Going 09/05/2022   4.  Geneiveve will be able to safely transition off any assistive device without increased right knee edema Baseline: Wheeled walker Goal status: On Going 09/05/2022    5.  Cerena will be independent with her long-term home exercise program at discharge Baseline: Started 07/20/2022 Goal status: On Going 09/05/2022     PLAN:   PT FREQUENCY: 1-2 times per week   PT DURATION: 12 weeks total   PLANNED INTERVENTIONS: Therapeutic exercises, Therapeutic activity, Neuromuscular re-education, Balance training, Gait training, Patient/Family education, Self Care, Stair training, Electrical stimulation, Cryotherapy, Vasopneumatic device, and Manual therapy   PLAN FOR NEXT SESSION:  Continue progressive strengthening/balance with inclusion of progression of cardiovascular endurance improvements.      Chyrel Masson, PT, DPT, OCS, ATC 09/12/22  10:55 AM

## 2022-09-13 ENCOUNTER — Ambulatory Visit (INDEPENDENT_AMBULATORY_CARE_PROVIDER_SITE_OTHER): Payer: Medicare PPO | Admitting: Rehabilitative and Restorative Service Providers"

## 2022-09-13 ENCOUNTER — Encounter: Payer: Self-pay | Admitting: Rehabilitative and Restorative Service Providers"

## 2022-09-13 DIAGNOSIS — R262 Difficulty in walking, not elsewhere classified: Secondary | ICD-10-CM | POA: Diagnosis not present

## 2022-09-13 DIAGNOSIS — M6281 Muscle weakness (generalized): Secondary | ICD-10-CM

## 2022-09-13 DIAGNOSIS — R600 Localized edema: Secondary | ICD-10-CM | POA: Diagnosis not present

## 2022-09-13 DIAGNOSIS — M25561 Pain in right knee: Secondary | ICD-10-CM | POA: Diagnosis not present

## 2022-09-14 ENCOUNTER — Encounter (HOSPITAL_COMMUNITY): Payer: Self-pay

## 2022-09-15 ENCOUNTER — Encounter (HOSPITAL_COMMUNITY): Payer: Self-pay

## 2022-09-15 ENCOUNTER — Encounter: Payer: Medicare PPO | Admitting: Rehabilitative and Restorative Service Providers"

## 2022-09-15 DIAGNOSIS — M25511 Pain in right shoulder: Secondary | ICD-10-CM | POA: Diagnosis not present

## 2022-09-15 DIAGNOSIS — G8929 Other chronic pain: Secondary | ICD-10-CM | POA: Diagnosis not present

## 2022-09-18 ENCOUNTER — Other Ambulatory Visit: Payer: Self-pay

## 2022-09-18 ENCOUNTER — Inpatient Hospital Stay (HOSPITAL_BASED_OUTPATIENT_CLINIC_OR_DEPARTMENT_OTHER)
Admission: EM | Admit: 2022-09-18 | Discharge: 2022-09-22 | DRG: 194 | Disposition: A | Discharge status: Home / Self Care | Payer: Medicare PPO | Attending: Internal Medicine | Admitting: Internal Medicine

## 2022-09-18 ENCOUNTER — Encounter (HOSPITAL_BASED_OUTPATIENT_CLINIC_OR_DEPARTMENT_OTHER): Payer: Self-pay | Admitting: Emergency Medicine

## 2022-09-18 ENCOUNTER — Encounter (HOSPITAL_COMMUNITY): Payer: Self-pay

## 2022-09-18 ENCOUNTER — Emergency Department (HOSPITAL_BASED_OUTPATIENT_CLINIC_OR_DEPARTMENT_OTHER): Payer: Medicare PPO | Admitting: Radiology

## 2022-09-18 DIAGNOSIS — R1032 Left lower quadrant pain: Secondary | ICD-10-CM | POA: Diagnosis not present

## 2022-09-18 DIAGNOSIS — G4733 Obstructive sleep apnea (adult) (pediatric): Secondary | ICD-10-CM | POA: Diagnosis present

## 2022-09-18 DIAGNOSIS — R059 Cough, unspecified: Secondary | ICD-10-CM | POA: Diagnosis not present

## 2022-09-18 DIAGNOSIS — N185 Chronic kidney disease, stage 5: Secondary | ICD-10-CM | POA: Diagnosis not present

## 2022-09-18 DIAGNOSIS — M329 Systemic lupus erythematosus, unspecified: Secondary | ICD-10-CM

## 2022-09-18 DIAGNOSIS — D631 Anemia in chronic kidney disease: Secondary | ICD-10-CM | POA: Diagnosis not present

## 2022-09-18 DIAGNOSIS — R509 Fever, unspecified: Secondary | ICD-10-CM | POA: Diagnosis not present

## 2022-09-18 DIAGNOSIS — K449 Diaphragmatic hernia without obstruction or gangrene: Secondary | ICD-10-CM | POA: Diagnosis present

## 2022-09-18 DIAGNOSIS — R609 Edema, unspecified: Secondary | ICD-10-CM | POA: Diagnosis not present

## 2022-09-18 DIAGNOSIS — R042 Hemoptysis: Secondary | ICD-10-CM | POA: Diagnosis present

## 2022-09-18 DIAGNOSIS — R079 Chest pain, unspecified: Secondary | ICD-10-CM

## 2022-09-18 DIAGNOSIS — R0602 Shortness of breath: Secondary | ICD-10-CM | POA: Diagnosis not present

## 2022-09-18 DIAGNOSIS — Z8701 Personal history of pneumonia (recurrent): Secondary | ICD-10-CM

## 2022-09-18 DIAGNOSIS — E669 Obesity, unspecified: Secondary | ICD-10-CM | POA: Diagnosis present

## 2022-09-18 DIAGNOSIS — M810 Age-related osteoporosis without current pathological fracture: Secondary | ICD-10-CM | POA: Diagnosis present

## 2022-09-18 DIAGNOSIS — E871 Hypo-osmolality and hyponatremia: Secondary | ICD-10-CM | POA: Diagnosis present

## 2022-09-18 DIAGNOSIS — Z881 Allergy status to other antibiotic agents status: Secondary | ICD-10-CM

## 2022-09-18 DIAGNOSIS — N250 Renal osteodystrophy: Secondary | ICD-10-CM | POA: Diagnosis not present

## 2022-09-18 DIAGNOSIS — Z1152 Encounter for screening for COVID-19: Secondary | ICD-10-CM

## 2022-09-18 DIAGNOSIS — N281 Cyst of kidney, acquired: Secondary | ICD-10-CM | POA: Diagnosis not present

## 2022-09-18 DIAGNOSIS — J189 Pneumonia, unspecified organism: Secondary | ICD-10-CM | POA: Diagnosis not present

## 2022-09-18 DIAGNOSIS — Z993 Dependence on wheelchair: Secondary | ICD-10-CM

## 2022-09-18 DIAGNOSIS — R7989 Other specified abnormal findings of blood chemistry: Secondary | ICD-10-CM | POA: Diagnosis not present

## 2022-09-18 DIAGNOSIS — I10 Essential (primary) hypertension: Secondary | ICD-10-CM | POA: Diagnosis present

## 2022-09-18 DIAGNOSIS — Z91199 Patient's noncompliance with other medical treatment and regimen due to unspecified reason: Secondary | ICD-10-CM

## 2022-09-18 DIAGNOSIS — E872 Acidosis, unspecified: Secondary | ICD-10-CM | POA: Diagnosis not present

## 2022-09-18 DIAGNOSIS — M3214 Glomerular disease in systemic lupus erythematosus: Secondary | ICD-10-CM | POA: Diagnosis not present

## 2022-09-18 DIAGNOSIS — R0789 Other chest pain: Secondary | ICD-10-CM | POA: Diagnosis not present

## 2022-09-18 DIAGNOSIS — Z6835 Body mass index (BMI) 35.0-35.9, adult: Secondary | ICD-10-CM

## 2022-09-18 DIAGNOSIS — Z8249 Family history of ischemic heart disease and other diseases of the circulatory system: Secondary | ICD-10-CM

## 2022-09-18 DIAGNOSIS — E869 Volume depletion, unspecified: Secondary | ICD-10-CM | POA: Diagnosis present

## 2022-09-18 DIAGNOSIS — R918 Other nonspecific abnormal finding of lung field: Secondary | ICD-10-CM | POA: Diagnosis not present

## 2022-09-18 DIAGNOSIS — G47419 Narcolepsy without cataplexy: Secondary | ICD-10-CM | POA: Diagnosis present

## 2022-09-18 DIAGNOSIS — J45909 Unspecified asthma, uncomplicated: Secondary | ICD-10-CM | POA: Diagnosis present

## 2022-09-18 DIAGNOSIS — K219 Gastro-esophageal reflux disease without esophagitis: Secondary | ICD-10-CM | POA: Diagnosis present

## 2022-09-18 DIAGNOSIS — I160 Hypertensive urgency: Secondary | ICD-10-CM | POA: Diagnosis not present

## 2022-09-18 DIAGNOSIS — I120 Hypertensive chronic kidney disease with stage 5 chronic kidney disease or end stage renal disease: Secondary | ICD-10-CM | POA: Diagnosis not present

## 2022-09-18 DIAGNOSIS — Z96642 Presence of left artificial hip joint: Secondary | ICD-10-CM | POA: Diagnosis present

## 2022-09-18 DIAGNOSIS — M109 Gout, unspecified: Secondary | ICD-10-CM | POA: Diagnosis present

## 2022-09-18 DIAGNOSIS — Z79899 Other long term (current) drug therapy: Secondary | ICD-10-CM

## 2022-09-18 DIAGNOSIS — N179 Acute kidney failure, unspecified: Secondary | ICD-10-CM | POA: Diagnosis present

## 2022-09-18 DIAGNOSIS — Z833 Family history of diabetes mellitus: Secondary | ICD-10-CM

## 2022-09-18 DIAGNOSIS — J168 Pneumonia due to other specified infectious organisms: Secondary | ICD-10-CM | POA: Diagnosis not present

## 2022-09-18 DIAGNOSIS — IMO0002 Reserved for concepts with insufficient information to code with codable children: Secondary | ICD-10-CM | POA: Diagnosis present

## 2022-09-18 DIAGNOSIS — Z83438 Family history of other disorder of lipoprotein metabolism and other lipidemia: Secondary | ICD-10-CM

## 2022-09-18 DIAGNOSIS — Z882 Allergy status to sulfonamides status: Secondary | ICD-10-CM

## 2022-09-18 DIAGNOSIS — N189 Chronic kidney disease, unspecified: Secondary | ICD-10-CM | POA: Diagnosis not present

## 2022-09-18 HISTORY — DX: Chronic kidney disease, stage 5: N18.5

## 2022-09-18 LAB — TROPONIN I (HIGH SENSITIVITY)
Troponin I (High Sensitivity): 13 ng/L (ref <18)
Troponin I (High Sensitivity): 14 ng/L (ref <18)

## 2022-09-18 LAB — MRSA NEXT GEN BY PCR, NASAL: MRSA by PCR Next Gen: NOT DETECTED

## 2022-09-18 LAB — CBC WITH DIFFERENTIAL/PLATELET
Abs Immature Granulocytes: 0.08 10*3/uL — ABNORMAL HIGH (ref 0.00–0.07)
Basophils Absolute: 0 10*3/uL (ref 0.0–0.1)
Basophils Relative: 0 %
Eosinophils Absolute: 0 10*3/uL (ref 0.0–0.5)
Eosinophils Relative: 0 %
HCT: 30.8 % — ABNORMAL LOW (ref 36.0–46.0)
Hemoglobin: 10.2 g/dL — ABNORMAL LOW (ref 12.0–15.0)
Immature Granulocytes: 1 %
Lymphocytes Relative: 2 %
Lymphs Abs: 0.3 10*3/uL — ABNORMAL LOW (ref 0.7–4.0)
MCH: 28.7 pg (ref 26.0–34.0)
MCHC: 33.1 g/dL (ref 30.0–36.0)
MCV: 86.8 fL (ref 80.0–100.0)
Monocytes Absolute: 1.2 10*3/uL — ABNORMAL HIGH (ref 0.1–1.0)
Monocytes Relative: 9 %
Neutro Abs: 11.9 10*3/uL — ABNORMAL HIGH (ref 1.7–7.7)
Neutrophils Relative %: 88 %
Platelets: 219 10*3/uL (ref 150–400)
RBC: 3.55 MIL/uL — ABNORMAL LOW (ref 3.87–5.11)
RDW: 15.9 % — ABNORMAL HIGH (ref 11.5–15.5)
WBC: 13.5 10*3/uL — ABNORMAL HIGH (ref 4.0–10.5)
nRBC: 0 % (ref 0.0–0.2)

## 2022-09-18 LAB — URINALYSIS, COMPLETE (UACMP) WITH MICROSCOPIC
Bilirubin Urine: NEGATIVE
Glucose, UA: NEGATIVE mg/dL
Ketones, ur: NEGATIVE mg/dL
Nitrite: NEGATIVE
Protein, ur: 100 mg/dL — AB
Specific Gravity, Urine: 1.011 (ref 1.005–1.030)
pH: 5 (ref 5.0–8.0)

## 2022-09-18 LAB — D-DIMER, QUANTITATIVE: D-Dimer, Quant: 1.67 ug{FEU}/mL — ABNORMAL HIGH (ref 0.00–0.50)

## 2022-09-18 LAB — COMPREHENSIVE METABOLIC PANEL
ALT: 13 U/L (ref 0–44)
AST: 15 U/L (ref 15–41)
Albumin: 3.5 g/dL (ref 3.5–5.0)
Alkaline Phosphatase: 103 U/L (ref 38–126)
Anion gap: 11 (ref 5–15)
BUN: 80 mg/dL — ABNORMAL HIGH (ref 8–23)
CO2: 16 mmol/L — ABNORMAL LOW (ref 22–32)
Calcium: 12.6 mg/dL — ABNORMAL HIGH (ref 8.9–10.3)
Chloride: 105 mmol/L (ref 98–111)
Creatinine, Ser: 6.24 mg/dL — ABNORMAL HIGH (ref 0.44–1.00)
GFR, Estimated: 7 mL/min — ABNORMAL LOW (ref >60)
Glucose, Bld: 104 mg/dL — ABNORMAL HIGH (ref 70–99)
Potassium: 4.7 mmol/L (ref 3.5–5.1)
Sodium: 132 mmol/L — ABNORMAL LOW (ref 135–145)
Total Bilirubin: 0.4 mg/dL (ref 0.3–1.2)
Total Protein: 7.5 g/dL (ref 6.5–8.1)

## 2022-09-18 LAB — LACTIC ACID, PLASMA: Lactic Acid, Venous: 0.5 mmol/L (ref 0.5–1.9)

## 2022-09-18 LAB — HEPARIN LEVEL (UNFRACTIONATED): Heparin Unfractionated: 0.26 [IU]/mL — ABNORMAL LOW (ref 0.30–0.70)

## 2022-09-18 LAB — SODIUM, URINE, RANDOM: Sodium, Ur: 34 mmol/L

## 2022-09-18 LAB — SARS CORONAVIRUS 2 BY RT PCR: SARS Coronavirus 2 by RT PCR: NEGATIVE

## 2022-09-18 LAB — CREATININE, URINE, RANDOM: Creatinine, Urine: 105 mg/dL

## 2022-09-18 LAB — STREP PNEUMONIAE URINARY ANTIGEN: Strep Pneumo Urinary Antigen: NEGATIVE

## 2022-09-18 MED ORDER — SODIUM CHLORIDE 0.9 % IV SOLN
2.0000 g | Freq: Once | INTRAVENOUS | Status: AC
  Administered 2022-09-18: 2 g via INTRAVENOUS
  Filled 2022-09-18: qty 20

## 2022-09-18 MED ORDER — LABETALOL HCL 200 MG PO TABS
300.0000 mg | ORAL_TABLET | Freq: Two times a day (BID) | ORAL | Status: DC
  Administered 2022-09-18 – 2022-09-22 (×8): 300 mg via ORAL
  Filled 2022-09-18 (×8): qty 2

## 2022-09-18 MED ORDER — FELODIPINE ER 5 MG PO TB24
10.0000 mg | ORAL_TABLET | Freq: Every day | ORAL | Status: DC
  Administered 2022-09-19 – 2022-09-22 (×4): 10 mg via ORAL
  Filled 2022-09-18 (×4): qty 2

## 2022-09-18 MED ORDER — HEPARIN (PORCINE) 25000 UT/250ML-% IV SOLN
1350.0000 [IU]/h | INTRAVENOUS | Status: DC
  Administered 2022-09-18: 1200 [IU]/h via INTRAVENOUS
  Administered 2022-09-19: 1350 [IU]/h via INTRAVENOUS
  Filled 2022-09-18 (×2): qty 250

## 2022-09-18 MED ORDER — SODIUM CHLORIDE 0.9 % IV BOLUS
1000.0000 mL | Freq: Once | INTRAVENOUS | Status: AC
  Administered 2022-09-18: 1000 mL via INTRAVENOUS

## 2022-09-18 MED ORDER — SODIUM CHLORIDE 0.9% FLUSH
3.0000 mL | Freq: Two times a day (BID) | INTRAVENOUS | Status: DC
  Administered 2022-09-19 – 2022-09-22 (×6): 3 mL via INTRAVENOUS

## 2022-09-18 MED ORDER — FLUTICASONE FUROATE-VILANTEROL 100-25 MCG/ACT IN AEPB
1.0000 | INHALATION_SPRAY | Freq: Every day | RESPIRATORY_TRACT | Status: DC
  Administered 2022-09-19 – 2022-09-20 (×2): 1 via RESPIRATORY_TRACT
  Filled 2022-09-18: qty 28

## 2022-09-18 MED ORDER — DOXYCYCLINE HYCLATE 100 MG PO TABS: 100.0000 mg | ORAL_TABLET | Freq: Two times a day (BID) | ORAL | Status: DC

## 2022-09-18 MED ORDER — ALBUTEROL SULFATE HFA 108 (90 BASE) MCG/ACT IN AERS: 2.0000 | INHALATION_SPRAY | Freq: Four times a day (QID) | RESPIRATORY_TRACT | Status: DC | PRN

## 2022-09-18 MED ORDER — HEPARIN BOLUS VIA INFUSION
4000.0000 [IU] | Freq: Once | INTRAVENOUS | Status: AC
  Administered 2022-09-18: 4000 [IU] via INTRAVENOUS

## 2022-09-18 MED ORDER — HYDROMORPHONE HCL 1 MG/ML IJ SOLN: 0.5000 mg | INTRAMUSCULAR | Status: DC | PRN

## 2022-09-18 MED ORDER — CHLORHEXIDINE GLUCONATE CLOTH 2 % EX PADS
6.0000 | MEDICATED_PAD | Freq: Every day | CUTANEOUS | Status: DC
  Administered 2022-09-19 – 2022-09-21 (×3): 6 via TOPICAL

## 2022-09-18 MED ORDER — OXYCODONE HCL 5 MG PO TABS
5.0000 mg | ORAL_TABLET | Freq: Four times a day (QID) | ORAL | Status: DC | PRN
  Administered 2022-09-19: 5 mg via ORAL
  Filled 2022-09-18: qty 1

## 2022-09-18 MED ORDER — HYDROXYCHLOROQUINE SULFATE 200 MG PO TABS
200.0000 mg | ORAL_TABLET | Freq: Every day | ORAL | Status: DC
  Administered 2022-09-19 – 2022-09-22 (×4): 200 mg via ORAL
  Filled 2022-09-18 (×4): qty 1

## 2022-09-18 MED ORDER — ALLOPURINOL 300 MG PO TABS
250.0000 mg | ORAL_TABLET | Freq: Every day | ORAL | Status: DC
  Administered 2022-09-19 – 2022-09-22 (×4): 250 mg via ORAL
  Filled 2022-09-18 (×2): qty 3
  Filled 2022-09-18 (×2): qty 1

## 2022-09-18 MED ORDER — LIDOCAINE 5 % EX PTCH
1.0000 | MEDICATED_PATCH | CUTANEOUS | Status: DC
  Administered 2022-09-18 – 2022-09-22 (×5): 1 via TRANSDERMAL
  Filled 2022-09-18 (×5): qty 1

## 2022-09-18 MED ORDER — ACETAMINOPHEN 650 MG RE SUPP: 650.0000 mg | Freq: Four times a day (QID) | RECTAL | Status: DC | PRN

## 2022-09-18 MED ORDER — STERILE WATER FOR INJECTION IV SOLN
INTRAVENOUS | Status: AC
  Filled 2022-09-18: qty 1000
  Filled 2022-09-18: qty 150
  Filled 2022-09-18: qty 1000

## 2022-09-18 MED ORDER — ACETAMINOPHEN 325 MG PO TABS: 650.0000 mg | ORAL_TABLET | Freq: Four times a day (QID) | ORAL | Status: DC | PRN

## 2022-09-18 MED ORDER — SODIUM CHLORIDE 0.9 % IV SOLN
2.0000 g | INTRAVENOUS | Status: AC
  Administered 2022-09-19 – 2022-09-22 (×4): 2 g via INTRAVENOUS
  Filled 2022-09-18 (×4): qty 20

## 2022-09-18 MED ORDER — HYDROCODONE-ACETAMINOPHEN 5-325 MG PO TABS
1.0000 | ORAL_TABLET | Freq: Once | ORAL | Status: AC
  Administered 2022-09-18: 1 via ORAL
  Filled 2022-09-18: qty 1

## 2022-09-18 MED ORDER — DOXYCYCLINE HYCLATE 100 MG PO TABS
100.0000 mg | ORAL_TABLET | Freq: Once | ORAL | Status: AC
  Administered 2022-09-18: 100 mg via ORAL
  Filled 2022-09-18: qty 1

## 2022-09-18 NOTE — Progress Notes (Signed)
Admit for multifocal pneumonia, Aki and pleuritic chest pain. Given elevated d-dimer, pleuritic chest pain and inability to get CTA due to AKI, she was accepted for admission and I did recommend starting Heparin gtt until VQ can be obtained.

## 2022-09-18 NOTE — ED Notes (Signed)
Kim at  [facility] called for Bed Ready at [facility] 2W RM# [phone])

## 2022-09-18 NOTE — Progress Notes (Signed)
ANTICOAGULATION CONSULT NOTE - Initial Consult  Pharmacy Consult for IV heparin Indication: pulmonary embolus  Allergies  Allergen Reactions   Erythromycin Nausea And Vomiting   Levofloxacin     Causes muscle pain    Sulfa Antibiotics Hives    Patient Measurements: Height: 5\' 2"  (157.5 cm) Weight: 88 kg (194 lb 0.1 oz) IBW/kg (Calculated) : 50.1 Heparin Dosing Weight: 70.2 kg  Vital Signs: Temp: 98 F (36.7 C) (08/18 2006) Temp Source: Oral (08/18 2006) BP: 168/88 (08/18 2000) Pulse Rate: 95 (08/18 2000)  Labs: Recent Labs    09/18/22 1231 09/18/22 1451 09/18/22 2109  HGB 10.2*  --   --   HCT 30.8*  --   --   PLT 219  --   --   HEPARINUNFRC  --   --  0.26*  CREATININE 6.24*  --   --   TROPONINIHS 13 14  --     Estimated Creatinine Clearance: 9.1 mL/min (A) (by C-G formula based on SCr of 6.24 mg/dL (H)).   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 year old female admitted on 09/18/2022 with concern for PE. No anticoagulation prior to admission. Patient to be transferred to [facility] for VQ scan. Pharmacy consulted to dose heparin.  09/18/2022:  Initial heparin level 0.26 on IV heparin 1200 units/hr CBC: Hg slightly low at 10.2, pltc WNL No bleeding or infusion related concerns reported by RN VQ scan pending  Goal of Therapy:  Heparin level 0.3-0.7 units/ml Monitor platelets by  anticoagulation protocol: Yes   Plan:  Increase heparin infusion to 1350 units/hr Recheck heparin level 6h after rate change  Daily heparin level, CBC, and monitoring for bleeding F/u VQ scan, plans for anticoagulation   Thank you for allowing pharmacy to participate in this patient's care.  Junita Push, PharmD, BCPS 09/18/2022,9:43 PM

## 2022-09-18 NOTE — ED Triage Notes (Signed)
Woke up Thursday with fatigue, headache, joints,cough with sputum. Fever, subjective.

## 2022-09-18 NOTE — H&P (Signed)
History and Physical    Colleen Obrien XBM:[id number] DOB: [birth year] DOA: 09/18/2022  PCP: [name], MD   Patient coming from: Home   Chief Complaint: Cough, pleuritic pain, subjective fever, fatigue, nausea    HPI: Colleen Obrien is a pleasant 67 y.o. 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  Patient had been in her usual state until the morning of 09/15/2022 when she developed fatigue, productive cough, nausea, and subjective fever.  Symptoms have continued to worsen and she is experiencing pleuritic pain in the left lateral chest.  She denies any recent leg swelling.  She is followed by transplant center at [facility] and being considered for renal transplantation.  She also follows with local nephrologist, Dr. Marisue Humble, and has discussed possibly needing dialysis prior to transplant.[facility] ED Course: Upon arrival to the ED, patient is found to be afebrile and saturating low 90s on room air with tachypnea, normal heart rate, and stable blood pressure.  Chest x-ray is concerning for multifocal pneumonia.  Labs are most notable for serum bicarbonate 16, BUN 80, creatinine 6.24, WBC 13,500, D-dimer 1.67, normal lactic acid, normal troponin, and negative COVID-19 PCR.   Blood cultures were collected in the ED, patient was started on IV heparin infusion, and she was treated with 1 L of normal saline, Norco, Rocephin, and doxycycline.  She was transferred to [facility] for admission.  Review of Systems:  All other systems reviewed and apart from HPI, are negative.  Past Medical History:  Diagnosis Date   Anemia in chronic kidney disease (CKD)    Arthritis    Asthma    Blood dyscrasia    systemic lupus   Chronic kidney disease (CKD), active medical management without  dialysis, stage 4 (severe) (HCC)    Dyspnea    GERD (gastroesophageal reflux disease)    Gout    Hypertension    Lupus (HCC)    Narcolepsy    Osteoporosis 05/05/2022   Pneumonia    x 1   Sleep apnea    tries to use CPAP nightly as of 04/29/22   Walker as Engineer, water dependent    when going outside home    Past Surgical History:  Procedure Laterality Date   CESAREAN SECTION     x 2   COLONOSCOPY     ganglion cyst removed Right    arm   TIBIA IM NAIL INSERTION Right 05/03/2022   Procedure: INTRAMEDULLARY (IM) NAIL TIBIAL;  Surgeon: Myrene Galas, MD;  Location: [facility] OR;  Service: Orthopedics;  Laterality: Right;   TOTAL HIP ARTHROPLASTY Left 08/26/2019   Procedure: LEFT TOTAL HIP ARTHROPLASTY ANTERIOR APPROACH;  Surgeon: Tarry Kos, MD;  Location: [facility] OR;  Service: Orthopedics;  Laterality: Left;   TUBAL LIGATION      Social History:   reports that she has never smoked. She has never used smokeless tobacco. She reports that she does not drink alcohol and does not use drugs.  Allergies  Allergen Reactions   Erythromycin Nausea And Vomiting   Levofloxacin     Causes muscle pain    Sulfa Antibiotics Hives    Family History  Problem Relation Age of Onset   Hypertension Mother    Diabetes Mother    Dementia Father    Hypertension Father    Hyperlipidemia Father  Diabetes Paternal Grandmother    Alcohol abuse Paternal Grandfather      Prior to Admission medications   Medication Sig Start Date End Date Taking? Authorizing Provider  acetaminophen (TYLENOL) 500 MG tablet Take 500 mg by mouth every 6 (six) hours as needed (for pain.).   Yes [provider]  albuterol (PROVENTIL HFA;VENTOLIN HFA) 108 (90 BASE) MCG/ACT inhaler Inhale 2 puffs into the lungs every 6 (six) hours as needed for wheezing or shortness of breath.   Yes [provider]  allopurinol (ZYLOPRIM) 100 MG tablet Take 250 mg by mouth daily.   Yes [provider]   budesonide-formoterol (SYMBICORT) 80-4.5 MCG/ACT inhaler Inhale 2 puffs into the lungs 2 (two) times daily as needed (asthma).   Yes [provider]  denosumab (PROLIA) 60 MG/ML SOLN injection Inject 60 mg into the skin every 6 (six) months. Administer in upper arm, thigh, or abdomen   Yes [provider]  felodipine (PLENDIL) 10 MG 24 hr tablet Take 10 mg by mouth daily. 01/30/21  Yes [provider]  furosemide (LASIX) 40 MG tablet Take 40 mg by mouth daily as needed (fluid retention.).   Yes [provider]  hydroxychloroquine (PLAQUENIL) 200 MG tablet Take 200 mg by mouth daily.    Yes [provider]  labetalol (NORMODYNE) 300 MG tablet Take 300 mg by mouth 2 (two) times daily. 04/11/22  Yes [provider]  Vitamin D, Ergocalciferol, (DRISDOL) 1.25 MG (50000 UNIT) CAPS capsule Take 1 capsule (50,000 Units total) by mouth every 7 (seven) days. Patient taking differently: Take 50,000 Units by mouth every 7 (seven) days. fridays 12/29/21  Yes Tarry Kos, MD  apixaban (ELIQUIS) 2.5 MG TABS tablet Take 1 tablet (2.5 mg total) by mouth 2 (two) times daily. Patient not taking: Reported on 09/18/2022 05/05/22 06/04/22  Montez Morita, PA-C  colchicine 0.6 MG tablet Take 0.6 mg by mouth every other day. Patient not taking: Reported on 09/18/2022    [provider]    Physical Exam: Vitals:   09/18/22 1507 09/18/22 1530 09/18/22 1549 09/18/22 1600  BP: 137/82     Pulse: 80 82    Resp:      Temp:   97.7 F (36.5 C) 97.6 F (36.4 C)  TempSrc:   Oral Oral  SpO2: 94% 95%    Weight:      Height:         Constitutional: NAD, calm  Eyes: PERTLA, lids and conjunctivae normal ENMT: Mucous membranes are moist. Posterior pharynx clear of any exudate or lesions.   Neck: supple, no masses  Respiratory: Coarse rales on left. No accessory muscle use.  Cardiovascular: S1 & S2 heard, regular rate and rhythm. No extremity edema.   Abdomen: No  distension, no tenderness, soft. Bowel sounds active.  Musculoskeletal: no clubbing / cyanosis. No joint deformity upper and lower extremities.   Skin: no significant rashes, lesions, ulcers. Warm, dry, well-perfused. Neurologic: CN 2-12 grossly intact. Moving all extremities. Alert and oriented.  Psychiatric: Pleasant. Cooperative.    Labs and Imaging on Admission: I have personally reviewed following labs and imaging studies  CBC: Recent Labs  Lab 09/18/22 1231  WBC 13.5*  NEUTROABS 11.9*  HGB 10.2*  HCT 30.8*  MCV 86.8  PLT 219   Basic Metabolic Panel: Recent Labs  Lab 09/18/22 1231  NA 132*  K 4.7  CL 105  CO2 16*  GLUCOSE 104*  BUN 80*  CREATININE 6.24*  CALCIUM 12.6*  GFR: Estimated Creatinine Clearance: 9.1 mL/min (A) (by C-G formula based on SCr of 6.24 mg/dL (H)). Liver Function Tests: Recent Labs  Lab 09/18/22 1231  AST 15  ALT 13  ALKPHOS 103  BILITOT 0.4  PROT 7.5  ALBUMIN 3.5   No results for input(s): "LIPASE", "AMYLASE" in the last 168 hours. No results for input(s): "AMMONIA" in the last 168 hours. Coagulation Profile: No results for input(s): "INR", "PROTIME" in the last 168 hours. Cardiac Enzymes: No results for input(s): "CKTOTAL", "CKMB", "CKMBINDEX", "TROPONINI" in the last 168 hours. BNP (last 3 results) No results for input(s): "PROBNP" in the last 8760 hours. HbA1C: No results for input(s): "HGBA1C" in the last 72 hours. CBG: No results for input(s): "GLUCAP" in the last 168 hours. Lipid Profile: No results for input(s): "CHOL", "HDL", "LDLCALC", "TRIG", "CHOLHDL", "LDLDIRECT" in the last 72 hours. Thyroid Function Tests: No results for input(s): "TSH", "T4TOTAL", "FREET4", "T3FREE", "THYROIDAB" in the last 72 hours. Anemia Panel: No results for input(s): "VITAMINB12", "FOLATE", "FERRITIN", "TIBC", "IRON", "RETICCTPCT" in the last 72 hours. Urine analysis:    Component Value Date/Time   COLORURINE STRAW (A) 08/21/2019 0953    APPEARANCEUR CLEAR 08/21/2019 0953   LABSPEC 1.008 08/21/2019 0953   PHURINE 6.0 08/21/2019 0953   GLUCOSEU NEGATIVE 08/21/2019 0953   HGBUR NEGATIVE 08/21/2019 0953   BILIRUBINUR NEGATIVE 08/21/2019 0953   BILIRUBINUR neg 12/18/2012 1817   KETONESUR NEGATIVE 08/21/2019 0953   PROTEINUR NEGATIVE 08/21/2019 0953   UROBILINOGEN 0.2 12/18/2012 1817   NITRITE NEGATIVE 08/21/2019 0953   LEUKOCYTESUR NEGATIVE 08/21/2019 0953   Sepsis Labs: @LABRCNTIP (procalcitonin:4,lacticidven:4) ) Recent Results (from the past 240 hour(s))  SARS Coronavirus 2 by RT PCR (hospital order, performed in [facility] hospital lab) *cepheid single result test* Anterior Nasal Swab     Status: None   Collection Time: 09/18/22 12:02 PM   Specimen: Anterior Nasal Swab  Result Value Ref Range Status   SARS Coronavirus 2 by RT PCR NEGATIVE NEGATIVE Final    Comment: (NOTE) SARS-CoV-2 target nucleic acids are NOT DETECTED.  The SARS-CoV-2 RNA is generally detectable in upper and lower respiratory specimens during the acute phase of infection. The lowest concentration of SARS-CoV-2 viral copies this assay can detect is 250 copies / mL. A negative result does not preclude SARS-CoV-2 infection and should not be used as the sole basis for treatment or other patient management decisions.  A negative result may occur with improper specimen collection / handling, submission of specimen other than nasopharyngeal swab, presence of viral mutation(s) within the areas targeted by this assay, and inadequate number of viral copies (<250 copies / mL). A negative result must be combined with clinical observations, patient history, and epidemiological information.  Fact Sheet for Patients:   RoadLapTop.co.za  Fact Sheet for Healthcare Providers: http://kim-miller.com/  This test is not yet approved or  cleared by the [place] FDA and has been authorized for detection and/or  diagnosis of SARS-CoV-2 by FDA under an Emergency Use Authorization (EUA).  This EUA will remain in effect (meaning this test can be used) for the duration of the COVID-19 declaration under Section 564(b)(1) of the Act, 21 U.S.C. section 360bbb-3(b)(1), unless the authorization is terminated or revoked sooner.  Performed at [facility], [address] [place], [place] [address]      Radiological Exams on Admission: DG Chest 2 View  Result Date: 09/18/2022 CLINICAL DATA:  Left chest pain and cough with body aches and fatigue as well as fever  3-4 days. EXAM: CHEST - 2 VIEW COMPARISON:  08/21/2019 FINDINGS: Lungs are adequately inflated demonstrate moderate airspace consolidation over the left midlung as well as minimally in the lung bases compatible multifocal pneumonia. No effusion. Findings suggesting a hiatal hernia. Cardiomediastinal silhouette and remainder the exam is unchanged. Possible old right humeral neck fracture. IMPRESSION: 1. Moderate airspace consolidation over the left midlung as well as minimally in the lung bases compatible multifocal pneumonia. 2. Hiatal hernia. Electronically Signed   By: Elberta Fortis M.D.   On: 09/18/2022 13:05     Assessment/Plan   1. Pneumonia  - Check strep pneumo and legionella antigens, continue Rocephin and doxycycline, follow cultures and clinical course   2. Elevated D-dimer  - IV heparin started in ED pending V/Q scan    3. AKI superimposed on CKD V; metabolic acidosis  - BUN is 80 and SCr 6.24 on admission, up from 60 & 5.61 two wks ago; serum bicarbonate is 16  - She was given 1 liter NS in ED  - Check UA and urine chemistries, continue IVF hydration with isotonic bicarbonate, renally-dose medications, repeat chem panel in am   4. Hypertension  - Continue labetalol and felodipine    5. Asthma; OSA  - Not in exacerbation on admission   - Continue ICS-LABA and as-needed albuterol    6. Lupus  - Managed with  Plaquenil     DVT prophylaxis: IV heparin  Code Status: Full  Level of Care: Level of care: Stepdown Family Communication: Husband at bedside  Disposition Plan:  Patient is from: Home  Anticipated d/c is to: Home  Anticipated d/c date is: 09/21/22  Patient currently: Pending improved respiratory and renal function, V/Q scan  Consults called: None  Admission status: Inpatient     Briscoe Deutscher, MD [facility]  09/18/2022, 4:40 PM

## 2022-09-18 NOTE — Progress Notes (Signed)
ANTICOAGULATION CONSULT NOTE - Initial Consult  Pharmacy Consult for IV heparin Indication: pulmonary embolus  Allergies  Allergen Reactions   Erythromycin Nausea And Vomiting   Levofloxacin     Causes muscle pain    Sulfa Antibiotics Hives    Patient Measurements: Height: 5\' 2"  (157.5 cm) Weight: 88 kg (194 lb 0.1 oz) IBW/kg (Calculated) : 50.1 Heparin Dosing Weight: 70.2 kg  Vital Signs: Temp: 97.7 F (36.5 C) (08/18 1549) Temp Source: Oral (08/18 1549) BP: 137/82 (08/18 1507) Pulse Rate: 82 (08/18 1530)  Labs: Recent Labs    09/18/22 1231 09/18/22 1451  HGB 10.2*  --   HCT 30.8*  --   PLT 219  --   CREATININE 6.24*  --   TROPONINIHS 13 14    Estimated Creatinine Clearance: 9.1 mL/min (A) (by C-G formula based on SCr of 6.24 mg/dL (H)).   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 year old female admitted on 09/18/2022 with concern for PE. No anticoagulation prior to admission. Patient to be transferred to [facility] for VQ scan. Pharmacy consulted to dose heparin.  Goal of Therapy:  Heparin level 0.3-0.7 units/ml Monitor platelets by anticoagulation protocol: Yes   Plan:  Heparin 4000 units x 1 as bolus followed by heparin infusion at 1200 units/hr 6h heparin level   Daily heparin level, CBC, and monitoring for bleeding F/u plans for anticoagulation   Thank you for allowing pharmacy to  participate in this patient's care.  Marja Kays, PharmD Emergency Medicine Clinical Pharmacist 09/18/2022,4:08 PM

## 2022-09-18 NOTE — ED Provider Notes (Signed)
[facility] AT [facility] Provider Note   CSN: [id number] Arrival date & time: 09/18/22  1128     History  Chief Complaint  Patient presents with   Shortness of Breath    Colleen Obrien is a 67 y.o. female.  67 year old female with a history of hypertension, lupus, and CKD not on dialysis who presents emergency department with left-sided chest pain, cough, and hemoptysis.  Patient reports that since Thursday has been having symptoms.  Has had left side pain, cough productive of small amount of bloody sputum, nausea, congestion, and subjective fevers at home.  Has had left-sided chest pain that she says has been present for some time but getting worse.  Thinks it has been several months.  Worsened with deep breathing.  No recent surgeries.  No personal history of cancer or DVT or PE.  No personal history of MI.       Home Medications Prior to Admission medications   Medication Sig Start Date End Date Taking? Authorizing Provider  acetaminophen (TYLENOL) 500 MG tablet Take 500 mg by mouth every 6 (six) hours as needed (for pain.).    [provider]  albuterol (PROVENTIL HFA;VENTOLIN HFA) 108 (90 BASE) MCG/ACT inhaler Inhale 2 puffs into the lungs every 6 (six) hours as needed for wheezing or shortness of breath.    [provider]  allopurinol (ZYLOPRIM) 100 MG tablet Take 150 mg by mouth daily.    [provider]  apixaban (ELIQUIS) 2.5 MG TABS tablet Take 1 tablet (2.5 mg total) by mouth 2 (two) times daily. 05/05/22 06/04/22  Montez Morita, PA-C  budesonide-formoterol (SYMBICORT) 80-4.5 MCG/ACT inhaler Inhale 2 puffs into the lungs 2 (two) times daily as needed (asthma).    [provider]  clobetasol ointment (TEMOVATE) 0.05 % Apply 1 Application topically 2 (two) times daily as needed (eczema). 04/18/22   [provider]  colchicine 0.6 MG tablet Take 0.6 mg by mouth every other day.    [provider]  denosumab (PROLIA) 60 MG/ML SOLN injection Inject 60 mg into the skin every 6 (six) months. Administer in upper arm, thigh, or abdomen    [provider]  docusate sodium (COLACE) 100 MG capsule Take 1 capsule (100 mg total) by mouth 2 (two) times daily. 05/05/22   Montez Morita, PA-C  felodipine (PLENDIL) 10 MG 24 hr tablet Take 10 mg by mouth daily. 01/30/21   [provider]  furosemide (LASIX) 40 MG tablet Take 40 mg by mouth daily as needed (fluid retention.).    [provider]  HYDROcodone-acetaminophen (NORCO/VICODIN) 5-325 MG tablet Take 1-2 tablets by mouth every 6 (six) hours as needed for moderate pain or severe pain. 05/05/22   Montez Morita, PA-C  hydroxychloroquine (PLAQUENIL) 200 MG tablet Take 200 mg by mouth daily.     [provider]  labetalol (NORMODYNE) 300 MG tablet Take 300 mg by mouth 2 (two) times daily. 04/11/22   [provider]  methocarbamol (ROBAXIN) 500 MG tablet Take 1 tablet (500 mg total) by mouth every 8 (eight) hours as needed for muscle spasms. 05/05/22   Montez Morita, PA-C  triamcinolone ointment (KENALOG) 0.1 % Apply 1 Application topically 2 (two) times daily as needed (eczema). 01/08/18   [provider]  Vitamin D, Ergocalciferol, (DRISDOL) 1.25 MG (50000 UNIT) CAPS capsule Take 1 capsule (50,000 Units total) by mouth every 7 (seven) days. 12/29/21   Tarry Kos, MD  Allergies    Erythromycin, Levofloxacin, and Sulfa antibiotics    Review of Systems   Review of Systems  Physical Exam Updated Vital Signs BP 129/79   Pulse 71   Temp 98 F (36.7 C) (Oral)   Resp (!) 28   SpO2 94%  Physical Exam Vitals and nursing note reviewed.  Constitutional:      General: She is not in acute distress.    Appearance: She is well-developed.  HENT:     Head: Normocephalic and atraumatic.     Right Ear: External ear normal.     Left Ear: External ear normal.     Nose: Nose normal.  Eyes:      Extraocular Movements: Extraocular movements intact.     Conjunctiva/sclera: Conjunctivae normal.     Pupils: Pupils are equal, round, and reactive to light.  Cardiovascular:     Rate and Rhythm: Normal rate and regular rhythm.     Heart sounds: No murmur heard.    Comments: Left chest wall pain reproducible.  2 cm mobile mass in left chest wall.  Tender to palpation.  Not fluctuant. Pulmonary:     Effort: Pulmonary effort is normal. No respiratory distress.     Breath sounds: Normal breath sounds.  Musculoskeletal:     Cervical back: Normal range of motion and neck supple.     Right lower leg: No edema.     Left lower leg: No edema.  Skin:    General: Skin is warm and dry.  Neurological:     Mental Status: She is alert and oriented to person, place, and time. Mental status is at baseline.  Psychiatric:        Mood and Affect: Mood normal.     ED Results / Procedures / Treatments   Labs (all labs ordered are listed, but only abnormal results are displayed) Labs Reviewed  COMPREHENSIVE METABOLIC PANEL - Abnormal; Notable for the following components:      Result Value   Sodium 132 (*)    CO2 16 (*)    Glucose, Bld 104 (*)    BUN 80 (*)    Creatinine, Ser 6.24 (*)    Calcium 12.6 (*)    GFR, Estimated 7 (*)    All other components within normal limits  CBC WITH DIFFERENTIAL/PLATELET - Abnormal; Notable for the following components:   WBC 13.5 (*)    RBC 3.55 (*)    Hemoglobin 10.2 (*)    HCT 30.8 (*)    RDW 15.9 (*)    Neutro Abs 11.9 (*)    Lymphs Abs 0.3 (*)    Monocytes Absolute 1.2 (*)    Abs Immature Granulocytes 0.08 (*)    All other components within normal limits  D-DIMER, QUANTITATIVE - Abnormal; Notable for the following components:   D-Dimer, Quant 1.67 (*)    All other components within normal limits  SARS CORONAVIRUS 2 BY RT PCR  CULTURE, BLOOD (ROUTINE X 2)  CULTURE, BLOOD (ROUTINE X 2)  LACTIC ACID, PLASMA  LACTIC ACID, PLASMA  TROPONIN I (HIGH  SENSITIVITY)  TROPONIN I (HIGH SENSITIVITY)    EKG None  Radiology DG Chest 2 View  Result Date: 09/18/2022 CLINICAL DATA:  Left chest pain and cough with body aches and fatigue as well as fever 3-4 days. EXAM: CHEST - 2 VIEW COMPARISON:  08/21/2019 FINDINGS: Lungs are adequately inflated demonstrate moderate airspace consolidation over the left midlung as well as minimally in the lung bases compatible multifocal pneumonia.  No effusion. Findings suggesting a hiatal hernia. Cardiomediastinal silhouette and remainder the exam is unchanged. Possible old right humeral neck fracture. IMPRESSION: 1. Moderate airspace consolidation over the left midlung as well as minimally in the lung bases compatible multifocal pneumonia. 2. Hiatal hernia. Electronically Signed   By: Elberta Fortis M.D.   On: 09/18/2022 13:05    Procedures Procedures    Medications Ordered in ED Medications  lidocaine (LIDODERM) 5 % 1 patch (1 patch Transdermal Patch Applied 09/18/22 1221)  cefTRIAXone (ROCEPHIN) 2 g in sodium chloride 0.9 % 100 mL IVPB (has no administration in time range)  doxycycline (VIBRA-TABS) tablet 100 mg (has no administration in time range)  sodium chloride 0.9 % bolus 1,000 mL (has no administration in time range)  HYDROcodone-acetaminophen (NORCO/VICODIN) 5-325 MG per tablet 1 tablet (1 tablet Oral Given 09/18/22 1220)    ED Course/ Medical Decision Making/ A&P Clinical Course as of 09/18/22 1417  Sun Sep 18, 2022  1402 Dr Erenest Blank [RP]    Clinical Course User Index [RP] Rondel Baton, MD                                 Medical Decision Making Amount and/or Complexity of Data Reviewed Labs: ordered. Radiology: ordered.  Risk Prescription drug management. Decision regarding hospitalization.   Magdala Ratliff Murphy-McKellar is a 67 y.o. female with comorbidities that complicate the patient evaluation including hypertension, lupus, and CKD not on dialysis who presents emergency department  with left-sided chest pain, cough, and hemoptysis.     Initial Ddx:  Bronchitis, pneumonia, URI, PE, lung cancer, pericarditis, MI  MDM/Course:  Patient presents to the emergency department with left-sided chest pain, cough, and hemoptysis.  Also is having some URI symptoms.  On exam is satting well on room air but does appear somewhat tachypneic.  Chest pain is reproducible.  Chest x-ray did show left-sided infiltrate that is concerning for pneumonia.  She did have a D-dimer that was elevated.  Because of her renal function today which is somewhat worsened from her baseline unfortunately cannot get a CTA.  She was ordered for VQ scan and was also started on antibiotics for possible pneumonia.  Discussed with hospitalist for admission who also recommended empiric anticoagulation so she has been started on heparin.  Upon re-evaluation patient remained stable and will be admitted to the hospital.  This patient presents to the ED for concern of complaints listed in HPI, this involves an extensive number of treatment options, and is a complaint that carries with it a high risk of complications and morbidity. Disposition including potential need for admission considered.   Dispo: Admit to Floor  Additional history obtained from spouse Records reviewed Outpatient Clinic Notes The following labs were independently interpreted: Chemistry and show CKD I independently reviewed the following imaging with scope of interpretation limited to determining acute life threatening conditions related to emergency care: Chest x-ray and agree with the radiologist interpretation with the following exceptions: none I personally reviewed and interpreted cardiac monitoring: normal sinus rhythm  I personally reviewed and interpreted the pt's EKG: see above for interpretation  I have reviewed the patients home medications and made adjustments as needed Consults: Hospitalist Social Determinants of health:  Elderly  Final  Clinical Impression(s) / ED Diagnoses Final diagnoses:  Pneumonia of left lung due to infectious organism, unspecified part of lung  Hemoptysis  Chest pain, unspecified type  Shortness of breath  Rx / DC Orders ED Discharge Orders     None         Rondel Baton, MD 09/18/22 1418

## 2022-09-18 NOTE — ED Triage Notes (Signed)
Pt also states her left side hurts, feels like its catching when she breathes in. Endorses slight shortness of breath.

## 2022-09-18 NOTE — ED Triage Notes (Signed)
Nausea as well, no diarrhea.

## 2022-09-19 ENCOUNTER — Other Ambulatory Visit: Payer: Self-pay | Admitting: Orthopaedic Surgery

## 2022-09-19 ENCOUNTER — Encounter (HOSPITAL_COMMUNITY): Payer: Self-pay | Admitting: Family Medicine

## 2022-09-19 ENCOUNTER — Inpatient Hospital Stay (HOSPITAL_COMMUNITY): Payer: Medicare PPO

## 2022-09-19 DIAGNOSIS — R079 Chest pain, unspecified: Secondary | ICD-10-CM

## 2022-09-19 DIAGNOSIS — R609 Edema, unspecified: Secondary | ICD-10-CM | POA: Diagnosis not present

## 2022-09-19 DIAGNOSIS — R0602 Shortness of breath: Secondary | ICD-10-CM

## 2022-09-19 DIAGNOSIS — J189 Pneumonia, unspecified organism: Secondary | ICD-10-CM | POA: Diagnosis not present

## 2022-09-19 DIAGNOSIS — R042 Hemoptysis: Secondary | ICD-10-CM | POA: Diagnosis not present

## 2022-09-19 LAB — HEPARIN LEVEL (UNFRACTIONATED)
Heparin Unfractionated: 0.33 [IU]/mL (ref 0.30–0.70)
Heparin Unfractionated: 0.37 [IU]/mL (ref 0.30–0.70)

## 2022-09-19 LAB — BASIC METABOLIC PANEL
Anion gap: 10 (ref 5–15)
BUN: 78 mg/dL — ABNORMAL HIGH (ref 8–23)
CO2: 16 mmol/L — ABNORMAL LOW (ref 22–32)
Calcium: 11.2 mg/dL — ABNORMAL HIGH (ref 8.9–10.3)
Chloride: 108 mmol/L (ref 98–111)
Creatinine, Ser: 5.99 mg/dL — ABNORMAL HIGH (ref 0.44–1.00)
GFR, Estimated: 7 mL/min — ABNORMAL LOW (ref >60)
Glucose, Bld: 95 mg/dL (ref 70–99)
Potassium: 4.4 mmol/L (ref 3.5–5.1)
Sodium: 134 mmol/L — ABNORMAL LOW (ref 135–145)

## 2022-09-19 LAB — CBC
HCT: 29.3 % — ABNORMAL LOW (ref 36.0–46.0)
Hemoglobin: 9.3 g/dL — ABNORMAL LOW (ref 12.0–15.0)
MCH: 28.9 pg (ref 26.0–34.0)
MCHC: 31.7 g/dL (ref 30.0–36.0)
MCV: 91 fL (ref 80.0–100.0)
Platelets: 215 10*3/uL (ref 150–400)
RBC: 3.22 MIL/uL — ABNORMAL LOW (ref 3.87–5.11)
RDW: 15.7 % — ABNORMAL HIGH (ref 11.5–15.5)
WBC: 11.3 10*3/uL — ABNORMAL HIGH (ref 4.0–10.5)
nRBC: 0 % (ref 0.0–0.2)

## 2022-09-19 LAB — HIV ANTIBODY (ROUTINE TESTING W REFLEX): HIV Screen 4th Generation wRfx: NONREACTIVE

## 2022-09-19 LAB — PROCALCITONIN: Procalcitonin: 1.48 ng/mL

## 2022-09-19 LAB — VITAMIN D 25 HYDROXY (VIT D DEFICIENCY, FRACTURES): Vit D, 25-Hydroxy: 62.23 ng/mL (ref 30–100)

## 2022-09-19 MED ORDER — DOXYCYCLINE HYCLATE 100 MG PO TABS
100.0000 mg | ORAL_TABLET | Freq: Two times a day (BID) | ORAL | Status: DC
  Administered 2022-09-19 – 2022-09-22 (×7): 100 mg via ORAL
  Filled 2022-09-19 (×7): qty 1

## 2022-09-19 MED ORDER — GUAIFENESIN-DM 100-10 MG/5ML PO SYRP: 5.0000 mL | ORAL_SOLUTION | ORAL | Status: DC | PRN

## 2022-09-19 MED ORDER — HYDRALAZINE HCL 20 MG/ML IJ SOLN
10.0000 mg | Freq: Four times a day (QID) | INTRAMUSCULAR | Status: DC | PRN
  Administered 2022-09-20: 10 mg via INTRAVENOUS
  Filled 2022-09-19: qty 1

## 2022-09-19 MED ORDER — HEPARIN SODIUM (PORCINE) 5000 UNIT/ML IJ SOLN
5000.0000 [IU] | Freq: Three times a day (TID) | INTRAMUSCULAR | Status: DC
  Administered 2022-09-19 – 2022-09-22 (×9): 5000 [IU] via SUBCUTANEOUS
  Filled 2022-09-19 (×9): qty 1

## 2022-09-19 MED ORDER — FAMOTIDINE 20 MG PO TABS
20.0000 mg | ORAL_TABLET | Freq: Every day | ORAL | Status: DC
  Administered 2022-09-19 – 2022-09-22 (×4): 20 mg via ORAL
  Filled 2022-09-19 (×4): qty 1

## 2022-09-19 MED ORDER — TECHNETIUM TO 99M ALBUMIN AGGREGATED
4.1000 | Freq: Once | INTRAVENOUS | Status: AC
  Administered 2022-09-19: 4.1 via INTRAVENOUS

## 2022-09-19 MED ORDER — STERILE WATER FOR INJECTION IV SOLN
INTRAVENOUS | Status: DC
  Filled 2022-09-19 (×2): qty 150
  Filled 2022-09-19 (×2): qty 1000
  Filled 2022-09-19: qty 150

## 2022-09-19 MED ORDER — GUAIFENESIN ER 600 MG PO TB12
600.0000 mg | ORAL_TABLET | Freq: Two times a day (BID) | ORAL | Status: DC
  Administered 2022-09-19 – 2022-09-22 (×7): 600 mg via ORAL
  Filled 2022-09-19 (×7): qty 1

## 2022-09-19 MED ORDER — ONDANSETRON HCL 4 MG/2ML IJ SOLN
4.0000 mg | Freq: Four times a day (QID) | INTRAMUSCULAR | Status: DC | PRN
  Administered 2022-09-19 – 2022-09-22 (×3): 4 mg via INTRAVENOUS
  Filled 2022-09-19 (×3): qty 2

## 2022-09-19 MED ORDER — BENZONATATE 100 MG PO CAPS
100.0000 mg | ORAL_CAPSULE | Freq: Three times a day (TID) | ORAL | Status: DC
  Administered 2022-09-19 – 2022-09-22 (×10): 100 mg via ORAL
  Filled 2022-09-19 (×10): qty 1

## 2022-09-19 NOTE — Progress Notes (Signed)
Lower extremity venous duplex completed. Please see CV Procedures for preliminary results.  Shona Simpson, RVT 09/19/22 1:53 PM

## 2022-09-19 NOTE — Plan of Care (Signed)
  Problem: Clinical Measurements: Goal: Diagnostic test results will improve Outcome: Progressing Goal: Respiratory complications will improve Outcome: Progressing   

## 2022-09-19 NOTE — Progress Notes (Signed)
[facility]                                                                              Colleen Obrien, is a 67 y.o. 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 She is followed by transplant center at [facility] and being considered for renal transplantation. She follows with Dr. Marisue Humble (nephrology) and has discussed possibly needing dialysis prior to transplant.  Chest x-ray showed moderate airspace consolidation over the left midlung and minimally in the lung bases compatible with multifocal pneumonia, hiatal hernia  BMP showed sodium 132, CO2 16, BUN 80, creatinine 6.24, creatinine was 5.61 on 09/06/2022 Leukocytosis, WBCs 13.5, hemoglobin 10.2, baseline 10.5 Afebrile, HR 78, RR 26, BP 156/94, sats 90 to 93% on room air  Assessment & Plan    Principal Problem:   Multifocal pneumonia -No hypoxia, urine strep antigen negative.  COVID-19 negative.  Procalcitonin 1.48. -Follow blood cultures, urine Legionella antigen -Continue IV Rocephin, doxycycline -Placed on Tessalon, Mucinex, Robitussin, incentive spirometry   Active Problems:   Acute renal failure superimposed on stage 5 chronic kidney disease, not on chronic dialysis (HCC), metabolic acidosis History of lupus nephritis -Baseline creatinine 5.61 on 09/06/2022, function has been gradually worsening in the last 3 months -Follows transplant center at [facility] and nephrology, Dr. Marisue Humble -Per patient has not been eating well in the last couple of days, vomiting x1 this morning -Received fluid bolus in ED.  Placed on IV fluid  hydration with bicarb drip, creatinine improving to 5.9 today, will consult nephrology     Positive D dimer -Follow VQ scan, Doppler ultrasound of lower extremities, currently on IV heparin drip    Lupus (HCC) -Continue Plaquenil    Asthma -Mild wheezing noted, continue albuterol as needed, Breo ellipta   Hypertensive urgency -BP not well-controlled, continue labetalol 300 mg twice daily, Plendil 10 mg daily -Add hydralazine IV as needed with parameters   GERD with hiatal hernia -Placed on Pepcid twice daily  Obesity Estimated body mass index is 35.48 kg/m as calculated from the following:   Height as of this encounter: 5\' 2"  (1.575 m).   Weight as of this encounter: 88 kg.  Code Status: Full code DVT Prophylaxis:   IV heparin drip  Level of Care: Level of care: Stepdown Family Communication: Updated patient's husband at the bedside Disposition Plan:      Remains inpatient appropriate: Not medically ready for discharge   Procedures:  None  Consultants:   Nephrology  Antimicrobials:   Anti-infectives (From admission, onward)    Start     Dose/Rate Route Frequency Ordered Stop   09/19/22 1400  cefTRIAXone (ROCEPHIN) 2 g in sodium chloride 0.9 % 100 mL IVPB  2 g 200 mL/hr over 30 Minutes Intravenous Every 24 hours 09/18/22 1639 09/23/22 1359   09/19/22 1000  hydroxychloroquine (PLAQUENIL) tablet 200 mg        200 mg Oral Daily 09/18/22 1648     09/19/22 0800  doxycycline (VIBRA-TABS) tablet 100 mg  Status:  Discontinued        100 mg Oral Every 12 hours 09/18/22 1639 09/19/22 0604   09/19/22 0800  doxycycline (VIBRA-TABS) tablet 100 mg        100 mg Oral Every 12 hours 09/19/22 0604 09/23/22 0759   09/18/22 1345  cefTRIAXone (ROCEPHIN) 2 g in sodium chloride 0.9 % 100 mL IVPB        2 g 200 mL/hr over 30 Minutes Intravenous  Once 09/18/22 1338 09/18/22 1529   09/18/22 1345  doxycycline (VIBRA-TABS) tablet 100 mg        100 mg Oral  Once 09/18/22 1338  09/18/22 1452          Medications  allopurinol  250 mg Oral Daily   Chlorhexidine Gluconate Cloth  6 each Topical Q0600   doxycycline  100 mg Oral Q12H   felodipine  10 mg Oral Daily   fluticasone furoate-vilanterol  1 puff Inhalation Daily   hydroxychloroquine  200 mg Oral Daily   labetalol  300 mg Oral BID   lidocaine  1 patch Transdermal Q24H   sodium chloride flush  3 mL Intravenous Q12H      Subjective:   Colleen Obrien was seen and examined today.  BP not well-controlled, mild wheezing, no fevers or chills, husband at the bedside. Patient denies dizziness, chest pain, acute shortness of breath, abdominal pain. + Vomiting x 1 this morning  Objective:   Vitals:   09/19/22 0717 09/19/22 0747 09/19/22 0800 09/19/22 1027  BP:   (!) 161/106 (!) 179/99  Pulse:   93   Resp:   18   Temp: 97.9 F (36.6 C)     TempSrc: Oral     SpO2:  93% 93%   Weight:      Height:        Intake/Output Summary (Last 24 hours) at 09/19/2022 1031 Last data filed at 09/19/2022 0945 Gross per 24 hour  Intake 1842.57 ml  Output --  Net 1842.57 ml     Wt Readings from Last 3 Encounters:  09/19/22 88 kg  05/24/22 88 kg  05/17/22 88 kg     Exam General: Alert and oriented x 3, NAD Cardiovascular: S1 S2 auscultated,  RRR Respiratory: Bibasilar rhonchi, coarse rales on the left Gastrointestinal: Soft, nontender, nondistended, + bowel sounds Ext: no pedal edema bilaterally Neuro: no new deficits Psych: Normal affect     Data Reviewed:  I have personally reviewed following labs    CBC Lab Results  Component Value Date   WBC 11.3 (H) 09/19/2022   RBC 3.22 (L) 09/19/2022   HGB 9.3 (L) 09/19/2022   HCT 29.3 (L) 09/19/2022   MCV 91.0 09/19/2022   MCH 28.9 09/19/2022   PLT 215 09/19/2022   MCHC 31.7 09/19/2022   RDW 15.7 (H) 09/19/2022   LYMPHSABS 0.3 (L) 09/18/2022   MONOABS 1.2 (H) 09/18/2022   EOSABS 0.0 09/18/2022   BASOSABS 0.0 09/18/2022     Last  metabolic panel Lab Results  Component Value Date   NA 134 (L) 09/19/2022   K 4.4 09/19/2022   CL 108 09/19/2022   CO2 16 (L) 09/19/2022   BUN 78 (H) 09/19/2022  CREATININE 5.99 (H) 09/19/2022   GLUCOSE 95 09/19/2022   GFRNONAA 7 (L) 09/19/2022   GFRAA 11 (L) 08/27/2019   CALCIUM 11.2 (H) 09/19/2022   PHOS 5.5 (H) 09/06/2022   PROT 7.5 09/18/2022   ALBUMIN 3.5 09/18/2022   BILITOT 0.4 09/18/2022   ALKPHOS 103 09/18/2022   AST 15 09/18/2022   ALT 13 09/18/2022   ANIONGAP 10 09/19/2022    CBG (last 3)  No results for input(s): "GLUCAP" in the last 72 hours.    Coagulation Profile: No results for input(s): "INR", "PROTIME" in the last 168 hours.   Radiology Studies: I have personally reviewed the imaging studies  DG Chest 2 View  Result Date: 09/18/2022 CLINICAL DATA:  Left chest pain and cough with body aches and fatigue as well as fever 3-4 days. EXAM: CHEST - 2 VIEW COMPARISON:  08/21/2019 FINDINGS: Lungs are adequately inflated demonstrate moderate airspace consolidation over the left midlung as well as minimally in the lung bases compatible multifocal pneumonia. No effusion. Findings suggesting a hiatal hernia. Cardiomediastinal silhouette and remainder the exam is unchanged. Possible old right humeral neck fracture. IMPRESSION: 1. Moderate airspace consolidation over the left midlung as well as minimally in the lung bases compatible multifocal pneumonia. 2. Hiatal hernia. Electronically Signed   By: Elberta Fortis M.D.   On: 09/18/2022 13:05       Kyesha Balla M.D. [facility] 09/19/2022, 10:31 AM  Available via Epic secure chat 7am-7pm After 7 pm, please refer to night coverage provider listed on amion.

## 2022-09-19 NOTE — Progress Notes (Signed)
ANTICOAGULATION CONSULT NOTE Pharmacy Consult for IV heparin Indication: pulmonary embolus  Allergies  Allergen Reactions   Erythromycin Nausea And Vomiting   Levofloxacin     Causes muscle pain    Sulfa Antibiotics Hives    Patient Measurements: Height: 5\' 2"  (157.5 cm) Weight: 88 kg (194 lb 0.1 oz) IBW/kg (Calculated) : 50.1 Heparin Dosing Weight: 70.2 kg  Vital Signs: Temp: 98.2 F (36.8 C) (08/19 0418) Temp Source: Oral (08/19 0418) BP: 153/77 (08/19 0300) Pulse Rate: 89 (08/19 0300)  Labs: Recent Labs    09/18/22 1231 09/18/22 1451 09/18/22 2109 09/19/22 0358  HGB 10.2*  --   --  9.3*  HCT 30.8*  --   --  29.3*  PLT 219  --   --  215  HEPARINUNFRC  --   --  0.26* 0.33  CREATININE 6.24*  --   --  5.99*  TROPONINIHS 13 14  --   --     Estimated Creatinine Clearance: 9.5 mL/min (A) (by C-G formula based on SCr of 5.99 mg/dL (H)).   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 year old female admitted on 09/18/2022 with concern for PE. No anticoagulation prior to admission. Patient to be transferred to [facility] for VQ scan. Pharmacy consulted to dose heparin.  09/19/2022:  Heparin level 0.33 on IV heparin 1350 units/hr CBC: Hg low/trending down at 9.3, pltc WNL Stage V CKD - Scr 5.99 No bleeding or infusion related concerns reported by RN VQ scan pending  Goal of Therapy:  Heparin level  0.3-0.7 units/ml Monitor platelets by anticoagulation protocol: Yes   Plan:  Continue heparin infusion at 1350 units/hr Check confirmatory heparin level at 1000  Daily heparin level, CBC, and monitoring for bleeding F/u VQ scan, plans for anticoagulation   Thank you for allowing pharmacy to participate in this patient's care.  Junita Push, PharmD, BCPS 09/19/2022,4:30 AM

## 2022-09-19 NOTE — Progress Notes (Signed)
PHARMACY NOTE:  RENAL DOSAGE ADJUSTMENT  Pepcid 20 mg po bid CrCl 9.5 ml/hr  Plan: decr to Pepcid 20 mg po qday  Herby Abraham, Pharm.D Use secure chat for questions 09/19/2022 10:54 AM

## 2022-09-19 NOTE — Consult Note (Addendum)
Renal Service Consult Note [facility]  Colleen Obrien 09/19/2022 Colleen Krabbe, MD Requesting Physician: Dr. Isidoro Donning  Reason for Consult: Renal failure HPI: The patient is a 66 y.o. year-old w/ PMH as below who presented to ED at [facility] 8/18 yesterday c/o gen fatigue, HA, joint pain and cough w/ sputum. Subjective fevers as well. Left chest pain.  Nausea, no diarrhea. Pt has hx of CKD 5, lupus nephritis, asthma, OSA, HTN. In ED pt afeb, SpO2 90s on RA, ^RR, HR wnl, and stable GP. CXR concerning for multifocal pna. Creat 6.2 (b/l 4- 5). Wbc 13k, LA wnl, dimer 1.67, trop wnl and neg COVID test.  Blood cx's were taken and pt was started on IV heparin (chest pain), given 1 L NS, norco po, IV rocephin and doxycycline. Pt was rx'd to [facility] hospital for admission. We are asked to see for renal failure.   Pt seen in room. C/o nausea, no confusion or jerking of extremities. +loss of appetite. Cough is mild. She is f/b Dr Marisue Humble at [facility].   ROS - denies CP, no joint pain, no HA, no blurry vision, no rash, no diarrhea, no nausea/ vomiting, no dysuria, no difficulty voiding   Past Medical History  Past Medical History:  Diagnosis Date   Anemia in chronic kidney disease (CKD)    Arthritis    Asthma    Blood dyscrasia    systemic lupus   Chronic kidney disease (CKD), active medical management without dialysis, stage 4 (severe) (HCC)    Dyspnea    GERD (gastroesophageal reflux disease)    Gout    Hypertension    Lupus (HCC)    Narcolepsy    Osteoporosis 05/05/2022   Pneumonia    x 1   Sleep apnea    tries to use CPAP nightly as of 04/29/22   Walker as Engineer, water dependent    when going outside home   Past Surgical History  Past Surgical History:  Procedure Laterality Date   CESAREAN SECTION     x 2   COLONOSCOPY     ganglion cyst removed Right    arm   TIBIA IM NAIL INSERTION Right 05/03/2022   Procedure: INTRAMEDULLARY (IM) NAIL TIBIAL;   Surgeon: Myrene Galas, MD;  Location: [facility] OR;  Service: Orthopedics;  Laterality: Right;   TOTAL HIP ARTHROPLASTY Left 08/26/2019   Procedure: LEFT TOTAL HIP ARTHROPLASTY ANTERIOR APPROACH;  Surgeon: Tarry Kos, MD;  Location: [facility] OR;  Service: Orthopedics;  Laterality: Left;   TUBAL LIGATION     Family History  Family History  Problem Relation Age of Onset   Hypertension Mother    Diabetes Mother    Dementia Father    Hypertension Father    Hyperlipidemia Father    Diabetes Paternal Grandmother    Alcohol abuse Paternal Grandfather    Social History  reports that she has never smoked. She has never used smokeless tobacco. She reports that she does not drink alcohol and does not use drugs. Allergies  Allergies  Allergen Reactions   Erythromycin Nausea And Vomiting   Levofloxacin     Causes muscle pain    Sulfa Antibiotics Hives   Home medications Prior to Admission medications   Medication Sig Start Date End Date Taking? Authorizing Provider  acetaminophen (TYLENOL) 500 MG tablet Take 500 mg by mouth every 6 (six) hours as needed (for pain.).   Yes [provider]  albuterol (PROVENTIL HFA;VENTOLIN HFA)  108 (90 BASE) MCG/ACT inhaler Inhale 2 puffs into the lungs every 6 (six) hours as needed for wheezing or shortness of breath.   Yes [provider]  allopurinol (ZYLOPRIM) 100 MG tablet Take 250 mg by mouth daily.   Yes [provider]  budesonide-formoterol (SYMBICORT) 80-4.5 MCG/ACT inhaler Inhale 2 puffs into the lungs 2 (two) times daily as needed (asthma).   Yes [provider]  denosumab (PROLIA) 60 MG/ML SOLN injection Inject 60 mg into the skin every 6 (six) months. Administer in upper arm, thigh, or abdomen   Yes [provider]  felodipine (PLENDIL) 10 MG 24 hr tablet Take 10 mg by mouth daily. 01/30/21  Yes [provider]  furosemide (LASIX) 40 MG tablet Take 40 mg by mouth daily as needed (fluid retention.).   Yes  [provider]  hydroxychloroquine (PLAQUENIL) 200 MG tablet Take 200 mg by mouth daily.    Yes [provider]  labetalol (NORMODYNE) 300 MG tablet Take 300 mg by mouth 2 (two) times daily. 04/11/22  Yes [provider]  Vitamin D, Ergocalciferol, (DRISDOL) 1.25 MG (50000 UNIT) CAPS capsule Take 1 capsule (50,000 Units total) by mouth every 7 (seven) days. Patient taking differently: Take 50,000 Units by mouth every 7 (seven) days. fridays 12/29/21  Yes Tarry Kos, MD  apixaban (ELIQUIS) 2.5 MG TABS tablet Take 1 tablet (2.5 mg total) by mouth 2 (two) times daily. Patient not taking: Reported on 09/18/2022 05/05/22 06/04/22  Montez Morita, PA-C     Vitals:   09/19/22 1000 09/19/22 1027 09/19/22 1100 09/19/22 1200  BP:  (!) 179/99    Pulse: 96  93   Resp: (!) 21  20   Temp:    97.8 F (36.6 C)  TempSrc:    Oral  SpO2: 91%  90%   Weight:      Height:       Exam Gen alert, no distress No rash, cyanosis or gangrene Sclera anicteric, throat clear  No jvd or bruits Chest scattered crackles L side, R CTA, no wheezing RRR no RG Abd soft ntnd no mass or ascites +bs GU defer MS no joint effusions or deformity Ext no LE or UE edema, no wounds or ulcers Neuro is alert, Ox 3 , nf, no asterixis    Home meds include - albuterol, allopurinol, apixaban, symbicort, denosumab q 6 mos, felodipine 10 every day, furosemide 40 every day prn, hydroxychloroquine, labetalol 300 bid, prns/ vits/ supps    Date   Creat  eGFR   2018   2.27  26 ml/min   2019   2.22   2020   2.46   July 2021  3.97- 4.57 11-13 ml/min   Mar- dec 2023 3.18- 4.68 10- 13 ml/min    Jan 2024  4.27  11   April - may 2024 4.00- 5.46 8- 12    Jun- July 2024 5.05- 5.38 8- 9 ml/min     09/06/22  5.61  8   09/18/22  6.24  7 ml/min   09/19/22  5.99  7 ml/min      2018 renal US --> 10 cm kidneys no hydro, +cortical thinning bilat   8/18 UA - prot 100, rare bact, 0-5 rbc/wbc/ epis   UNa 34,  UCr 105      CXR 8/18 - IMPRESSION: 1. Moderate airspace consolidation over the left midlung as well as minimally in the lung bases compatible multifocal pneumonia.      Today's -->  Na 134  K 4.4  CO2 16  BUN 78  creat 5.99                            Wbc 11K  Hb 9.3    Assessment/ Plan: AKI on CKD 5 - b/l creatinine 4.0- 5.0 from April - July 2024, eGFR 9- 12 ml/min. Creat here was 6.2 on admission in the setting of fevers, cough and suspected multifocal pna. With IVF's creat is down today to 5.9. UA unremarkable, urine lytes c/w advanced renal failure. No hypotension, BP's actually high. AKI is likely due to vol depletion related to acute illness, fevers, etc.  Pt is voiding now but nothing recorded yet, will d/w RN. No sig uremic symptoms, no need for RRT at this time. Will ^bicarb to 100 cc/hr, get renal US. Will follow.  Metabolic acidosis - w/ serum Co2 16. Bicarb gtt started, ^ to 100 cc/hr.  PNA - on IV rocephin and doxycyline, per pmd.  ^D-dimer - started on IV heparin empirically, awaiting V/Q scan.  HTN - cont home meds as you are doing Hypercalcemia - not sure cause, is on high dose vit D3. Hold for now. Check pth, vit D level and calcitriol levels.  H/o SLE - just taking hydroxychloroquine now.       Vinson Moselle  MD [facility] 09/19/2022, 12:13 PM  Recent Labs  Lab 09/18/22 1231 09/19/22 0358  HGB 10.2* 9.3*  ALBUMIN 3.5  --   CALCIUM 12.6* 11.2*  CREATININE 6.24* 5.99*  K 4.7 4.4   Inpatient medications:  allopurinol  250 mg Oral Daily   benzonatate  100 mg Oral TID   Chlorhexidine Gluconate Cloth  6 each Topical Q0600   doxycycline  100 mg Oral Q12H   famotidine  20 mg Oral Daily   felodipine  10 mg Oral Daily   fluticasone furoate-vilanterol  1 puff Inhalation Daily   guaiFENesin  600 mg Oral BID   hydroxychloroquine  200 mg Oral Daily   labetalol  300 mg Oral BID   lidocaine  1 patch Transdermal Q24H   sodium chloride flush  3 mL Intravenous Q12H    cefTRIAXone (ROCEPHIN)  IV      heparin 1,350 Units/hr (09/19/22 0756)   sodium bicarbonate 150 mEq in sterile water 1,150 mL infusion 75 mL/hr at 09/19/22 0904   acetaminophen **OR** acetaminophen, albuterol, guaiFENesin-dextromethorphan, hydrALAZINE, HYDROmorphone (DILAUDID) injection, ondansetron (ZOFRAN) IV, oxyCODONE

## 2022-09-19 NOTE — Progress Notes (Signed)
ANTICOAGULATION CONSULT NOTE Pharmacy Consult for IV heparin Indication: pulmonary embolus  Allergies  Allergen Reactions   Erythromycin Nausea And Vomiting   Levofloxacin     Causes muscle pain    Sulfa Antibiotics Hives    Patient Measurements: Height: 5\' 2"  (157.5 cm) Weight: 88 kg (194 lb 0.1 oz) IBW/kg (Calculated) : 50.1 Heparin Dosing Weight: 70.2 kg  Vital Signs: Temp: 97.9 F (36.6 C) (08/19 0717) Temp Source: Oral (08/19 0717) BP: 179/99 (08/19 1027) Pulse Rate: 93 (08/19 0800)  Labs: Recent Labs    09/18/22 1231 09/18/22 1451 09/18/22 2109 09/19/22 0358 09/19/22 1035  HGB 10.2*  --   --  9.3*  --   HCT 30.8*  --   --  29.3*  --   PLT 219  --   --  215  --   HEPARINUNFRC  --   --  0.26* 0.33 0.37  CREATININE 6.24*  --   --  5.99*  --   TROPONINIHS 13 14  --   --   --     Estimated Creatinine Clearance: 9.5 mL/min (A) (by C-G formula based on SCr of 5.99 mg/dL (H)).   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 year old female admitted on 09/18/2022 with concern for PE. No anticoagulation prior to admission. Patient to be transferred to [facility] for VQ scan. Pharmacy consulted to dose heparin.  09/19/2022:  Heparin level continues to be therapeutic on current IV heparin rate of 1350 units/hr CBC: Hg low/trending down at 9.3, pltc WNL Stage V CKD - Scr 5.99 No bleeding or infusion  related concerns reported by RN VQ scan pending  Goal of Therapy:  Heparin level 0.3-0.7 units/ml Monitor platelets by anticoagulation protocol: Yes   Plan:  Continue heparin infusion at 1350 units/hr Daily heparin level, CBC, and monitoring for bleeding F/u VQ scan, plans for anticoagulation    Thank you for allowing pharmacy to participate in this patient's care.   Hessie Knows, PharmD, BCPS Secure Chat if ?s 09/19/2022 11:18 AM

## 2022-09-20 ENCOUNTER — Telehealth: Payer: Self-pay | Admitting: Rehabilitative and Restorative Service Providers"

## 2022-09-20 ENCOUNTER — Encounter: Payer: Medicare PPO | Admitting: Rehabilitative and Restorative Service Providers"

## 2022-09-20 DIAGNOSIS — J189 Pneumonia, unspecified organism: Secondary | ICD-10-CM | POA: Diagnosis not present

## 2022-09-20 DIAGNOSIS — R042 Hemoptysis: Secondary | ICD-10-CM | POA: Diagnosis not present

## 2022-09-20 DIAGNOSIS — R079 Chest pain, unspecified: Secondary | ICD-10-CM | POA: Diagnosis not present

## 2022-09-20 DIAGNOSIS — R0602 Shortness of breath: Secondary | ICD-10-CM | POA: Diagnosis not present

## 2022-09-20 LAB — CBC
HCT: 29.4 % — ABNORMAL LOW (ref 36.0–46.0)
Hemoglobin: 9.6 g/dL — ABNORMAL LOW (ref 12.0–15.0)
MCH: 29.1 pg (ref 26.0–34.0)
MCHC: 32.7 g/dL (ref 30.0–36.0)
MCV: 89.1 fL (ref 80.0–100.0)
Platelets: 229 10*3/uL (ref 150–400)
RBC: 3.3 MIL/uL — ABNORMAL LOW (ref 3.87–5.11)
RDW: 15.7 % — ABNORMAL HIGH (ref 11.5–15.5)
WBC: 9.6 10*3/uL (ref 4.0–10.5)
nRBC: 0 % (ref 0.0–0.2)

## 2022-09-20 LAB — BASIC METABOLIC PANEL
Anion gap: 12 (ref 5–15)
BUN: 86 mg/dL — ABNORMAL HIGH (ref 8–23)
CO2: 21 mmol/L — ABNORMAL LOW (ref 22–32)
Calcium: 10.9 mg/dL — ABNORMAL HIGH (ref 8.9–10.3)
Chloride: 102 mmol/L (ref 98–111)
Creatinine, Ser: 6.03 mg/dL — ABNORMAL HIGH (ref 0.44–1.00)
GFR, Estimated: 7 mL/min — ABNORMAL LOW (ref >60)
Glucose, Bld: 146 mg/dL — ABNORMAL HIGH (ref 70–99)
Potassium: 4.1 mmol/L (ref 3.5–5.1)
Sodium: 135 mmol/L (ref 135–145)

## 2022-09-20 LAB — UREA NITROGEN, URINE: Urea Nitrogen, Ur: 526 mg/dL

## 2022-09-20 LAB — PARATHYROID HORMONE, INTACT (NO CA): PTH: 473 pg/mL — ABNORMAL HIGH (ref 15–65)

## 2022-09-20 LAB — CALCITRIOL (1,25 DI-OH VIT D): Vit D, 1,25-Dihydroxy: 57.3 pg/mL (ref 24.8–81.5)

## 2022-09-20 MED ORDER — BUDESONIDE 0.25 MG/2ML IN SUSP
0.2500 mg | Freq: Two times a day (BID) | RESPIRATORY_TRACT | Status: DC
  Administered 2022-09-20 – 2022-09-22 (×5): 0.25 mg via RESPIRATORY_TRACT
  Filled 2022-09-20 (×5): qty 2

## 2022-09-20 MED ORDER — IPRATROPIUM-ALBUTEROL 0.5-2.5 (3) MG/3ML IN SOLN: 3.0000 mL | Freq: Four times a day (QID) | RESPIRATORY_TRACT | Status: DC

## 2022-09-20 MED ORDER — SODIUM CHLORIDE 0.9 % IV BOLUS
1500.0000 mL | Freq: Once | INTRAVENOUS | Status: AC
  Administered 2022-09-20: 1500 mL via INTRAVENOUS

## 2022-09-20 MED ORDER — IPRATROPIUM-ALBUTEROL 0.5-2.5 (3) MG/3ML IN SOLN: 3.0000 mL | RESPIRATORY_TRACT | Status: DC | PRN

## 2022-09-20 MED ORDER — ALBUTEROL SULFATE (2.5 MG/3ML) 0.083% IN NEBU: 2.5000 mg | INHALATION_SOLUTION | RESPIRATORY_TRACT | Status: DC | PRN

## 2022-09-20 MED ORDER — ARFORMOTEROL TARTRATE 15 MCG/2ML IN NEBU
15.0000 ug | INHALATION_SOLUTION | Freq: Two times a day (BID) | RESPIRATORY_TRACT | Status: DC
  Administered 2022-09-20 – 2022-09-22 (×5): 15 ug via RESPIRATORY_TRACT
  Filled 2022-09-20 (×5): qty 2

## 2022-09-20 NOTE — Progress Notes (Signed)
[facility]                                                                              Colleen Obrien, is a 67 y.o. 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 She is followed by transplant center at [facility] and being considered for renal transplantation. She follows with Dr. Marisue Humble (nephrology) and has discussed possibly needing dialysis prior to transplant.  Chest x-ray showed moderate airspace consolidation over the left midlung and minimally in the lung bases compatible with multifocal pneumonia, hiatal hernia  BMP showed sodium 132, CO2 16, BUN 80, creatinine 6.24, creatinine was 5.61 on 09/06/2022 Leukocytosis, WBCs 13.5, hemoglobin 10.2, baseline 10.5 Afebrile, HR 78, RR 26, BP 156/94, sats 90 to 93% on room air  Assessment & Plan    Principal Problem:   Multifocal pneumonia -No hypoxia, urine strep antigen negative.  COVID-19 negative.  Procalcitonin 1.48. -Blood cultures negative till date, urine Legionella antigen pending -Continue IV Rocephin, doxycycline -Noted some wheezing today, placed on DuoNebs, Pulmicort, Brovana, incentive spirometry O2 sats 94% on room air, was placed on 3 L O2 last night -Per patient, she has history of OSA, has CPAP at home but noncompliant.  Will try CPAP inpatient  Active Problems:   Acute renal failure superimposed on stage 5 chronic kidney disease, not on chronic dialysis (HCC), metabolic acidosis History of lupus nephritis -Baseline creatinine 5.61 on 09/06/2022, function has been gradually worsening in the last 3  months -Follows transplant center at [facility] and nephrology, Dr. Marisue Humble -Presented with creatinine of 6.24, improved to 5.9 on 8/19, trended up to 6.0 again today -Renal ultrasound showed medical renal disease, no obstruction or hydronephrosis.  -nephrology following, on IV fluids     Positive D dimer -Likely due to #1 -V/Q scan showed single matched lower lobe segmental defect corresponding to the focal opacity on the chest x-ray.  Low probability of PE -Venous Dopplers negative for DVT in LE -Discontinued IV heparin drip, continue prophylactic dose    Lupus (HCC) -Continue Plaquenil    Asthma -Mild wheezing noted, placed on Pulmicort, Brovana, DuoNebs   Hypertensive urgency -BP improving, continue labetalol 300 mg twice daily, Plendil 10 mg daily -Add hydralazine IV as needed with parameters   GERD with hiatal hernia -Placed on Pepcid twice daily  Obesity Estimated body mass index is 34.03 kg/m as calculated from the following:   Height as of this encounter: 5\' 2"  (1.575 m).   Weight as of this encounter: 84.4 kg.  Code Status: Full code DVT Prophylaxis:  heparin injection 5,000 Units Start: 09/19/22 2200 IV heparin drip  Level of Care: Level of care: Progressive Family Communication: Updated patient's sister at the bedside Disposition Plan:  Remains inpatient appropriate:  transfer to the floor today.     Procedures:  None  Consultants:   Nephrology  Antimicrobials:   Anti-infectives (From admission, onward)    Start     Dose/Rate Route Frequency Ordered Stop   09/19/22 1400  cefTRIAXone (ROCEPHIN) 2 g in sodium chloride 0.9 % 100 mL IVPB        2 g 200 mL/hr over 30 Minutes Intravenous Every 24 hours 09/18/22 1639 09/23/22 1359   09/19/22 1000  hydroxychloroquine (PLAQUENIL) tablet 200 mg        200 mg Oral Daily 09/18/22 1648     09/19/22 0800  doxycycline (VIBRA-TABS) tablet 100 mg  Status:  Discontinued        100 mg Oral Every 12 hours 09/18/22  1639 09/19/22 0604   09/19/22 0800  doxycycline (VIBRA-TABS) tablet 100 mg        100 mg Oral Every 12 hours 09/19/22 0604 09/23/22 0759   09/18/22 1345  cefTRIAXone (ROCEPHIN) 2 g in sodium chloride 0.9 % 100 mL IVPB        2 g 200 mL/hr over 30 Minutes Intravenous  Once 09/18/22 1338 09/18/22 1529   09/18/22 1345  doxycycline (VIBRA-TABS) tablet 100 mg        100 mg Oral  Once 09/18/22 1338 09/18/22 1452          Medications  allopurinol  250 mg Oral Daily   arformoterol  15 mcg Nebulization BID   benzonatate  100 mg Oral TID   budesonide (PULMICORT) nebulizer solution  0.25 mg Nebulization BID   Chlorhexidine Gluconate Cloth  6 each Topical Q0600   doxycycline  100 mg Oral Q12H   famotidine  20 mg Oral Daily   felodipine  10 mg Oral Daily   guaiFENesin  600 mg Oral BID   heparin injection (subcutaneous)  5,000 Units Subcutaneous Q8H   hydroxychloroquine  200 mg Oral Daily   labetalol  300 mg Oral BID   lidocaine  1 patch Transdermal Q24H   sodium chloride flush  3 mL Intravenous Q12H      Subjective:   Colleen Obrien was seen and examined today.  Overnight was placed on 3 L O2 via [place], mild wheezing noted.  No acute fever chills, worsening cough.  No dizziness or chest pain.  Sister at the bedside.   Objective:   Vitals:   09/20/22 0930 09/20/22 1000 09/20/22 1140 09/20/22 1401  BP: (!) 170/95 (!) 165/93  119/75  Pulse: 90 88  66  Resp:  (!) 25  (!) 24  Temp:   98.1 F (36.7 C) 97.6 F (36.4 C)  TempSrc:   Oral Oral  SpO2:  91%  94%  Weight:      Height:        Intake/Output Summary (Last 24 hours) at 09/20/2022 1525 Last data filed at 09/20/2022 0000 Gross per 24 hour  Intake 1576.81 ml  Output --  Net 1576.81 ml     Wt Readings from Last 3 Encounters:  09/20/22 84.4 kg  05/24/22 88 kg  05/17/22 88 kg   Physical Exam General: Alert and oriented x 3, NAD Cardiovascular: S1 S2 clear, RRR.  Respiratory: Bibasilar rhonchi with  wheezing Gastrointestinal: Soft, nontender, nondistended, NBS Ext: no pedal edema bilaterally Neuro: no new deficits Psych: Normal affect   Data Reviewed:  I have personally reviewed following labs    CBC Lab Results  Component Value Date   WBC 9.6 09/20/2022  RBC 3.30 (L) 09/20/2022   HGB 9.6 (L) 09/20/2022   HCT 29.4 (L) 09/20/2022   MCV 89.1 09/20/2022   MCH 29.1 09/20/2022   PLT 229 09/20/2022   MCHC 32.7 09/20/2022   RDW 15.7 (H) 09/20/2022   LYMPHSABS 0.3 (L) 09/18/2022   MONOABS 1.2 (H) 09/18/2022   EOSABS 0.0 09/18/2022   BASOSABS 0.0 09/18/2022     Last metabolic panel Lab Results  Component Value Date   NA 135 09/20/2022   K 4.1 09/20/2022   CL 102 09/20/2022   CO2 21 (L) 09/20/2022   BUN 86 (H) 09/20/2022   CREATININE 6.03 (H) 09/20/2022   GLUCOSE 146 (H) 09/20/2022   GFRNONAA 7 (L) 09/20/2022   GFRAA 11 (L) 08/27/2019   CALCIUM 10.9 (H) 09/20/2022   PHOS 5.5 (H) 09/06/2022   PROT 7.5 09/18/2022   ALBUMIN 3.5 09/18/2022   BILITOT 0.4 09/18/2022   ALKPHOS 103 09/18/2022   AST 15 09/18/2022   ALT 13 09/18/2022   ANIONGAP 12 09/20/2022    CBG (last 3)  No results for input(s): "GLUCAP" in the last 72 hours.    Coagulation Profile: No results for input(s): "INR", "PROTIME" in the last 168 hours.   Radiology Studies: I have personally reviewed the imaging studies  US RENAL  Result Date: 09/19/2022 CLINICAL DATA:  Acute renal failure, chronic kidney disease. EXAM: RENAL / URINARY TRACT ULTRASOUND COMPLETE COMPARISON:  Renal ultrasound 06/13/2016. FINDINGS: Right Kidney: Renal measurements: 10.0 x 4.4 x 5.0 cm = volume: 114 mL. Echogenicity is increased. There is no hydronephrosis. There is 1.7 x 1.6 x 1.4 cm cyst in the lower pole. Left Kidney: Renal measurements: 10.9 x 5.0 x 5.0 cm = volume: 140 mL. Echogenicity is increased. There is no hydronephrosis. There is a 1.5 x 1.3 x 1.5 cm cyst in the upper pole. Bladder: Appears normal for degree of  bladder distention. Other: None. IMPRESSION: 1. Increased echogenicity in the kidneys suggests medical renal disease. There is no hydronephrosis. 2. Bilateral renal cysts. Electronically Signed   By: Darliss Cheney M.D.   On: 09/19/2022 22:54   VAS [name] LOWER EXTREMITY VENOUS (DVT)  Result Date: 09/19/2022  Lower Venous DVT Study Patient Name:  ALYVIAH MARTZ [facility]  Date of Exam:   09/19/2022 Medical Rec #: [id number]                  Accession #:    [id number] Date of Birth: [birth year]                  Patient Gender: F Patient Age:   89 years Exam Location:  [facility] Procedure:      VAS [name] LOWER EXTREMITY VENOUS (DVT) Referring Phys: Mckenzy Salazar --------------------------------------------------------------------------------  Indications: Edema.  Risk Factors: Limited mobility obesity. Anticoagulation: Heparin. Comparison Study: No significant changes seen since prior exam 05/19/22 Performing Technologist: Shona Simpson  Examination Guidelines: A complete evaluation includes B-mode imaging, spectral Doppler, color Doppler, and power Doppler as needed of all accessible portions of each vessel. Bilateral testing is considered an integral part of a complete examination. Limited examinations for reoccurring indications may be performed as noted. The reflux portion of the exam is performed with the patient in reverse Trendelenburg.  +---------+---------------+---------+-----------+----------+--------------+ RIGHT    CompressibilityPhasicitySpontaneityPropertiesThrombus Aging +---------+---------------+---------+-----------+----------+--------------+ CFV      Full           Yes      Yes                                 +---------+---------------+---------+-----------+----------+--------------+  SFJ      Full                                                        +---------+---------------+---------+-----------+----------+--------------+ FV Prox  Full                                                         +---------+---------------+---------+-----------+----------+--------------+ FV Mid   Full                                                        +---------+---------------+---------+-----------+----------+--------------+ FV DistalFull                                                        +---------+---------------+---------+-----------+----------+--------------+ PFV      Full                                                        +---------+---------------+---------+-----------+----------+--------------+ POP      Full           Yes      Yes                                 +---------+---------------+---------+-----------+----------+--------------+ PTV      Full                                                        +---------+---------------+---------+-----------+----------+--------------+ PERO     Full                                                        +---------+---------------+---------+-----------+----------+--------------+   +---------+---------------+---------+-----------+----------+--------------+ LEFT     CompressibilityPhasicitySpontaneityPropertiesThrombus Aging +---------+---------------+---------+-----------+----------+--------------+ CFV      Full           Yes      Yes                                 +---------+---------------+---------+-----------+----------+--------------+ SFJ      Full                                                        +---------+---------------+---------+-----------+----------+--------------+  FV Prox  Full                                                        +---------+---------------+---------+-----------+----------+--------------+ FV Mid   Full                                                        +---------+---------------+---------+-----------+----------+--------------+ FV DistalFull                                                         +---------+---------------+---------+-----------+----------+--------------+ PFV      Full                                                        +---------+---------------+---------+-----------+----------+--------------+ POP      Full           Yes      Yes                                 +---------+---------------+---------+-----------+----------+--------------+ PTV      Full                                                        +---------+---------------+---------+-----------+----------+--------------+ PERO     Full                                                        +---------+---------------+---------+-----------+----------+--------------+     Summary: BILATERAL: - No evidence of deep vein thrombosis seen in the lower extremities, bilaterally. -No evidence of popliteal cyst, bilaterally.   *See table(s) above for measurements and observations. Electronically signed by Sherald Hess MD on 09/19/2022 at 6:23:03 PM.    Final    NM Pulmonary Perfusion  Result Date: 09/19/2022 CLINICAL DATA:  Low to intermediate probability pulmonary embolism, positive D-dimer, asthma, chest pain EXAM: NUCLEAR MEDICINE PERFUSION LUNG SCAN TECHNIQUE: Perfusion images were obtained in multiple projections after intravenous injection of radiopharmaceutical. Ventilation scans intentionally deferred if perfusion scan and chest x-ray adequate for interpretation during COVID 19 epidemic. RADIOPHARMACEUTICALS:  4.1 mCi Tc-31m MAA IV COMPARISON:  None available. Findings are correlated with chest radiograph of 09/18/2022 FINDINGS: There is a wedge-shaped perfusion defect involving the anteromedial segment of the left lower lobe corresponding to the focal opacity seen on accompanying chest radiograph. Otherwise normal distribution of radiotracer. IMPRESSION: Single matched lower lobe segmental defect compatible with a low probability for pulmonary embolism. Electronically  Signed   By: Helyn Numbers M.D.    On: 09/19/2022 14:54       Ilyanna Baillargeon M.D. [facility] 09/20/2022, 3:25 PM  Available via Epic secure chat 7am-7pm After 7 pm, please refer to night coverage provider listed on amion.

## 2022-09-20 NOTE — Progress Notes (Signed)
[facility] Progress Note  Subjective: seen in room, eating solid foods. Nausea is better.   Vitals:   09/20/22 0837 09/20/22 0930 09/20/22 1000 09/20/22 1140  BP:  (!) 170/95 (!) 165/93   Pulse:  90 88   Resp:   (!) 25   Temp:    98.1 F (36.7 C)  TempSrc:    Oral  SpO2: 92%  91%   Weight:      Height:        Exam: Gen alert, no distress No jvd or bruits Chest CTA bilat RRR no RG Abd soft ntnd no mass or ascites +bs Ext no LE or UE edema Neuro is alert, Ox 3 , nf, no asterixis      Home meds include - albuterol, allopurinol, apixaban, symbicort, denosumab q 6 mos, felodipine 10 every day, furosemide 40 every day prn, hydroxychloroquine, labetalol 300 bid, prns/ vits/ supps     Date                           Creat               eGFR   2018                          2.27                 26 ml/min   2019                          2.22   2020                          2.46   July 2021                   3.97- 4.57        11-13 ml/min   Mar- dec 2023           3.18- 4.68        10- 13 ml/min     Jan 2024                   4.27                 11   April - may 2024        4.00- 5.46        8- 12       Jun- July 2024          5.05- 5.38        8- 9 ml/min                     09/06/22                        5.61                 8   09/18/22                      6.24                 7 ml/min   09/19/22                      5.99  7 ml/min        2018 renal US --> 10 cm kidneys no hydro, +cortical thinning bilat   8/18 UA - prot 100, rare bact, 0-5 rbc/wbc/ epis   UNa 34,  UCr 105     CXR 8/18 - IMPRESSION: 1. Moderate airspace consolidation over the left midlung as well as minimally in the lung bases compatible multifocal pneumonia.    Assessment/ Plan: AKI on CKD 5 - b/l creatinine 4.0- 5.0 from April - July 2024, eGFR 9- 12 ml/min. Creat here was 6.2 on admission in the setting of fevers, cough and suspected multifocal pna. With IVF's creat is down  today to 5.9. UA unremarkable, urine lytes c/w advanced renal failure. No hypotension, BP's actually high. AKI is likely due to vol depletion +/- home lasix usage. Will bolus 1.5 L and cont IVF"s at 100 cc/hr. Will follow.  Metabolic acidosis - improving, cont bicarb at 100 cc/hr.  PNA - on IV rocephin and doxycyline, per pmd.  ^D-dimer - started on IV heparin empirically, awaiting V/Q scan.  HTN - getting home meds here Hypercalcemia - improving, not sure cause. Vit D3 level is wnl (60s), calcitriol and pth pending.  H/o SLE - just taking hydroxychloroquine now.            Vinson Moselle MD  [facility] 09/20/2022, 1:00 PM  Recent Labs  Lab 09/18/22 1231 09/19/22 0358 09/20/22 0245  HGB 10.2* 9.3* 9.6*  ALBUMIN 3.5  --   --   CALCIUM 12.6* 11.2* 10.9*  CREATININE 6.24* 5.99* 6.03*  K 4.7 4.4 4.1   No results for input(s): "IRON", "TIBC", "FERRITIN" in the last 168 hours. Inpatient medications:  allopurinol  250 mg Oral Daily   arformoterol  15 mcg Nebulization BID   benzonatate  100 mg Oral TID   budesonide (PULMICORT) nebulizer solution  0.25 mg Nebulization BID   Chlorhexidine Gluconate Cloth  6 each Topical Q0600   doxycycline  100 mg Oral Q12H   famotidine  20 mg Oral Daily   felodipine  10 mg Oral Daily   guaiFENesin  600 mg Oral BID   heparin injection (subcutaneous)  5,000 Units Subcutaneous Q8H   hydroxychloroquine  200 mg Oral Daily   labetalol  300 mg Oral BID   lidocaine  1 patch Transdermal Q24H   sodium chloride flush  3 mL Intravenous Q12H    cefTRIAXone (ROCEPHIN)  IV Stopped (09/19/22 1523)   sodium bicarbonate 150 mEq in sterile water 1,150 mL infusion 100 mL/hr at 09/20/22 1037   acetaminophen **OR** acetaminophen, albuterol, guaiFENesin-dextromethorphan, hydrALAZINE, HYDROmorphone (DILAUDID) injection, ipratropium-albuterol, ondansetron (ZOFRAN) IV, oxyCODONE

## 2022-09-20 NOTE — Telephone Encounter (Signed)
Left a message checking on her based on her ED visit Sunday and missed appointment today.  Remained her of her appointment tomorrow with Kathlene November and left [phone] number to call to cancel or reschedule if needed.

## 2022-09-21 ENCOUNTER — Encounter (HOSPITAL_COMMUNITY): Payer: Self-pay | Admitting: Family Medicine

## 2022-09-21 ENCOUNTER — Inpatient Hospital Stay (HOSPITAL_COMMUNITY): Payer: Medicare PPO

## 2022-09-21 ENCOUNTER — Encounter: Payer: Medicare PPO | Admitting: Rehabilitative and Restorative Service Providers"

## 2022-09-21 DIAGNOSIS — J189 Pneumonia, unspecified organism: Secondary | ICD-10-CM | POA: Diagnosis not present

## 2022-09-21 LAB — CBC
HCT: 29.3 % — ABNORMAL LOW (ref 36.0–46.0)
Hemoglobin: 9.3 g/dL — ABNORMAL LOW (ref 12.0–15.0)
MCH: 28.5 pg (ref 26.0–34.0)
MCHC: 31.7 g/dL (ref 30.0–36.0)
MCV: 89.9 fL (ref 80.0–100.0)
Platelets: 256 10*3/uL (ref 150–400)
RBC: 3.26 MIL/uL — ABNORMAL LOW (ref 3.87–5.11)
RDW: 15.7 % — ABNORMAL HIGH (ref 11.5–15.5)
WBC: 8.6 10*3/uL (ref 4.0–10.5)
nRBC: 0 % (ref 0.0–0.2)

## 2022-09-21 LAB — BASIC METABOLIC PANEL
Anion gap: 11 (ref 5–15)
BUN: 78 mg/dL — ABNORMAL HIGH (ref 8–23)
CO2: 24 mmol/L (ref 22–32)
Calcium: 11.7 mg/dL — ABNORMAL HIGH (ref 8.9–10.3)
Chloride: 102 mmol/L (ref 98–111)
Creatinine, Ser: 5.5 mg/dL — ABNORMAL HIGH (ref 0.44–1.00)
GFR, Estimated: 8 mL/min — ABNORMAL LOW (ref >60)
Glucose, Bld: 106 mg/dL — ABNORMAL HIGH (ref 70–99)
Potassium: 4.2 mmol/L (ref 3.5–5.1)
Sodium: 137 mmol/L (ref 135–145)

## 2022-09-21 MED ORDER — METHOCARBAMOL 500 MG PO TABS: 250.0000 mg | ORAL_TABLET | Freq: Three times a day (TID) | ORAL | Status: DC | PRN

## 2022-09-21 NOTE — TOC CM/SW Note (Signed)
Transition of Care [facility]) - Inpatient Brief Assessment   Patient Details  Name: Colleen Obrien MRN: [id number] Date of Birth: Apr 19, 1955  Transition of Care [facility]) CM/SW Contact:    Larrie Kass, LCSW Phone Number: 09/21/2022, 9:25 AM   Clinical Narrative: Transition of Care Department [facility]) has reviewed patient and no TOC needs have been identified at this time. We will continue to monitor patient advancement through interdisciplinary progression rounds. If new patient transition needs arise, please place a TOC consult.   Transition of Care Asessment: Insurance and Status: Insurance coverage has been reviewed Patient has primary care physician: Yes Home environment has been reviewed: yes, live with  husband Prior level of function:: mod independent Prior/Current Home Services: No current home services Social Determinants of Health Reivew: SDOH reviewed no interventions necessary Readmission risk has been reviewed: Yes Transition of care needs: no transition of care needs at this time

## 2022-09-21 NOTE — Progress Notes (Signed)
Mobility Specialist - Progress Note   09/21/22 1416  Mobility  Activity Ambulated with assistance in hallway  Level of Assistance Standby assist, set-up cues, supervision of patient - no hands on  Assistive Device Other (Comment) (IV Pole)  Distance Ambulated (ft) 200 ft  Range of Motion/Exercises Active  Activity Response Tolerated well  Mobility Referral Yes  $Mobility charge 1 Mobility  Mobility Specialist Start Time (ACUTE ONLY) 1400  Mobility Specialist Stop Time (ACUTE ONLY) 1416  Mobility Specialist Time Calculation (min) (ACUTE ONLY) 16 min   Pt was found sitting EOB and agreeable to ambulate. Got fatigued with session and stated feeling winded. At EOS returned to sit EOB with all needs met. Call bell in reach and sister in room.  Billey Chang Mobility Specialist

## 2022-09-21 NOTE — Progress Notes (Signed)
   09/21/22 0004  BiPAP/CPAP/SIPAP  BiPAP/CPAP/SIPAP Pt Type Adult  BiPAP/CPAP/SIPAP DREAMSTATIOND  Mask Type Nasal mask  Mask Size Small  FiO2 (%) 21 %  Patient Home Equipment No  Auto Titrate Yes (5-20)  Nasal massage performed No (comment)

## 2022-09-21 NOTE — Progress Notes (Signed)
Pt placed on CPAP auto titrate for night rest.

## 2022-09-21 NOTE — Plan of Care (Signed)
  Problem: Pain Managment: Goal: General experience of comfort will improve Outcome: Progressing   Problem: Safety: Goal: Ability to remain free from injury will improve Outcome: Progressing   Problem: Respiratory: Goal: Ability to maintain adequate ventilation will improve Outcome: Progressing   Problem: Respiratory: Goal: Ability to maintain a clear airway will improve Outcome: Progressing

## 2022-09-21 NOTE — Progress Notes (Signed)
[facility] Progress Note  Subjective: seen in room, eating solid foods. Nausea is better.   Vitals:   09/21/22 0500 09/21/22 0533 09/21/22 0841 09/21/22 1253  BP:  (!) 160/91  121/79  Pulse:  82  66  Resp:  18  18  Temp:  98.2 F (36.8 C)  97.9 F (36.6 C)  TempSrc:  Oral  Oral  SpO2:  93% 93% 95%  Weight: 80.4 kg     Height:        Exam: Gen alert, no distress No jvd or bruits Chest CTA bilat RRR no RG Abd soft ntnd no mass or ascites +bs Ext no LE or UE edema Neuro is alert, Ox 3 , nf, no asterixis      Home meds include - albuterol, allopurinol, apixaban, symbicort, denosumab q 6 mos, felodipine 10 every day, furosemide 40 every day prn, hydroxychloroquine, labetalol 300 bid, prns/ vits/ supps     Date                           Creat               eGFR   2018                          2.27                 26 ml/min   2019                          2.22   2020                          2.46   July 2021                   3.97- 4.57        11-13 ml/min   Mar- dec 2023           3.18- 4.68        10- 13 ml/min     Jan 2024                   4.27                 11   April - may 2024        4.00- 5.46        8- 12       Jun- July 2024          5.05- 5.38        8- 9 ml/min                     09/06/22                        5.61                 8   09/18/22                      6.24                 7 ml/min   09/19/22                      5.99  7 ml/min        2018 renal US --> 10 cm kidneys no hydro, +cortical thinning bilat   8/18 UA - prot 100, rare bact, 0-5 rbc/wbc/ epis   UNa 34,  UCr 105     CXR 8/18 - IMPRESSION: 1. Moderate airspace consolidation over the left midlung as well as minimally in the lung bases compatible multifocal pneumonia.    Assessment/ Plan: AKI on CKD 5 - b/l creatinine 4.0- 5.0 from April - July 2024, eGFR 9- 12 ml/min. Creat here was 6.2 on admission in the setting of fevers, cough and suspected multifocal pna. UA  unremarkable, urine lytes c/w advanced renal failure. No hypotension, BP's high. AKI is likely due to vol depletion + home lasix usage. Bolused 1.5 L yesterday 8/20 and IVF"s continued at 100 cc/hr. Creat down to 5.5 today. Nausea comes and goes, may be early sign of uremia. Will dc IVF"s and f/u labs in am. Have d/w her primary nephrologist Dr Marisue Humble, they will f/u closely with him if/when dc'd. Will follow.  Metabolic acidosis - improving. DC'd IVF's.  PNA - on IV rocephin and doxycyline, per pmd.  ^D-dimer - started on IV heparin empirically, awaiting V/Q scan.  HTN - getting home meds here Hypercalcemia - Vit D3 level is wnl (60s), calcitriol and pth pending. Per Dr Marisue Humble this problem is not new and they are treating it in the outpt setting w/ denosumab injections.  H/o SLE - taking hydroxychloroquine            Vinson Moselle MD  [facility] 09/21/2022, 1:55 PM  Recent Labs  Lab 09/18/22 1231 09/19/22 0358 09/20/22 0245 09/21/22 0433  HGB 10.2*   < > 9.6* 9.3*  ALBUMIN 3.5  --   --   --   CALCIUM 12.6*   < > 10.9* 11.7*  CREATININE 6.24*   < > 6.03* 5.50*  K 4.7   < > 4.1 4.2   < > = values in this interval not displayed.   No results for input(s): "IRON", "TIBC", "FERRITIN" in the last 168 hours. Inpatient medications:  allopurinol  250 mg Oral Daily   arformoterol  15 mcg Nebulization BID   benzonatate  100 mg Oral TID   budesonide (PULMICORT) nebulizer solution  0.25 mg Nebulization BID   Chlorhexidine Gluconate Cloth  6 each Topical Q0600   doxycycline  100 mg Oral Q12H   famotidine  20 mg Oral Daily   felodipine  10 mg Oral Daily   guaiFENesin  600 mg Oral BID   heparin injection (subcutaneous)  5,000 Units Subcutaneous Q8H   hydroxychloroquine  200 mg Oral Daily   labetalol  300 mg Oral BID   lidocaine  1 patch Transdermal Q24H   sodium chloride flush  3 mL Intravenous Q12H    cefTRIAXone (ROCEPHIN)  IV 2 g (09/21/22 1340)   sodium bicarbonate 150 mEq in sterile  water 1,150 mL infusion 100 mL/hr at 09/21/22 0606   acetaminophen **OR** acetaminophen, albuterol, guaiFENesin-dextromethorphan, hydrALAZINE, HYDROmorphone (DILAUDID) injection, ipratropium-albuterol, ondansetron (ZOFRAN) IV, oxyCODONE

## 2022-09-21 NOTE — Hospital Course (Signed)
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  Currently being treated for multifocal pneumonia as well as AKI on CKD.  Assessment and Plan: Multifocal pneumonia OSA No hypoxia, urine strep antigen negative.  COVID-19 negative.  Procalcitonin 1.48. Blood cultures negative till date, urine Legionella antigen pending Treated with antibiotics. Examination improving.  Currently on room air. -Per patient, she has history of OSA, has CPAP at home but noncompliant.  Will try CPAP inpatient   Acute renal failure superimposed on stage 5 chronic kidney disease, not on chronic dialysis (HCC), metabolic acidosis History of lupus nephritis Hypercalcemia Baseline creatinine 5.61 on 09/06/2022, function has been gradually worsening in the last 3 months Follows transplant center at [facility] and nephrology, Dr. Marisue Humble -Renal ultrasound showed medical renal disease, no obstruction or hydronephrosis.  -nephrology following, Renal function improving with IV fluid.  Will monitor without fluid and follow response.  LLQ pain. Ordered x-ray. Continue pain control.   Positive D dimer V/Q scan showed single matched lower lobe segmental defect corresponding to the focal opacity on the chest x-ray.  Low probability of PE Venous Dopplers negative for DVT in LE Initially was on IV heparin which is now discontinued.   Lupus (HCC) Continue Plaquenil   Asthma Mild wheezing noted, placed on Pulmicort, Brovana, DuoNebs   Hypertensive urgency BP improving, continue labetalol 300 mg twice daily, Plendil 10 mg daily Add hydralazine IV as needed with parameters   GERD with hiatal hernia -Placed on Pepcid twice daily   Obesity Body mass index is 32.42 kg/m.

## 2022-09-21 NOTE — Progress Notes (Signed)
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  Currently being treated for multifocal pneumonia as well as AKI on CKD.  Assessment and Plan: Multifocal pneumonia OSA No hypoxia, urine strep antigen negative.  COVID-19 negative.  Procalcitonin 1.48. Blood cultures negative till date, urine Legionella antigen pending Treated with antibiotics. Examination improving.  Currently on room air. -Per patient, she has history of OSA, has CPAP at home but noncompliant.  Will try CPAP inpatient   Acute renal failure superimposed on stage 5 chronic kidney disease, not on chronic dialysis (HCC), metabolic acidosis History of lupus nephritis Hypercalcemia Baseline creatinine 5.61 on 09/06/2022, function has been gradually worsening in the last 3 months Follows transplant center at [facility] and nephrology, Dr. Marisue Humble -Renal ultrasound showed medical renal disease, no obstruction or hydronephrosis.  -nephrology following, Renal function improving with IV fluid.  Will monitor without fluid and follow response.  LLQ pain. Ordered x-ray. Continue pain control.   Positive D dimer V/Q scan showed single matched lower lobe segmental defect corresponding to the focal opacity on the chest x-ray.  Low probability of PE Venous Dopplers negative for DVT in LE Initially was on IV heparin which is now discontinued.   Lupus (HCC) Continue Plaquenil   Asthma Mild wheezing noted, placed on Pulmicort, Brovana, DuoNebs   Hypertensive urgency BP improving, continue labetalol 300 mg twice daily, Plendil 10 mg daily Add hydralazine IV as needed with parameters   GERD with hiatal  hernia -Placed on Pepcid twice daily   Obesity Body mass index is 32.42 kg/m.    Subjective: No nausea no vomiting no fever no chills.  No chest pain.  Reports left-sided pain but are getting more on the abdomen rather than on the chest area.  Physical Exam: General: in Mild distress, No Rash Cardiovascular: S1 and S2 Present, No Murmur Respiratory: Good respiratory effort, Bilateral Air entry present. No Crackles, No wheezes Abdomen: Bowel Sound present, LLQ tenderness Extremities: No edema Neuro: Alert and oriented x3, no new focal deficit  Data Reviewed: I have Reviewed nursing notes, Vitals, and Lab results. Since last encounter, pertinent lab results CBC and BMP   . I have ordered test including CBC and BMP  . I have discussed pt's care plan and test results with nephrology  . I have ordered imaging x-ray abdomen  .  Disposition: Status is: Inpatient Remains inpatient appropriate because: Monitoring renal function  heparin injection 5,000 Units Start: 09/19/22 2200   Family Communication: Family at bedside Level of care: Progressive   Vitals:   09/21/22 0500 09/21/22 0533 09/21/22 0841 09/21/22 1253  BP:  (!) 160/91  121/79  Pulse:  82  66  Resp:  18  18  Temp:  98.2 F (36.8 C)  97.9 F (36.6 C)  TempSrc:  Oral  Oral  SpO2:  93% 93% 95%  Weight: 80.4 kg     Height:         Author: Lynden Oxford, MD 09/21/2022 5:34 PM  Please look on [email or web address] to find out who is on call.

## 2022-09-22 DIAGNOSIS — J189 Pneumonia, unspecified organism: Secondary | ICD-10-CM | POA: Diagnosis not present

## 2022-09-22 LAB — BASIC METABOLIC PANEL
Anion gap: 9 (ref 5–15)
BUN: 72 mg/dL — ABNORMAL HIGH (ref 8–23)
CO2: 23 mmol/L (ref 22–32)
Calcium: 11.4 mg/dL — ABNORMAL HIGH (ref 8.9–10.3)
Chloride: 103 mmol/L (ref 98–111)
Creatinine, Ser: 4.89 mg/dL — ABNORMAL HIGH (ref 0.44–1.00)
GFR, Estimated: 9 mL/min — ABNORMAL LOW (ref >60)
Glucose, Bld: 97 mg/dL (ref 70–99)
Potassium: 4.1 mmol/L (ref 3.5–5.1)
Sodium: 135 mmol/L (ref 135–145)

## 2022-09-22 LAB — LEGIONELLA PNEUMOPHILA SEROGP 1 UR AG: L. pneumophila Serogp 1 Ur Ag: NEGATIVE

## 2022-09-22 MED ORDER — BENZONATATE 100 MG PO CAPS: 100.0000 mg | ORAL_CAPSULE | Freq: Three times a day (TID) | ORAL | 0 refills | Status: DC

## 2022-09-22 MED ORDER — DOXYCYCLINE HYCLATE 100 MG PO TABS: 100.0000 mg | ORAL_TABLET | Freq: Two times a day (BID) | ORAL | 0 refills | Status: AC

## 2022-09-22 MED ORDER — METHOCARBAMOL 500 MG PO TABS: 250.0000 mg | ORAL_TABLET | Freq: Three times a day (TID) | ORAL | 0 refills | Status: DC | PRN

## 2022-09-22 MED ORDER — FUROSEMIDE 40 MG PO TABS: 40.0000 mg | ORAL_TABLET | Freq: Every day | ORAL | Status: DC | PRN

## 2022-09-22 MED ORDER — GUAIFENESIN ER 600 MG PO TB12: 600.0000 mg | ORAL_TABLET | Freq: Two times a day (BID) | ORAL | 0 refills | Status: DC

## 2022-09-22 NOTE — Plan of Care (Signed)
  Problem: Clinical Measurements: Goal: Ability to maintain clinical measurements within normal limits will improve Outcome: Progressing Goal: Diagnostic test results will improve Outcome: Progressing Goal: Respiratory complications will improve Outcome: Progressing Goal: Cardiovascular complication will be avoided Outcome: Progressing   Problem: Safety: Goal: Ability to remain free from injury will improve Outcome: Progressing

## 2022-09-22 NOTE — Progress Notes (Signed)
[facility] Progress Note  Subjective: creat down 4.9 today, much better.   Vitals:   09/22/22 0427 09/22/22 0438 09/22/22 0500 09/22/22 0736  BP: (!) 164/89  (!) 155/96   Pulse: 87     Resp: 17     Temp: 97.9 F (36.6 C)     TempSrc: Oral     SpO2: 93%   92%  Weight:  81.7 kg    Height:        Exam: Gen alert, no distress No jvd or bruits Chest CTA bilat RRR no RG Abd soft ntnd no mass or ascites +bs Ext no LE or UE edema Neuro is alert, Ox 3 , nf, no asterixis      Home meds include - albuterol, allopurinol, apixaban, symbicort, denosumab q 6 mos, felodipine 10 every day, furosemide 40 every day prn, hydroxychloroquine, labetalol 300 bid, prns/ vits/ supps     Date                           Creat               eGFR   2018                          2.27                 26 ml/min   2019                          2.22   2020                          2.46   July 2021                   3.97- 4.57        11-13 ml/min   Mar- dec 2023           3.18- 4.68        10- 13 ml/min     Jan 2024                   4.27                 11   April - may 2024        4.00- 5.46        8- 12       Jun- July 2024          5.05- 5.38        8- 9 ml/min                     09/06/22                        5.61                 8   09/18/22                      6.24                 7 ml/min   09/19/22                      5.99  7 ml/min        2018 renal US --> 10 cm kidneys no hydro, +cortical thinning bilat   8/18 UA - prot 100, rare bact, 0-5 rbc/wbc/ epis   UNa 34,  UCr 105     CXR 8/18 - IMPRESSION: 1. Moderate airspace consolidation over the left midlung as well as minimally in the lung bases compatible multifocal pneumonia.    Assessment/ Plan: AKI on CKD 5 - b/l creatinine 4.0- 5.0 from April - July 2024, eGFR 9- 12 ml/min. Creat here was 6.2 on admission in the setting of fevers, cough and suspected multifocal pna. UA unremarkable, urine lytes c/w advanced  renal failure. No hypotension, BP's high. AKI is likely due to vol depletion + home lasix usage. Bolused 1.5 L yesterday 8/20 and IVF"s continued at 100 cc/hr. Creat down to 5.5 yest and 4.9 today. Ok for [employer]. She knows to f/u w/ Dr Marisue Humble. Should only take po lasix 40 every day PRN for wt gain 3-5 lbs. Will sign off.  Metabolic acidosis - improving. DC'd IVF's.  PNA - on IV rocephin and doxycyline, per pmd.  ^D-dimer - started on IV heparin empirically, awaiting V/Q scan.  HTN - getting home meds here Hypercalcemia - Vit D3 level is wnl (60s), calcitriol and pth pending. Per Dr Marisue Humble this problem is not new and they are treating it in the outpt setting w/ denosumab injections.  H/o SLE - taking hydroxychloroquine            Vinson Moselle MD  [facility] 09/22/2022, 1:01 PM  Recent Labs  Lab 09/18/22 1231 09/19/22 0358 09/20/22 0245 09/21/22 0433 09/22/22 0417  HGB 10.2*   < > 9.6* 9.3*  --   ALBUMIN 3.5  --   --   --   --   CALCIUM 12.6*   < > 10.9* 11.7* 11.4*  CREATININE 6.24*   < > 6.03* 5.50* 4.89*  K 4.7   < > 4.1 4.2 4.1   < > = values in this interval not displayed.   No results for input(s): "IRON", "TIBC", "FERRITIN" in the last 168 hours. Inpatient medications:  allopurinol  250 mg Oral Daily   arformoterol  15 mcg Nebulization BID   benzonatate  100 mg Oral TID   budesonide (PULMICORT) nebulizer solution  0.25 mg Nebulization BID   Chlorhexidine Gluconate Cloth  6 each Topical Q0600   doxycycline  100 mg Oral Q12H   famotidine  20 mg Oral Daily   felodipine  10 mg Oral Daily   guaiFENesin  600 mg Oral BID   heparin injection (subcutaneous)  5,000 Units Subcutaneous Q8H   hydroxychloroquine  200 mg Oral Daily   labetalol  300 mg Oral BID   lidocaine  1 patch Transdermal Q24H   sodium chloride flush  3 mL Intravenous Q12H     acetaminophen **OR** acetaminophen, albuterol, guaiFENesin-dextromethorphan, hydrALAZINE, HYDROmorphone (DILAUDID) injection,  ipratropium-albuterol, methocarbamol, ondansetron (ZOFRAN) IV, oxyCODONE

## 2022-09-23 LAB — CULTURE, BLOOD (ROUTINE X 2)
Culture: NO GROWTH
Culture: NO GROWTH
Special Requests: ADEQUATE
Special Requests: ADEQUATE

## 2022-09-27 ENCOUNTER — Encounter (HOSPITAL_COMMUNITY): Payer: Self-pay

## 2022-09-28 NOTE — Discharge Summary (Signed)
Physician Discharge Summary   Patient: Colleen Obrien MRN: [id number] DOB: 1955/02/29  Admit date:     09/18/2022  Discharge date: 09/22/2022  Discharge Physician: Lynden Oxford  PCP: [name], MD  Recommendations at discharge:  Follow up with PCP in 1 week and follow up with Nephrology    Follow-up Information     [name], MD. Schedule an appointment as soon as possible for a visit in 2 week(s).   Specialty: Family Medicine Contact information: [phone] [address] [place] [address] [phone]         Arita Miss, MD. Schedule an appointment as soon as possible for a visit in 1 week(s).   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  Currently being treated for multifocal pneumonia as well as AKI on CKD.  Assessment and Plan: Multifocal pneumonia OSA No hypoxia, urine strep antigen negative.  COVID-19 negative.  Procalcitonin 1.48. Blood cultures negative till date, Treated with antibiotics. Examination improving.  Currently on room air. Used cpap on hospital and will use at home as well.    Acute renal failure superimposed on stage 5 chronic kidney disease, not on chronic dialysis (HCC), metabolic acidosis History of lupus nephritis Hypercalcemia Baseline creatinine 5.61 on 09/06/2022, function has been gradually worsening in the last 3  months Follows transplant center at [facility] and nephrology, Dr. Marisue Humble -Renal ultrasound showed medical renal disease, no obstruction or hydronephrosis.  -nephrology consulted.  Renal function improving with IV fluid.  LLQ pain.since April. x-ray unremarkable., likely musculoskeletal pain given chronicity  Continue pain control.   Positive D dimer V/Q scan showed single matched lower lobe segmental defect corresponding to the focal opacity on the chest x-ray.  Low probability of PE Venous Dopplers negative for DVT in LE Initially was on IV heparin which is now discontinued.   Lupus (HCC) Continue Plaquenil   Asthma Mild wheezing resolved.  placed on Pulmicort, Brovana, DuoNebs   Hypertensive urgency BP improving, continue labetalol 300 mg twice daily, Plendil 10 mg daily   GERD with hiatal hernia -Placed on Pepcid twice daily   Obesity Body mass index is 32.42 kg/m.   Consultants:  Nephrology   Procedures performed:  none  DISCHARGE MEDICATION: Allergies as of 09/22/2022       Reactions   Erythromycin Nausea And Vomiting   Levofloxacin    Causes muscle pain    Sulfa Antibiotics Hives        Medication List     STOP taking these medications    Eliquis 2.5 MG Tabs tablet Generic drug: apixaban       TAKE these medications    acetaminophen 500 MG tablet Commonly known as: TYLENOL Take 500 mg by mouth every 6 (six) hours as needed (for pain.).   albuterol 108 (90 Base) MCG/ACT inhaler Commonly known as: VENTOLIN HFA Inhale 2 puffs into  the lungs every 6 (six) hours as needed for wheezing or shortness of breath.   allopurinol 100 MG tablet Commonly known as: ZYLOPRIM Take 250 mg by mouth daily.   benzonatate 100 MG capsule Commonly known as: TESSALON Take 1 capsule (100 mg total) by mouth 3 (three) times daily.   budesonide-formoterol 80-4.5 MCG/ACT inhaler Commonly known as: SYMBICORT Inhale 2 puffs into the lungs 2 (two) times daily as needed  (asthma).   denosumab 60 MG/ML Soln injection Commonly known as: PROLIA Inject 60 mg into the skin every 6 (six) months. Administer in upper arm, thigh, or abdomen   felodipine 10 MG 24 hr tablet Commonly known as: PLENDIL Take 10 mg by mouth daily.   furosemide 40 MG tablet Commonly known as: LASIX Take 1 tablet (40 mg total) by mouth daily as needed (weight gain of 3 lbs in 1 day or 5 lbs over 1 week.). What changed: reasons to take this   guaiFENesin 600 MG 12 hr tablet Commonly known as: MUCINEX Take 1 tablet (600 mg total) by mouth 2 (two) times daily.   hydroxychloroquine 200 MG tablet Commonly known as: PLAQUENIL Take 200 mg by mouth daily.   labetalol 300 MG tablet Commonly known as: NORMODYNE Take 300 mg by mouth 2 (two) times daily.   methocarbamol 500 MG tablet Commonly known as: ROBAXIN Take 0.5 tablets (250 mg total) by mouth every 8 (eight) hours as needed for muscle spasms.   Vitamin D (Ergocalciferol) 1.25 MG (50000 UNIT) Caps capsule Commonly known as: DRISDOL Take 1 capsule (50,000 Units total) by mouth every 7 (seven) days. What changed: additional instructions       ASK your doctor about these medications    doxycycline 100 MG tablet Commonly known as: VIBRA-TABS Take 1 tablet (100 mg total) by mouth 2 (two) times daily for 1 day. Ask about: Should I take this medication?       Disposition: Home Diet recommendation: Renal diet  Discharge Exam: Vitals:   09/22/22 0438 09/22/22 0500 09/22/22 0736 09/22/22 1305  BP:  (!) 155/96  126/70  Pulse:    68  Resp:    20  Temp:    98.2 F (36.8 C)  TempSrc:    Oral  SpO2:   92% 90%  Weight: 81.7 kg     Height:       General: Appear in no distress; no visible Abnormal Neck Mass Or lumps, Conjunctiva normal Cardiovascular: S1 and S2 Present, no Murmur, Respiratory: good respiratory effort, Bilateral Air entry present and CTA, no Crackles, no wheezes Abdomen: Bowel Sound present, Non  tender Extremities: no Pedal edema Neurology: alert and oriented to time, place, and person  Filed Weights   09/20/22 0500 09/21/22 0500 09/22/22 0438  Weight: 84.4 kg 80.4 kg 81.7 kg   Condition at discharge: stable  The results of significant diagnostics from this hospitalization (including imaging, microbiology, ancillary and laboratory) are listed below for reference.   Imaging Studies: DG Abd 2 Views  Result Date: 09/21/2022 CLINICAL DATA:  Left lower quadrant pain EXAM: ABDOMEN - 2 VIEW COMPARISON:  Pelvis x-ray 08/18/2020. CT abdomen and pelvis 11/10/2014 FINDINGS: The bowel gas pattern is normal. There is no evidence of free air. No suspicious calcifications are identified. Calcified uterine fibroids are again seen. There is a left hip arthroplasty. IMPRESSION: Nonobstructive bowel gas pattern. Electronically Signed   By: Darliss Cheney M.D.   On: 09/21/2022 19:00   US RENAL  Result Date: 09/19/2022 CLINICAL DATA:  Acute renal failure, chronic kidney disease. EXAM: RENAL / URINARY TRACT ULTRASOUND COMPLETE COMPARISON:  Renal ultrasound 06/13/2016. FINDINGS: Right Kidney: Renal measurements: 10.0 x 4.4 x 5.0 cm = volume: 114 mL. Echogenicity is increased. There is no hydronephrosis. There is 1.7 x 1.6 x 1.4 cm cyst in the lower pole. Left Kidney: Renal measurements: 10.9 x 5.0 x 5.0 cm = volume: 140 mL. Echogenicity is increased. There is no hydronephrosis. There is a 1.5 x 1.3 x 1.5 cm cyst in the upper pole. Bladder: Appears normal for degree of bladder distention. Other: None. IMPRESSION: 1. Increased echogenicity in the kidneys suggests medical renal disease. There is no hydronephrosis. 2. Bilateral renal cysts. Electronically Signed   By: Darliss Cheney M.D.   On: 09/19/2022 22:54   VAS [name] LOWER EXTREMITY VENOUS (DVT)  Result Date: 09/19/2022  Lower Venous DVT Study Patient Name:  SAVY SERATT [facility]  Date of Exam:   09/19/2022 Medical Rec #: [id number]                  Accession  #:    [id number] Date of Birth: Jun 18, 1955                  Patient Gender: F Patient Age:   40 years Exam Location:  [facility] Procedure:      VAS [name] LOWER EXTREMITY VENOUS (DVT) Referring Phys: RIPUDEEP RAI --------------------------------------------------------------------------------  Indications: Edema.  Risk Factors: Limited mobility obesity. Anticoagulation: Heparin. Comparison Study: No significant changes seen since prior exam 05/19/22 Performing Technologist: Shona Simpson  Examination Guidelines: A complete evaluation includes B-mode imaging, spectral Doppler, color Doppler, and power Doppler as needed of all accessible portions of each vessel. Bilateral testing is considered an integral part of a complete examination. Limited examinations for reoccurring indications may be performed as noted. The reflux portion of the exam is performed with the patient in reverse Trendelenburg.  +---------+---------------+---------+-----------+----------+--------------+ RIGHT    CompressibilityPhasicitySpontaneityPropertiesThrombus Aging +---------+---------------+---------+-----------+----------+--------------+ CFV      Full           Yes      Yes                                 +---------+---------------+---------+-----------+----------+--------------+ SFJ      Full                                                        +---------+---------------+---------+-----------+----------+--------------+ FV Prox  Full                                                        +---------+---------------+---------+-----------+----------+--------------+ FV Mid   Full                                                        +---------+---------------+---------+-----------+----------+--------------+ FV DistalFull                                                        +---------+---------------+---------+-----------+----------+--------------+  PFV      Full                                                         +---------+---------------+---------+-----------+----------+--------------+ POP      Full           Yes      Yes                                 +---------+---------------+---------+-----------+----------+--------------+ PTV      Full                                                        +---------+---------------+---------+-----------+----------+--------------+ PERO     Full                                                        +---------+---------------+---------+-----------+----------+--------------+   +---------+---------------+---------+-----------+----------+--------------+ LEFT     CompressibilityPhasicitySpontaneityPropertiesThrombus Aging +---------+---------------+---------+-----------+----------+--------------+ CFV      Full           Yes      Yes                                 +---------+---------------+---------+-----------+----------+--------------+ SFJ      Full                                                        +---------+---------------+---------+-----------+----------+--------------+ FV Prox  Full                                                        +---------+---------------+---------+-----------+----------+--------------+ FV Mid   Full                                                        +---------+---------------+---------+-----------+----------+--------------+ FV DistalFull                                                        +---------+---------------+---------+-----------+----------+--------------+ PFV      Full                                                        +---------+---------------+---------+-----------+----------+--------------+  POP      Full           Yes      Yes                                 +---------+---------------+---------+-----------+----------+--------------+ PTV      Full                                                         +---------+---------------+---------+-----------+----------+--------------+ PERO     Full                                                        +---------+---------------+---------+-----------+----------+--------------+     Summary: BILATERAL: - No evidence of deep vein thrombosis seen in the lower extremities, bilaterally. -No evidence of popliteal cyst, bilaterally.   *See table(s) above for measurements and observations. Electronically signed by Sherald Hess MD on 09/19/2022 at 6:23:03 PM.    Final    NM Pulmonary Perfusion  Result Date: 09/19/2022 CLINICAL DATA:  Low to intermediate probability pulmonary embolism, positive D-dimer, asthma, chest pain EXAM: NUCLEAR MEDICINE PERFUSION LUNG SCAN TECHNIQUE: Perfusion images were obtained in multiple projections after intravenous injection of radiopharmaceutical. Ventilation scans intentionally deferred if perfusion scan and chest x-ray adequate for interpretation during COVID 19 epidemic. RADIOPHARMACEUTICALS:  4.1 mCi Tc-63m MAA IV COMPARISON:  None available. Findings are correlated with chest radiograph of 09/18/2022 FINDINGS: There is a wedge-shaped perfusion defect involving the anteromedial segment of the left lower lobe corresponding to the focal opacity seen on accompanying chest radiograph. Otherwise normal distribution of radiotracer. IMPRESSION: Single matched lower lobe segmental defect compatible with a low probability for pulmonary embolism. Electronically Signed   By: Helyn Numbers M.D.   On: 09/19/2022 14:54   DG Chest 2 View  Result Date: 09/18/2022 CLINICAL DATA:  Left chest pain and cough with body aches and fatigue as well as fever 3-4 days. EXAM: CHEST - 2 VIEW COMPARISON:  08/21/2019 FINDINGS: Lungs are adequately inflated demonstrate moderate airspace consolidation over the left midlung as well as minimally in the lung bases compatible multifocal pneumonia. No effusion. Findings suggesting a hiatal hernia.  Cardiomediastinal silhouette and remainder the exam is unchanged. Possible old right humeral neck fracture. IMPRESSION: 1. Moderate airspace consolidation over the left midlung as well as minimally in the lung bases compatible multifocal pneumonia. 2. Hiatal hernia. Electronically Signed   By: Elberta Fortis M.D.   On: 09/18/2022 13:05    Microbiology: Results for orders placed or performed during the hospital encounter of 09/18/22  SARS Coronavirus 2 by RT PCR (hospital order, performed in [facility] hospital lab) *cepheid single result test* Anterior Nasal Swab     Status: None   Collection Time: 09/18/22 12:02 PM   Specimen: Anterior Nasal Swab  Result Value Ref Range Status   SARS Coronavirus 2 by RT PCR NEGATIVE NEGATIVE Final    Comment: (NOTE) SARS-CoV-2 target nucleic acids are NOT DETECTED.  The SARS-CoV-2 RNA is generally detectable in upper and lower respiratory specimens during the acute phase of infection. The lowest concentration of  SARS-CoV-2 viral copies this assay can detect is 250 copies / mL. A negative result does not preclude SARS-CoV-2 infection and should not be used as the sole basis for treatment or other patient management decisions.  A negative result may occur with improper specimen collection / handling, submission of specimen other than nasopharyngeal swab, presence of viral mutation(s) within the areas targeted by this assay, and inadequate number of viral copies (<250 copies / mL). A negative result must be combined with clinical observations, patient history, and epidemiological information.  Fact Sheet for Patients:   RoadLapTop.co.za  Fact Sheet for Healthcare Providers: http://kim-miller.com/  This test is not yet approved or  cleared by the [place] FDA and has been authorized for detection and/or diagnosis of SARS-CoV-2 by FDA under an Emergency Use Authorization (EUA).  This EUA will remain in  effect (meaning this test can be used) for the duration of the COVID-19 declaration under Section 564(b)(1) of the Act, 21 U.S.C. section 360bbb-3(b)(1), unless the authorization is terminated or revoked sooner.  Performed at [facility], [address], [place], [place] [address]   Blood culture (routine x 2)     Status: None   Collection Time: 09/18/22  2:51 PM   Specimen: BLOOD  Result Value Ref Range Status   Specimen Description   Final    BLOOD BLOOD LEFT ARM Performed at [facility], [address] [place], [place] [address]    Special Requests   Final    BOTTLES DRAWN AEROBIC AND ANAEROBIC Blood Culture adequate volume Performed at [facility], [address], [place], [place] [address]    Culture   Final    NO GROWTH 5 DAYS Performed at [facility], [address] [place], [place] [address]    Report Status 09/23/2022 FINAL  Final  Blood culture (routine x 2)     Status: None   Collection Time: 09/18/22  2:51 PM   Specimen: BLOOD  Result Value Ref Range Status   Specimen Description   Final    BLOOD BLOOD RIGHT ARM Performed at [facility], [address] [place], [place] [address]    Special Requests   Final    BOTTLES DRAWN AEROBIC AND ANAEROBIC Blood Culture adequate volume Performed at [facility], [address] [place], [place] [address]    Culture   Final    NO GROWTH 5 DAYS Performed at [facility], [address] [place], [place] [address]    Report Status 09/23/2022 FINAL  Final  MRSA Next Gen by PCR, Nasal     Status: None   Collection Time: 09/18/22  4:19 PM   Specimen: Nasal Mucosa; Nasal Swab  Result Value Ref Range Status   MRSA by PCR Next Gen NOT DETECTED NOT DETECTED Final    Comment: (NOTE) The GeneXpert MRSA Assay (FDA approved for NASAL specimens only), is one component of a comprehensive MRSA colonization  surveillance program. It is not intended to diagnose MRSA infection nor to guide or monitor treatment for MRSA infections. Test performance is not FDA approved in patients less than 8 years old. Performed at [facility], [address], [place], [place] [address]    Labs: CBC: No results for input(s): "WBC", "NEUTROABS", "HGB", "HCT", "MCV", "PLT" in the last 168 hours. Basic Metabolic Panel: Recent Labs  Lab 09/22/22 0417  NA 135  K 4.1  CL 103  CO2 23  GLUCOSE 97  BUN  72*  CREATININE 4.89*  CALCIUM 11.4*   Liver Function Tests: No results for input(s): "AST", "ALT", "ALKPHOS", "BILITOT", "PROT", "ALBUMIN" in the last 168 hours. CBG: No results for input(s): "GLUCAP" in the last 168 hours.  Discharge time spent: greater than 30 minutes.  Author: Lynden Oxford, MD  [facility] 09/22/2022

## 2022-09-29 DIAGNOSIS — N185 Chronic kidney disease, stage 5: Secondary | ICD-10-CM | POA: Diagnosis not present

## 2022-09-29 DIAGNOSIS — R1030 Lower abdominal pain, unspecified: Secondary | ICD-10-CM | POA: Diagnosis not present

## 2022-09-29 DIAGNOSIS — M3210 Systemic lupus erythematosus, organ or system involvement unspecified: Secondary | ICD-10-CM | POA: Diagnosis not present

## 2022-09-29 DIAGNOSIS — I120 Hypertensive chronic kidney disease with stage 5 chronic kidney disease or end stage renal disease: Secondary | ICD-10-CM | POA: Diagnosis not present

## 2022-09-29 DIAGNOSIS — Z9989 Dependence on other enabling machines and devices: Secondary | ICD-10-CM | POA: Diagnosis not present

## 2022-09-29 DIAGNOSIS — Z8701 Personal history of pneumonia (recurrent): Secondary | ICD-10-CM | POA: Diagnosis not present

## 2022-09-29 DIAGNOSIS — D84821 Immunodeficiency due to drugs: Secondary | ICD-10-CM | POA: Diagnosis not present

## 2022-10-04 ENCOUNTER — Inpatient Hospital Stay (HOSPITAL_COMMUNITY): Admission: RE | Admit: 2022-10-04 | Payer: Medicare PPO | Source: Ambulatory Visit

## 2022-10-04 DIAGNOSIS — Z01810 Encounter for preprocedural cardiovascular examination: Secondary | ICD-10-CM | POA: Diagnosis not present

## 2022-10-04 DIAGNOSIS — R0609 Other forms of dyspnea: Secondary | ICD-10-CM | POA: Diagnosis not present

## 2022-10-04 DIAGNOSIS — Z01818 Encounter for other preprocedural examination: Secondary | ICD-10-CM | POA: Diagnosis not present

## 2022-10-04 DIAGNOSIS — I120 Hypertensive chronic kidney disease with stage 5 chronic kidney disease or end stage renal disease: Secondary | ICD-10-CM | POA: Diagnosis not present

## 2022-10-04 DIAGNOSIS — N186 End stage renal disease: Secondary | ICD-10-CM | POA: Diagnosis not present

## 2022-10-05 DIAGNOSIS — E875 Hyperkalemia: Secondary | ICD-10-CM | POA: Diagnosis not present

## 2022-10-12 DIAGNOSIS — I120 Hypertensive chronic kidney disease with stage 5 chronic kidney disease or end stage renal disease: Secondary | ICD-10-CM | POA: Diagnosis not present

## 2022-10-12 DIAGNOSIS — N2581 Secondary hyperparathyroidism of renal origin: Secondary | ICD-10-CM | POA: Diagnosis not present

## 2022-10-12 DIAGNOSIS — N179 Acute kidney failure, unspecified: Secondary | ICD-10-CM | POA: Diagnosis not present

## 2022-10-12 DIAGNOSIS — N185 Chronic kidney disease, stage 5: Secondary | ICD-10-CM | POA: Diagnosis not present

## 2022-10-12 DIAGNOSIS — N189 Chronic kidney disease, unspecified: Secondary | ICD-10-CM | POA: Diagnosis not present

## 2022-10-12 DIAGNOSIS — L930 Discoid lupus erythematosus: Secondary | ICD-10-CM | POA: Diagnosis not present

## 2022-10-12 DIAGNOSIS — D631 Anemia in chronic kidney disease: Secondary | ICD-10-CM | POA: Diagnosis not present

## 2022-10-13 ENCOUNTER — Encounter: Payer: Self-pay | Admitting: Physical Therapy

## 2022-10-13 ENCOUNTER — Ambulatory Visit (INDEPENDENT_AMBULATORY_CARE_PROVIDER_SITE_OTHER): Payer: Medicare PPO | Admitting: Physical Therapy

## 2022-10-13 DIAGNOSIS — M25561 Pain in right knee: Secondary | ICD-10-CM

## 2022-10-13 DIAGNOSIS — M25552 Pain in left hip: Secondary | ICD-10-CM

## 2022-10-13 DIAGNOSIS — R262 Difficulty in walking, not elsewhere classified: Secondary | ICD-10-CM

## 2022-10-13 DIAGNOSIS — M79661 Pain in right lower leg: Secondary | ICD-10-CM | POA: Diagnosis not present

## 2022-10-13 DIAGNOSIS — M6281 Muscle weakness (generalized): Secondary | ICD-10-CM

## 2022-10-13 DIAGNOSIS — R600 Localized edema: Secondary | ICD-10-CM | POA: Diagnosis not present

## 2022-10-13 NOTE — Therapy (Signed)
OUTPATIENT PHYSICAL THERAPY TREATMENT/RE-EVAL      Patient Name: Colleen Obrien MRN: [id number] DOB:08-10-1955, 67 y.o., female Today's Date: 07/20/2022  Referring diagnosis? Tibia fracture  Diagnosis  M79.661 (ICD-10-CM) - Pain in right lower leg    Treatment diagnosis? (if different than referring diagnosis) R26.2   M62.81   M25.561   R60.0 What was this (referring dx) caused by? [x]  Surgery [x]  Fall []  Ongoing issue []  Arthritis [x]  Other: ___tibial plateau fracture_________   Laterality: [x]  Rt []  Lt []  Both   Check all possible CPT codes:             *CHOOSE 10 OR LESS*                          []  97110 (Therapeutic Exercise)             []  92507 (SLP Treatment)  []  97112 (Neuro Re-ed)                           []  92526 (Swallowing Treatment)             []  97116 (Gait Training)                           []  [id number] (Cognitive Training, 1st 15 minutes) []  97140 (Manual Therapy)                                []  97130 (Cognitive Training, each add'l 15 minutes)   []  97164 (Re-evaluation)                              []  Other, List CPT Code ____________  []  97530 (Therapeutic Activities)                                    []  97535 (Self Care)                      [x]  All codes above (97110 - 97535)            []  97012 (Mechanical Traction)            []  97014 (E-stim Unattended)            []  97032 (E-stim manual)            []  97033 (Ionto)            []  97035 (Ultrasound) []  97750 (Physical Performance Training) []  [id number] (Aquatic Therapy) [x]  97016 (Vasopneumatic Device) []  [id number] (Paraffin) []  72536 (Contrast Bath) []  97597 (Wound Care 1st 20 sq cm) []  97598 (Wound Care each add'l 20 sq cm) []  97760 (Orthotic Fabrication, Fitting, Training Initial) []  [id number] (Prosthetic Management and Training Initial) []  [id number] (Orthotic or Prosthetic Training/ Modification Subsequent)     PT End of Session - 10/13/22 0853     Visit Number 11    Number of Visits 23     Date for PT Re-Evaluation 11/24/22    Authorization Type [employer]    Authorization Time Period new auth 10/13/22 to 11/24/22    Authorization - Visit Number 11    Authorization - Number of Visits 12   [employer] reauth  given to front desk 10/13/22   PT Start Time 0855   pt late   PT Stop Time 0927    PT Time Calculation (min) 32 min    Activity Tolerance Patient tolerated treatment well    Behavior During Therapy WFL for tasks assessed/performed                     Past Medical History:  Diagnosis Date   Anemia in chronic kidney disease (CKD)     Arthritis     Asthma     Blood dyscrasia      systemic lupus   Chronic kidney disease (CKD), active medical management without dialysis, stage 4 (severe) (HCC)     Dyspnea     GERD (gastroesophageal reflux disease)     Gout     Hypertension     Lupus (HCC)     Narcolepsy     Osteoporosis 05/05/2022   Pneumonia      x 1   Sleep apnea      tries to use CPAP nightly as of 04/29/22   Walker as Set designer dependent      when going outside home         Past Surgical History:  Procedure Laterality Date   CESAREAN SECTION        x 2   COLONOSCOPY       ganglion cyst removed Right      arm   TIBIA IM NAIL INSERTION Right 05/03/2022    Procedure: INTRAMEDULLARY (IM) NAIL TIBIAL;  Surgeon: Myrene Galas, MD;  Location: [facility] OR;  Service: Orthopedics;  Laterality: Right;   TOTAL HIP ARTHROPLASTY Left 08/26/2019    Procedure: LEFT TOTAL HIP ARTHROPLASTY ANTERIOR APPROACH;  Surgeon: Tarry Kos, MD;  Location: [facility] OR;  Service: Orthopedics;  Laterality: Left;   TUBAL LIGATION            Patient Active Problem List    Diagnosis Date Noted   Osteoporosis 05/05/2022   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 05/03/2022   Anemia of chronic renal failure 03/08/2021   Chronic renal disease, stage IV (HCC) 03/08/2021   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   AVN (avascular necrosis of bone)  (HCC) 07/08/2019   Diverticulosis of colon without hemorrhage 07/08/2019   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  Surgery in 05/03/2022.     SUBJECTIVE:    SUBJECTIVE STATEMENT:  Since I was last here I ended up getting pneumonia and haven't done a lot since then. My surgical leg had been getting better, but last week I had a big pop in my left hip and now when I walk it really hurts. Had to go back to the walker due to pain. Still waiting for a kidney transplant, still don't have a time line on this. I cracked my right shoulder in 2023 and its bothering me again, I have a paper order  for PT from the MD will bring it next time.    PERTINENT HISTORY: CKD, arthritis, asthma, HTN, Lt THA  PAIN:  NPRS scale: now at rest/seated 0/10; at worst 4/10 Pain location: R shin/calf and L hip Pain description: R shin- can have like a burning aspect and feels like it will pop; L hip- sharp jabbing  Aggravating factors: R shin- movement/WB; L hip- walking on it/WB  Relieving factors: R shin- rest/cold pack; L hip- tylenol and heat    PRECAUTIONS: None   WEIGHT BEARING RESTRICTIONS: No   FALLS:  Has patient fallen in last 6 months? 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 No immediate postoperative complication.   PATIENT SURVEYS:  08/17/2022:  FOTO 57; 10/13/22 42.2  07/20/2022 Eval FOTO 47 (risk-adjusted 45, Goal 60 in 15 visits)   COGNITION: 07/20/2022 Overall cognitive status: Within functional limits for tasks assessed                         SENSATION: 07/20/2022 Burning of inferior and medial knee, occasional shocking to lateral 2 toes from the medial ankle   EDEMA:  07/20/2022 Noted and not assssed secondary to starting 7 minutes late     LOWER EXTREMITY ROM:   Active ROM Left/Right 07/20/2022    Hip flexion      Hip extension      Hip abduction      Hip adduction      Hip internal rotation      Hip external rotation      Knee flexion 130/132    Knee extension 0/0    Ankle dorsiflexion      Ankle plantarflexion      Ankle inversion      Ankle eversion       (Blank rows = not tested)   LOWER EXTREMITY STRENGTH:   MMT Left/Right 07/20/2022  Right 08/17/2022 Left 08/17/2022 Right 08/30/2022 Left/Right in pounds 09/01/2022 Left/Right 10/13/22  Hip flexion    5/5 5/5   4/4  Hip extension          Hip abduction 4-/4-      3/2+  Hip adduction          Hip internal rotation          Hip external rotation          Knee flexion    5/5 5/5   4/4  Knee extension 4/4- 4/5 4+/5 4+/5 40.5/24.8 4+/4  Ankle dorsiflexion          Ankle plantarflexion          Ankle inversion          Ankle eversion           (Blank rows = not tested)     GAIT: 09/05/2022:  Ambulation into and in clinic independently.   08/10/2022:   SPC use in clinic (in Lt hand) with variable step to and step through gait pattern.   07/20/2022 Distance walked: 100 feet Assistive device utilized: Environmental consultant - 2 wheeled  Level of assistance:  Modified independence Comments: Limp on the right side due to pain, quadriceps and hip abductors weakness                     TODAY'S TREATMENT 10/13/22  FOTO/MMT/goal review   TherEx  Nustep L4 x6 minutes BLEs only Standing hip hikes x10 B with BUE support  Mini-squats in front of chair x6 mod cues for form         TODAY'S TREATMENT:                                                              DATE 09/13/2022 TherEx Nustep Lvl 6 6 mins UE/LE Leg press double leg 87 lbs x 15, single leg 2 x 10 bilateral 43 lbs   Neuro Re-ed Tandem stance 1 min x 1 bilateral on foam c occasional HHA required on bar  SLS with black mat corner touching contralateral leg x 8 each corner, performed bilaterally  Fitter rocker board fwd/back light touch x 20 with SBA and occasional to moderate HHA on bar   TODAY'S TREATMENT:                                                              DATE 09/05/2022 TherEx Recumbent bike seat 3 lvl 1-2 8 mins Leg press double leg 87 lbs x 15, single leg 2 x 10 bilateral 43 lbs  Incline gastroc stretch 30 sec x 3 bilateral  Stair navigation 4 step up/down.  Due to Lt knee complaints, Rt leg showed as more comfortable to WB.    Neuro Re-ed Tandem stance 1 min x 2 bilateral on foam c occasional HHA required on bar  Alternating PF/DF on foam with occasional HHA x 20 for ankle strategy improvements Alternating toe tapping 4 inch step forward 2 x 10 bilateral with CGA  TODAY'S TREATMENT:                                                              DATE: 09/01/2022 Recumbent bike Seat 3 for 5 minutes, Level 3 Seated straight leg raises 3 sets of 5 with 1# slow eccentrics  Upper heel cords stretch 4 x 20 seconds Lower heel cords stretch 4 x 20 seconds Standing heel raises 10 x 3 seconds  Functional Activities: Double Leg Press 100# 2 sets of 10 slow eccentrics Single Leg Press 43# 10 x slow eccentrics      PATIENT EDUCATION:  07/20/2022 Education details:  Reviewed exam findings and home exercise program Person educated: Patient Education method: Explanation, Demonstration, Tactile cues, Verbal cues, and Handouts Education comprehension: verbalized understanding, returned demonstration, verbal cues required, tactile cues required, and needs further education   HOME EXERCISE PROGRAM: [id number]   ASSESSMENT:   CLINICAL IMPRESSION:  Colleen Obrien arrives after being out of PT for an extended period due to getting pneumonia  and having extended recovery, also had a hospitalization recently which prevented her from attending OP PT. Rechecked FOTO, MMT, and reviewed goals, otherwise worked on functional strengthening as tolerated. She is having acute L hip pain and will be seeing the MD about this soon. Also having R shoulder pain, did not bring the referral today but will plan to examine and incorporate this body region in the future once we have a copy of MD script. Will continue to progress strength and function as tolerated moving forward.    Decreased treatment time today due to late arrival time.    OBJECTIVE IMPAIRMENTS: Abnormal gait, decreased activity tolerance, decreased balance, decreased endurance, decreased knowledge of condition, difficulty walking, decreased strength, increased edema, impaired perceived functional ability, and pain.    ACTIVITY LIMITATIONS: carrying, lifting, bending, sitting, standing, squatting, stairs, transfers, and locomotion level   PARTICIPATION LIMITATIONS: cleaning, driving, shopping, and community activity   PERSONAL FACTORS: CKD, arthritis, asthma, HTN, Lt THA are also affecting patient's functional outcome.    REHAB POTENTIAL: Good   CLINICAL DECISION MAKING: Stable/uncomplicated   EVALUATION COMPLEXITY: Low     GOALS: Goals reviewed with patient? Yes   SHORT TERM GOALS: Target date: 11/03/22 Colleen Obrien will be independent with her day 1 home exercise program Baseline: Started 07/20/2022 Goal status: Met  08/19/2022   2.  Colleen Obrien will safely transition to a cane without an increase in her right knee edema Baseline: Wheeled walker Goal status:  ONGOING 10/13/22 back on RW due to pain levels/deconditioning      LONG TERM GOALS: Target date: 11/24/22   Colleen Obrien will report right knee pain consistently less than 3 out of 10 on the numeric pain rating scale Baseline: Can be as high as 6 out of 10 Goal status: ONGOING 10/13/22   2.  Improve FOTO to 60 Baseline: 47, risk adjusted 45 Goal status: ONGOING 10/13/22   3.  Improve bilateral quadriceps and hip abductor strength as assessed by MMT Baseline: See above Goal status: ONGOING 10/13/22   4.  Colleen Obrien will be able to safely transition off any assistive device without increased right knee edema Baseline: Wheeled walker Goal status: ONGOING 10/13/22   5.  Colleen Obrien will be independent with her long-term home exercise program at discharge Baseline: Started 07/20/2022 Goal status: ONGOING 10/13/22     PLAN:   PT FREQUENCY: 1-2 times per week   PT DURATION: 6 weeks    PLANNED INTERVENTIONS: Therapeutic exercises, Therapeutic activity, Neuromuscular re-education, Balance training, Gait training, Patient/Family education, Self Care, Stair training, Electrical stimulation, Cryotherapy, Vasopneumatic device, and Manual therapy   PLAN FOR NEXT SESSION:  continue to progress as able and tolerated, what did MD say about her hip?     Nedra Hai, PT, DPT 10/13/22 9:29 AM

## 2022-10-14 DIAGNOSIS — N185 Chronic kidney disease, stage 5: Secondary | ICD-10-CM | POA: Diagnosis not present

## 2022-10-19 ENCOUNTER — Encounter: Payer: Self-pay | Admitting: Physical Therapy

## 2022-10-19 ENCOUNTER — Ambulatory Visit (INDEPENDENT_AMBULATORY_CARE_PROVIDER_SITE_OTHER): Payer: Medicare PPO | Admitting: Physical Therapy

## 2022-10-19 DIAGNOSIS — M25552 Pain in left hip: Secondary | ICD-10-CM | POA: Diagnosis not present

## 2022-10-19 DIAGNOSIS — M25561 Pain in right knee: Secondary | ICD-10-CM

## 2022-10-19 DIAGNOSIS — M6281 Muscle weakness (generalized): Secondary | ICD-10-CM | POA: Diagnosis not present

## 2022-10-19 DIAGNOSIS — M79661 Pain in right lower leg: Secondary | ICD-10-CM | POA: Diagnosis not present

## 2022-10-19 DIAGNOSIS — R262 Difficulty in walking, not elsewhere classified: Secondary | ICD-10-CM | POA: Diagnosis not present

## 2022-10-19 DIAGNOSIS — R600 Localized edema: Secondary | ICD-10-CM

## 2022-10-19 NOTE — Therapy (Signed)
OUTPATIENT PHYSICAL THERAPY TREATMENT     Patient Name: Colleen Obrien MRN: [id number] DOB:January 11, 1956, 67 y.o., female Today's Date: 07/20/2022  Referring diagnosis? Tibia fracture  Diagnosis  M79.661 (ICD-10-CM) - Pain in right lower leg    Treatment diagnosis? (if different than referring diagnosis) R26.2   M62.81   M25.561   R60.0 What was this (referring dx) caused by? [x]  Surgery [x]  Fall []  Ongoing issue []  Arthritis [x]  Other: ___tibial plateau fracture_________   Laterality: [x]  Rt []  Lt []  Both   Check all possible CPT codes:             *CHOOSE 10 OR LESS*                          []  97110 (Therapeutic Exercise)             []  92507 (SLP Treatment)  []  97112 (Neuro Re-ed)                           []  92526 (Swallowing Treatment)             []  97116 (Gait Training)                           []  95284 (Cognitive Training, 1st 15 minutes) []  97140 (Manual Therapy)                                []  97130 (Cognitive Training, each add'l 15 minutes)   []  97164 (Re-evaluation)                              []  Other, List CPT Code ____________  []  97530 (Therapeutic Activities)                                    []  97535 (Self Care)                      [x]  All codes above (97110 - 97535)            []  97012 (Mechanical Traction)            []  97014 (E-stim Unattended)            []  97032 (E-stim manual)            []  97033 (Ionto)            []  97035 (Ultrasound) []  97750 (Physical Performance Training) []  [id number] (Aquatic Therapy) [x]  97016 (Vasopneumatic Device) []  [id number] (Paraffin) []  97034 (Contrast Bath) []  97597 (Wound Care 1st 20 sq cm) []  97598 (Wound Care each add'l 20 sq cm) []  97760 (Orthotic Fabrication, Fitting, Training Initial) []  [id number] (Prosthetic Management and Training Initial) []  [id number] (Orthotic or Prosthetic Training/ Modification Subsequent)     PT End of Session - 10/19/22 0910     Visit Number 12    Number of Visits 23    Date for  PT Re-Evaluation 11/24/22    Authorization Type [employer]    Authorization Time Period new auth 10/13/22 to 11/24/22    Authorization - Visit Number 12    PT Start Time 0855   pt few minutes late  PT Stop Time 0927    PT Time Calculation (min) 32 min    Activity Tolerance Patient tolerated treatment well    Behavior During Therapy WFL for tasks assessed/performed                      Past Medical History:  Diagnosis Date   Anemia in chronic kidney disease (CKD)     Arthritis     Asthma     Blood dyscrasia      systemic lupus   Chronic kidney disease (CKD), active medical management without dialysis, stage 4 (severe) (HCC)     Dyspnea     GERD (gastroesophageal reflux disease)     Gout     Hypertension     Lupus (HCC)     Narcolepsy     Osteoporosis 05/05/2022   Pneumonia      x 1   Sleep apnea      tries to use CPAP nightly as of 04/29/22   Walker as Set designer dependent      when going outside home         Past Surgical History:  Procedure Laterality Date   CESAREAN SECTION        x 2   COLONOSCOPY       ganglion cyst removed Right      arm   TIBIA IM NAIL INSERTION Right 05/03/2022    Procedure: INTRAMEDULLARY (IM) NAIL TIBIAL;  Surgeon: Myrene Galas, MD;  Location: [facility];  Service: Orthopedics;  Laterality: Right;   TOTAL HIP ARTHROPLASTY Left 08/26/2019    Procedure: LEFT TOTAL HIP ARTHROPLASTY ANTERIOR APPROACH;  Surgeon: Tarry Kos, MD;  Location: [facility] OR;  Service: Orthopedics;  Laterality: Left;   TUBAL LIGATION            Patient Active Problem List    Diagnosis Date Noted   Osteoporosis 05/05/2022   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 05/03/2022   Anemia of chronic renal failure 03/08/2021   Chronic renal disease, stage IV (HCC) 03/08/2021   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   AVN (avascular necrosis of bone) (HCC) 07/08/2019   Diverticulosis of colon without hemorrhage 07/08/2019    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  Surgery in 05/03/2022.     SUBJECTIVE:    SUBJECTIVE STATEMENT:  Doing OK, hip is better but I don't see the doctor until tomorrow. Forgot to bring the MD script for my shoulder, its still bothering me.    PERTINENT HISTORY: CKD, arthritis, asthma, HTN, Lt THA  PAIN:  NPRS scale: 2/10 Pain location: R knee  Pain description: burning feeling  Aggravating factors: movement  Relieving factors: rest and ice    PRECAUTIONS: None   WEIGHT BEARING RESTRICTIONS: No   FALLS:  Has patient fallen in last 6 months? 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 No immediate postoperative complication.   PATIENT SURVEYS:  08/17/2022:  FOTO 57; 10/13/22 42.2  07/20/2022 Eval FOTO 47 (risk-adjusted 45, Goal 60 in 15 visits)   COGNITION: 07/20/2022 Overall cognitive status: Within functional limits for tasks assessed                         SENSATION: 07/20/2022 Burning of inferior and medial knee,  occasional shocking to lateral 2 toes from the medial ankle   EDEMA:  07/20/2022 Noted and not assssed secondary to starting 7 minutes late     LOWER EXTREMITY ROM:   Active ROM Left/Right 07/20/2022    Hip flexion      Hip extension      Hip abduction      Hip adduction      Hip internal rotation      Hip external rotation      Knee flexion 130/132    Knee extension 0/0    Ankle dorsiflexion      Ankle plantarflexion      Ankle inversion      Ankle eversion       (Blank rows = not tested)   LOWER EXTREMITY STRENGTH:   MMT Left/Right 07/20/2022  Right 08/17/2022 Left 08/17/2022 Right 08/30/2022 Left/Right in pounds 09/01/2022 Left/Right 10/13/22  Hip flexion    5/5 5/5   4/4  Hip extension          Hip abduction 4-/4-      3/2+  Hip adduction          Hip internal rotation          Hip external rotation          Knee flexion    5/5 5/5   4/4  Knee extension 4/4- 4/5 4+/5 4+/5 40.5/24.8 4+/4  Ankle dorsiflexion          Ankle plantarflexion          Ankle inversion          Ankle eversion           (Blank rows = not tested)     GAIT: 09/05/2022:  Ambulation into and in clinic independently.   08/10/2022:   SPC use in clinic (in Lt hand) with variable step to and step through gait pattern.   07/20/2022 Distance walked: 100 feet Assistive device utilized: Environmental consultant - 2 wheeled Level of assistance: Modified independence Comments: Limp on the right side due to pain, quadriceps and hip abductors weakness                     TODAY'S TREATMENT 10/19/22   TherEx  Scifit bike L5x3 min 3 seconds, then 2 min 30 seconds at L4 Shuttle LE press 56# 2x15 BLEs, single leg 31# 2x12 B Bridges x12 Supine clams red TB x12  Seated TA set + hip flexion x12 B Hip ABD into red TB + STS x12   TODAY'S TREATMENT 10/13/22  FOTO/MMT/goal review   TherEx  Nustep L4 x6 minutes BLEs only Standing hip hikes x10 B with BUE support  Mini-squats  in front of chair x6 mod cues for form          TODAY'S TREATMENT:                                                              DATE 09/13/2022 TherEx Nustep Lvl 6 6 mins UE/LE Leg press double leg 87 lbs x 15, single leg 2 x 10 bilateral 43 lbs   Neuro Re-ed Tandem stance 1 min x 1 bilateral on foam c occasional HHA required on bar  SLS with black mat corner touching contralateral leg x 8 each corner, performed bilaterally  Fitter rocker board fwd/back light touch x 20 with SBA and occasional to moderate HHA on bar   TODAY'S TREATMENT:                                                              DATE 09/05/2022 TherEx Recumbent bike seat 3 lvl 1-2 8 mins Leg press double leg 87 lbs x 15, single leg 2 x 10 bilateral 43 lbs  Incline gastroc stretch 30 sec x 3 bilateral  Stair navigation 4 step up/down.  Due to Lt knee complaints, Rt leg showed as more comfortable to WB.    Neuro Re-ed Tandem stance 1 min x 2 bilateral on foam c occasional HHA required on bar  Alternating PF/DF on foam with occasional HHA x 20 for ankle strategy improvements Alternating toe tapping 4 inch step forward 2 x 10 bilateral with CGA  TODAY'S TREATMENT:                                                              DATE: 09/01/2022 Recumbent bike Seat 3 for 5 minutes, Level 3 Seated straight leg raises 3 sets of 5 with 1# slow eccentrics  Upper heel cords stretch 4 x 20 seconds Lower heel cords stretch 4 x 20 seconds Standing heel raises 10 x 3 seconds  Functional Activities: Double Leg Press 100# 2 sets of 10 slow eccentrics Single Leg Press 43# 10 x slow eccentrics      PATIENT EDUCATION:  07/20/2022 Education details: Reviewed exam findings and home exercise program Person educated: Patient Education method: Explanation, Demonstration, Tactile cues, Verbal cues, and Handouts Education comprehension: verbalized understanding, returned demonstration, verbal cues required, tactile cues required, and needs further education   HOME  EXERCISE PROGRAM: [id number]   ASSESSMENT:   CLINICAL IMPRESSION:   Colleen Obrien arrives today doing OK, her hip is feeling better but she doesn't see the MD about it until tomorrow; also forgot MD script for shoulder PT so deferred shoulder assessment this session. Worked on functional conditioning and strength as tolerated with modifications PRN, did need to adjust resistance of some exercises/machines due to her having been out for quite awhile being sick. Tolerated session well today.    Decreased treatment time today due to late arrival time.  OBJECTIVE IMPAIRMENTS: Abnormal gait, decreased activity tolerance, decreased balance, decreased endurance, decreased knowledge of condition, difficulty walking, decreased strength, increased edema, impaired perceived functional ability, and pain.    ACTIVITY LIMITATIONS: carrying, lifting, bending, sitting, standing, squatting, stairs, transfers, and locomotion level   PARTICIPATION LIMITATIONS: cleaning, driving, shopping, and community activity   PERSONAL FACTORS: CKD, arthritis, asthma, HTN, Lt THA are also affecting patient's functional outcome.    REHAB POTENTIAL: Good   CLINICAL DECISION MAKING: Stable/uncomplicated   EVALUATION COMPLEXITY: Low     GOALS: Goals reviewed with patient? Yes   SHORT TERM GOALS: Target date: 11/03/22 Colleen Obrien will be independent with her day 1 home exercise program Baseline: Started 07/20/2022 Goal status: Met 08/19/2022   2.  Colleen Obrien will safely transition to a cane without an increase in her right knee edema Baseline: Wheeled walker Goal status:  ONGOING 10/13/22 back on RW due to pain levels/deconditioning      LONG TERM GOALS: Target date: 11/24/22   Colleen Obrien will report right knee pain consistently less than 3 out of 10 on the numeric pain rating scale Baseline: Can be as high as 6 out of 10 Goal status: ONGOING 10/13/22   2.  Improve FOTO to 60 Baseline: 47, risk adjusted 45 Goal status:  ONGOING 10/13/22   3.  Improve bilateral quadriceps and hip abductor strength as assessed by MMT Baseline: See above Goal status: ONGOING 10/13/22   4.  Colleen Obrien will be able to safely transition off any assistive device without increased right knee edema Baseline: Wheeled walker Goal status: ONGOING 10/13/22   5.  Colleen Obrien will be independent with her long-term home exercise program at discharge Baseline: Started 07/20/2022 Goal status: ONGOING 10/13/22     PLAN:   PT FREQUENCY: 1-2 times per week   PT DURATION: 6 weeks    PLANNED INTERVENTIONS: Therapeutic exercises, Therapeutic activity, Neuromuscular re-education, Balance training, Gait training, Patient/Family education, Self Care, Stair training, Electrical stimulation, Cryotherapy, Vasopneumatic device, and Manual therapy   PLAN FOR NEXT SESSION:  continue to progress as able and tolerated, what did MD say about her hip? Did she bring script for shoulder w/u?     Nedra Hai, PT, DPT 10/19/22 9:30 AM

## 2022-10-20 ENCOUNTER — Ambulatory Visit: Payer: Medicare PPO | Admitting: Orthopaedic Surgery

## 2022-10-20 ENCOUNTER — Ambulatory Visit (HOSPITAL_BASED_OUTPATIENT_CLINIC_OR_DEPARTMENT_OTHER): Payer: Medicare PPO

## 2022-10-20 ENCOUNTER — Ambulatory Visit (INDEPENDENT_AMBULATORY_CARE_PROVIDER_SITE_OTHER): Payer: Medicare PPO | Admitting: Physician Assistant

## 2022-10-20 ENCOUNTER — Encounter (HOSPITAL_BASED_OUTPATIENT_CLINIC_OR_DEPARTMENT_OTHER): Payer: Self-pay | Admitting: Physician Assistant

## 2022-10-20 DIAGNOSIS — Z96642 Presence of left artificial hip joint: Secondary | ICD-10-CM | POA: Diagnosis not present

## 2022-10-20 DIAGNOSIS — M25552 Pain in left hip: Secondary | ICD-10-CM | POA: Diagnosis not present

## 2022-10-20 DIAGNOSIS — M7062 Trochanteric bursitis, left hip: Secondary | ICD-10-CM

## 2022-10-20 NOTE — Progress Notes (Signed)
Office Visit Note   Patient: Colleen Obrien           Date of Birth: March 17, 1955           MRN: [id number] Visit Date: 10/20/2022              Requested by: [name], MD [phone] [place],  [place] [address] PCP: [name], MD  Chief Complaint  Patient presents with   Left Hip - Pain      HPI: Colleen Obrien is a pleasant 67 year old woman who normally is followed by Dr. Roda Shutters.  She is status post left total hip arthroplasty in 2021.  She is also status post IM rodding of a right tibial proximal nonunion fracture by Dr. Carola Frost.  She had that earlier this year.  She has been in physical therapy for that.  She comes in today with a 2-1/2-week history of isolated left lateral hip pain.  Denies any particular injury but says she turned 1 way a day and had a sharp pain and since then she has had soreness over the lateral side of her hip.  She denies any fever, chills,.  She denies any loss of bowel or bladder control.  She denies any back pain.  She denies any radicular symptoms.  She denies any pain in her groin.  Rates the pain as moderate.  She is using a walker but this is mostly because of her recovery from her right lower extremity.  She cannot take anti-inflammatories because she has kidney disease and is awaiting a transplant.  Assessment & Plan: Visit Diagnoses:  1. Status post total replacement of left hip   2. Trochanteric bursitis, left hip     Plan: Findings consistent with trochanteric bursitis of the left hip.  Talked about options she does not want to have an injection today.  Unfortunately she cannot take anti-inflammatories orally.  I do think it is okay if she uses a little topical Voltaren gel over the lateral hip.  She also can try some physical therapy.  She is currently doing PT and certainly adding the left hip to it should not be a problem.  Done that today.  Can follow-up with Mardella Layman or Dr. Roda Shutters in a couple weeks  Follow-Up Instructions:  Return if symptoms worsen or fail to improve.   Ortho Exam  Patient is alert, oriented, no adenopathy, well-dressed, normal affect, normal respiratory effort. Examination of her left lower extremity no redness no erythema no tenderness or pain over her back.  No tenderness in her groin no tenderness with logrolling of her left hip.  She has good lower extremity strength without any radicular findings.  She is focally tender over the trochanteric bursa on the left.  This pain does not radiate.  Negative Denna Haggard' sign compartments are soft and nontender  Imaging: No results found. No images are attached to the encounter.  Labs: Lab Results  Component Value Date   HGBA1C 5.4 08/23/2016   CRP 2.9 08/23/2016   LABURIC 5.8 09/06/2022   LABURIC 5.7 08/09/2022   LABURIC 6.4 07/12/2022   REPTSTATUS 09/23/2022 FINAL 09/18/2022   REPTSTATUS 09/23/2022 FINAL 09/18/2022   CULT  09/18/2022    NO GROWTH 5 DAYS Performed at [facility], [address], [place], [place] [address]    CULT  09/18/2022    NO GROWTH 5 DAYS Performed at [facility], [address], [place], [place] [address]    [facility] ESCHERICHIA  COLI 12/18/2012     Lab Results  Component Value Date   ALBUMIN 3.5 09/18/2022   ALBUMIN 3.5 09/06/2022   ALBUMIN 3.7 08/09/2022   PREALBUMIN 24.6 08/21/2019    No results found for: "MG" Lab Results  Component Value Date   VD25OH 62.23 09/19/2022   VD25OH 47.49 05/03/2022   VD25OH 19 (L) 12/28/2021    Lab Results  Component Value Date   PREALBUMIN 24.6 08/21/2019      Latest Ref Rng & Units 09/21/2022    4:33 AM 09/20/2022    2:45 AM 09/19/2022    3:58 AM  CBC EXTENDED  WBC 4.0 - 10.5 K/uL 8.6  9.6  11.3   RBC 3.87 - 5.11 MIL/uL 3.26  3.30  3.22   Hemoglobin 12.0 - 15.0 g/dL 9.3  9.6  9.3   HCT 16.1 - 46.0 % 29.3  29.4  29.3   Platelets 150 - 400 K/uL 256  229  215      There is no height or weight on file to calculate BMI.  Orders:  Orders Placed  This Encounter  Procedures   DG Hip Unilat W OR W/O Pelvis 2-3 Views Left   Ambulatory referral to Physical Therapy   No orders of the defined types were placed in this encounter.    Procedures: No procedures performed  Clinical Data: No additional findings.  ROS:  All other systems negative, except as noted in the HPI. Review of Systems  Objective: Vital Signs: There were no vitals taken for this visit.  Specialty Comments:  No specialty comments available.  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 05/03/2022   Anemia of chronic renal failure 03/08/2021   Chronic renal disease, stage IV (HCC) 03/08/2021   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   AVN (avascular necrosis of bone) (HCC) 07/08/2019   Diverticulosis of colon without hemorrhage 07/08/2019   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   Tremor 03/02/2017   Shaking 08/23/2016   Excessive daytime sleepiness 08/23/2016   Obstructive sleep apnea 08/23/2016   Past Medical History:  Diagnosis Date   Anemia in chronic kidney disease (CKD)    Arthritis    Asthma    Blood dyscrasia    systemic lupus   Chronic renal disease, stage 5, glomerular filtration rate less than or equal to 15 mL/min/1.73 square meter (HCC)    Dyspnea    GERD (gastroesophageal reflux disease)    Gout    Hypertension    Lupus (HCC)    Narcolepsy    Osteoporosis 05/05/2022   Pneumonia    x 1   Sleep apnea    tries to use CPAP nightly as of 04/29/22   Walker as Engineer, water dependent    when going  outside home    Family History  Problem Relation Age of Onset   Hypertension Mother    Diabetes Mother    Dementia Father    Hypertension Father    Hyperlipidemia Father    Diabetes Paternal Grandmother    Alcohol  abuse Paternal Grandfather     Past Surgical History:  Procedure Laterality Date   CESAREAN SECTION     x 2   COLONOSCOPY     ganglion cyst removed Right    arm   TIBIA IM NAIL INSERTION Right 05/03/2022   Procedure: INTRAMEDULLARY (IM) NAIL TIBIAL;  Surgeon: Myrene Galas, MD;  Location: [facility] OR;  Service: Orthopedics;  Laterality: Right;   TOTAL HIP ARTHROPLASTY Left 08/26/2019   Procedure: LEFT TOTAL HIP ARTHROPLASTY ANTERIOR APPROACH;  Surgeon: Tarry Kos, MD;  Location: [facility] OR;  Service: Orthopedics;  Laterality: Left;   TUBAL LIGATION     Social History   Occupational History   Occupation: Mining engineer  Tobacco Use   Smoking status: Never   Smokeless tobacco: Never  Vaping Use   Vaping status: Never Used  Substance and Sexual Activity   Alcohol use: No   Drug use: No   Sexual activity: Not on file

## 2022-10-24 DIAGNOSIS — Z7682 Awaiting organ transplant status: Secondary | ICD-10-CM | POA: Diagnosis not present

## 2022-10-28 ENCOUNTER — Ambulatory Visit (INDEPENDENT_AMBULATORY_CARE_PROVIDER_SITE_OTHER): Payer: Medicare PPO | Admitting: Physical Therapy

## 2022-10-28 ENCOUNTER — Encounter: Payer: Self-pay | Admitting: Physical Therapy

## 2022-10-28 DIAGNOSIS — R600 Localized edema: Secondary | ICD-10-CM | POA: Diagnosis not present

## 2022-10-28 DIAGNOSIS — M6281 Muscle weakness (generalized): Secondary | ICD-10-CM | POA: Diagnosis not present

## 2022-10-28 DIAGNOSIS — G8929 Other chronic pain: Secondary | ICD-10-CM | POA: Diagnosis not present

## 2022-10-28 DIAGNOSIS — M79661 Pain in right lower leg: Secondary | ICD-10-CM | POA: Diagnosis not present

## 2022-10-28 DIAGNOSIS — M25561 Pain in right knee: Secondary | ICD-10-CM

## 2022-10-28 DIAGNOSIS — M25511 Pain in right shoulder: Secondary | ICD-10-CM

## 2022-10-28 DIAGNOSIS — M25552 Pain in left hip: Secondary | ICD-10-CM | POA: Diagnosis not present

## 2022-10-28 DIAGNOSIS — R262 Difficulty in walking, not elsewhere classified: Secondary | ICD-10-CM

## 2022-10-28 NOTE — Therapy (Signed)
OUTPATIENT PHYSICAL THERAPY SHOULDER EVAL     Patient Name: Colleen Obrien MRN: [id number] DOB:[birth year], 67 y.o., female Today's Date: 07/20/2022  Referring diagnosis? Tibia fracture, 10/28/22 R shoulder rotator cuff tendonitis  Diagnosis  M79.661 (ICD-10-CM) - Pain in right lower leg   10/28/22- Chronic R shoulder pain M25.511, G89.29 Treatment diagnosis? (if different than referring diagnosis) R26.2   M62.81   M25.561   R60.0 M.25.511 What was this (referring dx) caused by? [x]  Surgery [x]  Fall [x]  Ongoing issue- R shoulder pain []  Arthritis [x]  Other: ___tibial plateau fracture_________   Laterality: [x]  Rt []  Lt []  Both   Check all possible CPT codes:             *CHOOSE 10 OR LESS*                          []  97110 (Therapeutic Exercise)             []  92507 (SLP Treatment)  []  97112 (Neuro Re-ed)                           []  92526 (Swallowing Treatment)             []  88416 (Gait Training)                           []  [id number] (Cognitive Training, 1st 15 minutes) []  97140 (Manual Therapy)                                []  97130 (Cognitive Training, each add'l 15 minutes)   []  97164 (Re-evaluation)                              []  Other, List CPT Code ____________  []  97530 (Therapeutic Activities)                                    []  97535 (Self Care)                      [x]  All codes above (97110 - 97535)            []  97012 (Mechanical Traction)            []  97014 (E-stim Unattended)            []  97032 (E-stim manual)            []  97033 (Ionto)            []  97035 (Ultrasound) []  97750 (Physical Performance Training) []  [id number] (Aquatic Therapy) [x]  97016 (Vasopneumatic Device) []  [id number] (Paraffin) []  97034 (Contrast Bath) []  97597 (Wound Care 1st 20 sq cm) []  97598 (Wound Care each add'l 20 sq cm) []  97760 (Orthotic Fabrication, Fitting, Training Initial) []  [id number] (Prosthetic Management and Training Initial) []  [id number] (Orthotic or Prosthetic Training/  Modification Subsequent)     PT End of Session - 10/28/22 0914     Visit Number 13    Number of Visits 23    Date for PT Re-Evaluation 11/24/22    Authorization Type [employer]    Authorization Time Period new auth 10/13/22 to 11/24/22    PT Start  Time 0847    PT Stop Time 0926    PT Time Calculation (min) 39 min    Activity Tolerance Patient tolerated treatment well    Behavior During Therapy WFL for tasks assessed/performed                       Past Medical History:  Diagnosis Date   Anemia in chronic kidney disease (CKD)     Arthritis     Asthma     Blood dyscrasia      systemic lupus   Chronic kidney disease (CKD), active medical management without dialysis, stage 4 (severe) (HCC)     Dyspnea     GERD (gastroesophageal reflux disease)     Gout     Hypertension     Lupus (HCC)     Narcolepsy     Osteoporosis 05/05/2022   Pneumonia      x 1   Sleep apnea      tries to use CPAP nightly as of 04/29/22   Walker as Set designer dependent      when going outside home         Past Surgical History:  Procedure Laterality Date   CESAREAN SECTION        x 2   COLONOSCOPY       ganglion cyst removed Right      arm   TIBIA IM NAIL INSERTION Right 05/03/2022    Procedure: INTRAMEDULLARY (IM) NAIL TIBIAL;  Surgeon: Myrene Galas, MD;  Location: [facility] OR;  Service: Orthopedics;  Laterality: Right;   TOTAL HIP ARTHROPLASTY Left 08/26/2019    Procedure: LEFT TOTAL HIP ARTHROPLASTY ANTERIOR APPROACH;  Surgeon: Tarry Kos, MD;  Location: [facility] OR;  Service: Orthopedics;  Laterality: Left;   TUBAL LIGATION            Patient Active Problem List    Diagnosis Date Noted   Osteoporosis 05/05/2022   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 05/03/2022   Anemia of chronic renal failure 03/08/2021   Chronic renal disease, stage IV (HCC) 03/08/2021   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   AVN (avascular necrosis of bone) (HCC)  07/08/2019   Diverticulosis of colon without hemorrhage 07/08/2019   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  Surgery in 05/03/2022.  R shoulder January 2024   SUBJECTIVE:    SUBJECTIVE STATEMENT:  I brought the script for my shoulder. I'd really like you to eval it and work on it bc I'm having sharp pains. This wet weather is getting me. R shoulder pain started in January this year falling off a bed in hotel    PERTINENT HISTORY: CKD, arthritis, asthma, HTN, Lt THA  PAIN:  NPRS scale:3/10 R LE from knee down Pain location: R knee/leg; R shoulder 4-5/10 when it hits  Pain description: burning feeling R LE; R shoulder sharp pain Aggravating factors: movement  RLE, R shoulder  laying on it and  Relieving factors: rest and ice RLE; R shoulder stretching it out    PRECAUTIONS: None   WEIGHT BEARING RESTRICTIONS: No   FALLS:  Has patient fallen in last 6 months? 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 No immediate postoperative  complication.   PATIENT SURVEYS:  08/17/2022:  FOTO 57; 10/13/22 42.2  07/20/2022 Eval FOTO 47 (risk-adjusted 45, Goal 60 in 15 visits)   COGNITION: 07/20/2022 Overall cognitive status: Within functional limits for tasks assessed                         SENSATION: 07/20/2022 Burning of inferior and medial knee, occasional shocking to lateral 2 toes from the medial ankle   EDEMA:  07/20/2022 Noted and not assssed secondary to starting 7 minutes late  PALPATION  10/28/22- anterior R shoulder TTP, no tenderness noted R bicep although she does report ongoing sharp pains there      LOWER EXTREMITY ROM:   Active ROM Left/Right 07/20/2022    Hip flexion      Hip extension      Hip abduction      Hip adduction      Hip internal rotation      Hip external rotation      Knee flexion 130/132    Knee extension 0/0    Ankle dorsiflexion      Ankle plantarflexion      Ankle inversion      Ankle eversion       (Blank rows = not tested)   Active ROM L shoulder  R shoulder   Flexion  132* 82* pain anterior shoulder  ABD 105* 105* pain anterior shoulder   FER Nape of neck Nape of neck  FIR L1  L3    LOWER EXTREMITY STRENGTH:   MMT Left/Right 07/20/2022  Right 08/17/2022 Left 08/17/2022 Right 08/30/2022 Left/Right in pounds 09/01/2022 Left/Right 10/13/22  Hip flexion    5/5 5/5   4/4  Hip extension          Hip abduction 4-/4-      3/2+  Hip adduction          Hip internal rotation          Hip external rotation          Knee flexion    5/5 5/5   4/4  Knee extension 4/4- 4/5 4+/5 4+/5 40.5/24.8 4+/4  Ankle dorsiflexion          Ankle plantarflexion          Ankle inversion          Ankle eversion           (Blank rows = not tested)  MMT  Left shoulder   Right shoulder   Shoulder flexion 4 4-  Shoulder ABD 4  4-  Shoulder ER 4 4-  Shoulder IR  4+ 4       GAIT: 09/05/2022:  Ambulation into and in clinic independently.   08/10/2022:   SPC  use in clinic (in Lt hand) with  variable step to and step through gait pattern.   07/20/2022 Distance walked: 100 feet Assistive device utilized: Environmental consultant - 2 wheeled Level of assistance: Modified independence Comments: Limp on the right side due to pain, quadriceps and hip abductors weakness                     TODAY'S TREATMENT 10/2722  Shoulder eval + care planning + HEP per pt request (see above for objectives)  TherEx  UBE L4 x6 minutes backwards only  Supine shoulder flexion stretch 12x3 second holds Scapular retractions red TB x10 2 second holds Backward shoulder rolls x20     TODAY'S TREATMENT 10/19/22   TherEx  Scifit bike L5x3 min 3 seconds, then 2 min 30 seconds at L4 Shuttle LE press 56# 2x15 BLEs, single leg 31# 2x12 B Bridges x12 Supine clams red TB x12  Seated TA set + hip flexion x12 B Hip ABD into red TB + STS x12   TODAY'S TREATMENT 10/13/22  FOTO/MMT/goal review   TherEx  Nustep L4 x6 minutes BLEs only Standing hip hikes x10 B with BUE support  Mini-squats in front of chair x6 mod cues for form         TODAY'S TREATMENT:                                                              DATE 09/13/2022 TherEx Nustep Lvl 6 6 mins UE/LE Leg press double leg 87 lbs x 15, single leg 2 x 10 bilateral 43 lbs   Neuro Re-ed Tandem stance 1 min x 1 bilateral on foam c occasional HHA required on bar  SLS with black mat corner touching contralateral leg x 8 each corner, performed bilaterally  Fitter rocker board fwd/back light touch x 20 with SBA and occasional to moderate HHA on bar   TODAY'S TREATMENT:                                                              DATE 09/05/2022 TherEx Recumbent bike seat 3 lvl 1-2 8 mins Leg press double leg 87 lbs x 15, single leg 2 x 10 bilateral 43 lbs  Incline gastroc stretch 30 sec x 3 bilateral  Stair navigation 4 step up/down.  Due to Lt knee complaints, Rt leg showed as more comfortable to WB.    Neuro Re-ed Tandem stance 1 min x 2  bilateral on foam c occasional HHA required on bar  Alternating PF/DF on foam with occasional HHA x 20 for ankle strategy improvements Alternating toe tapping 4 inch step forward 2 x 10 bilateral with CGA  TODAY'S TREATMENT:                                                              DATE: 09/01/2022 Recumbent bike Seat 3 for  5 minutes, Level 3 Seated straight leg raises 3 sets of 5 with 1# slow eccentrics  Upper heel cords stretch 4 x 20 seconds Lower heel cords stretch 4 x 20 seconds Standing heel raises 10 x 3 seconds  Functional Activities: Double Leg Press 100# 2 sets of 10 slow eccentrics Single Leg Press 43# 10 x slow eccentrics      PATIENT EDUCATION:  07/20/2022 Education details: Reviewed exam findings and home exercise program Person educated: Patient Education method: Explanation, Demonstration, Tactile cues, Verbal cues, and Handouts Education comprehension: verbalized understanding, returned demonstration, verbal cues required, tactile cues required, and needs further education   HOME EXERCISE PROGRAM:  Access Code: [email or web address]  URL: [email or web address] Date: 10/28/2022 Prepared by: Nedra Hai  Exercises - Supine Quadricep Sets  - 3-5 x daily - 7 x weekly - 2-3 sets - 10 reps - 5 second hold - Clam with Resistance  - 3-5 x daily - 7 x weekly - 1-2 sets - 10 reps - 3 seconds hold - Small Range Straight Leg Raise  - 2 x daily - 7 x weekly - 3-5 sets - 5 reps - 3 seconds hold - Sidelying Hip Abduction  - 1 x daily - 7 x weekly - 1-2 sets - 10 reps - 5 seconds hold - Heel Toe Raises with Counter Support  - 3-5 x daily - 7 x weekly - 1 sets - 10 reps - 3 seconds hold - Tandem Stance  - 1 x daily - 7 x weekly - 1 sets - 4-5 reps - 20 second hold - Standing Gastroc Stretch  - 2 x daily - 7 x weekly - 1 sets - 5 reps - 20 seconds hold - Supine Shoulder Flexion Extension AAROM with Dowel  - 2 x daily - 7 x weekly - 1 sets - 10 reps - 2 seconds  hold -  Scapular Retraction with Resistance  - 2 x daily - 7 x weekly - 1 sets - 10 reps - 2 seconds  hold - Seated Backward Shoulder Rolls  - 2 x daily - 7 x weekly - 1 sets - 20 reps   ASSESSMENT:   CLINICAL IMPRESSION:   Colleen Obrien arrives today doing OK, feeling some extra joint pain from the weather. She brought shoulder pain referral from MD- educated that she will get best results in both body areas if we complete care on LEs first then move to the shoulder but she continued to request that we eval and treat shoulder today. Objectives as above. Will add shoulder care into [facility] and treat as appropriate moving forward.    OBJECTIVE IMPAIRMENTS: Abnormal gait, decreased activity tolerance, decreased balance, decreased endurance, decreased knowledge of condition, difficulty walking, decreased strength, increased edema, impaired perceived functional ability, and pain.    ACTIVITY LIMITATIONS: carrying, lifting, bending, sitting, standing, squatting, stairs, transfers, and locomotion level   PARTICIPATION LIMITATIONS: cleaning, driving, shopping, and community activity   PERSONAL FACTORS: CKD, arthritis, asthma, HTN, Lt THA are also affecting patient's functional outcome.    REHAB POTENTIAL: Good   CLINICAL DECISION MAKING: Stable/uncomplicated   EVALUATION COMPLEXITY: Low     GOALS: Goals reviewed with patient? Yes   SHORT TERM GOALS: Target date: 11/18/22 Colleen Obrien will be independent with her day 1 home exercise program Baseline: Started 07/20/2022 Goal status: Met 08/19/2022   2.  Colleen Obrien will safely transition to a cane without an increase in her right knee edema Baseline: Wheeled walker Goal status:  ONGOING 10/13/22 back  on RW due to pain levels/deconditioning      LONG TERM GOALS: Target date: 12/09/22   Colleen Obrien will report right knee pain consistently less than 3 out of 10 on the numeric pain rating scale Baseline: Can be as high as 6 out of 10 Goal status: ONGOING  10/13/22   2.  Improve FOTO to 60 Baseline: 47, risk adjusted 45 Goal status: ONGOING 10/13/22   3.  Improve bilateral quadriceps and hip abductor strength as assessed by MMT Baseline: See above Goal status: ONGOING 10/13/22   4.  Colleen Obrien will be able to safely transition off any assistive device without increased right knee edema Baseline: Wheeled walker Goal status: ONGOING 10/13/22   5.  Colleen Obrien will be independent with her long-term home exercise program at discharge Baseline: Started 07/20/2022 Goal status: ONGOING 10/13/22  6. R shoulder pain to be no more than 3/10 at worst during functional task performance  7. Will demonstrate improved functional strength and ROM in R shoulder as evidenced by ability to perform all functional ADL and self care tasks without increase from resting pain levels  8. B shoulder strength to have improved by 1 grade MMT      PLAN:   PT FREQUENCY: 1-2 times per week   PT DURATION: 6 weeks    PLANNED INTERVENTIONS: Therapeutic exercises, Therapeutic activity, Neuromuscular re-education, Balance training, Gait training, Patient/Family education, Self Care, Stair training, Electrical stimulation, Cryotherapy, Vasopneumatic device, and Manual therapy   PLAN FOR NEXT SESSION:  continue to progress as able and tolerated, what did MD say about her hip? Did insurance clear [name] to work on shoulder/add to [place]?     Nedra Hai, PT, DPT 10/28/22 9:38 AM

## 2022-10-31 ENCOUNTER — Ambulatory Visit (INDEPENDENT_AMBULATORY_CARE_PROVIDER_SITE_OTHER): Payer: Medicare PPO | Admitting: Physical Therapy

## 2022-10-31 ENCOUNTER — Encounter: Payer: Self-pay | Admitting: Physical Therapy

## 2022-10-31 DIAGNOSIS — M6281 Muscle weakness (generalized): Secondary | ICD-10-CM

## 2022-10-31 DIAGNOSIS — R600 Localized edema: Secondary | ICD-10-CM | POA: Diagnosis not present

## 2022-10-31 DIAGNOSIS — M25561 Pain in right knee: Secondary | ICD-10-CM

## 2022-10-31 DIAGNOSIS — G8929 Other chronic pain: Secondary | ICD-10-CM

## 2022-10-31 DIAGNOSIS — R262 Difficulty in walking, not elsewhere classified: Secondary | ICD-10-CM | POA: Diagnosis not present

## 2022-10-31 NOTE — Therapy (Signed)
OUTPATIENT PHYSICAL THERAPY TREATMENT     Patient Name: Colleen Obrien MRN: [id number] DOB:October 24, 1955, 67 y.o., female Today's Date: 07/20/2022   PT End of Session - 10/31/22 0900     Visit Number 14    Number of Visits 23    Date for PT Re-Evaluation 11/24/22    Authorization Type HUMANA    Authorization Time Period new auth 10/13/22 to 11/24/22    PT Start Time 0856    PT Stop Time 0928    PT Time Calculation (min) 32 min    Activity Tolerance Patient tolerated treatment well    Behavior During Therapy WFL for tasks assessed/performed                      Past Medical History:  Diagnosis Date   Anemia in chronic kidney disease (CKD)     Arthritis     Asthma     Blood dyscrasia      systemic lupus   Chronic kidney disease (CKD), active medical management without dialysis, stage 4 (severe) (HCC)     Dyspnea     GERD (gastroesophageal reflux disease)     Gout     Hypertension     Lupus (HCC)     Narcolepsy     Osteoporosis 05/05/2022   Pneumonia      x 1   Sleep apnea      tries to use CPAP nightly as of 04/29/22   Walker as Set designer dependent      when going outside home         Past Surgical History:  Procedure Laterality Date   CESAREAN SECTION        x 2   COLONOSCOPY       ganglion cyst removed Right      arm   TIBIA IM NAIL INSERTION Right 05/03/2022    Procedure: INTRAMEDULLARY (IM) NAIL TIBIAL;  Surgeon: Myrene Galas, MD;  Location: [facility] OR;  Service: Orthopedics;  Laterality: Right;   TOTAL HIP ARTHROPLASTY Left 08/26/2019    Procedure: LEFT TOTAL HIP ARTHROPLASTY ANTERIOR APPROACH;  Surgeon: Tarry Kos, MD;  Location: [facility] OR;  Service: Orthopedics;  Laterality: Left;   TUBAL LIGATION            Patient Active Problem List    Diagnosis Date Noted   Osteoporosis 05/05/2022   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 05/03/2022   Anemia of chronic renal failure 03/08/2021   Chronic renal  disease, stage IV (HCC) 03/08/2021   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   AVN (avascular necrosis of bone) (HCC) 07/08/2019   Diverticulosis of colon without hemorrhage 07/08/2019   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  Surgery in 05/03/2022.  R shoulder January 2024   SUBJECTIVE:    SUBJECTIVE STATEMENT: She did some her shoulder exercises. Her arm hurt this weekend similar to whole week.  Her leg is  better at times with pain in right calf.    PERTINENT HISTORY: CKD, arthritis, asthma, HTN, Lt THA  PAIN:  NPRS scale:  2/10 R LE from knee down Pain location: R knee/leg; R shoulder 3.5/10 since last PT session Pain description: burning feeling R LE; R shoulder sharp pain Aggravating factors: movement  RLE, R shoulder laying on it and  Relieving factors: rest and ice RLE; R shoulder stretching it out    PRECAUTIONS: None   WEIGHT BEARING RESTRICTIONS: No   FALLS:  Has patient fallen in last 6 months? 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 No immediate postoperative complication.   PATIENT SURVEYS:  08/17/2022:  FOTO 57; 10/13/22 42.2  07/20/2022 Eval FOTO 47 (risk-adjusted 45, Goal 60 in 15 visits)   COGNITION: 07/20/2022 Overall cognitive status: Within functional limits for tasks assessed                         SENSATION: 07/20/2022 Burning of inferior and medial knee, occasional shocking to lateral 2 toes from the medial ankle   EDEMA:  07/20/2022 Noted and not assssed secondary to starting 7 minutes late  PALPATION  10/28/22- anterior R shoulder TTP, no tenderness noted R bicep although she does report ongoing sharp pains there      LOWER EXTREMITY ROM:   Active ROM Left/Right 07/20/2022    Hip flexion      Hip extension      Hip abduction      Hip adduction      Hip internal rotation      Hip external rotation      Knee flexion 130/132    Knee extension 0/0    Ankle dorsiflexion      Ankle plantarflexion      Ankle inversion      Ankle eversion       (Blank rows = not tested)   Active ROM L shoulder  R shoulder   Flexion  132* 82* pain anterior shoulder  ABD 105* 105* pain anterior shoulder   FER Nape of neck Nape of neck  FIR L1  L3    LOWER EXTREMITY STRENGTH:   MMT Left/Right 07/20/2022  Right 08/17/2022 Left 08/17/2022 Right 08/30/2022 Left/Right in pounds 09/01/2022 Left/Right 10/13/22  Hip flexion    5/5 5/5   4/4  Hip extension          Hip abduction 4-/4-      3/2+  Hip adduction          Hip internal rotation          Hip external rotation          Knee flexion    5/5 5/5   4/4  Knee extension 4/4- 4/5 4+/5 4+/5 40.5/24.8 4+/4  Ankle dorsiflexion          Ankle plantarflexion          Ankle inversion          Ankle eversion           (  Blank rows = not tested)  MMT  Left shoulder   Right shoulder   Shoulder flexion 4 4-  Shoulder ABD 4  4-  Shoulder ER 4 4-  Shoulder IR  4+ 4       GAIT: 09/05/2022:  Ambulation into and in clinic  independently.   08/10/2022:   SPC use in clinic (in Lt hand) with variable step to and step through gait pattern.   07/20/2022 Distance walked: 100 feet Assistive device utilized: Environmental consultant - 2 wheeled Level of assistance: Modified independence Comments: Limp on the right side due to pain, quadriceps and hip abductors weakness                     TODAY'S TREATMENT 10/31/2022: Therapeutic Exercise: SciFit bike seat 8 level 1 with BLEs & BUEs 8 min Supine BUE dowel rod flexion 10 reps Supine SAQ RLE 5# 10 reps 2 sets Supine chest press dowel rod 10 reps Supine RUE shoulder AROM abduction 10 reps Supine heel slide with RLE on 18" ball with strap assist 10 reps  Standing UE ranger RUE flexion 10 reps Standing gastroc stretch on step heel depression single LE BLEs 30 sec hold 2 reps   TREATMENT 10/2722  Shoulder eval + care planning + HEP per pt request (see above for objectives)  TherEx  UBE L4 x6 minutes backwards only  Supine shoulder flexion stretch 12x3 second holds Scapular retractions red TB x10 2 second holds Backward shoulder rolls x20    TREATMENT 10/19/22  TherEx  Scifit bike L5x3 min 3 seconds, then 2 min 30 seconds at L4 Shuttle LE press 56# 2x15 BLEs, single leg 31# 2x12 B Bridges x12 Supine clams red TB x12  Seated TA set + hip flexion x12 B Hip ABD into red TB + STS x12      PATIENT EDUCATION:  07/20/2022 Education details: Reviewed exam findings and home exercise program Person educated: Patient Education method: Explanation, Demonstration, Tactile cues, Verbal cues, and Handouts Education comprehension: verbalized understanding, returned demonstration, verbal cues required, tactile cues required, and needs further education   HOME EXERCISE PROGRAM:  Access Code: [email or web address]  URL: [email or web address] Date: 10/28/2022 Prepared by: Nedra Hai  Exercises - Supine Quadricep Sets  - 3-5 x daily - 7 x weekly - 2-3 sets - 10 reps - 5  second hold - Clam with Resistance  - 3-5 x daily - 7 x weekly - 1-2 sets - 10 reps - 3 seconds hold - Small Range Straight Leg Raise  - 2 x daily - 7 x weekly - 3-5 sets - 5 reps - 3 seconds hold - Sidelying Hip Abduction  - 1 x daily - 7 x weekly - 1-2 sets - 10 reps - 5 seconds hold - Heel Toe Raises with Counter Support  - 3-5 x daily - 7 x weekly - 1 sets - 10 reps - 3 seconds hold - Tandem Stance  - 1 x daily - 7 x weekly - 1 sets - 4-5 reps - 20 second hold - Standing Gastroc Stretch  - 2 x daily - 7 x weekly - 1 sets - 5 reps - 20 seconds hold - Supine Shoulder Flexion Extension AAROM with Dowel  - 2 x daily - 7 x weekly - 1 sets - 10 reps - 2 seconds  hold - Scapular Retraction with Resistance  - 2 x daily - 7 x weekly - 1 sets - 10 reps - 2 seconds  hold -  Seated Backward Shoulder Rolls  - 2 x daily - 7 x weekly - 1 sets - 20 reps   ASSESSMENT:   CLINICAL IMPRESSION: Patient arrived late so PT session limited.  PT alternated UE & LE exercises which seemed to improve pt tolerance. Pt would benefit from going to [facility] 1-2 x/wk in addition to PT for light exercises.     OBJECTIVE IMPAIRMENTS: Abnormal gait, decreased activity tolerance, decreased balance, decreased endurance, decreased knowledge of condition, difficulty walking, decreased strength, increased edema, impaired perceived functional ability, and pain.    ACTIVITY LIMITATIONS: carrying, lifting, bending, sitting, standing, squatting, stairs, transfers, and locomotion level   PARTICIPATION LIMITATIONS: cleaning, driving, shopping, and community activity   PERSONAL FACTORS: CKD, arthritis, asthma, HTN, Lt THA are also affecting patient's functional outcome.    REHAB POTENTIAL: Good   CLINICAL DECISION MAKING: Stable/uncomplicated   EVALUATION COMPLEXITY: Low     GOALS: Goals reviewed with patient? Yes   SHORT TERM GOALS: Target date: 11/18/22 Zaima will be independent with her day 1 home exercise program Baseline:  Started 07/20/2022 Goal status: Met 08/19/2022   2.  Miri will safely transition to a cane without an increase in her right knee edema Baseline: Wheeled walker Goal status:  ONGOING 10/13/22 back on RW due to pain levels/deconditioning      LONG TERM GOALS: Target date: 12/09/22   Laylarose will report right knee pain consistently less than 3 out of 10 on the numeric pain rating scale Baseline: Can be as high as 6 out of 10 Goal status: ONGOING 10/13/22   2.  Improve FOTO to 60 Baseline: 47, risk adjusted 45 Goal status: ONGOING 10/13/22   3.  Improve bilateral quadriceps and hip abductor strength as assessed by MMT Baseline: See above Goal status: ONGOING 10/13/22   4.  Thula will be able to safely transition off any assistive device without increased right knee edema Baseline: Wheeled walker Goal status: ONGOING 10/13/22   5.  Itzamara will be independent with her long-term home exercise program at discharge Baseline: Started 07/20/2022 Goal status: ONGOING 10/13/22  6. R shoulder pain to be no more than 3/10 at worst during functional task performance  7. Will demonstrate improved functional strength and ROM in R shoulder as evidenced by ability to perform all functional ADL and self care tasks without increase from resting pain levels  8. B shoulder strength to have improved by 1 grade MMT      PLAN:   PT FREQUENCY: 1-2 times per week   PT DURATION: 6 weeks    PLANNED INTERVENTIONS: Therapeutic exercises, Therapeutic activity, Neuromuscular re-education, Balance training, Gait training, Patient/Family education, Self Care, Stair training, Electrical stimulation, Cryotherapy, Vasopneumatic device, and Manual therapy   PLAN FOR NEXT SESSION:  consider alternating UE & LE exercises to improve tolerance.   continue to progress as able and tolerated, what did MD say about her hip?    Vladimir Faster, PT, DPT 10/31/2022, 9:58 AM

## 2022-11-01 ENCOUNTER — Encounter (HOSPITAL_COMMUNITY): Payer: Medicare PPO

## 2022-11-07 ENCOUNTER — Encounter: Payer: Medicare PPO | Admitting: Physical Therapy

## 2022-11-08 ENCOUNTER — Encounter: Payer: Self-pay | Admitting: Rehabilitative and Restorative Service Providers"

## 2022-11-08 ENCOUNTER — Ambulatory Visit (INDEPENDENT_AMBULATORY_CARE_PROVIDER_SITE_OTHER): Payer: Medicare PPO | Admitting: Rehabilitative and Restorative Service Providers"

## 2022-11-08 DIAGNOSIS — R600 Localized edema: Secondary | ICD-10-CM | POA: Diagnosis not present

## 2022-11-08 DIAGNOSIS — M79661 Pain in right lower leg: Secondary | ICD-10-CM | POA: Diagnosis not present

## 2022-11-08 DIAGNOSIS — R262 Difficulty in walking, not elsewhere classified: Secondary | ICD-10-CM | POA: Diagnosis not present

## 2022-11-08 DIAGNOSIS — M6281 Muscle weakness (generalized): Secondary | ICD-10-CM

## 2022-11-08 DIAGNOSIS — G8929 Other chronic pain: Secondary | ICD-10-CM | POA: Diagnosis not present

## 2022-11-08 DIAGNOSIS — M25552 Pain in left hip: Secondary | ICD-10-CM

## 2022-11-08 DIAGNOSIS — M25511 Pain in right shoulder: Secondary | ICD-10-CM | POA: Diagnosis not present

## 2022-11-08 DIAGNOSIS — M25561 Pain in right knee: Secondary | ICD-10-CM

## 2022-11-08 NOTE — Therapy (Signed)
OUTPATIENT PHYSICAL THERAPY TREATMENT     Patient Name: Colleen Obrien MRN: [id number] DOB:1955-04-07, 67 y.o., female Today's Date: 07/20/2022   PT End of Session - 11/08/22 1304     Visit Number 15    Number of Visits 23    Date for PT Re-Evaluation 11/24/22    Authorization Type HUMANA    Authorization Time Period new auth 10/13/22 to 11/24/22    Authorization - Visit Number 5    Authorization - Number of Visits 12    PT Start Time 1304    PT Stop Time 1343    PT Time Calculation (min) 39 min    Activity Tolerance Patient tolerated treatment well    Behavior During Therapy WFL for tasks assessed/performed                       Past Medical History:  Diagnosis Date   Anemia in chronic kidney disease (CKD)     Arthritis     Asthma     Blood dyscrasia      systemic lupus   Chronic kidney disease (CKD), active medical management without dialysis, stage 4 (severe) (HCC)     Dyspnea     GERD (gastroesophageal reflux disease)     Gout     Hypertension     Lupus (HCC)     Narcolepsy     Osteoporosis 05/05/2022   Pneumonia      x 1   Sleep apnea      tries to use CPAP nightly as of 04/29/22   Walker as Set designer dependent      when going outside home         Past Surgical History:  Procedure Laterality Date   CESAREAN SECTION        x 2   COLONOSCOPY       ganglion cyst removed Right      arm   TIBIA IM NAIL INSERTION Right 05/03/2022    Procedure: INTRAMEDULLARY (IM) NAIL TIBIAL;  Surgeon: Myrene Galas, MD;  Location: [facility] OR;  Service: Orthopedics;  Laterality: Right;   TOTAL HIP ARTHROPLASTY Left 08/26/2019    Procedure: LEFT TOTAL HIP ARTHROPLASTY ANTERIOR APPROACH;  Surgeon: Tarry Kos, MD;  Location: [facility] OR;  Service: Orthopedics;  Laterality: Left;   TUBAL LIGATION            Patient Active Problem List    Diagnosis Date Noted   Osteoporosis 05/05/2022   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  05/03/2022   Anemia of chronic renal failure 03/08/2021   Chronic renal disease, stage IV (HCC) 03/08/2021   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   AVN (avascular necrosis of bone) (HCC) 07/08/2019   Diverticulosis of colon without hemorrhage 07/08/2019   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  Surgery in  05/03/2022.  Rt shoulder January 2024   SUBJECTIVE:    SUBJECTIVE STATEMENT: Pt indicated Rt leg complaints primarily upon arrival.    Was up to 5/10 Sunday.     PERTINENT HISTORY: CKD, arthritis, asthma, HTN, Lt THA  PAIN:  NPRS scale: 3/10 Pain location: Rt calf Pain description: burning feeling Rt Aggravating factors: movement  RLE, R tshoulder laying on it and  Relieving factors: rest and ice/hot press   PRECAUTIONS: None   WEIGHT BEARING RESTRICTIONS: No   FALLS:  Has patient fallen in last 6 months? 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 No immediate postoperative complication.   PATIENT SURVEYS:  10/13/22 42.2  08/17/2022:  FOTO 57;  07/20/2022 Eval FOTO 47 (risk-adjusted 45, Goal 60 in 15 visits)   COGNITION: 07/20/2022 Overall cognitive status: Within functional limits for tasks assessed                         SENSATION: 07/20/2022 Burning of inferior and medial knee, occasional shocking to lateral 2 toes from the medial ankle   EDEMA:  07/20/2022 Noted and not assssed secondary to starting 7 minutes late  PALPATION  10/28/22- anterior R shoulder TTP, no tenderness noted R bicep although she does report ongoing sharp pains there      LOWER EXTREMITY ROM:   Active ROM Left/Right 07/20/2022    Hip flexion      Hip extension      Hip abduction      Hip adduction      Hip internal rotation      Hip external rotation      Knee flexion 130/132    Knee extension 0/0    Ankle dorsiflexion      Ankle plantarflexion      Ankle inversion      Ankle eversion       (Blank rows = not tested)   Active ROM Lt shoulder  Rt shoulder    Date: 10/28/2022 Date: 10/28/2022  Flexion  132* 82* pain anterior shoulder  ABD 105* 105* pain anterior shoulder   FER Nape of neck Nape of neck  FIR L1  L3    LOWER EXTREMITY STRENGTH:   MMT Left/Right 07/20/2022  Right 08/17/2022 Left 08/17/2022 Right 08/30/2022 Left/Right in pounds 09/01/2022 Left/Right 10/13/22  Hip flexion    5/5 5/5   4/4  Hip extension          Hip abduction 4-/4-      3/2+  Hip adduction          Hip internal rotation          Hip external rotation          Knee flexion    5/5 5/5   4/4  Knee extension 4/4- 4/5 4+/5 4+/5 40.5/24.8 4+/4  Ankle dorsiflexion          Ankle plantarflexion          Ankle inversion          Ankle eversion           (  Blank rows = not tested)  MMT  Left shoulder   Right shoulder    Date: 10/28/2022 Date: 10/28/2022  Shoulder flexion 4 4-  Shoulder ABD 4  4-  Shoulder ER 4 4-  Shoulder IR  4+ 4        GAIT: 11/08/2022:  Ambulation c reduced gait speed in clinic, performed with cane use in Lt UE.  TUG c SPC:  20.39 seconds, 20.15 seconds   09/05/2022:  Ambulation into and in clinic independently.   08/10/2022:   SPC use in clinic (in Lt hand) with variable step to and step through gait pattern.   07/20/2022 Distance walked: 100 feet Assistive device utilized: Environmental consultant - 2 wheeled Level of assistance: Modified independence Comments: Limp on the right side due to pain, quadriceps and hip abductors weakness                   TODAY'S TREATMENT                                             Date:   11/08/2022 Therex UBE UE/LE seat 7 lvl 3.0 8 mins  Standing green tband rows with feet together 2 x 15 Standing green tband GH ext with feet together 2 x 15 Incline gastroc stretch 30 sec x 5 bilateral   Neuro Re-ed Tandem stance 1 min x 2 bilateral with occasional HHA on bars Alternating heel/toe raises without UE assist x 20 each way  TherActivity (to improve step, transfers, ambulation) TUG x 2 Step up forward light hand assist on bar 4 inch step 2 x 10 bilaterally   TODAY'S TREATMENT                                             Date:   10/31/2022: Therapeutic Exercise: SciFit bike seat 8 level 1 with BLEs & BUEs 8 min Supine BUE dowel rod flexion 10 reps Supine SAQ RLE 5# 10 reps 2 sets Supine chest press dowel rod 10 reps Supine RUE shoulder AROM abduction 10 reps Supine heel slide with RLE on 18" ball with strap assist 10 reps  Standing UE ranger RUE flexion 10 reps Standing gastroc stretch on step heel depression single LE BLEs 30 sec hold 2 reps   TODAY'S TREATMENT                                             Date:  10/28/22  Shoulder eval + care planning + HEP per pt request (see above for objectives)  TherEx UBE L4 x6 minutes backwards only  Supine shoulder flexion stretch 12x3 second holds Scapular retractions red TB x10 2 second holds Backward shoulder rolls  x20  TODAY'S TREATMENT                                             Date:  10/19/22  TherEx Scifit bike L5x3 min 3 seconds, then 2 min 30 seconds at L4 Shuttle LE press 56# 2x15 BLEs, single leg 31# 2x12 B  Bridges x12 Supine clams red TB x12  Seated TA set + hip flexion x12 B Hip ABD into red TB + STS x12    PATIENT EDUCATION:  07/20/2022 Education details: Reviewed exam findings and home exercise program Person educated: Patient Education method: Explanation, Demonstration, Tactile cues, Verbal cues, and Handouts Education comprehension: verbalized understanding, returned demonstration, verbal cues required, tactile cues required, and needs further education   HOME EXERCISE PROGRAM:  Access Code: [email or web address]  URL: [email or web address] Date: 10/28/2022 Prepared by: Nedra Hai  Exercises - Supine Quadricep Sets  - 3-5 x daily - 7 x weekly - 2-3 sets - 10 reps - 5 second hold - Clam with Resistance  - 3-5 x daily - 7 x weekly - 1-2 sets - 10 reps - 3 seconds hold - Small Range Straight Leg Raise  - 2 x daily - 7 x weekly - 3-5 sets - 5 reps - 3 seconds hold - Sidelying Hip Abduction  - 1 x daily - 7 x weekly - 1-2 sets - 10 reps - 5 seconds hold - Heel Toe Raises with Counter Support  - 3-5 x daily - 7 x weekly - 1 sets - 10 reps - 3 seconds hold - Tandem Stance  - 1 x daily - 7 x weekly - 1 sets - 4-5 reps - 20 second hold - Standing Gastroc Stretch  - 2 x daily - 7 x weekly - 1 sets - 5 reps - 20 seconds hold - Supine Shoulder Flexion Extension AAROM with Dowel  - 2 x daily - 7 x weekly - 1 sets - 10 reps - 2 seconds  hold - Scapular Retraction with Resistance  - 2 x daily - 7 x weekly - 1 sets - 10 reps - 2 seconds  hold - Seated Backward Shoulder Rolls  - 2 x daily - 7 x weekly - 1 sets - 20 reps   ASSESSMENT:   CLINICAL IMPRESSION: Focused more on LE symptoms/treatment due to subjective report of it being chief complaint.  TUG testing showed reduced gait speed  and higher amount of time indicated increased fall risk.  Continued PT services indicated to progress strength, balance and functional  mobility.    OBJECTIVE IMPAIRMENTS: Abnormal gait, decreased activity tolerance, decreased balance, decreased endurance, decreased knowledge of condition, difficulty walking, decreased strength, increased edema, impaired perceived functional ability, and pain.    ACTIVITY LIMITATIONS: carrying, lifting, bending, sitting, standing, squatting, stairs, transfers, and locomotion level   PARTICIPATION LIMITATIONS: cleaning, driving, shopping, and community activity   PERSONAL FACTORS: CKD, arthritis, asthma, HTN, Lt THA are also affecting patient's functional outcome.    REHAB POTENTIAL: Good   CLINICAL DECISION MAKING: Stable/uncomplicated   EVALUATION COMPLEXITY: Low     GOALS: Goals reviewed with patient? Yes   SHORT TERM GOALS: Target date: 11/18/22 Taranika will be independent with her day 1 home exercise program Baseline: Started 07/20/2022 Goal status: Met 08/19/2022   2.  Maiza will safely transition to a cane without an increase in her right knee edema Baseline: Wheeled walker Goal status:  ONGOING 10/13/22 back on RW due to pain levels/deconditioning      LONG TERM GOALS: Target date: 12/09/22   Milley will report right knee pain consistently less than 3 out of 10 on the numeric pain rating scale Baseline: Can be as high as 6 out of 10 Goal status: ONGOING 11/08/2022   2.  Improve FOTO to 60 Baseline: 47, risk adjusted 45 Goal status: ONGOING 11/08/2022  3.  Improve bilateral quadriceps and hip abductor strength as assessed by MMT Baseline: See above Goal status: ONGOING 11/08/2022   4.  Aliece will be able to safely transition off any assistive device without increased right knee edema Baseline: Wheeled walker Goal status: ONGOING 11/08/2022   5.  Harshini will be independent with her long-term home exercise program at  discharge Baseline: Started 07/20/2022 Goal status: ONGOING 11/08/2022  6. Rt shoulder pain to be no more than 3/10 at worst during functional task performance  Goal Status: on going 11/08/2022  7. Will demonstrate improved functional strength and ROM in Rt shoulder as evidenced by ability to perform all functional ADL and self care tasks without increase from resting pain levels Goal Status: on going 11/08/2022  8. Bilateral shoulder strength to have improved by 1 grade MMT  Goal Status: on going 11/08/2022     PLAN:   PT FREQUENCY: 1-2 times per week   PT DURATION: 6 weeks    PLANNED INTERVENTIONS: Therapeutic exercises, Therapeutic activity, Neuromuscular re-education, Balance training, Gait training, Patient/Family education, Self Care, Stair training, Electrical stimulation, Cryotherapy, Vasopneumatic device, and Manual therapy   PLAN FOR NEXT SESSION:  Progressive strength/balance improvements for LE.  Shoulder strengthening continued as necessary.  Objective data check on strength/ROM for UE or LE.   Chyrel Masson, PT, DPT, OCS, ATC 11/08/22  1:41 PM

## 2022-11-15 ENCOUNTER — Ambulatory Visit (INDEPENDENT_AMBULATORY_CARE_PROVIDER_SITE_OTHER): Payer: Medicare PPO | Admitting: Physical Therapy

## 2022-11-15 ENCOUNTER — Encounter: Payer: Self-pay | Admitting: Physical Therapy

## 2022-11-15 DIAGNOSIS — R600 Localized edema: Secondary | ICD-10-CM

## 2022-11-15 DIAGNOSIS — M25561 Pain in right knee: Secondary | ICD-10-CM | POA: Diagnosis not present

## 2022-11-15 DIAGNOSIS — M6281 Muscle weakness (generalized): Secondary | ICD-10-CM

## 2022-11-15 DIAGNOSIS — R262 Difficulty in walking, not elsewhere classified: Secondary | ICD-10-CM | POA: Diagnosis not present

## 2022-11-15 DIAGNOSIS — M79661 Pain in right lower leg: Secondary | ICD-10-CM

## 2022-11-15 DIAGNOSIS — M25552 Pain in left hip: Secondary | ICD-10-CM

## 2022-11-15 DIAGNOSIS — G8929 Other chronic pain: Secondary | ICD-10-CM | POA: Diagnosis not present

## 2022-11-15 DIAGNOSIS — M25511 Pain in right shoulder: Secondary | ICD-10-CM | POA: Diagnosis not present

## 2022-11-15 NOTE — Therapy (Signed)
OUTPATIENT PHYSICAL THERAPY TREATMENT     Patient Name: KATILYN DANGELO MRN: [id number] DOB:[birth year], 67 y.o., female Today's Date: 07/20/2022   PT End of Session - 11/15/22 0925     Visit Number 16    Number of Visits 23    Date for PT Re-Evaluation 11/24/22    Authorization Type [employer]    Authorization Time Period new auth 10/13/22 to 11/24/22    Authorization - Number of Visits 12    PT Start Time 0927    PT Stop Time 1008    PT Time Calculation (min) 41 min    Activity Tolerance Patient tolerated treatment well    Behavior During Therapy WFL for tasks assessed/performed                        Past Medical History:  Diagnosis Date   Anemia in chronic kidney disease (CKD)     Arthritis     Asthma     Blood dyscrasia      systemic lupus   Chronic kidney disease (CKD), active medical management without dialysis, stage 4 (severe) (HCC)     Dyspnea     GERD (gastroesophageal reflux disease)     Gout     Hypertension     Lupus (HCC)     Narcolepsy     Osteoporosis 05/05/2022   Pneumonia      x 1   Sleep apnea      tries to use CPAP nightly as of 04/29/22   Walker as Set designer dependent      when going outside home         Past Surgical History:  Procedure Laterality Date   CESAREAN SECTION        x 2   COLONOSCOPY       ganglion cyst removed Right      arm   TIBIA IM NAIL INSERTION Right 05/03/2022    Procedure: INTRAMEDULLARY (IM) NAIL TIBIAL;  Surgeon: Myrene Galas, MD;  Location: [facility] OR;  Service: Orthopedics;  Laterality: Right;   TOTAL HIP ARTHROPLASTY Left 08/26/2019    Procedure: LEFT TOTAL HIP ARTHROPLASTY ANTERIOR APPROACH;  Surgeon: Tarry Kos, MD;  Location: [facility] OR;  Service: Orthopedics;  Laterality: Left;   TUBAL LIGATION            Patient Active Problem List    Diagnosis Date Noted   Osteoporosis 05/05/2022   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 05/03/2022   Anemia of chronic renal  failure 03/08/2021   Chronic renal disease, stage IV (HCC) 03/08/2021   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   AVN (avascular necrosis of bone) (HCC) 07/08/2019   Diverticulosis of colon without hemorrhage 07/08/2019   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  Surgery in 05/03/2022.  Rt shoulder January 2024  SUBJECTIVE:    SUBJECTIVE STATEMENT: RLE has been sore in the calf and toes recently.     PERTINENT HISTORY: CKD, arthritis, asthma, HTN, Lt THA  PAIN:  NPRS scale: 3/10 Pain location: Rt calf Pain description: burning feeling Rt Aggravating factors: movement  RLE, R tshoulder laying on it and  Relieving factors: rest and ice/hot press   PRECAUTIONS: None   WEIGHT BEARING RESTRICTIONS: No   FALLS:  Has patient fallen in last 6 months? 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 No immediate postoperative complication.    PATIENT SURVEYS:  10/13/22 42.2  08/17/2022:  FOTO 57;  07/20/2022 Eval FOTO 47 (risk-adjusted 45, Goal 60 in 15 visits)   COGNITION: 07/20/2022 Overall cognitive status: Within functional limits for tasks assessed                         SENSATION: 07/20/2022 Burning of inferior and medial knee, occasional shocking to lateral 2 toes from the medial ankle   EDEMA:  07/20/2022 Noted and not assssed secondary to starting 7 minutes late  PALPATION  10/28/22- anterior R shoulder TTP, no tenderness noted R bicep although she does report ongoing sharp pains there      LOWER EXTREMITY ROM:   Active ROM Left/Right 07/20/2022    Hip flexion      Hip extension      Hip abduction      Hip adduction      Hip internal rotation      Hip external rotation      Knee flexion 130/132    Knee extension 0/0    Ankle dorsiflexion      Ankle plantarflexion      Ankle inversion      Ankle eversion       (Blank rows = not tested)   Active ROM Lt shoulder  Rt shoulder    Date: 10/28/2022 Date: 10/28/2022  Flexion  132* 82* pain anterior shoulder  ABD 105* 105* pain anterior shoulder   FER Nape of neck Nape of neck  FIR L1  L3    LOWER EXTREMITY STRENGTH:   MMT Left/Right 07/20/2022  Right 08/17/2022 Left 08/17/2022 Right 08/30/2022 Left/Right in pounds 09/01/2022 Left/Right 10/13/22  Hip flexion    5/5 5/5   4/4  Hip extension          Hip abduction 4-/4-      3/2+  Hip adduction          Hip internal rotation          Hip external rotation          Knee flexion    5/5 5/5   4/4  Knee extension 4/4- 4/5 4+/5 4+/5 40.5/24.8 4+/4  Ankle dorsiflexion          Ankle plantarflexion          Ankle inversion          Ankle eversion           (Blank rows = not tested)  MMT  Left shoulder   Right shoulder    Date: 10/28/2022 Date: 10/28/2022  Shoulder flexion 4 4-  Shoulder ABD 4  4-  Shoulder ER 4 4-  Shoulder IR  4+ 4       GAIT: 11/08/2022:  Ambulation c reduced gait speed in clinic,  performed with cane use in Lt UE.  TUG c SPC:  20.39 seconds, 20.15 seconds   09/05/2022:  Ambulation into and in clinic independently.   08/10/2022:   SPC use in clinic (in Lt hand) with variable step to and step through gait pattern.   07/20/2022 Distance walked: 100 feet Assistive device utilized: Environmental consultant - 2 wheeled Level of assistance: Modified independence Comments: Limp on the right side due to pain, quadriceps and hip abductors weakness                   TODAY'S TREATMENT                                             Date:   11/15/2022 Therex NuStep L7 x 8 min Incline board calf stretch 3x30 sec Standing green tband rows on foam 2 x 15 Standing green tband GH ext on foam 2 x 15 Sit to/from stand holding 10# KB 2x10 Heel/toe raises x20 reps each; light UE support Standing hip abduction L3 band 2x10 bil Standing hip extension L3 band 2x10 bil Forward step ups onto 6" step 2x10 bil; bil UE support needed      TODAY'S TREATMENT                                             Date:   11/08/2022 Therex UBE UE/LE seat 7 lvl 3.0 8 mins  Standing green tband rows with feet together 2 x 15 Standing green tband GH ext with feet together 2 x 15 Incline gastroc stretch 30 sec x 5 bilateral   Neuro Re-ed Tandem stance 1 min x 2 bilateral with occasional HHA on bars Alternating heel/toe raises without UE assist x 20 each way  TherActivity (to improve step, transfers, ambulation) TUG x 2 Step up forward light hand assist on bar 4 inch step 2 x 10 bilaterally   TODAY'S TREATMENT                                             Date:   10/31/2022: Therapeutic Exercise: SciFit bike seat 8 level 1 with BLEs & BUEs 8 min Supine BUE dowel rod flexion 10 reps Supine SAQ RLE 5# 10 reps 2 sets Supine chest press dowel rod 10 reps Supine RUE shoulder AROM abduction 10 reps Supine heel slide with RLE on 18" ball with strap assist 10 reps  Standing UE ranger RUE flexion 10 reps Standing gastroc  stretch on step heel depression single LE BLEs 30 sec hold 2 reps    PATIENT EDUCATION:  07/20/2022 Education details: Reviewed exam findings and home exercise program Person educated: Patient Education method: Explanation, Demonstration, Tactile cues, Verbal cues, and Handouts Education comprehension: verbalized understanding, returned demonstration, verbal cues required, tactile cues required, and needs further education   HOME EXERCISE PROGRAM:  Access Code: [email or web address]  URL: [email or web address] Date: 10/28/2022 Prepared by: Nedra Hai  Exercises - Supine Quadricep Sets  - 3-5 x daily - 7 x weekly - 2-3 sets - 10 reps - 5 second hold - Clam with Resistance  - 3-5 x daily - 7 x weekly - 1-2 sets - 10 reps - 3 seconds hold - Small Range Straight Leg Raise  - 2 x daily - 7 x weekly - 3-5 sets - 5 reps - 3 seconds hold - Sidelying Hip Abduction  - 1 x daily - 7 x weekly - 1-2 sets - 10 reps - 5 seconds hold - Heel Toe Raises with Counter Support  - 3-5 x daily - 7 x weekly - 1 sets - 10 reps - 3 seconds hold - Tandem Stance  - 1 x daily - 7 x weekly - 1 sets - 4-5 reps - 20 second hold - Standing Gastroc Stretch  - 2 x daily - 7 x weekly - 1 sets - 5 reps - 20 seconds hold - Supine Shoulder Flexion Extension AAROM with Dowel  - 2 x daily - 7 x weekly - 1 sets - 10 reps - 2 seconds  hold - Scapular Retraction with Resistance  - 2 x daily - 7 x weekly - 1 sets - 10 reps - 2 seconds  hold - Seated Backward Shoulder Rolls  - 2 x daily - 7 x weekly - 1 sets - 20 reps   ASSESSMENT:   CLINICAL IMPRESSION: Pt tolerated session well today with focus on strengthening activities that also included some balance aspects. Will continue to benefit from PT to maximize function.   OBJECTIVE IMPAIRMENTS: Abnormal gait, decreased activity tolerance, decreased balance, decreased endurance, decreased knowledge of condition, difficulty walking, decreased strength, increased edema, impaired  perceived functional ability, and pain.    ACTIVITY LIMITATIONS: carrying, lifting, bending, sitting, standing, squatting, stairs, transfers, and locomotion level   PARTICIPATION LIMITATIONS: cleaning, driving, shopping, and community activity   PERSONAL FACTORS: CKD, arthritis, asthma, HTN, Lt THA are also affecting patient's functional outcome.    REHAB POTENTIAL: Good   CLINICAL DECISION MAKING: Stable/uncomplicated   EVALUATION COMPLEXITY: Low     GOALS: Goals reviewed with patient? Yes   SHORT TERM GOALS: Target date: 11/18/22 Cricket will be independent with her day 1 home exercise program Baseline: Started 07/20/2022 Goal status: Met 08/19/2022   2.  Eduardo will safely transition to a cane without an increase in her right knee edema Baseline: Wheeled walker Goal status:  ONGOING 10/13/22 back on RW due to pain levels/deconditioning      LONG TERM GOALS: Target date: 12/09/22   Reeve will report right knee pain consistently less than 3 out of 10 on the numeric pain rating scale Baseline: Can be as high as 6 out of 10 Goal status: ONGOING 11/08/2022   2.  Improve FOTO to 60 Baseline: 47, risk adjusted 45 Goal status: ONGOING 11/08/2022   3.  Improve bilateral quadriceps and hip abductor strength as assessed by MMT Baseline: See above Goal status: ONGOING 11/08/2022   4.  Aranza will be able to safely transition off any assistive device without increased right knee edema Baseline: Wheeled walker Goal status: ONGOING 11/08/2022   5.  Marsha will be independent with her long-term home exercise program at discharge Baseline: Started 07/20/2022 Goal status: ONGOING 11/08/2022  6. Rt shoulder pain to be no more than 3/10 at worst during functional task performance  Goal Status: on going  11/08/2022  7. Will demonstrate improved functional strength and ROM in Rt shoulder as evidenced by ability to perform all functional ADL and self care tasks without increase from  resting pain levels Goal Status: on going 11/08/2022  8. Bilateral shoulder strength to have improved by 1 grade MMT  Goal Status: on going 11/08/2022     PLAN:   PT FREQUENCY: 1-2 times per week   PT DURATION: 6 weeks    PLANNED INTERVENTIONS: Therapeutic exercises, Therapeutic activity, Neuromuscular re-education, Balance training, Gait training, Patient/Family education, Self Care, Stair training, Electrical stimulation, Cryotherapy, Vasopneumatic device, and Manual therapy   PLAN FOR NEXT SESSION:  Continue strength/balance improvements for LE.  Shoulder strengthening continued as necessary.  Objective data check on strength/ROM for UE or LE. Nearing end of POC  - discussed on 11/15/22 continuation v/s d/c  Clarita Crane, PT, DPT 11/15/22 10:12 AM

## 2022-11-22 ENCOUNTER — Encounter: Payer: Medicare PPO | Admitting: Rehabilitative and Restorative Service Providers"

## 2022-11-22 ENCOUNTER — Telehealth: Payer: Self-pay | Admitting: Rehabilitative and Restorative Service Providers"

## 2022-11-22 DIAGNOSIS — I120 Hypertensive chronic kidney disease with stage 5 chronic kidney disease or end stage renal disease: Secondary | ICD-10-CM | POA: Diagnosis not present

## 2022-11-22 DIAGNOSIS — D631 Anemia in chronic kidney disease: Secondary | ICD-10-CM | POA: Diagnosis not present

## 2022-11-22 DIAGNOSIS — M3214 Glomerular disease in systemic lupus erythematosus: Secondary | ICD-10-CM | POA: Diagnosis not present

## 2022-11-22 DIAGNOSIS — N189 Chronic kidney disease, unspecified: Secondary | ICD-10-CM | POA: Diagnosis not present

## 2022-11-22 DIAGNOSIS — M109 Gout, unspecified: Secondary | ICD-10-CM | POA: Diagnosis not present

## 2022-11-22 DIAGNOSIS — N185 Chronic kidney disease, stage 5: Secondary | ICD-10-CM | POA: Diagnosis not present

## 2022-11-22 DIAGNOSIS — N2581 Secondary hyperparathyroidism of renal origin: Secondary | ICD-10-CM | POA: Diagnosis not present

## 2022-11-22 NOTE — Telephone Encounter (Signed)
Called patient after 15 mins no show for appointment today.  Pt indicated having a conflict today and forgot to call and cancel/reschedule.  Rescheduled for tomorrow at 1pm.   Chyrel Masson, PT, DPT, OCS, ATC 11/22/22  9:53 AM

## 2022-11-23 ENCOUNTER — Ambulatory Visit (INDEPENDENT_AMBULATORY_CARE_PROVIDER_SITE_OTHER): Payer: Medicare PPO | Admitting: Physical Therapy

## 2022-11-23 ENCOUNTER — Encounter: Payer: Self-pay | Admitting: Physical Therapy

## 2022-11-23 DIAGNOSIS — R600 Localized edema: Secondary | ICD-10-CM | POA: Diagnosis not present

## 2022-11-23 DIAGNOSIS — R262 Difficulty in walking, not elsewhere classified: Secondary | ICD-10-CM

## 2022-11-23 DIAGNOSIS — M79661 Pain in right lower leg: Secondary | ICD-10-CM | POA: Diagnosis not present

## 2022-11-23 DIAGNOSIS — M25561 Pain in right knee: Secondary | ICD-10-CM

## 2022-11-23 DIAGNOSIS — M6281 Muscle weakness (generalized): Secondary | ICD-10-CM

## 2022-11-23 DIAGNOSIS — M25511 Pain in right shoulder: Secondary | ICD-10-CM

## 2022-11-23 DIAGNOSIS — G8929 Other chronic pain: Secondary | ICD-10-CM

## 2022-11-23 NOTE — Therapy (Signed)
OUTPATIENT PHYSICAL THERAPY TREATMENT     Patient Name: Colleen Obrien MRN: [id number] DOB:October 23, 1955, 67 y.o., female Today's Date: 07/20/2022   PT End of Session - 11/23/22 1312     Visit Number 17    Number of Visits 23    Date for PT Re-Evaluation 11/24/22    Authorization Type [employer]    Authorization Time Period new auth 10/13/22 to 11/24/22    Authorization - Number of Visits 12    PT Start Time 1310   pt arrived late   PT Stop Time 1340    PT Time Calculation (min) 30 min    Activity Tolerance Patient tolerated treatment well    Behavior During Therapy WFL for tasks assessed/performed                         Past Medical History:  Diagnosis Date   Anemia in chronic kidney disease (CKD)     Arthritis     Asthma     Blood dyscrasia      systemic lupus   Chronic kidney disease (CKD), active medical management without dialysis, stage 4 (severe) (HCC)     Dyspnea     GERD (gastroesophageal reflux disease)     Gout     Hypertension     Lupus (HCC)     Narcolepsy     Osteoporosis 05/05/2022   Pneumonia      x 1   Sleep apnea      tries to use CPAP nightly as of 04/29/22   Walker as Set designer dependent      when going outside home         Past Surgical History:  Procedure Laterality Date   CESAREAN SECTION        x 2   COLONOSCOPY       ganglion cyst removed Right      arm   TIBIA IM NAIL INSERTION Right 05/03/2022    Procedure: INTRAMEDULLARY (IM) NAIL TIBIAL;  Surgeon: Myrene Galas, MD;  Location: [facility] OR;  Service: Orthopedics;  Laterality: Right;   TOTAL HIP ARTHROPLASTY Left 08/26/2019    Procedure: LEFT TOTAL HIP ARTHROPLASTY ANTERIOR APPROACH;  Surgeon: Tarry Kos, MD;  Location: [facility] OR;  Service: Orthopedics;  Laterality: Left;   TUBAL LIGATION            Patient Active Problem List    Diagnosis Date Noted   Osteoporosis 05/05/2022   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 05/03/2022    Anemia of chronic renal failure 03/08/2021   Chronic renal disease, stage IV (HCC) 03/08/2021   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   AVN (avascular necrosis of bone) (HCC) 07/08/2019   Diverticulosis of colon without hemorrhage 07/08/2019   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  Surgery in 05/03/2022.  Rt shoulder January 2024  SUBJECTIVE:    SUBJECTIVE STATEMENT: Doing okay today; Rt calf is still painful   PERTINENT HISTORY: CKD, arthritis, asthma, HTN, Lt THA  PAIN:  NPRS scale: 5/10 Pain location: Rt calf Pain description: burning feeling Rt Aggravating factors: movement  RLE, R tshoulder laying on it and  Relieving factors: rest and ice/hot press   PRECAUTIONS: None   WEIGHT BEARING RESTRICTIONS: No   FALLS:  Has patient fallen in last 6 months? 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 No immediate postoperative complication.   PATIENT  SURVEYS:  10/13/22 42.2  08/17/2022:  FOTO 57;  07/20/2022 Eval FOTO 47 (risk-adjusted 45, Goal 60 in 15 visits)   COGNITION: 07/20/2022 Overall cognitive status: Within functional limits for tasks assessed                         SENSATION: 07/20/2022 Burning of inferior and medial knee, occasional shocking to lateral 2 toes from the medial ankle   EDEMA:  07/20/2022 Noted and not assssed secondary to starting 7 minutes late  PALPATION  10/28/22- anterior R shoulder TTP, no tenderness noted R bicep although she does report ongoing sharp pains there      LOWER EXTREMITY ROM:   Active ROM Left/Right 07/20/2022    Hip flexion      Hip extension      Hip abduction      Hip adduction      Hip internal rotation      Hip external rotation      Knee flexion 130/132    Knee extension 0/0    Ankle dorsiflexion      Ankle plantarflexion      Ankle inversion      Ankle eversion       (Blank rows = not tested)   Active ROM Lt shoulder  Rt shoulder    Date: 10/28/2022 Date: 10/28/2022  Flexion  132* 82* pain anterior shoulder  ABD 105* 105* pain anterior shoulder   FER Nape of neck Nape of neck  FIR L1  L3    LOWER EXTREMITY STRENGTH:   MMT Left/Right 07/20/2022  Right 08/17/2022 Left 08/17/2022 Right 08/30/2022 Left/Right in pounds 09/01/2022 Left/Right 10/13/22  Hip flexion    5/5 5/5   4/4  Hip extension          Hip abduction 4-/4-      3/2+  Hip adduction          Hip internal rotation          Hip external rotation          Knee flexion    5/5 5/5   4/4  Knee extension 4/4- 4/5 4+/5 4+/5 40.5/24.8 4+/4  Ankle dorsiflexion          Ankle plantarflexion          Ankle inversion          Ankle eversion           (Blank rows = not tested)  MMT  Left shoulder  Right shoulder    Date: 10/28/2022 Date: 10/28/2022  Shoulder flexion 4 4-  Shoulder ABD 4  4-  Shoulder ER 4 4-  Shoulder IR  4+ 4       GAIT: 11/08/2022:  Ambulation c reduced gait speed in clinic, performed  with cane use in Lt UE.  TUG c SPC:  20.39 seconds, 20.15 seconds   09/05/2022:  Ambulation into and in clinic independently.   08/10/2022:   SPC use in clinic (in Lt hand) with variable step to and step through gait pattern.   07/20/2022 Distance walked: 100 feet Assistive device utilized: Environmental consultant - 2 wheeled Level of assistance: Modified independence Comments: Limp on the right side due to pain, quadriceps and hip abductors weakness                   TODAY'S TREATMENT                                             Date:   11/23/2022 TherEx UBE UE/LE seat 7 L3 x 5 mins  Standing L3 tband rows with feet together 2 x 10 Standing L3 shoulder extension with feet together 2x10 Standing bil ER with feet together with L3 band 2x10 Standing bil horizontal abduction with feet together with L3 band 2x10 Standing incline board gastroc stretch 3x30 sec bil Calf raises on incline board 2x10 Forward step ups on 6" step 2x10 bil Standing on foam bil shoulder abduction 2x10; 1# weights Standing on foam bil shoulder flexion 2x10; 1# weights Standing on foam bil bicep curls with overhead press 2x10; 1# weights Standing hip abduction x10 reps bil Standing hip extension x10 reps bil    TODAY'S TREATMENT                                             Date:   11/22/2022 Therex UBE UE/LE seat 7 lvl 3.0 8 mins  Standing green tband rows with feet together 2 x 15 Standing green tband GH ext with feet together 2 x 15 Incline gastroc stretch 30 sec x 5 bilateral   Neuro Re-ed Tandem stance 1 min x 2 bilateral with occasional HHA on bars Alternating heel/toe raises without UE assist x 20 each way  TherActivity (to improve step, transfers, ambulation) TUG x 2 Step up forward light hand assist on bar 4 inch step 2 x 10 bilaterally   TODAY'S TREATMENT                                             Date:   11/15/2022 Therex NuStep L7 x 8 min Incline board calf stretch 3x30 sec Standing green tband rows on foam  2 x 15 Standing green tband GH ext on foam 2 x 15 Sit to/from stand holding 10# KB 2x10 Heel/toe raises x20 reps each; light UE support Standing hip abduction L3 band 2x10 bil Standing hip extension L3 band 2x10 bil Forward step ups onto 6" step 2x10 bil; bil UE support needed    TODAY'S TREATMENT  Date:   11/08/2022 Therex UBE UE/LE seat 7 lvl 3.0 8 mins  Standing green tband rows with feet together 2 x 15 Standing green tband GH ext with feet together 2 x 15 Incline gastroc stretch 30 sec x 5 bilateral   Neuro Re-ed Tandem stance 1 min x 2 bilateral with occasional HHA on bars Alternating heel/toe raises without UE assist x 20 each way  TherActivity (to improve step, transfers, ambulation) TUG x 2 Step up forward light hand assist on bar 4 inch step 2 x 10 bilaterally      PATIENT EDUCATION:  07/20/2022 Education details: Reviewed exam findings and home exercise program Person educated: Patient Education method: Explanation, Demonstration, Tactile cues, Verbal cues, and Handouts Education comprehension: verbalized understanding, returned demonstration, verbal cues required, tactile cues required, and needs further education   HOME EXERCISE PROGRAM:  Access Code: [email or web address]  URL: [email or web address] Date: 10/28/2022 Prepared by: Nedra Hai  Exercises - Supine Quadricep Sets  - 3-5 x daily - 7 x weekly - 2-3 sets - 10 reps - 5 second hold - Clam with Resistance  - 3-5 x daily - 7 x weekly - 1-2 sets - 10 reps - 3 seconds hold - Small Range Straight Leg Raise  - 2 x daily - 7 x weekly - 3-5 sets - 5 reps - 3 seconds hold - Sidelying Hip Abduction  - 1 x daily - 7 x weekly - 1-2 sets - 10 reps - 5 seconds hold - Heel Toe Raises with Counter Support  - 3-5 x daily - 7 x weekly - 1 sets - 10 reps - 3 seconds hold - Tandem Stance  - 1 x daily - 7 x weekly - 1 sets - 4-5 reps - 20 second hold - Standing Gastroc  Stretch  - 2 x daily - 7 x weekly - 1 sets - 5 reps - 20 seconds hold - Supine Shoulder Flexion Extension AAROM with Dowel  - 2 x daily - 7 x weekly - 1 sets - 10 reps - 2 seconds  hold - Scapular Retraction with Resistance  - 2 x daily - 7 x weekly - 1 sets - 10 reps - 2 seconds  hold - Seated Backward Shoulder Rolls  - 2 x daily - 7 x weekly - 1 sets - 20 reps   ASSESSMENT:   CLINICAL IMPRESSION: Pt tolerated session well today and feels like she's ready to transition to HEP and community fitness.  Will d/c PT next visit.    OBJECTIVE IMPAIRMENTS: Abnormal gait, decreased activity tolerance, decreased balance, decreased endurance, decreased knowledge of condition, difficulty walking, decreased strength, increased edema, impaired perceived functional ability, and pain.    ACTIVITY LIMITATIONS: carrying, lifting, bending, sitting, standing, squatting, stairs, transfers, and locomotion level   PARTICIPATION LIMITATIONS: cleaning, driving, shopping, and community activity   PERSONAL FACTORS: CKD, arthritis, asthma, HTN, Lt THA are also affecting patient's functional outcome.    REHAB POTENTIAL: Good   CLINICAL DECISION MAKING: Stable/uncomplicated   EVALUATION COMPLEXITY: Low     GOALS: Goals reviewed with patient? Yes   SHORT TERM GOALS: Target date: 11/18/22 Ketina will be independent with her day 1 home exercise program Baseline: Started 07/20/2022 Goal status: Met 08/19/2022   2.  Alessa will safely transition to a cane without an increase in her right knee edema Baseline: Wheeled walker Goal status:  ONGOING 10/13/22 back on RW due to pain levels/deconditioning      LONG TERM GOALS: Target date: 12/09/22  Tashieka will report right knee pain consistently less than 3 out of 10 on the numeric pain rating scale Baseline: Can be as high as 6 out of 10 Goal status: ONGOING 11/08/2022   2.  Improve FOTO to 60 Baseline: 47, risk adjusted 45 Goal status: ONGOING 11/08/2022    3.  Improve bilateral quadriceps and hip abductor strength as assessed by MMT Baseline: See above Goal status: ONGOING 11/08/2022   4.  Kareena will be able to safely transition off any assistive device without increased right knee edema Baseline: Wheeled walker Goal status: ONGOING 11/08/2022   5.  Gyneth will be independent with her long-term home exercise program at discharge Baseline: Started 07/20/2022 Goal status: ONGOING 11/08/2022  6. Rt shoulder pain to be no more than 3/10 at worst during functional task performance  Goal Status: on going 11/08/2022  7. Will demonstrate improved functional strength and ROM in Rt shoulder as evidenced by ability to perform all functional ADL and self care tasks without increase from resting pain levels Goal Status: on going 11/08/2022  8. Bilateral shoulder strength to have improved by 1 grade MMT  Goal Status: on going 11/08/2022     PLAN:   PT FREQUENCY: 1-2 times per week   PT DURATION: 6 weeks    PLANNED INTERVENTIONS: Therapeutic exercises, Therapeutic activity, Neuromuscular re-education, Balance training, Gait training, Patient/Family education, Self Care, Stair training, Electrical stimulation, Cryotherapy, Vasopneumatic device, and Manual therapy   PLAN FOR NEXT SESSION:  plan for d/c at next visit; check goals    Clarita Crane, PT, DPT 11/23/22 1:48 PM

## 2022-11-24 ENCOUNTER — Ambulatory Visit: Payer: Medicare PPO | Admitting: Vascular Surgery

## 2022-11-24 ENCOUNTER — Encounter: Payer: Self-pay | Admitting: Vascular Surgery

## 2022-11-24 VITALS — BP 161/105 | HR 77 | Temp 97.9°F | Resp 20 | Ht 62.0 in | Wt 173.0 lb

## 2022-11-24 DIAGNOSIS — N185 Chronic kidney disease, stage 5: Secondary | ICD-10-CM | POA: Diagnosis not present

## 2022-11-24 NOTE — Progress Notes (Signed)
Office Note    HPI: Colleen Obrien is a Right handed 67 y.o. (12-02-1955) female with kidney disease who presents at the request of Arita Miss, MD for permanent HD access. The patient has had no prior access procedures. No tunneled lines.   At her last clinic visit, she was chronic kidney disease stage IV, and needed a graft, therefore the procedure was placed on hold.  Colleen Obrien presents today accompanied by her husband. A [place] native, she moved to [place] when she met her husband.  They have grown sons who attended [employer].  Colleen Obrien suffers from lupus as well as has a longstanding history of hypertension, both of which likely contributed to her chronic kidney disease.  She also has narcolepsy.   On exam, Colleen Obrien was doing well with no complaints.  She was aware that she is in need of long-term HD access in the form of AV graft.    Right shoulder has improved after recent fracture that was treated nonoperatively.     The pt is  on a statin for cholesterol management.  The pt is  on a daily aspirin.   Other AC:  - The pt is  on medications for hypertension.   The pt is not diabetic. Tobacco hx:  -  Past Medical History:  Diagnosis Date   Anemia in chronic kidney disease (CKD)    Arthritis    Asthma    Blood dyscrasia    systemic lupus   Chronic renal disease, stage 5, glomerular filtration rate less than or equal to 15 mL/min/1.73 square meter (HCC)    Dyspnea    GERD (gastroesophageal reflux disease)    Gout    Hypertension    Lupus    Narcolepsy    Osteoporosis 05/05/2022   Pneumonia    x 1   Sleep apnea    tries to use CPAP nightly as of 04/29/22   Walker as Engineer, water dependent    when going outside home    Past Surgical History:  Procedure Laterality Date   CESAREAN SECTION     x 2   COLONOSCOPY     ganglion cyst removed Right    arm   TIBIA IM NAIL INSERTION Right 05/03/2022   Procedure: INTRAMEDULLARY (IM) NAIL  TIBIAL;  Surgeon: Myrene Galas, MD;  Location: [facility];  Service: Orthopedics;  Laterality: Right;   TOTAL HIP ARTHROPLASTY Left 08/26/2019   Procedure: LEFT TOTAL HIP ARTHROPLASTY ANTERIOR APPROACH;  Surgeon: Tarry Kos, MD;  Location: [facility];  Service: Orthopedics;  Laterality: Left;   TUBAL LIGATION      Social History   Socioeconomic History   Marital status: Married    Spouse name: Not on file   Number of children: 2   Years of education: College   Highest education level: Not on file  Occupational History   Occupation: [employer]  Tobacco Use   Smoking status: Never   Smokeless tobacco: Never  Vaping Use   Vaping status: Never Used  Substance and Sexual Activity   Alcohol use: No   Drug use: No   Sexual activity: Not on file  Other Topics Concern   Not on file  Social History Narrative   Lives at home with her husband.   Right-handed.   2-3 cups caffeine daily.   Social Determinants of Health   Financial Resource Strain: Not on file  Food Insecurity: No Food Insecurity (09/19/2022)   Hunger Vital Sign  Worried About Programme researcher, broadcasting/film/video in the Last Year: Never true    Ran Out of Food in the Last Year: Never true  Transportation Needs: No Transportation Needs (09/19/2022)   PRAPARE - Administrator, Civil Service (Medical): No    Lack of Transportation (Non-Medical): No  Physical Activity: Not on file  Stress: Not on file  Social Connections: Not on file  Intimate Partner Violence: Unknown (09/20/2022)   Humiliation, Afraid, Rape, and Kick questionnaire    Fear of Current or Ex-Partner: No    Emotionally Abused: No    Physically Abused: Patient declined    Sexually Abused: Patient declined    Family History  Problem Relation Age of Onset   Hypertension Mother    Diabetes Mother    Dementia Father    Hypertension Father    Hyperlipidemia Father    Diabetes Paternal Grandmother    Alcohol abuse Paternal Grandfather     Current  Outpatient Medications  Medication Sig Dispense Refill   acetaminophen (TYLENOL) 500 MG tablet Take 500 mg by mouth every 6 (six) hours as needed (for pain.).     albuterol (PROVENTIL HFA;VENTOLIN HFA) 108 (90 BASE) MCG/ACT inhaler Inhale 2 puffs into the lungs every 6 (six) hours as needed for wheezing or shortness of breath.     allopurinol (ZYLOPRIM) 100 MG tablet Take 250 mg by mouth daily.     benzonatate (TESSALON) 100 MG capsule Take 1 capsule (100 mg total) by mouth 3 (three) times daily. 20 capsule 0   budesonide-formoterol (SYMBICORT) 80-4.5 MCG/ACT inhaler Inhale 2 puffs into the lungs 2 (two) times daily as needed (asthma).     denosumab (PROLIA) 60 MG/ML SOLN injection Inject 60 mg into the skin every 6 (six) months. Administer in upper arm, thigh, or abdomen     felodipine (PLENDIL) 10 MG 24 hr tablet Take 10 mg by mouth daily.     furosemide (LASIX) 40 MG tablet Take 1 tablet (40 mg total) by mouth daily as needed (weight gain of 3 lbs in 1 day or 5 lbs over 1 week.).     guaiFENesin (MUCINEX) 600 MG 12 hr tablet Take 1 tablet (600 mg total) by mouth 2 (two) times daily. 30 tablet 0   hydroxychloroquine (PLAQUENIL) 200 MG tablet Take 200 mg by mouth daily.      labetalol (NORMODYNE) 300 MG tablet Take 300 mg by mouth 2 (two) times daily.     methocarbamol (ROBAXIN) 500 MG tablet Take 0.5 tablets (250 mg total) by mouth every 8 (eight) hours as needed for muscle spasms. 10 tablet 0   Vitamin D, Ergocalciferol, (DRISDOL) 1.25 MG (50000 UNIT) CAPS capsule Take 1 capsule (50,000 Units total) by mouth every 7 (seven) days. (Patient taking differently: Take 50,000 Units by mouth every 7 (seven) days. fridays) 5 capsule 6   No current facility-administered medications for this visit.    Allergies  Allergen Reactions   Erythromycin Nausea And Vomiting   Levofloxacin     Causes muscle pain    Sulfa Antibiotics Hives     REVIEW OF SYSTEMS:   [X]  denotes positive finding, [ ]   denotes negative finding Cardiac  Comments:  Chest pain or chest pressure:    Shortness of breath upon exertion:    Short of breath when lying flat:    Irregular heart rhythm:        Vascular    Pain in calf, thigh, or hip brought on by ambulation:  Pain in feet at night that wakes you up from your sleep:     Blood clot in your veins:    Leg swelling:         Pulmonary    Oxygen at home:    Productive cough:     Wheezing:         Neurologic    Sudden weakness in arms or legs:     Sudden numbness in arms or legs:     Sudden onset of difficulty speaking or slurred speech:    Temporary loss of vision in one eye:     Problems with dizziness:         Gastrointestinal    Blood in stool:     Vomited blood:         Genitourinary    Burning when urinating:     Blood in urine:        Psychiatric    Major depression:         Hematologic    Bleeding problems:    Problems with blood clotting too easily:        Skin    Rashes or ulcers:        Constitutional    Fever or chills:      PHYSICAL EXAMINATION:  There were no vitals filed for this visit.   General:  WDWN in NAD; vital signs documented above Gait: Not observed HENT: WNL, normocephalic Pulmonary: normal non-labored breathing , without Rales, rhonchi,  wheezing Cardiac: regular HR,  Abdomen: soft, NT, no masses Skin: without rashes Vascular Exam/Pulses:  Right Left  Radial 2+ (normal) 2+ (normal)  Ulnar 2+ (normal) 2+ (normal)                   Extremities: without ischemic changes, without Gangrene , without cellulitis; without open wounds;  Musculoskeletal: no muscle wasting or atrophy  Neurologic: A&O X 3;  No focal weakness or paresthesias are detected Psychiatric:  The pt has Normal affect.   Non-Invasive Vascular Imaging:   Noninvasive vascular imaging demonstrated adequate brachial artery for inflow. Noninvasive venous ultrasound demonstrated small veins  bilaterally.    ASSESSMENT/PLAN:  Colleen Obrien is a 67 y.o. female who presents with chronic kidney disease stage 4  Based on vein mapping and examination, she will require an AV graft.  Being that she is right-handed, she would be best served with a left-sided graft. She was offered left-sided AV graft, however she really wanted to talk to Dr. Annamary Rummage more.  She also noted that she is on the transplant list to do, and her son is being worked up as a possible donor.  Both her and her husband were hesitant to move forward with AV access. The patient is aware that the risks of access surgery include but are not limited to: bleeding, infection, steal syndrome, nerve damage, ischemic monomelic neuropathy, failure of access to mature, complications related to venous hypertension, and possible need for additional access procedures in the future. Both her and her husband asked to call the office when they are ready to proceed.  Victorino Sparrow, MD [facility] [phone] Total time of patient care including pre-visit research, consultation, and documentation greater than 20 minutes

## 2022-11-29 ENCOUNTER — Ambulatory Visit (HOSPITAL_COMMUNITY)
Admission: RE | Admit: 2022-11-29 | Discharge: 2022-11-29 | Disposition: A | Discharge status: Home / Self Care | Payer: Medicare PPO | Source: Ambulatory Visit | Attending: Nephrology | Admitting: Nephrology

## 2022-11-29 ENCOUNTER — Encounter: Payer: Medicare PPO | Admitting: Physical Therapy

## 2022-11-29 VITALS — BP 133/94 | HR 74 | Temp 97.1°F | Resp 17

## 2022-11-29 DIAGNOSIS — D631 Anemia in chronic kidney disease: Secondary | ICD-10-CM | POA: Diagnosis not present

## 2022-11-29 DIAGNOSIS — N189 Chronic kidney disease, unspecified: Secondary | ICD-10-CM | POA: Diagnosis not present

## 2022-11-29 LAB — RENAL FUNCTION PANEL
Albumin: 3.4 g/dL — ABNORMAL LOW (ref 3.5–5.0)
Anion gap: 11 (ref 5–15)
BUN: 76 mg/dL — ABNORMAL HIGH (ref 8–23)
CO2: 17 mmol/L — ABNORMAL LOW (ref 22–32)
Calcium: 10.3 mg/dL (ref 8.9–10.3)
Chloride: 109 mmol/L (ref 98–111)
Creatinine, Ser: 6.31 mg/dL — ABNORMAL HIGH (ref 0.44–1.00)
GFR, Estimated: 7 mL/min — ABNORMAL LOW (ref >60)
Glucose, Bld: 95 mg/dL (ref 70–99)
Phosphorus: 5.1 mg/dL — ABNORMAL HIGH (ref 2.5–4.6)
Potassium: 3.9 mmol/L (ref 3.5–5.1)
Sodium: 137 mmol/L (ref 135–145)

## 2022-11-29 LAB — IRON AND TIBC
Iron: 33 ug/dL (ref 28–170)
Saturation Ratios: 14 % (ref 10.4–31.8)
TIBC: 235 ug/dL — ABNORMAL LOW (ref 250–450)
UIBC: 202 ug/dL

## 2022-11-29 LAB — FERRITIN: Ferritin: 176 ng/mL (ref 11–307)

## 2022-11-29 LAB — URIC ACID: Uric Acid, Serum: 5.3 mg/dL (ref 2.5–7.1)

## 2022-11-29 MED ORDER — EPOETIN ALFA-EPBX 10000 UNIT/ML IJ SOLN
20000.0000 [IU] | INTRAMUSCULAR | Status: DC
Start: 2022-11-29 — End: 2022-11-30

## 2022-11-29 MED ORDER — EPOETIN ALFA-EPBX 10000 UNIT/ML IJ SOLN
INTRAMUSCULAR | Status: AC
  Administered 2022-11-29: 20000 [IU] via SUBCUTANEOUS
  Filled 2022-11-29: qty 2

## 2022-11-30 DIAGNOSIS — E559 Vitamin D deficiency, unspecified: Secondary | ICD-10-CM | POA: Diagnosis not present

## 2022-11-30 DIAGNOSIS — M1A09X1 Idiopathic chronic gout, multiple sites, with tophus (tophi): Secondary | ICD-10-CM | POA: Diagnosis not present

## 2022-11-30 DIAGNOSIS — M329 Systemic lupus erythematosus, unspecified: Secondary | ICD-10-CM | POA: Diagnosis not present

## 2022-11-30 DIAGNOSIS — M810 Age-related osteoporosis without current pathological fracture: Secondary | ICD-10-CM | POA: Diagnosis not present

## 2022-11-30 LAB — PTH, INTACT AND CALCIUM
Calcium, Total (PTH): 10.2 mg/dL (ref 8.7–10.3)
PTH: 659 pg/mL — ABNORMAL HIGH (ref 15–65)

## 2022-12-02 LAB — POCT HEMOGLOBIN-HEMACUE: Hemoglobin: 9.1 g/dL — ABNORMAL LOW (ref 12.0–15.0)

## 2022-12-05 DIAGNOSIS — Z7682 Awaiting organ transplant status: Secondary | ICD-10-CM | POA: Diagnosis not present

## 2022-12-07 ENCOUNTER — Encounter: Payer: Self-pay | Admitting: Physical Therapy

## 2022-12-07 ENCOUNTER — Ambulatory Visit (INDEPENDENT_AMBULATORY_CARE_PROVIDER_SITE_OTHER): Payer: Medicare PPO | Admitting: Physical Therapy

## 2022-12-07 DIAGNOSIS — G8929 Other chronic pain: Secondary | ICD-10-CM | POA: Diagnosis not present

## 2022-12-07 DIAGNOSIS — R262 Difficulty in walking, not elsewhere classified: Secondary | ICD-10-CM | POA: Diagnosis not present

## 2022-12-07 DIAGNOSIS — M25561 Pain in right knee: Secondary | ICD-10-CM

## 2022-12-07 DIAGNOSIS — M6281 Muscle weakness (generalized): Secondary | ICD-10-CM | POA: Diagnosis not present

## 2022-12-07 DIAGNOSIS — M25511 Pain in right shoulder: Secondary | ICD-10-CM

## 2022-12-07 NOTE — Therapy (Signed)
OUTPATIENT PHYSICAL THERAPY TREATMENT and DISCHARGE PHYSICAL THERAPY DISCHARGE SUMMARY  Visits from Start of Care: 18  Current functional level related to goals / functional outcomes: See below   Remaining deficits: See below   Education / Equipment: HEP  Plan:  Patient goals were mostly met. Patient is being discharged due to meeting most of the PT goals and will transition to independent program.        Patient Name: Colleen Obrien MRN: [id number] DOB:[birth year], 67 y.o., female Today's Date: 07/20/2022   PT End of Session - 12/07/22 1530     Visit Number 18    Number of Visits 23    Date for PT Re-Evaluation 11/24/22    Authorization Type [employer]    Authorization Time Period new auth 10/13/22 to 11/24/22    Authorization - Visit Number 6    Authorization - Number of Visits 12    PT Start Time 1523    PT Stop Time 1555    PT Time Calculation (min) 32 min    Activity Tolerance Patient tolerated treatment well    Behavior During Therapy WFL for tasks assessed/performed                         Past Medical History:  Diagnosis Date   Anemia in chronic kidney disease (CKD)     Arthritis     Asthma     Blood dyscrasia      systemic lupus   Chronic kidney disease (CKD), active medical management without dialysis, stage 4 (severe) (HCC)     Dyspnea     GERD (gastroesophageal reflux disease)     Gout     Hypertension     Lupus (HCC)     Narcolepsy     Osteoporosis 05/05/2022   Pneumonia      x 1   Sleep apnea      tries to use CPAP nightly as of 04/29/22   Walker as Set designer dependent      when going outside home         Past Surgical History:  Procedure Laterality Date   CESAREAN SECTION        x 2   COLONOSCOPY       ganglion cyst removed Right      arm   TIBIA IM NAIL INSERTION Right 05/03/2022    Procedure: INTRAMEDULLARY (IM) NAIL TIBIAL;  Surgeon: Myrene Galas, MD;  Location: [facility] OR;  Service: Orthopedics;   Laterality: Right;   TOTAL HIP ARTHROPLASTY Left 08/26/2019    Procedure: LEFT TOTAL HIP ARTHROPLASTY ANTERIOR APPROACH;  Surgeon: Tarry Kos, MD;  Location: [facility] OR;  Service: Orthopedics;  Laterality: Left;   TUBAL LIGATION            Patient Active Problem List    Diagnosis Date Noted   Osteoporosis 05/05/2022   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 05/03/2022   Anemia of chronic renal failure 03/08/2021   Chronic renal disease, stage IV (HCC) 03/08/2021   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   AVN (avascular necrosis of bone) (HCC) 07/08/2019   Diverticulosis of colon without hemorrhage 07/08/2019   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  Surgery in 05/03/2022.  Rt shoulder January 2024   SUBJECTIVE:    SUBJECTIVE STATEMENT: She says about 4/10 knee pain and 1/10 shoulder pain. She feels ready to discharge to independent program today   PERTINENT HISTORY: CKD, arthritis, asthma, HTN, Lt THA  PAIN:  NPRS scale: see subjective Pain location: Rt calf Pain description: burning feeling Rt Aggravating factors: movement  RLE, R tshoulder laying on it and  Relieving factors: rest and ice/hot press   PRECAUTIONS: None   WEIGHT BEARING RESTRICTIONS: No   FALLS:  Has patient fallen in last 6 months? 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 No immediate postoperative complication.   PATIENT SURVEYS:  12/07/22 FOTO 61% and met goal  10/13/22 42.2  08/17/2022:  FOTO 57;  07/20/2022 Eval FOTO 47 (risk-adjusted 45, Goal 60 in 15 visits)   COGNITION: 07/20/2022 Overall cognitive status: Within functional limits for tasks assessed                         SENSATION: 07/20/2022 Burning of inferior and medial knee, occasional shocking to lateral 2 toes from the medial ankle   EDEMA:  07/20/2022 Noted and not assssed secondary to starting 7 minutes late  PALPATION  10/28/22- anterior R shoulder TTP, no tenderness noted R bicep although she does report ongoing sharp pains there      LOWER EXTREMITY ROM:   Active ROM Left/Right 07/20/2022    Hip flexion      Hip extension      Hip abduction      Hip adduction      Hip internal rotation      Hip external rotation      Knee flexion 130/132    Knee extension 0/0    Ankle dorsiflexion      Ankle plantarflexion      Ankle inversion      Ankle eversion       (Blank rows = not tested)   Active ROM Lt shoulder  Rt shoulder    Date: 10/28/2022 Date: 10/28/2022  Flexion  132* 82* pain anterior shoulder  ABD 105* 105* pain anterior shoulder   FER Nape of neck Nape of neck  FIR L1  L3    LOWER EXTREMITY STRENGTH:   MMT Left/Right 07/20/2022  Right 08/17/2022 Left 08/17/2022 Right 08/30/2022 Left/Right in pounds 09/01/2022 Left/Right 10/13/22 Lt/Rt 12/07/22  Hip flexion    5/5 5/5   4/4 4/4+  Hip extension           Hip abduction 4-/4-      3/2+ 4+/4+  Hip adduction           Hip internal rotation  Hip external rotation           Knee flexion    5/5 5/5   4/4 5/5  Knee extension 4/4- 4/5 4+/5 4+/5 40.5/24.8 4+/4 5/5  Ankle dorsiflexion           Ankle plantarflexion           Ankle inversion           Ankle eversion            (Blank rows = not tested)  MMT  Left shoulder   Right shoulder  Lt/Rt 12/07/22   Date: 10/28/2022 Date: 10/28/2022   Shoulder flexion 4 4- 5/5  Shoulder ABD 4  4- 5/5  Shoulder ER 4 4- 5/5  Shoulder IR  4+ 4 5/5       GAIT: 11/08/2022:  Ambulation c reduced gait speed in clinic, performed with cane use in Lt UE.  TUG c SPC:  20.39 seconds, 20.15 seconds   09/05/2022:  Ambulation into and in clinic independently.   08/10/2022:   SPC use in clinic (in Lt hand) with variable step to and step through gait pattern.   07/20/2022 Distance walked: 100 feet Assistive device utilized: Environmental consultant - 2 wheeled Level of assistance: Modified independence Comments: Limp on the right side due to pain, quadriceps and hip abductors weakness                   TODAY'S TREATMENT                                             Date:    12/07/2022 TherEx UBE UE/LE seat 7 L3 x 8 mins  Standing L3 tband rows 2 x 10 Standing L3 shoulder extension  2x10 Standing bil ER  L3 band 2x10 Standing incline board gastroc stretch 3x30 sec bil Calf raises  2x10 Forward step ups on 6" step 2x10 bil Standing on foam bil shoulder abduction 2x10; 2# weights Standing on foam bil shoulder flexion 2x10; 2# weights Standing on foam bil bicep curls 2# weights Standing hip abduction x15reps bil Standing hip extension x15 reps bil  11/23/2022 TherEx UBE UE/LE seat 7 L3 x 5 mins  Standing L3 tband rows with feet together 2 x 10 Standing L3 shoulder extension with feet together 2x10 Standing bil ER with feet together with L3 band 2x10 Standing bil horizontal abduction with feet together with L3 band 2x10 Standing incline board gastroc stretch 3x30 sec bil Calf raises on incline board  2x10 Forward step ups on 6" step 2x10 bil Standing on foam bil shoulder abduction 2x10; 1# weights Standing on foam bil shoulder flexion 2x10; 1# weights Standing on foam bil bicep curls with overhead press 2x10; 1# weights Standing hip abduction x10 reps bil Standing hip extension x10 reps bil        PATIENT EDUCATION:  07/20/2022 Education details: Reviewed exam findings and home exercise program Person educated: Patient Education method: Explanation, Demonstration, Tactile cues, Verbal cues, and Handouts Education comprehension: verbalized understanding, returned demonstration, verbal cues required, tactile cues required, and needs further education   HOME EXERCISE PROGRAM:  Access Code: [email or web address]  URL: [email or web address] Date: 10/28/2022 Prepared by: Nedra Hai  Exercises - Supine Quadricep Sets  - 3-5 x daily - 7 x weekly - 2-3 sets - 10 reps - 5 second hold - Clam with Resistance  -  3-5 x daily - 7 x weekly - 1-2 sets - 10 reps - 3 seconds hold - Small Range Straight Leg Raise  - 2 x daily - 7 x weekly - 3-5 sets - 5 reps - 3 seconds hold - Sidelying Hip Abduction  - 1 x daily - 7 x weekly - 1-2 sets - 10 reps - 5 seconds hold - Heel Toe Raises with Counter Support  - 3-5 x daily - 7 x weekly - 1 sets - 10 reps - 3 seconds hold - Tandem Stance  - 1 x daily - 7 x weekly - 1 sets - 4-5 reps - 20 second hold - Standing Gastroc Stretch  - 2 x daily - 7 x weekly - 1 sets - 5 reps - 20 seconds hold - Supine Shoulder Flexion Extension AAROM with Dowel  - 2 x daily - 7 x weekly - 1 sets - 10 reps - 2 seconds  hold - Scapular Retraction with Resistance  - 2 x daily - 7 x weekly - 1 sets - 10 reps - 2 seconds  hold - Seated Backward Shoulder Rolls  - 2 x daily - 7 x weekly - 1 sets - 20 reps   ASSESSMENT:   CLINICAL IMPRESSION: She has completed 18 PT visits with good overall progress and has met almost all her PT goals. She feels ready to transition to  independent program and will be discharged today.    OBJECTIVE IMPAIRMENTS: Abnormal gait, decreased activity tolerance, decreased balance, decreased endurance, decreased knowledge of condition, difficulty walking, decreased strength, increased edema, impaired perceived functional ability, and pain.    ACTIVITY LIMITATIONS: carrying, lifting, bending, sitting, standing, squatting, stairs, transfers, and locomotion level   PARTICIPATION LIMITATIONS: cleaning, driving, shopping, and community activity   PERSONAL FACTORS: CKD, arthritis, asthma, HTN, Lt THA are also affecting patient's functional outcome.    REHAB POTENTIAL: Good   CLINICAL DECISION MAKING: Stable/uncomplicated   EVALUATION COMPLEXITY: Low     GOALS: Goals reviewed with patient? Yes   SHORT TERM GOALS: Target date: 11/18/22 Colleen Obrien will be independent with her day 1 home exercise program Baseline: Started 07/20/2022 Goal status: Met 08/19/2022   2.  Colleen Obrien will safely transition to a cane without an increase in her right knee edema Baseline: Wheeled walker Goal status:  MET 12/07/22     LONG TERM GOALS: Target date: 12/09/22   Colleen Obrien will report right knee pain consistently less than 3 out of 10 on the numeric pain rating scale Baseline: Can be as high as 6 out of 10 Goal status: partially met, was 4/10 today 12/07/22   2.  Improve FOTO to 60 Baseline: 47, risk adjusted 45 Goal status: MET, improved to 61% functional   3.  Improve bilateral quadriceps and hip abductor strength as assessed by MMT Baseline: See above Goal status: MET 11/56/24   4.  Colleen Obrien will be able to safely transition off any assistive device without increased right knee edema Baseline: Wheeled walker Goal status:MET 12/07/22   5.  Colleen Obrien will be independent with her long-term home exercise program at discharge Baseline: Started 07/20/2022 Goal status: MET shows understanding of them and plans to continue with them 12/07/22  6. Rt  shoulder pain to be no more than 3/10 at worst during functional task performance  Goal Status: MET 12/07/22, reports 1/10 today  7. Will demonstrate improved functional strength and ROM in Rt shoulder as evidenced by ability to perform all functional ADL and self care  tasks without increase from resting pain levels Goal Status: MET 12/07/22  8. Bilateral shoulder strength to have improved by 1 grade MMT  Goal Status: MET 12/07/22     PLAN:   PT FREQUENCY: 1-2 times per week   PT DURATION: 6 weeks    PLANNED INTERVENTIONS: Therapeutic exercises, Therapeutic activity, Neuromuscular re-education, Balance training, Gait training, Patient/Family education, Self Care, Stair training, Electrical stimulation, Cryotherapy, Vasopneumatic device, and Manual therapy   PLAN FOR NEXT SESSION: DC today   Ivery Quale, PT, DPT 12/07/22 3:31 PM

## 2022-12-20 DIAGNOSIS — Z8781 Personal history of (healed) traumatic fracture: Secondary | ICD-10-CM | POA: Diagnosis not present

## 2022-12-20 DIAGNOSIS — Z1159 Encounter for screening for other viral diseases: Secondary | ICD-10-CM | POA: Diagnosis not present

## 2022-12-20 DIAGNOSIS — I129 Hypertensive chronic kidney disease with stage 1 through stage 4 chronic kidney disease, or unspecified chronic kidney disease: Secondary | ICD-10-CM | POA: Diagnosis not present

## 2022-12-20 DIAGNOSIS — Z01818 Encounter for other preprocedural examination: Secondary | ICD-10-CM | POA: Diagnosis not present

## 2022-12-20 DIAGNOSIS — N185 Chronic kidney disease, stage 5: Secondary | ICD-10-CM | POA: Diagnosis not present

## 2022-12-20 DIAGNOSIS — Z7682 Awaiting organ transplant status: Secondary | ICD-10-CM | POA: Diagnosis not present

## 2022-12-20 DIAGNOSIS — Z114 Encounter for screening for human immunodeficiency virus [HIV]: Secondary | ICD-10-CM | POA: Diagnosis not present

## 2022-12-21 ENCOUNTER — Other Ambulatory Visit: Payer: Self-pay | Admitting: Orthopedic Surgery

## 2022-12-21 DIAGNOSIS — M84361K Stress fracture, right tibia, subsequent encounter for fracture with nonunion: Secondary | ICD-10-CM | POA: Diagnosis not present

## 2022-12-23 DIAGNOSIS — N2581 Secondary hyperparathyroidism of renal origin: Secondary | ICD-10-CM | POA: Diagnosis not present

## 2022-12-23 DIAGNOSIS — M25569 Pain in unspecified knee: Secondary | ICD-10-CM | POA: Diagnosis not present

## 2022-12-23 DIAGNOSIS — D631 Anemia in chronic kidney disease: Secondary | ICD-10-CM | POA: Diagnosis not present

## 2022-12-23 DIAGNOSIS — N189 Chronic kidney disease, unspecified: Secondary | ICD-10-CM | POA: Diagnosis not present

## 2022-12-23 DIAGNOSIS — N185 Chronic kidney disease, stage 5: Secondary | ICD-10-CM | POA: Diagnosis not present

## 2022-12-23 DIAGNOSIS — M109 Gout, unspecified: Secondary | ICD-10-CM | POA: Diagnosis not present

## 2022-12-23 DIAGNOSIS — M3214 Glomerular disease in systemic lupus erythematosus: Secondary | ICD-10-CM | POA: Diagnosis not present

## 2022-12-23 DIAGNOSIS — I120 Hypertensive chronic kidney disease with stage 5 chronic kidney disease or end stage renal disease: Secondary | ICD-10-CM | POA: Diagnosis not present

## 2022-12-27 ENCOUNTER — Ambulatory Visit (HOSPITAL_COMMUNITY)
Admission: RE | Admit: 2022-12-27 | Discharge: 2022-12-27 | Disposition: A | Discharge status: Home / Self Care | Payer: Medicare PPO | Source: Ambulatory Visit | Attending: Nephrology | Admitting: Nephrology

## 2022-12-27 VITALS — BP 144/98 | HR 79 | Temp 97.5°F | Resp 17

## 2022-12-27 DIAGNOSIS — N189 Chronic kidney disease, unspecified: Secondary | ICD-10-CM | POA: Diagnosis not present

## 2022-12-27 DIAGNOSIS — D631 Anemia in chronic kidney disease: Secondary | ICD-10-CM | POA: Insufficient documentation

## 2022-12-27 LAB — POCT HEMOGLOBIN-HEMACUE: Hemoglobin: 10 g/dL — ABNORMAL LOW (ref 12.0–15.0)

## 2022-12-27 LAB — URIC ACID: Uric Acid, Serum: 4.4 mg/dL (ref 2.5–7.1)

## 2022-12-27 LAB — RENAL FUNCTION PANEL
Albumin: 3.4 g/dL — ABNORMAL LOW (ref 3.5–5.0)
Anion gap: 11 (ref 5–15)
BUN: 69 mg/dL — ABNORMAL HIGH (ref 8–23)
CO2: 17 mmol/L — ABNORMAL LOW (ref 22–32)
Calcium: 10.7 mg/dL — ABNORMAL HIGH (ref 8.9–10.3)
Chloride: 108 mmol/L (ref 98–111)
Creatinine, Ser: 6.47 mg/dL — ABNORMAL HIGH (ref 0.44–1.00)
GFR, Estimated: 7 mL/min — ABNORMAL LOW (ref >60)
Glucose, Bld: 91 mg/dL (ref 70–99)
Phosphorus: 6.6 mg/dL — ABNORMAL HIGH (ref 2.5–4.6)
Potassium: 4.3 mmol/L (ref 3.5–5.1)
Sodium: 136 mmol/L (ref 135–145)

## 2022-12-27 LAB — IRON AND TIBC
Iron: 39 ug/dL (ref 28–170)
Saturation Ratios: 16 % (ref 10.4–31.8)
TIBC: 244 ug/dL — ABNORMAL LOW (ref 250–450)
UIBC: 205 ug/dL

## 2022-12-27 MED ORDER — EPOETIN ALFA-EPBX 10000 UNIT/ML IJ SOLN: 20000.0000 [IU] | INTRAMUSCULAR | Status: DC

## 2022-12-27 MED ORDER — EPOETIN ALFA-EPBX 10000 UNIT/ML IJ SOLN
INTRAMUSCULAR | Status: AC
  Administered 2022-12-27: 20000 [IU] via SUBCUTANEOUS
  Filled 2022-12-27: qty 2

## 2022-12-28 ENCOUNTER — Other Ambulatory Visit (HOSPITAL_COMMUNITY): Payer: Self-pay | Admitting: Nephrology

## 2022-12-28 DIAGNOSIS — E210 Primary hyperparathyroidism: Secondary | ICD-10-CM

## 2023-01-06 ENCOUNTER — Ambulatory Visit
Admission: RE | Admit: 2023-01-06 | Discharge: 2023-01-06 | Disposition: A | Discharge status: Home / Self Care | Payer: Medicare PPO | Source: Ambulatory Visit | Attending: Orthopedic Surgery | Admitting: Orthopedic Surgery

## 2023-01-06 DIAGNOSIS — M25569 Pain in unspecified knee: Secondary | ICD-10-CM | POA: Diagnosis not present

## 2023-01-06 DIAGNOSIS — D631 Anemia in chronic kidney disease: Secondary | ICD-10-CM | POA: Diagnosis not present

## 2023-01-06 DIAGNOSIS — N2581 Secondary hyperparathyroidism of renal origin: Secondary | ICD-10-CM | POA: Diagnosis not present

## 2023-01-06 DIAGNOSIS — M109 Gout, unspecified: Secondary | ICD-10-CM | POA: Diagnosis not present

## 2023-01-06 DIAGNOSIS — N185 Chronic kidney disease, stage 5: Secondary | ICD-10-CM | POA: Diagnosis not present

## 2023-01-06 DIAGNOSIS — M3214 Glomerular disease in systemic lupus erythematosus: Secondary | ICD-10-CM | POA: Diagnosis not present

## 2023-01-06 DIAGNOSIS — I120 Hypertensive chronic kidney disease with stage 5 chronic kidney disease or end stage renal disease: Secondary | ICD-10-CM | POA: Diagnosis not present

## 2023-01-06 DIAGNOSIS — S82224K Nondisplaced transverse fracture of shaft of right tibia, subsequent encounter for closed fracture with nonunion: Secondary | ICD-10-CM | POA: Diagnosis not present

## 2023-01-06 DIAGNOSIS — M84361K Stress fracture, right tibia, subsequent encounter for fracture with nonunion: Secondary | ICD-10-CM

## 2023-01-06 DIAGNOSIS — K219 Gastro-esophageal reflux disease without esophagitis: Secondary | ICD-10-CM | POA: Diagnosis not present

## 2023-01-16 ENCOUNTER — Encounter (HOSPITAL_COMMUNITY)
Admission: RE | Admit: 2023-01-16 | Discharge: 2023-01-16 | Disposition: A | Discharge status: Home / Self Care | Payer: Medicare PPO | Source: Ambulatory Visit | Attending: Nephrology | Admitting: Nephrology

## 2023-01-16 DIAGNOSIS — E210 Primary hyperparathyroidism: Secondary | ICD-10-CM | POA: Insufficient documentation

## 2023-01-16 DIAGNOSIS — R937 Abnormal findings on diagnostic imaging of other parts of musculoskeletal system: Secondary | ICD-10-CM | POA: Diagnosis not present

## 2023-01-16 MED ORDER — TECHNETIUM TC 99M SESTAMIBI - CARDIOLITE
25.0000 | Freq: Once | INTRAVENOUS | Status: AC | PRN
  Administered 2023-01-16: 25 via INTRAVENOUS

## 2023-01-17 DIAGNOSIS — N185 Chronic kidney disease, stage 5: Secondary | ICD-10-CM | POA: Diagnosis not present

## 2023-01-18 DIAGNOSIS — T8484XA Pain due to internal orthopedic prosthetic devices, implants and grafts, initial encounter: Secondary | ICD-10-CM | POA: Diagnosis not present

## 2023-01-24 ENCOUNTER — Ambulatory Visit (HOSPITAL_COMMUNITY)
Admission: RE | Admit: 2023-01-24 | Discharge: 2023-01-24 | Disposition: A | Discharge status: Home / Self Care | Payer: Medicare PPO | Source: Ambulatory Visit | Attending: Nephrology | Admitting: Nephrology

## 2023-01-24 VITALS — BP 130/92 | HR 70 | Temp 97.3°F | Resp 18

## 2023-01-24 DIAGNOSIS — N189 Chronic kidney disease, unspecified: Secondary | ICD-10-CM | POA: Diagnosis not present

## 2023-01-24 DIAGNOSIS — D631 Anemia in chronic kidney disease: Secondary | ICD-10-CM | POA: Insufficient documentation

## 2023-01-24 LAB — IRON AND TIBC
Iron: 51 ug/dL (ref 28–170)
Saturation Ratios: 21 % (ref 10.4–31.8)
TIBC: 249 ug/dL — ABNORMAL LOW (ref 250–450)
UIBC: 198 ug/dL

## 2023-01-24 LAB — POCT HEMOGLOBIN-HEMACUE: Hemoglobin: 9.3 g/dL — ABNORMAL LOW (ref 12.0–15.0)

## 2023-01-24 LAB — RENAL FUNCTION PANEL
Albumin: 3.6 g/dL (ref 3.5–5.0)
Anion gap: 13 (ref 5–15)
BUN: 87 mg/dL — ABNORMAL HIGH (ref 8–23)
CO2: 15 mmol/L — ABNORMAL LOW (ref 22–32)
Calcium: 10.3 mg/dL (ref 8.9–10.3)
Chloride: 107 mmol/L (ref 98–111)
Creatinine, Ser: 6.89 mg/dL — ABNORMAL HIGH (ref 0.44–1.00)
GFR, Estimated: 6 mL/min — ABNORMAL LOW (ref >60)
Glucose, Bld: 92 mg/dL (ref 70–99)
Phosphorus: 5.3 mg/dL — ABNORMAL HIGH (ref 2.5–4.6)
Potassium: 4.1 mmol/L (ref 3.5–5.1)
Sodium: 135 mmol/L (ref 135–145)

## 2023-01-24 LAB — URIC ACID: Uric Acid, Serum: 4.2 mg/dL (ref 2.5–7.1)

## 2023-01-24 MED ORDER — EPOETIN ALFA-EPBX 10000 UNIT/ML IJ SOLN: 20000.0000 [IU] | INTRAMUSCULAR | Status: DC

## 2023-01-24 MED ORDER — EPOETIN ALFA-EPBX 10000 UNIT/ML IJ SOLN
INTRAMUSCULAR | Status: AC
  Administered 2023-01-24: 20000 [IU] via SUBCUTANEOUS
  Filled 2023-01-24: qty 2

## 2023-01-28 ENCOUNTER — Encounter (HOSPITAL_COMMUNITY): Payer: Self-pay

## 2023-01-28 ENCOUNTER — Emergency Department (HOSPITAL_COMMUNITY): Payer: Medicare PPO

## 2023-01-28 ENCOUNTER — Emergency Department (HOSPITAL_COMMUNITY)
Admission: EM | Admit: 2023-01-28 | Discharge: 2023-01-28 | Disposition: A | Discharge status: Home / Self Care | Payer: Medicare PPO | Attending: Student in an Organized Health Care Education/Training Program | Admitting: Student in an Organized Health Care Education/Training Program

## 2023-01-28 ENCOUNTER — Other Ambulatory Visit: Payer: Self-pay

## 2023-01-28 DIAGNOSIS — N185 Chronic kidney disease, stage 5: Secondary | ICD-10-CM | POA: Insufficient documentation

## 2023-01-28 DIAGNOSIS — Z20822 Contact with and (suspected) exposure to covid-19: Secondary | ICD-10-CM | POA: Insufficient documentation

## 2023-01-28 DIAGNOSIS — J45909 Unspecified asthma, uncomplicated: Secondary | ICD-10-CM | POA: Insufficient documentation

## 2023-01-28 DIAGNOSIS — R0602 Shortness of breath: Secondary | ICD-10-CM | POA: Diagnosis not present

## 2023-01-28 DIAGNOSIS — R051 Acute cough: Secondary | ICD-10-CM | POA: Diagnosis not present

## 2023-01-28 DIAGNOSIS — R059 Cough, unspecified: Secondary | ICD-10-CM | POA: Diagnosis present

## 2023-01-28 DIAGNOSIS — I120 Hypertensive chronic kidney disease with stage 5 chronic kidney disease or end stage renal disease: Secondary | ICD-10-CM | POA: Diagnosis not present

## 2023-01-28 DIAGNOSIS — R058 Other specified cough: Secondary | ICD-10-CM | POA: Diagnosis not present

## 2023-01-28 DIAGNOSIS — Z992 Dependence on renal dialysis: Secondary | ICD-10-CM | POA: Diagnosis not present

## 2023-01-28 DIAGNOSIS — K449 Diaphragmatic hernia without obstruction or gangrene: Secondary | ICD-10-CM | POA: Diagnosis not present

## 2023-01-28 DIAGNOSIS — Z79899 Other long term (current) drug therapy: Secondary | ICD-10-CM | POA: Insufficient documentation

## 2023-01-28 DIAGNOSIS — R052 Subacute cough: Secondary | ICD-10-CM

## 2023-01-28 DIAGNOSIS — N186 End stage renal disease: Secondary | ICD-10-CM | POA: Diagnosis not present

## 2023-01-28 DIAGNOSIS — R0981 Nasal congestion: Secondary | ICD-10-CM | POA: Diagnosis not present

## 2023-01-28 DIAGNOSIS — R053 Chronic cough: Secondary | ICD-10-CM | POA: Diagnosis not present

## 2023-01-28 LAB — CBC WITH DIFFERENTIAL/PLATELET
Abs Immature Granulocytes: 0.04 10*3/uL (ref 0.00–0.07)
Basophils Absolute: 0.1 10*3/uL (ref 0.0–0.1)
Basophils Relative: 1 %
Eosinophils Absolute: 0.4 10*3/uL (ref 0.0–0.5)
Eosinophils Relative: 4 %
HCT: 29 % — ABNORMAL LOW (ref 36.0–46.0)
Hemoglobin: 9.2 g/dL — ABNORMAL LOW (ref 12.0–15.0)
Immature Granulocytes: 0 %
Lymphocytes Relative: 8 %
Lymphs Abs: 0.8 10*3/uL (ref 0.7–4.0)
MCH: 29.2 pg (ref 26.0–34.0)
MCHC: 31.7 g/dL (ref 30.0–36.0)
MCV: 92.1 fL (ref 80.0–100.0)
Monocytes Absolute: 0.8 10*3/uL (ref 0.1–1.0)
Monocytes Relative: 9 %
Neutro Abs: 7.5 10*3/uL (ref 1.7–7.7)
Neutrophils Relative %: 78 %
Platelets: 211 10*3/uL (ref 150–400)
RBC: 3.15 MIL/uL — ABNORMAL LOW (ref 3.87–5.11)
RDW: 17.4 % — ABNORMAL HIGH (ref 11.5–15.5)
WBC: 9.7 10*3/uL (ref 4.0–10.5)
nRBC: 0.8 % — ABNORMAL HIGH (ref 0.0–0.2)

## 2023-01-28 LAB — COMPREHENSIVE METABOLIC PANEL
ALT: 19 U/L (ref 0–44)
AST: 22 U/L (ref 15–41)
Albumin: 3.4 g/dL — ABNORMAL LOW (ref 3.5–5.0)
Alkaline Phosphatase: 128 U/L — ABNORMAL HIGH (ref 38–126)
Anion gap: 11 (ref 5–15)
BUN: 73 mg/dL — ABNORMAL HIGH (ref 8–23)
CO2: 16 mmol/L — ABNORMAL LOW (ref 22–32)
Calcium: 11 mg/dL — ABNORMAL HIGH (ref 8.9–10.3)
Chloride: 111 mmol/L (ref 98–111)
Creatinine, Ser: 6.08 mg/dL — ABNORMAL HIGH (ref 0.44–1.00)
GFR, Estimated: 7 mL/min — ABNORMAL LOW (ref >60)
Glucose, Bld: 96 mg/dL (ref 70–99)
Potassium: 3.7 mmol/L (ref 3.5–5.1)
Sodium: 138 mmol/L (ref 135–145)
Total Bilirubin: 0.7 mg/dL (ref <1.2)
Total Protein: 6.9 g/dL (ref 6.5–8.1)

## 2023-01-28 LAB — RESP PANEL BY RT-PCR (RSV, FLU A&B, COVID)  RVPGX2
Influenza A by PCR: NEGATIVE
Influenza B by PCR: NEGATIVE
Resp Syncytial Virus by PCR: NEGATIVE
SARS Coronavirus 2 by RT PCR: NEGATIVE

## 2023-01-28 LAB — BRAIN NATRIURETIC PEPTIDE: B Natriuretic Peptide: 113.4 pg/mL — ABNORMAL HIGH (ref 0.0–100.0)

## 2023-01-28 LAB — TROPONIN I (HIGH SENSITIVITY)
Troponin I (High Sensitivity): 17 ng/L (ref <18)
Troponin I (High Sensitivity): 18 ng/L — ABNORMAL HIGH (ref <18)

## 2023-01-28 MED ORDER — ALBUTEROL SULFATE HFA 108 (90 BASE) MCG/ACT IN AERS
4.0000 | INHALATION_SPRAY | Freq: Once | RESPIRATORY_TRACT | Status: AC
  Administered 2023-01-28: 4 via RESPIRATORY_TRACT
  Filled 2023-01-28: qty 6.7

## 2023-01-28 NOTE — ED Triage Notes (Signed)
PT has had a cough for at least a month. Over the last week pt states the cough has been getting worse, and now producing a yellow phlegm. Pt also endorses some SOB last night, and an episode of diarrhea this morning.

## 2023-01-28 NOTE — Discharge Instructions (Signed)
You were seen in the emergency department today due to your cough.  Your chest x-ray does not show pneumonia at this time.  Please follow-up with your vascular surgeon regarding the dialysis catheter and your nephrologist regarding initiating dialysis.  Continue to take albuterol as needed.  Return if you have any worsening symptoms or further concerns.

## 2023-01-28 NOTE — ED Provider Notes (Signed)
[place] EMERGENCY DEPARTMENT AT [facility] Provider Note   CSN: [id number] Arrival date & time: 01/28/23  1043     History  Chief Complaint  Patient presents with   Cough    Colleen Obrien is a 67 y.o. female.  67 year old female with an extensive past medical history including lupus nephritis, hypertension, stage V renal failure, asthma, narcolepsy, and OSA who presents emergency department with a cough.  She reports his cough has been mild over the last month but over the last few days worsening.  Its become productive and associated with nasal congestion.  She does report improvement with her albuterol inhaler.   She denies any orthopnea, lower extremity swelling, chest pain, shortness of breath, or abdominal pain. Patient is currently on the list for a renal transplant.  She has known stage V renal failure and follows closely with her nephrologist and transplant team.  She had blood work checked 4 days ago by her nephrologist and has an appointment this week with him.   Cough      Home Medications Prior to Admission medications   Medication Sig Start Date End Date Taking? Authorizing Provider  acetaminophen (TYLENOL) 500 MG tablet Take 500 mg by mouth every 6 (six) hours as needed (for pain.).    [provider]  albuterol (PROVENTIL HFA;VENTOLIN HFA) 108 (90 BASE) MCG/ACT inhaler Inhale 2 puffs into the lungs every 6 (six) hours as needed for wheezing or shortness of breath.    [provider]  allopurinol (ZYLOPRIM) 100 MG tablet Take 250 mg by mouth daily.    [provider]  benzonatate (TESSALON) 100 MG capsule Take 1 capsule (100 mg total) by mouth 3 (three) times daily. 09/22/22   Rolly Salter, MD  budesonide-formoterol [facility]) 80-4.5 MCG/ACT inhaler Inhale 2 puffs into the lungs 2 (two) times daily as needed (asthma).    [provider]  denosumab (PROLIA) 60 MG/ML SOLN injection Inject 60 mg into the  skin every 6 (six) months. Administer in upper arm, thigh, or abdomen    [provider]  felodipine (PLENDIL) 10 MG 24 hr tablet Take 10 mg by mouth daily. 01/30/21   [provider]  furosemide (LASIX) 40 MG tablet Take 1 tablet (40 mg total) by mouth daily as needed (weight gain of 3 lbs in 1 day or 5 lbs over 1 week.). 09/22/22   Rolly Salter, MD  guaiFENesin (MUCINEX) 600 MG 12 hr tablet Take 1 tablet (600 mg total) by mouth 2 (two) times daily. 09/22/22   Rolly Salter, MD  hydroxychloroquine (PLAQUENIL) 200 MG tablet Take 200 mg by mouth daily.     [provider]  labetalol (NORMODYNE) 300 MG tablet Take 300 mg by mouth 2 (two) times daily. 04/11/22   [provider]  methocarbamol (ROBAXIN) 500 MG tablet Take 0.5 tablets (250 mg total) by mouth every 8 (eight) hours as needed for muscle spasms. 09/22/22   Rolly Salter, MD  Vitamin D, Ergocalciferol, (DRISDOL) 1.25 MG (50000 UNIT) CAPS capsule Take 1 capsule (50,000 Units total) by mouth every 7 (seven) days. Patient taking differently: Take 50,000 Units by mouth every 7 (seven) days. fridays 12/29/21   Tarry Kos, MD      Allergies    Erythromycin, Levofloxacin, and Sulfa antibiotics    Review of Systems   Review of Systems  Respiratory:  Positive for cough.   All other systems reviewed and are negative.   Physical  Exam Updated Vital Signs BP 121/70   Pulse 72   Temp 97.8 F (36.6 C)   Resp 16   Ht 5\' 10"  (1.778 m)   Wt 78.5 kg   SpO2 97%   BMI 24.82 kg/m  Physical Exam Vitals reviewed.  Constitutional:      General: She is not in acute distress. HENT:     Head: Normocephalic.     Nose: Nose normal.     Mouth/Throat:     Mouth: Mucous membranes are moist.     Pharynx: Oropharynx is clear.  Eyes:     Conjunctiva/sclera: Conjunctivae normal.     Pupils: Pupils are equal, round, and reactive to light.  Cardiovascular:     Rate and Rhythm: Normal rate and regular rhythm.      Pulses: Normal pulses.  Pulmonary:     Effort: Pulmonary effort is normal. No respiratory distress.  Abdominal:     General: Abdomen is flat.     Palpations: Abdomen is soft.  Musculoskeletal:        General: Normal range of motion.     Cervical back: Neck supple.  Skin:    General: Skin is warm.     Capillary Refill: Capillary refill takes less than 2 seconds.  Neurological:     Mental Status: She is alert. Mental status is at baseline.     ED Results / Procedures / Treatments   Labs (all labs ordered are listed, but only abnormal results are displayed) Labs Reviewed  COMPREHENSIVE METABOLIC PANEL - Abnormal; Notable for the following components:      Result Value   CO2 16 (*)    BUN 73 (*)    Creatinine, Ser 6.08 (*)    Calcium 11.0 (*)    Albumin 3.4 (*)    Alkaline Phosphatase 128 (*)    GFR, Estimated 7 (*)    All other components within normal limits  CBC WITH DIFFERENTIAL/PLATELET - Abnormal; Notable for the following components:   RBC 3.15 (*)    Hemoglobin 9.2 (*)    HCT 29.0 (*)    RDW 17.4 (*)    nRBC 0.8 (*)    All other components within normal limits  BRAIN NATRIURETIC PEPTIDE - Abnormal; Notable for the following components:   B Natriuretic Peptide 113.4 (*)    All other components within normal limits  TROPONIN I (HIGH SENSITIVITY) - Abnormal; Notable for the following components:   Troponin I (High Sensitivity) 18 (*)    All other components within normal limits  RESP PANEL BY RT-PCR (RSV, FLU A&B, COVID)  [id number]  TROPONIN I (HIGH SENSITIVITY)    EKG EKG Interpretation Date/Time:  Saturday January 28 2023 14:15:32 EST Ventricular Rate:  77 PR Interval:  176 QRS Duration:  90 QT Interval:  393 QTC Calculation: 445 R Axis:   75  Text Interpretation: Sinus rhythm Confirmed by Samantha Crimes [phone]) on 01/28/2023 3:12:38 PM  Radiology DG Chest 2 View Result Date: 01/28/2023 CLINICAL DATA:  Productive cough for 1 month. EXAM: CHEST - 2 VIEW  COMPARISON:  09/18/2022 FINDINGS: The heart size and mediastinal contours are within normal limits. Both lungs are clear. Small hiatal hernia again noted. IMPRESSION: No active cardiopulmonary disease. Small hiatal hernia. Electronically Signed   By: Danae Orleans M.D.   On: 01/28/2023 12:22    Procedures Procedures    Medications Ordered in ED Medications  albuterol (VENTOLIN HFA) 108 (90 Base) MCG/ACT inhaler 4 puff (4 puffs Inhalation Given 01/28/23  1320)    ED Course/ Medical Decision Making/ A&P                                 Medical Decision Making 67 year old female presenting for a cough and nasal congestion.  She also endorsed some shortness of breath yesterday that is now resolved and reported diarrhea.  She is well-appearing here in the emergency department and her vitals are stable.  She is denying any chest discomfort and has no evidence of respiratory distress on physical exam. Differential includes pneumonia, viral URI, CHF, pleural effusions, and others.  She has known stage V kidney failure and follows very closely with her transplant team and nephrologist.  Lab work and imaging ordered to rule any acute process.  Lab work does show elevated creatinine at 6.08.  When compared with her priors, her creatinine has improved since her last check 4 days ago.  She reports that she is aware of her creatinine and has an appointment with her nephrologist this week.  She is on the list for transplant at this time.  Her labs over the last few months also show elevated creatinine consistent with today's level.  Patient has been seen by vascular to have her dialysis catheter placed and they plan on starting dialysis right after.  Her kidney failure is chronic and this does not need to be initiated today.  BNP mildly elevated likely secondary to her kidney failure.  Chest x-ray shows no focal pneumonia, interstitial fluid, or pleural effusions.  Troponin x 2 within normal limits.  Hemoglobin  at 9.3 and consistent with priors.  She did have improvement after albuterol puffs.  Shared decision making was done and it was decided to have her return home at this time.  Family's main concern was for any pneumonia and will be explained that there is no evidence of multifocal pneumonia on her chest x-ray today, however if she were to worsen she needs to be reevaluated.  Patient felt comfortable following up with the nephrologist this week regarding the creatinine.  Tricked return precautions were given and all questions answered.  Amount and/or Complexity of Data Reviewed Labs: ordered. Radiology: ordered.  Risk Prescription drug management.    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

## 2023-02-03 DIAGNOSIS — I120 Hypertensive chronic kidney disease with stage 5 chronic kidney disease or end stage renal disease: Secondary | ICD-10-CM | POA: Diagnosis not present

## 2023-02-03 DIAGNOSIS — D631 Anemia in chronic kidney disease: Secondary | ICD-10-CM | POA: Diagnosis not present

## 2023-02-03 DIAGNOSIS — M109 Gout, unspecified: Secondary | ICD-10-CM | POA: Diagnosis not present

## 2023-02-03 DIAGNOSIS — N185 Chronic kidney disease, stage 5: Secondary | ICD-10-CM | POA: Diagnosis not present

## 2023-02-03 DIAGNOSIS — N2581 Secondary hyperparathyroidism of renal origin: Secondary | ICD-10-CM | POA: Diagnosis not present

## 2023-02-03 DIAGNOSIS — K219 Gastro-esophageal reflux disease without esophagitis: Secondary | ICD-10-CM | POA: Diagnosis not present

## 2023-02-03 DIAGNOSIS — M3214 Glomerular disease in systemic lupus erythematosus: Secondary | ICD-10-CM | POA: Diagnosis not present

## 2023-02-03 DIAGNOSIS — I10 Essential (primary) hypertension: Secondary | ICD-10-CM | POA: Diagnosis not present

## 2023-02-03 DIAGNOSIS — M25569 Pain in unspecified knee: Secondary | ICD-10-CM | POA: Diagnosis not present

## 2023-02-08 DIAGNOSIS — I071 Rheumatic tricuspid insufficiency: Secondary | ICD-10-CM | POA: Diagnosis not present

## 2023-02-08 DIAGNOSIS — N185 Chronic kidney disease, stage 5: Secondary | ICD-10-CM | POA: Diagnosis not present

## 2023-02-08 DIAGNOSIS — Z7682 Awaiting organ transplant status: Secondary | ICD-10-CM | POA: Diagnosis not present

## 2023-02-08 DIAGNOSIS — I120 Hypertensive chronic kidney disease with stage 5 chronic kidney disease or end stage renal disease: Secondary | ICD-10-CM | POA: Diagnosis not present

## 2023-02-08 DIAGNOSIS — Z01810 Encounter for preprocedural cardiovascular examination: Secondary | ICD-10-CM | POA: Diagnosis not present

## 2023-02-08 DIAGNOSIS — R0602 Shortness of breath: Secondary | ICD-10-CM | POA: Diagnosis not present

## 2023-02-20 DIAGNOSIS — M329 Systemic lupus erythematosus, unspecified: Secondary | ICD-10-CM | POA: Diagnosis not present

## 2023-02-20 DIAGNOSIS — H40003 Preglaucoma, unspecified, bilateral: Secondary | ICD-10-CM | POA: Diagnosis not present

## 2023-02-20 DIAGNOSIS — Z79899 Other long term (current) drug therapy: Secondary | ICD-10-CM | POA: Diagnosis not present

## 2023-02-20 DIAGNOSIS — H2513 Age-related nuclear cataract, bilateral: Secondary | ICD-10-CM | POA: Diagnosis not present

## 2023-02-21 ENCOUNTER — Ambulatory Visit (HOSPITAL_COMMUNITY)
Admission: RE | Admit: 2023-02-21 | Discharge: 2023-02-21 | Disposition: A | Discharge status: Home / Self Care | Payer: Medicare PPO | Source: Ambulatory Visit | Attending: Nephrology | Admitting: Nephrology

## 2023-02-21 VITALS — BP 103/64 | HR 61 | Temp 98.1°F | Resp 20

## 2023-02-21 DIAGNOSIS — D631 Anemia in chronic kidney disease: Secondary | ICD-10-CM | POA: Insufficient documentation

## 2023-02-21 DIAGNOSIS — N189 Chronic kidney disease, unspecified: Secondary | ICD-10-CM | POA: Insufficient documentation

## 2023-02-21 LAB — RENAL FUNCTION PANEL
Albumin: 3.2 g/dL — ABNORMAL LOW (ref 3.5–5.0)
Anion gap: 12 (ref 5–15)
BUN: 84 mg/dL — ABNORMAL HIGH (ref 8–23)
CO2: 15 mmol/L — ABNORMAL LOW (ref 22–32)
Calcium: 10.3 mg/dL (ref 8.9–10.3)
Chloride: 109 mmol/L (ref 98–111)
Creatinine, Ser: 7.04 mg/dL — ABNORMAL HIGH (ref 0.44–1.00)
GFR, Estimated: 6 mL/min — ABNORMAL LOW (ref >60)
Glucose, Bld: 110 mg/dL — ABNORMAL HIGH (ref 70–99)
Phosphorus: 6.4 mg/dL — ABNORMAL HIGH (ref 2.5–4.6)
Potassium: 4.7 mmol/L (ref 3.5–5.1)
Sodium: 136 mmol/L (ref 135–145)

## 2023-02-21 LAB — IRON AND TIBC
Iron: 63 ug/dL (ref 28–170)
Saturation Ratios: 27 % (ref 10.4–31.8)
TIBC: 231 ug/dL — ABNORMAL LOW (ref 250–450)
UIBC: 168 ug/dL

## 2023-02-21 LAB — URIC ACID: Uric Acid, Serum: 3.7 mg/dL (ref 2.5–7.1)

## 2023-02-21 LAB — FERRITIN: Ferritin: 121 ng/mL (ref 11–307)

## 2023-02-21 LAB — POCT HEMOGLOBIN-HEMACUE: Hemoglobin: 8.5 g/dL — ABNORMAL LOW (ref 12.0–15.0)

## 2023-02-21 MED ORDER — EPOETIN ALFA-EPBX 40000 UNIT/ML IJ SOLN
INTRAMUSCULAR | Status: AC
  Administered 2023-02-21: 40000 [IU]
  Filled 2023-02-21: qty 1

## 2023-02-22 ENCOUNTER — Telehealth: Payer: Self-pay

## 2023-02-22 LAB — PTH, INTACT AND CALCIUM
Calcium, Total (PTH): 9.9 mg/dL (ref 8.7–10.3)
PTH: 882 pg/mL — ABNORMAL HIGH (ref 15–65)

## 2023-02-22 NOTE — Telephone Encounter (Signed)
Patient left message on triage nurse line with unclear purpose.  Left message for her to return call.

## 2023-02-23 ENCOUNTER — Other Ambulatory Visit: Payer: Self-pay

## 2023-02-23 DIAGNOSIS — N185 Chronic kidney disease, stage 5: Secondary | ICD-10-CM

## 2023-03-01 DIAGNOSIS — S82101K Unspecified fracture of upper end of right tibia, subsequent encounter for closed fracture with nonunion: Secondary | ICD-10-CM | POA: Diagnosis not present

## 2023-03-01 DIAGNOSIS — T8484XD Pain due to internal orthopedic prosthetic devices, implants and grafts, subsequent encounter: Secondary | ICD-10-CM | POA: Diagnosis not present

## 2023-03-02 ENCOUNTER — Other Ambulatory Visit: Payer: Self-pay | Admitting: Surgery

## 2023-03-02 DIAGNOSIS — N2581 Secondary hyperparathyroidism of renal origin: Secondary | ICD-10-CM

## 2023-03-02 DIAGNOSIS — N185 Chronic kidney disease, stage 5: Secondary | ICD-10-CM | POA: Diagnosis not present

## 2023-03-02 NOTE — Anesthesia Preprocedure Evaluation (Signed)
Anesthesia Evaluation    Airway        Dental   Pulmonary           Cardiovascular hypertension,      Neuro/Psych    GI/Hepatic   Endo/Other    Renal/GU      Musculoskeletal   Abdominal   Peds  Hematology   Anesthesia Other Findings   Reproductive/Obstetrics                              Anesthesia Physical Anesthesia Plan  ASA:   Anesthesia Plan:    Post-op Pain Management:    Induction:   PONV Risk Score and Plan:   Airway Management Planned:   Additional Equipment:   Intra-op Plan:   Post-operative Plan:   Informed Consent:   Plan Discussed with:   Anesthesia Plan Comments: (TTE 02/08/23: CONCLUSION -------------------------------------------------------------------------------  NORMAL LEFT VENTRICULAR SYSTOLIC FUNCTION WITH MILD LVH  ESTIMATED EF: 55%, CALC EF(3D): 62%  NORMAL LA PRESSURES WITH NORMAL DIASTOLIC FUNCTION  NORMAL RIGHT VENTRICULAR SYSTOLIC FUNCTION  VALVULAR REGURGITATION: No AR, TRIVIAL MR, TRIVIAL PR, MODERATE TR  NO VALVULAR STENOSIS   WITH NO RESPIRATORY FLOW VARIATION  PHYSICIAN IMPRESSIONS --------------------------------------------------------------------  MILD TO MODERATE TR    Cardiac PET 08/01/22: Conclusions:   1. Myocardial perfusion is normal without evidence of ischemia or infarct.   2. Global  myocardial blood flow reserve is abnormal suggestive of microvascular dysfunction.   3. Normal left ventricular systolic function.  )         Anesthesia Quick Evaluation

## 2023-03-03 ENCOUNTER — Ambulatory Visit (HOSPITAL_COMMUNITY): Admission: RE | Admit: 2023-03-03 | Payer: Medicare PPO | Source: Home / Self Care | Admitting: Vascular Surgery

## 2023-03-03 ENCOUNTER — Encounter (HOSPITAL_COMMUNITY): Payer: Self-pay | Admitting: Physician Assistant

## 2023-03-03 ENCOUNTER — Encounter (HOSPITAL_COMMUNITY): Admission: RE | Payer: Self-pay | Source: Home / Self Care

## 2023-03-03 SURGERY — ARTERIOVENOUS (AV) FISTULA CREATION
Anesthesia: Monitor Anesthesia Care | Laterality: Left

## 2023-03-07 ENCOUNTER — Encounter (HOSPITAL_COMMUNITY)
Admission: RE | Disposition: A | Discharge status: Home / Self Care | Payer: Self-pay | Source: Home / Self Care | Attending: Internal Medicine

## 2023-03-07 ENCOUNTER — Other Ambulatory Visit: Payer: Self-pay

## 2023-03-07 ENCOUNTER — Ambulatory Visit (HOSPITAL_COMMUNITY)
Admission: RE | Admit: 2023-03-07 | Discharge: 2023-03-07 | Disposition: A | Discharge status: Home / Self Care | Payer: Medicare PPO | Attending: Internal Medicine | Admitting: Internal Medicine

## 2023-03-07 DIAGNOSIS — M3214 Glomerular disease in systemic lupus erythematosus: Secondary | ICD-10-CM | POA: Insufficient documentation

## 2023-03-07 DIAGNOSIS — E785 Hyperlipidemia, unspecified: Secondary | ICD-10-CM | POA: Diagnosis not present

## 2023-03-07 DIAGNOSIS — G4733 Obstructive sleep apnea (adult) (pediatric): Secondary | ICD-10-CM | POA: Diagnosis not present

## 2023-03-07 DIAGNOSIS — N186 End stage renal disease: Secondary | ICD-10-CM | POA: Insufficient documentation

## 2023-03-07 DIAGNOSIS — I120 Hypertensive chronic kidney disease with stage 5 chronic kidney disease or end stage renal disease: Secondary | ICD-10-CM | POA: Diagnosis not present

## 2023-03-07 DIAGNOSIS — M109 Gout, unspecified: Secondary | ICD-10-CM | POA: Diagnosis not present

## 2023-03-07 DIAGNOSIS — Z992 Dependence on renal dialysis: Secondary | ICD-10-CM | POA: Diagnosis not present

## 2023-03-07 HISTORY — PX: DIALYSIS/PERMA CATHETER INSERTION: CATH118288

## 2023-03-07 SURGERY — DIALYSIS/PERMA CATHETER INSERTION
Anesthesia: LOCAL

## 2023-03-07 MED ORDER — HEPARIN (PORCINE) IN NACL 1000-0.9 UT/500ML-% IV SOLN
INTRAVENOUS | Status: DC | PRN
  Administered 2023-03-07: 500 mL

## 2023-03-07 MED ORDER — LIDOCAINE HCL (PF) 1 % IJ SOLN
INTRAMUSCULAR | Status: DC | PRN
  Administered 2023-03-07: 10 mL via SUBCUTANEOUS

## 2023-03-07 MED ORDER — HEPARIN SODIUM (PORCINE) 1000 UNIT/ML IJ SOLN
INTRAMUSCULAR | Status: AC
  Filled 2023-03-07: qty 10

## 2023-03-07 MED ORDER — HEPARIN SODIUM (PORCINE) 1000 UNIT/ML IJ SOLN
INTRAMUSCULAR | Status: DC | PRN
  Administered 2023-03-07: 4200 [IU] via INTRAVENOUS

## 2023-03-07 MED ORDER — SODIUM CHLORIDE 0.9 % IV SOLN
INTRAVENOUS | Status: DC
Start: 2023-03-07 — End: 2023-03-07

## 2023-03-07 MED ORDER — ACETAMINOPHEN 325 MG PO TABS: 650.0000 mg | ORAL_TABLET | ORAL | Status: DC | PRN

## 2023-03-07 MED ORDER — MIDAZOLAM HCL 2 MG/2ML IJ SOLN
INTRAMUSCULAR | Status: AC
  Filled 2023-03-07: qty 2

## 2023-03-07 MED ORDER — FENTANYL CITRATE (PF) 100 MCG/2ML IJ SOLN
INTRAMUSCULAR | Status: AC
  Filled 2023-03-07: qty 2

## 2023-03-07 MED ORDER — MIDAZOLAM HCL 2 MG/2ML IJ SOLN
INTRAMUSCULAR | Status: DC | PRN
  Administered 2023-03-07: 1 mg via INTRAVENOUS

## 2023-03-07 MED ORDER — IODIXANOL 320 MG/ML IV SOLN
INTRAVENOUS | Status: DC | PRN
  Administered 2023-03-07: 3 mL via INTRAVENOUS

## 2023-03-07 MED ORDER — ONDANSETRON HCL 4 MG/2ML IJ SOLN: 4.0000 mg | Freq: Four times a day (QID) | INTRAMUSCULAR | Status: DC | PRN

## 2023-03-07 MED ORDER — FENTANYL CITRATE (PF) 100 MCG/2ML IJ SOLN
INTRAMUSCULAR | Status: DC | PRN
  Administered 2023-03-07: 50 ug via INTRAVENOUS

## 2023-03-07 MED ORDER — LIDOCAINE HCL (PF) 1 % IJ SOLN
INTRAMUSCULAR | Status: AC
  Filled 2023-03-07: qty 30

## 2023-03-07 SURGICAL SUPPLY — 6 items
CATH PALINDROME 14.5FR 28 (CATHETERS) IMPLANT
COVER DOME SNAP 22 D (MISCELLANEOUS) IMPLANT
GUIDEWIRE ZIPWIRE 035/150 ANGL (WIRE) IMPLANT
SHEATH PROBE COVER 6X72 (BAG) IMPLANT
TRAY PV CATH (CUSTOM PROCEDURE TRAY) ×1 IMPLANT
WIRE MICRO SET SILHO 5FR 7 (SHEATH) IMPLANT

## 2023-03-07 NOTE — Op Note (Signed)
 Patient presents for tunneled catheter insertion to initiate dialysis; ultrasound shows a good left  IJ. The decision was made to place a LIJ tunneled dialysis catheter.   Summary:  1) Successful placement of a new 28 cm cuff to tip hemodialysis catheter (Palindrome) in the left internal jugular vein with the tip in the RA/IVC junction.  2) Recommendations to the dialysis unit TO NOT USE HEPARIN  FOR THE FIRST 24 HOURS.  Description of procedure: The left neck, chest and the catheter were prepped and draped in the usual sterile fashion.  Local anesthesia was provided by injecting lidocaine  1% at the neck site overlying the desired venotomy site. Using real time ultrasound guidance, I was able to cannulate the LIJ and advance the guidewire easily into the central circulation. The wire was then manipulated and advanced into the abdominal IVC. I then blunt dissected the tissue around the 5 Fr stylet until it was freely mobile. Then, after injecting local anesthesia into the desired track and exit site I made a small incision at the exit site and tunneled a 28 cm cuff to tip Palindrome dialysis catheter, pulling it out at the venotomy site. Sequential dilation with 12, 14, and finally the peelaway sheath were done with real time fluoroscopic guidance ensuring that we were straight always lined with the wire.   Then, I inserted the catheter over the wire and through the sheath. After removing the peelaway sheath, I adjusted the catheter until the tip of the catheter was positioned in the right atrium of the heart. The cuff of the catheter was positioned in the subcutaneous tunnel with the cuff approximately 2 cm from the exit site.   Both limbs of the catheter were aspirated and flushed with excellent flow noted. Both limbs of the catheter were locked with heparin  and sterile caps placed. The hub of the catheter was sutured to the chest wall with 2-0 nylon suture.   The neck incision was closed with a vertical  mattress 3-0 plain gut sutures and a purse-string 3-0 plain gut was placed at the tunnel exit site. 2-0 loose nylon sutures secured the hub to the chest wall.                         Sterile dressings were placed, and the patient returned to recovery in stable condition.  Sedation: 1 mg Versed , 50 mcg Fentanyl  Sedation time: 10 minutes  Contrast. 4 mL  Monitoring: Because of the patient's comorbid conditions and sedation during the procedure, continuous EKG monitoring and O2 saturation monitoring was performed throughout the procedure by the RN. There were no abnormal arrhythmias encountered.  Complications: None.  Diagnoses:   N18.6 End stage renal disease Z99.2 Dependence on renal dialysis  Procedures Coding:  36558 Tunneled catheter insertion [id number] Ultrasound guidance 22998  Fluoroscopy guidance for catheter insertion Q9962 Contrast  Recommendations: Remove the suture in 3 weeks. 2.   Report any blood flow problems to [facility].  Discharge: The patient was discharged home in stable condition. The patient was given education regarding the care of the catheter and specific instructions in case of any problems.

## 2023-03-07 NOTE — Discharge Instructions (Signed)

## 2023-03-07 NOTE — H&P (Signed)
 [facility]  Vascular Access Procedure H&P  Reason for Consultation: placement of dialysis catheter Requesting Provider: Dr. Marlee  HPI: Colleen Obrien is an 68 y.o. 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 Marlee for placement of dialysis catheter to initiate dialysis.   Patient presents for HD catheter insertion to initiate dialysis.  She explains an AVG surgery was cancelled recently as she may be eligible for a fairly imminent transplant at [facility].  She has a remote h/o port but doesn't recall location - appears a small scar on L chest but difficult to see.  She is not on anticoagulation, has no h/o pacer/ICD.  She feels fine today.    PMH: Past Medical History:  Diagnosis Date   Anemia in chronic kidney disease (CKD)    Arthritis    Asthma    Blood dyscrasia    systemic lupus   Chronic renal disease, stage 5, glomerular filtration rate less than or equal to 15 mL/min/1.73 square meter (HCC)    Dyspnea    GERD (gastroesophageal reflux disease)    Gout    Hypertension    Lupus    Narcolepsy    Osteoporosis 05/05/2022   Pneumonia    x 1   Sleep apnea    tries to use CPAP nightly as of 04/29/22   Walker as Engineer, Water dependent    when going outside home   PSH: Past Surgical History:  Procedure Laterality Date   CESAREAN SECTION     x 2   COLONOSCOPY     ganglion cyst removed Right    arm   TIBIA IM NAIL INSERTION Right 05/03/2022   Procedure: INTRAMEDULLARY (IM) NAIL TIBIAL;  Surgeon: Celena Sharper, MD;  Location: [facility] OR;  Service: Orthopedics;  Laterality: Right;   TOTAL HIP ARTHROPLASTY Left 08/26/2019   Procedure: LEFT TOTAL HIP ARTHROPLASTY ANTERIOR APPROACH;  Surgeon: Jerri Kay HERO, MD;  Location: [facility] OR;  Service: Orthopedics;  Laterality: Left;   TUBAL LIGATION      Past Medical History:  Diagnosis Date   Anemia in chronic kidney disease (CKD)     Arthritis    Asthma    Blood dyscrasia    systemic lupus   Chronic renal disease, stage 5, glomerular filtration rate less than or equal to 15 mL/min/1.73 square meter (HCC)    Dyspnea    GERD (gastroesophageal reflux disease)    Gout    Hypertension    Lupus    Narcolepsy    Osteoporosis 05/05/2022   Pneumonia    x 1   Sleep apnea    tries to use CPAP nightly as of 04/29/22   Walker as Engineer, Water dependent    when going outside home    Medications:  I have reviewed the patient's current medications.  Medications Prior to Admission  Medication Sig Dispense Refill   acetaminophen  (TYLENOL ) 500 MG tablet Take 500 mg by mouth every 6 (six) hours as needed (for pain.).     albuterol  (PROVENTIL  HFA;VENTOLIN  HFA) 108 (90 BASE) MCG/ACT inhaler Inhale 2 puffs into the lungs every 6 (six) hours as needed for wheezing or shortness of breath.     allopurinol  (ZYLOPRIM ) 300 MG tablet Take 300 mg by mouth daily.     budesonide -formoterol  (SYMBICORT) 80-4.5 MCG/ACT inhaler Inhale 2 puffs into the lungs 2 (two) times daily as needed (asthma).  cyanocobalamin 1000 MCG tablet Take 1,000 mcg by mouth daily.     felodipine  (PLENDIL ) 10 MG 24 hr tablet Take 10 mg by mouth daily.     ferrous sulfate 325 (65 FE) MG tablet Take 325 mg by mouth every other day.     furosemide  (LASIX ) 40 MG tablet Take 1 tablet (40 mg total) by mouth daily as needed (weight gain of 3 lbs in 1 day or 5 lbs over 1 week.).     hydroxychloroquine  (PLAQUENIL ) 200 MG tablet Take 200 mg by mouth daily.      labetalol  (NORMODYNE ) 300 MG tablet Take 300 mg by mouth 2 (two) times daily.     methocarbamol  (ROBAXIN ) 500 MG tablet Take 0.5 tablets (250 mg total) by mouth every 8 (eight) hours as needed for muscle spasms. 10 tablet 0   pantoprazole  (PROTONIX ) 40 MG tablet Take 40 mg by mouth at bedtime as needed (acid reflux).      ALLERGIES:   Allergies  Allergen Reactions   Erythromycin Nausea And Vomiting    Levofloxacin     Causes muscle pain    Sulfa Antibiotics Hives    FAM HX: Family History  Problem Relation Age of Onset   Hypertension Mother    Diabetes Mother    Dementia Father    Hypertension Father    Hyperlipidemia Father    Diabetes Paternal Grandmother    Alcohol abuse Paternal Grandfather     Social History:   reports that she has never smoked. She has never used smokeless tobacco. She reports that she does not drink alcohol and does not use drugs.  ROS: 12 system relevant ROS as per HPI  There were no vitals taken for this visit. PHYSICAL EXAM: Gen: well appearing  Eyes:  EOMI, glasses ENT: class 3 airway Neck:R carotid quite hyperdynamic even visually, LIJ good size and patent by US  CV: HTN noted, NSR Lungs: clear Extr: no edema Neuro: conversant    No results found for this or any previous visit (from the past 48 hours).  No results found.  Assessment/PlanShirley J Obrien is an 68 y.o. 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 Marlee for placement of dialysis catheter to initiate dialysis.   **CKD 5 now ESRD:  Pt presenting for tunneled dialysis catheter insertion.  Based on US  assessment LIJ will be adequate for insertion.  Will use local lidocaine  and upon access of vein administer IV sedation.  Discussed need for no shower/immersion and dialysis unit staff will provide dressing changes and care to catheter site. She is aware of risk/benefit/alternatives and elects to proceed.    Colleen Obrien 03/07/2023, 12:08 PM

## 2023-03-08 ENCOUNTER — Encounter (HOSPITAL_COMMUNITY): Payer: Self-pay | Admitting: Internal Medicine

## 2023-03-08 DIAGNOSIS — E559 Vitamin D deficiency, unspecified: Secondary | ICD-10-CM | POA: Diagnosis not present

## 2023-03-08 DIAGNOSIS — N185 Chronic kidney disease, stage 5: Secondary | ICD-10-CM | POA: Diagnosis not present

## 2023-03-08 DIAGNOSIS — M8080XD Other osteoporosis with current pathological fracture, unspecified site, subsequent encounter for fracture with routine healing: Secondary | ICD-10-CM | POA: Diagnosis not present

## 2023-03-08 NOTE — Progress Notes (Signed)
 Reason for follow up: osteoporosis.  Referring Provider: Lavanda Ezzard Forte  Primary Care Provider: Claudene Alberta DASEN, MD   Assessment/Plan:  1. Other osteoporosis with current pathological fracture with routine healing, subsequent encounter 2. Vitamin D  deficiency  -with previous fragility fracture: MRI 12/21/21 Nondisplaced insufficiency/stress fracture of the proximal right  tibial metadiaphysis with surrounding bone marrow edema and mild periostitis. Incomplete nondisplaced insufficiency/stress fracture of the proximal medial left tibial metadiaphysis with mild surrounding bone marrow edema, in setting of no significant trauma. These were not seen on XR from 11/12/21.  Right Shoulder XR 02/17/21 Acute, impacted, minimally-displaced surgical neck fracture, reports this fx happened in setting of falling from standing height. Reports metatarsal fx stress fx 5-10 yrs ago without significant trauma.  - We discussed the differential diagnosis of Osteoporosis including menopause, vitamin d  deficiency, hyperparathyroidism and medications (ex. glucocorticoids). Risk factors for osteoporosis identified in this patient: Early Menopause without HRT, SLE, Glucocorticoids, Plaquenil , PPIs, Vitamin d  deficiency, CKD stage 4-5 - additional work up for secondary causes of osteoporosis: normal calcium, phosphorus, alk phos, TSH. No clinical signs of Cushing's. - Reports started Denosumab  with PCP 2-3 years ago. Has had 2-3 mo gap in therapy in 2022 possibly, she is not sure about time, but reports at time of humeral fx 01/2021 denies any gaps in tx then.  - we discussed potential reasons for fragility fractures during therapy with Prolia  including vitamin d  deficiency, gaps in therapy, PPIs, and underlying non-modifiable factors (SLE, Plaquenil ). We also discussed other ddx to consider in someone with CKD and fractures mainly Adynamic Bone Disease, which can only be confirmed with bone biopsy, but given high  PTH this is less likely, patients with Adynamic Bone disease tend to have inappropriately normal PTH in setting of CKD.  We also discussed possibility of tertiary hyperpara, seems she has not developed this yet as her calcium remains normal.  - we discussed her limited options for osteoporosis tx given CKD which limits use of bisphosphonates and PTH analogs. We discussed possible option of Romosozumab which is approved for 1 yr only, after which we would left out with returning to Prolia . She will consider this if continues to have recurrent fx while on Prolia  despite normal vitamin d  and adequate calcium intake, and no missed dosis.  - today 03/08/23- she has not continued Prolia , she reports her nephrologist stopped it due to hypercalcemia and concern for hyperparathyroidism. Denies new fractures since last visit. Recent labs show calcium ranging 11-10.3, with Alb 3.2, Alk phos 128, phos 6.4, Cr 7.0, PTH 882. Nephrology stopped vit d recently. She is seeing surgeon at [facility] considering parathyroidectomy in preparation for possible renal transplant in the future.  Plan: - we discussed treatment options again today, given her comorbidities and renal function, her options are Evenity followed by Prolia  or resuming Prolia . We discussed benefits and side effects.  - We discussed Prolia  and calcium, and risk of hypocalcemia specially if vitamin d  < 30. Encouraged to have vitamin d  checked and kept > 30 if resuming Prolia . It is ok to continue Prolia  if hypercalcemic.  - Patient would like to discuss these options with her PCP and Nephrologist and will get back to me on their decision.  - encouraged weight bearing exercises as tolerated.   - monitor DXA scan every 2 years (~ 08/2023) - avoid PPIs as much as possible - Continues to follow with nephrologist Dr. Marlee. On transplant list at [facility].    All questions were answered and patient  agrees with plan.   Return if symptoms worsen or fail to  improve.   Thank you for referring your patient to our endocrine clinic for evaluation. Please do not hesitate to contact me with any questions.   Sheree Manes, MD [facility] End[facility] Phone [phone] Fax [phone]  Subjective:     Colleen Obrien is a 68 y.o. 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 Last seen on 02/04/22      Pertinent Bone health History: Fracture: MRI 12/21/21 Nondisplaced insufficiency/stress fracture of the proximal right  tibial metadiaphysis with surrounding bone marrow edema and mild periostitis. Incomplete nondisplaced insufficiency/stress fracture of the proximal medial left tibial metadiaphysis with mild surrounding bone marrow edema, in setting of no significant trauma. These were not seen on XR from 11/12/21.  Right Shoulder XR 02/17/21 Acute, impacted, minimally-displaced surgical neck fracture, reports this fx happened in setting of falling from standing height. Reports metatarsal fx stress fx 5-10 yrs ago without significant trauma.  Fracture in Family members: denied. Mother living does not have osteoporosis Previous Medication use:  Dx 2009, tired fosamax did not tolerate due to GI side effects. She was off therapy for a while. Reports started Denosumab  with PCP 2-3 years ago. Has had 2-3 mo gap in therapy in 2022, she is not sure about time, but reports at time of humeral fx 01/2021 denies any gaps in tx then.  Hyperthyroidism: denied Menopause: in 1999, around 68 yo. Took HRT for 6 months then and stopped due to concern from breast cancer.  Vitamin D : Vitamin D  25k weekly for about 1-2 mo Calcium: used to take supplements, nephrologist stopped around 07/2021  Kidney Stones: denied Steroid use: previously on 5 yrs of therapy,  not currently Smoking: denied Alcohol:  denied Malabsorption: no chronic diarrhea History of early loss of primary teeth: denied Anemia: denied Falls in the past month: denied Exercise: not doing therapy currently, walks with cane and walker at home.  Vegan/Vegetarian diet: none Rheumatoid Arthritis (or other autoimmune conditions): SLE, plaquenil   Dental work needed?: denies  Family History:  Denies history of fractures, hypercalcemia or kidney stones in the family. Denies family history of pituitary, hypothalamic, thyroid , parathyroid , adrenal or pancreatic tumors.   Social history:   Denies smoking, alcohol and illicit drugs use.    Past Medical History:  Diagnosis Date  . Asthma   . Difficult intravenous access   . Ganglion cyst   . Gout   . Hypertension   . Lupus (CMS-HCC)   . Lupus (CMS-HCC)   . Lupus nephritis (CMS-HCC)   . Pneumonia   . Pulmonary HTN (CMS-HCC)   . Seizures (CMS-HCC) 1980's  . Shortness of breath   . Sleep apnea   . TTP (thrombotic thrombocytopenic purpura) (CMS-HCC)    Allergies  Allergen Reactions  . Sulfa (Sulfonamide Antibiotics) Hives  . Levaquin [Levofloxacin]     Causes muscle pain    . Other Other (See Comments)    Nuts, causes sneezing and coughing  . Doxycycline  Rash and Other (See Comments)  . Erythromycin Nausea Only   Social History   Socioeconomic History  . Marital status: Married  Tobacco Use  . Smoking status: Never  . Smokeless tobacco: Never  Substance and Sexual Activity  . Alcohol use:  No    Alcohol/week: 0.0 standard drinks of alcohol  . Drug use: No   Social Drivers of Health   Food Insecurity: No Food Insecurity (09/19/2022)   Received from [employer]   Hunger Vital Sign   . Worried About Programme Researcher, Broadcasting/film/video in the Last Year: Never true   . Ran Out of Food in the Last Year: Never true  Transportation Needs: No Transportation Needs (09/19/2022)   Received from [facility] - Transportation   .  Lack of Transportation (Medical): No   . Lack of Transportation (Non-Medical): No    Past Surgical History:  Procedure Laterality Date  . BREAST BIOPSY Left   . CESAREAN SECTION     2x  . GANGLION CYST EXCISION    . HAND SURGERY    . PR REVISE MEDIAN N/CARPAL TUNNEL SURG Right 01/13/2014   Procedure: NEUROPLASTY AND/OR TRANSPOSITION; MEDIAN NERVE AT CARPAL TUNNEL;  Surgeon: Delon CHRISTELLA Gander, MD;  [facility];  Service: Orthopedics     Current Outpatient Medications:  .  acetaminophen  (TYLENOL ) 500 MG tablet, Take 2 tablets (1,000 mg total) by mouth two (2) times a day., Disp: , Rfl:  .  albuterol  (PROVENTIL  HFA;VENTOLIN  HFA) 90 mcg/actuation inhaler, Take 2 puffs by mouth daily as needed., Disp: , Rfl:  .  allopurinol  (ZYLOPRIM ) 300 MG tablet, Take 1 tablet (300 mg total) by mouth daily., Disp: 90 tablet, Rfl: 2 .  budesonide -formoterol  (SYMBICORT) 160-4.5 mcg/actuation inhaler, Inhale 1 puff two (2) times a day as needed. (Patient taking differently: Inhale 1 puff  in the morning and 1 puff in the evening.), Disp: 1 Inhaler, Rfl: 11 .  clobetasoL (TEMOVATE) 0.05 % ointment, Apply topically Two (2) times a day., Disp: , Rfl:  .  denosumab  (PROLIA ) 60 mg/mL Syrg, Inject 1 mL (60 mg total) under the skin., Disp: , Rfl:  .  dextroamphetamine -amphetamine  (ADDERALL) 5 mg tablet, 2 TABLETS EVERY MORNING AND ONE IN THE AFTERNOON IF NEEDED TWICE A DAY ORALLY 30 DAYS, Disp: , Rfl:  .  diclofenac sodium (VOLTAREN) 1 % gel, Apply 2-4 g topically four (4) times a day., Disp: 100 g, Rfl: 5 .  docusate sodium  (COLACE) 100 MG capsule, Take 1 capsule (100 mg total) by mouth., Disp: , Rfl:  .  ergocalciferol , vitamin D2, (VITAMIN D2 ORAL), Take 25,000 Units by mouth every seven (7) days., Disp: , Rfl:  .  felodipine  (PLENDIL ) 5 MG 24 hr tablet, Take 1 tablet (5 mg total) by mouth daily., Disp: , Rfl:  .  fluticasone  propionate (FLONASE) 50 mcg/actuation nasal spray, 1 spray by Each Nare route  daily as needed., Disp: , Rfl:  .  furosemide  (LASIX ) 80 MG tablet, Take 1 tablet (80 mg total) by mouth daily., Disp: , Rfl:  .  HYDROcodone -acetaminophen  (NORCO) 5-325 mg per tablet, Take 1-2 tablets by mouth., Disp: , Rfl:  .  hydroxychloroquine  (PLAQUENIL ) 200 mg tablet, Take 1 tablet (200 mg total) by mouth daily., Disp: 90 tablet, Rfl: 3 .  labetaloL  (NORMODYNE ) 100 MG tablet, Take 1 tablet (100 mg total) by mouth two (2) times a day., Disp: , Rfl:  .  lidocaine  (LIDODERM ) 5 % patch, Place 1 patch on the skin every twelve (12) hours. Apply to affected area for 12 hours only each day (then remove patch), Disp: 30 patch, Rfl: 5 .  methocarbamol  (ROBAXIN ) 500 MG tablet, Take 1 tablet (500 mg total) by mouth., Disp: , Rfl:  .  pantoprazole  (PROTONIX ) 20 MG tablet, Take 1 tablet (20 mg total) by mouth daily. Please call [phone] to schedule follow up for additional refills., Disp: 90 tablet, Rfl: 0 .  valACYclovir (VALTREX) 1000 MG tablet, Take by mouth., Disp: , Rfl:    Review of Systems A 12 point review of systems was otherwise negative except as noted in the HPI.    Objective:    BP 168/105   Pulse 85   Wt 76.9 kg (169 lb 8 oz)   BMI 31.00 kg/m  Wt Readings from Last 3 Encounters:  03/08/23 76.9 kg (169 lb 8 oz)  05/20/22 89.4 kg (197 lb 3.2 oz)  02/04/22 88 kg (194 lb)    BMI Readings from Last 3 Encounters:  03/08/23 31.00 kg/m  05/20/22 36.07 kg/m  02/04/22 35.48 kg/m     GEN: appears well, in NAD HEENT: sclerae anicteric NECK:  no visible neck mass or deformity CHEST: normal breathing chest movements NEURO: Aox3, following commands PSYCH: normal affect. SKIN: no visible rash   Lab Review: most recent Calcium, Cr, GFR, Alk Phos, Phosphate, PTH and vitamin levels reviewed   Outside labs reviewed and discussed with patient  See HPI and A&P  12/28/21 Vit d 19 low Calcium 9.8  12/16/21 GFR 12 Phos 4.1 Calcium 9.3  10/2021 PTH 418  05/2021 PTH  1113  Component     Latest Ref Rng 07/20/2021  Creatinine     0.60 - 0.80 mg/dL 6.29 (H)   eGFR CKD-EPI (2021) Female     >=60 mL/min/1.84m2 13 (L)     2021 08/21/19 Alk phos 50  Radiology: most recent DXA scan reviewed and compared to previous one   Outside DXA 08/26/21 LUMBAR SPINE (L2-L4):  BMD (in g/cm2): 0.840  T-score: -2.2  Z-score: -1.1  RIGHT FEMORAL NECK:  BMD (in g/cm2): 0.636  T-score: -1.9  Z-score: -0.9  RIGHT TOTAL HIP:  BMD (in g/cm2): 0.720  T-score: -1.8  Z-score: -1.0  LEFT FOREARM (RADIUS 33%):  BMD (in g/cm2): 0.524  T-score: -2.8  Z-score: -1.2     [id number]      03/29/07  10:06:00     [id number] [facility]) : QDR BODY  NON-EXTREMITIES    EXAM: QDR BODY NON-EXTREMITIES    Dual energy x-ray absorptiometry was performed assessing the bone  mineral density in the lumbar spine and proximal left femur using  a [employer].    CLINICAL INDICATIONS:  68 year old F with a history of  glucocorticoid use.    The bone mineral density in the spine measuring L1 to 4 measures  0.758 gm/cm2.  The  Z score is -2.7 and the T score is -3.6.  This value is below the fracture risk threshold.    The total bone mineral density in the proximal left femur  measures 0.671 gm/cm2.  The Z score is -2.0 and the T score is  -2.3.  This value is below the fracture risk threshold.  The  other T scores range from -1.9 to -2.6.    IMPRESSION: Femoral density indicates osteopenia.  The spine  density indicates osteoporosis.

## 2023-03-09 ENCOUNTER — Other Ambulatory Visit: Payer: Medicare PPO

## 2023-03-13 DIAGNOSIS — N2581 Secondary hyperparathyroidism of renal origin: Secondary | ICD-10-CM | POA: Diagnosis not present

## 2023-03-13 DIAGNOSIS — N186 End stage renal disease: Secondary | ICD-10-CM | POA: Diagnosis not present

## 2023-03-13 DIAGNOSIS — Z992 Dependence on renal dialysis: Secondary | ICD-10-CM | POA: Diagnosis not present

## 2023-03-14 DIAGNOSIS — N186 End stage renal disease: Secondary | ICD-10-CM | POA: Diagnosis not present

## 2023-03-14 DIAGNOSIS — N2581 Secondary hyperparathyroidism of renal origin: Secondary | ICD-10-CM | POA: Diagnosis not present

## 2023-03-14 DIAGNOSIS — Z992 Dependence on renal dialysis: Secondary | ICD-10-CM | POA: Diagnosis not present

## 2023-03-16 ENCOUNTER — Encounter (HOSPITAL_COMMUNITY): Payer: Self-pay

## 2023-03-16 ENCOUNTER — Ambulatory Visit
Admission: RE | Admit: 2023-03-16 | Discharge: 2023-03-16 | Discharge status: Home / Self Care | Payer: Medicare PPO | Source: Ambulatory Visit | Attending: Surgery | Admitting: Surgery

## 2023-03-16 DIAGNOSIS — E041 Nontoxic single thyroid nodule: Secondary | ICD-10-CM | POA: Diagnosis not present

## 2023-03-16 DIAGNOSIS — N2581 Secondary hyperparathyroidism of renal origin: Secondary | ICD-10-CM

## 2023-03-16 DIAGNOSIS — Z992 Dependence on renal dialysis: Secondary | ICD-10-CM | POA: Diagnosis not present

## 2023-03-16 DIAGNOSIS — N186 End stage renal disease: Secondary | ICD-10-CM | POA: Diagnosis not present

## 2023-03-17 DIAGNOSIS — N186 End stage renal disease: Secondary | ICD-10-CM | POA: Diagnosis not present

## 2023-03-17 DIAGNOSIS — N2581 Secondary hyperparathyroidism of renal origin: Secondary | ICD-10-CM | POA: Diagnosis not present

## 2023-03-17 DIAGNOSIS — Z992 Dependence on renal dialysis: Secondary | ICD-10-CM | POA: Diagnosis not present

## 2023-03-20 DIAGNOSIS — N186 End stage renal disease: Secondary | ICD-10-CM | POA: Diagnosis not present

## 2023-03-20 DIAGNOSIS — N2581 Secondary hyperparathyroidism of renal origin: Secondary | ICD-10-CM | POA: Diagnosis not present

## 2023-03-20 DIAGNOSIS — Z992 Dependence on renal dialysis: Secondary | ICD-10-CM | POA: Diagnosis not present

## 2023-03-21 ENCOUNTER — Inpatient Hospital Stay (HOSPITAL_COMMUNITY): Admission: RE | Admit: 2023-03-21 | Payer: Medicare PPO | Source: Ambulatory Visit

## 2023-03-21 ENCOUNTER — Encounter (HOSPITAL_COMMUNITY): Payer: Self-pay

## 2023-03-21 DIAGNOSIS — Z992 Dependence on renal dialysis: Secondary | ICD-10-CM | POA: Diagnosis not present

## 2023-03-21 DIAGNOSIS — N2581 Secondary hyperparathyroidism of renal origin: Secondary | ICD-10-CM | POA: Diagnosis not present

## 2023-03-21 DIAGNOSIS — N186 End stage renal disease: Secondary | ICD-10-CM | POA: Diagnosis not present

## 2023-03-23 DIAGNOSIS — Z992 Dependence on renal dialysis: Secondary | ICD-10-CM | POA: Diagnosis not present

## 2023-03-23 DIAGNOSIS — N2581 Secondary hyperparathyroidism of renal origin: Secondary | ICD-10-CM | POA: Diagnosis not present

## 2023-03-23 DIAGNOSIS — N186 End stage renal disease: Secondary | ICD-10-CM | POA: Diagnosis not present

## 2023-03-24 DIAGNOSIS — N2581 Secondary hyperparathyroidism of renal origin: Secondary | ICD-10-CM | POA: Diagnosis not present

## 2023-03-24 DIAGNOSIS — N186 End stage renal disease: Secondary | ICD-10-CM | POA: Diagnosis not present

## 2023-03-24 DIAGNOSIS — Z992 Dependence on renal dialysis: Secondary | ICD-10-CM | POA: Diagnosis not present

## 2023-03-28 DIAGNOSIS — Z992 Dependence on renal dialysis: Secondary | ICD-10-CM | POA: Diagnosis not present

## 2023-03-28 DIAGNOSIS — N186 End stage renal disease: Secondary | ICD-10-CM | POA: Diagnosis not present

## 2023-03-29 ENCOUNTER — Encounter: Payer: Self-pay | Admitting: Surgery

## 2023-03-29 NOTE — Progress Notes (Signed)
 Telephone call to patient - LMOM.  Ultrasound really does not help with clarifying single gland versus multigland disease.  I favor this being secondary hyperparathyroidism.  At this time there is no urgency to have surgery.  Patient is having a dialysis catheter placed in anticipation of starting hemodialysis.  I have asked her to discuss with Dr. Marisue Humble.  She will likely require neck exploration with parathyroidectomy, possibly total parathyroidectomy, with autotransplant to the forearm.  This would be done at [facility] OR followed by admission with renal consultation.  I will discuss further with the patient.  Darnell Level, MD [facility] practice Office: [phone]

## 2023-03-30 DIAGNOSIS — N186 End stage renal disease: Secondary | ICD-10-CM | POA: Diagnosis not present

## 2023-03-30 DIAGNOSIS — Z992 Dependence on renal dialysis: Secondary | ICD-10-CM | POA: Diagnosis not present

## 2023-03-31 DIAGNOSIS — N186 End stage renal disease: Secondary | ICD-10-CM | POA: Diagnosis not present

## 2023-03-31 DIAGNOSIS — Z992 Dependence on renal dialysis: Secondary | ICD-10-CM | POA: Diagnosis not present

## 2023-03-31 DIAGNOSIS — I120 Hypertensive chronic kidney disease with stage 5 chronic kidney disease or end stage renal disease: Secondary | ICD-10-CM | POA: Diagnosis not present

## 2023-04-01 DIAGNOSIS — Z992 Dependence on renal dialysis: Secondary | ICD-10-CM | POA: Diagnosis not present

## 2023-04-01 DIAGNOSIS — N186 End stage renal disease: Secondary | ICD-10-CM | POA: Diagnosis not present

## 2023-04-03 DIAGNOSIS — Z992 Dependence on renal dialysis: Secondary | ICD-10-CM | POA: Diagnosis not present

## 2023-04-03 DIAGNOSIS — N186 End stage renal disease: Secondary | ICD-10-CM | POA: Diagnosis not present

## 2023-04-04 DIAGNOSIS — Z992 Dependence on renal dialysis: Secondary | ICD-10-CM | POA: Diagnosis not present

## 2023-04-04 DIAGNOSIS — N186 End stage renal disease: Secondary | ICD-10-CM | POA: Diagnosis not present

## 2023-04-05 NOTE — Progress Notes (Signed)
 Telephone call to patient - reached patient this morning.  Patient has started dialysis.  Discussed operative strategy - would plan neck exploration at [facility] with admission.  Possibly one gland versus four gland excision with autotransplantation.  At this time, patient would like to get started better with dialysis and possibly home hemodialysis.  Will postpone surgery until instructed by patient and her nephrologist to proceed - Dr. Marisue Humble.  Darnell Level, MD [facility] practice Office: [phone]

## 2023-04-06 DIAGNOSIS — N186 End stage renal disease: Secondary | ICD-10-CM | POA: Diagnosis not present

## 2023-04-06 DIAGNOSIS — Z992 Dependence on renal dialysis: Secondary | ICD-10-CM | POA: Diagnosis not present

## 2023-04-08 DIAGNOSIS — Z992 Dependence on renal dialysis: Secondary | ICD-10-CM | POA: Diagnosis not present

## 2023-04-08 DIAGNOSIS — N186 End stage renal disease: Secondary | ICD-10-CM | POA: Diagnosis not present

## 2023-04-11 DIAGNOSIS — N186 End stage renal disease: Secondary | ICD-10-CM | POA: Diagnosis not present

## 2023-04-11 DIAGNOSIS — Z992 Dependence on renal dialysis: Secondary | ICD-10-CM | POA: Diagnosis not present

## 2023-04-12 DIAGNOSIS — M25552 Pain in left hip: Secondary | ICD-10-CM | POA: Diagnosis not present

## 2023-04-12 DIAGNOSIS — S82101K Unspecified fracture of upper end of right tibia, subsequent encounter for closed fracture with nonunion: Secondary | ICD-10-CM | POA: Diagnosis not present

## 2023-04-12 DIAGNOSIS — M84361K Stress fracture, right tibia, subsequent encounter for fracture with nonunion: Secondary | ICD-10-CM | POA: Diagnosis not present

## 2023-04-13 DIAGNOSIS — Z992 Dependence on renal dialysis: Secondary | ICD-10-CM | POA: Diagnosis not present

## 2023-04-13 DIAGNOSIS — N186 End stage renal disease: Secondary | ICD-10-CM | POA: Diagnosis not present

## 2023-04-15 DIAGNOSIS — N186 End stage renal disease: Secondary | ICD-10-CM | POA: Diagnosis not present

## 2023-04-15 DIAGNOSIS — Z992 Dependence on renal dialysis: Secondary | ICD-10-CM | POA: Diagnosis not present

## 2023-04-18 ENCOUNTER — Encounter (HOSPITAL_COMMUNITY): Payer: Medicare PPO

## 2023-04-18 DIAGNOSIS — Z992 Dependence on renal dialysis: Secondary | ICD-10-CM | POA: Diagnosis not present

## 2023-04-18 DIAGNOSIS — N186 End stage renal disease: Secondary | ICD-10-CM | POA: Diagnosis not present

## 2023-04-19 DIAGNOSIS — M25569 Pain in unspecified knee: Secondary | ICD-10-CM | POA: Diagnosis not present

## 2023-04-19 DIAGNOSIS — M109 Gout, unspecified: Secondary | ICD-10-CM | POA: Diagnosis not present

## 2023-04-19 DIAGNOSIS — M25559 Pain in unspecified hip: Secondary | ICD-10-CM | POA: Diagnosis not present

## 2023-04-19 DIAGNOSIS — R2681 Unsteadiness on feet: Secondary | ICD-10-CM | POA: Diagnosis not present

## 2023-04-20 DIAGNOSIS — N186 End stage renal disease: Secondary | ICD-10-CM | POA: Diagnosis not present

## 2023-04-20 DIAGNOSIS — Z992 Dependence on renal dialysis: Secondary | ICD-10-CM | POA: Diagnosis not present

## 2023-04-21 DIAGNOSIS — M25559 Pain in unspecified hip: Secondary | ICD-10-CM | POA: Diagnosis not present

## 2023-04-21 DIAGNOSIS — M109 Gout, unspecified: Secondary | ICD-10-CM | POA: Diagnosis not present

## 2023-04-21 DIAGNOSIS — M25569 Pain in unspecified knee: Secondary | ICD-10-CM | POA: Diagnosis not present

## 2023-04-21 DIAGNOSIS — R2681 Unsteadiness on feet: Secondary | ICD-10-CM | POA: Diagnosis not present

## 2023-04-22 DIAGNOSIS — N186 End stage renal disease: Secondary | ICD-10-CM | POA: Diagnosis not present

## 2023-04-22 DIAGNOSIS — Z992 Dependence on renal dialysis: Secondary | ICD-10-CM | POA: Diagnosis not present

## 2023-04-24 DIAGNOSIS — R2681 Unsteadiness on feet: Secondary | ICD-10-CM | POA: Diagnosis not present

## 2023-04-24 DIAGNOSIS — M25559 Pain in unspecified hip: Secondary | ICD-10-CM | POA: Diagnosis not present

## 2023-04-24 DIAGNOSIS — M25569 Pain in unspecified knee: Secondary | ICD-10-CM | POA: Diagnosis not present

## 2023-04-24 DIAGNOSIS — M109 Gout, unspecified: Secondary | ICD-10-CM | POA: Diagnosis not present

## 2023-04-25 DIAGNOSIS — Z992 Dependence on renal dialysis: Secondary | ICD-10-CM | POA: Diagnosis not present

## 2023-04-25 DIAGNOSIS — N186 End stage renal disease: Secondary | ICD-10-CM | POA: Diagnosis not present

## 2023-04-26 ENCOUNTER — Other Ambulatory Visit: Payer: Self-pay | Admitting: Nephrology

## 2023-04-26 DIAGNOSIS — M109 Gout, unspecified: Secondary | ICD-10-CM | POA: Diagnosis not present

## 2023-04-26 DIAGNOSIS — M25559 Pain in unspecified hip: Secondary | ICD-10-CM | POA: Diagnosis not present

## 2023-04-26 DIAGNOSIS — M25569 Pain in unspecified knee: Secondary | ICD-10-CM | POA: Diagnosis not present

## 2023-04-26 DIAGNOSIS — R109 Unspecified abdominal pain: Secondary | ICD-10-CM

## 2023-04-26 DIAGNOSIS — R2681 Unsteadiness on feet: Secondary | ICD-10-CM | POA: Diagnosis not present

## 2023-04-27 ENCOUNTER — Encounter: Payer: Self-pay | Admitting: Nephrology

## 2023-04-27 DIAGNOSIS — N186 End stage renal disease: Secondary | ICD-10-CM | POA: Diagnosis not present

## 2023-04-27 DIAGNOSIS — Z992 Dependence on renal dialysis: Secondary | ICD-10-CM | POA: Diagnosis not present

## 2023-04-28 ENCOUNTER — Other Ambulatory Visit

## 2023-04-28 DIAGNOSIS — R2681 Unsteadiness on feet: Secondary | ICD-10-CM | POA: Diagnosis not present

## 2023-04-28 DIAGNOSIS — M25569 Pain in unspecified knee: Secondary | ICD-10-CM | POA: Diagnosis not present

## 2023-04-28 DIAGNOSIS — M25559 Pain in unspecified hip: Secondary | ICD-10-CM | POA: Diagnosis not present

## 2023-04-28 DIAGNOSIS — M109 Gout, unspecified: Secondary | ICD-10-CM | POA: Diagnosis not present

## 2023-04-29 DIAGNOSIS — Z992 Dependence on renal dialysis: Secondary | ICD-10-CM | POA: Diagnosis not present

## 2023-04-29 DIAGNOSIS — N186 End stage renal disease: Secondary | ICD-10-CM | POA: Diagnosis not present

## 2023-05-01 ENCOUNTER — Ambulatory Visit
Admission: RE | Admit: 2023-05-01 | Discharge: 2023-05-01 | Disposition: A | Discharge status: Home / Self Care | Source: Ambulatory Visit | Attending: Nephrology | Admitting: Nephrology

## 2023-05-01 DIAGNOSIS — N189 Chronic kidney disease, unspecified: Secondary | ICD-10-CM | POA: Diagnosis not present

## 2023-05-01 DIAGNOSIS — M25569 Pain in unspecified knee: Secondary | ICD-10-CM | POA: Diagnosis not present

## 2023-05-01 DIAGNOSIS — I120 Hypertensive chronic kidney disease with stage 5 chronic kidney disease or end stage renal disease: Secondary | ICD-10-CM | POA: Diagnosis not present

## 2023-05-01 DIAGNOSIS — M109 Gout, unspecified: Secondary | ICD-10-CM | POA: Diagnosis not present

## 2023-05-01 DIAGNOSIS — R2681 Unsteadiness on feet: Secondary | ICD-10-CM | POA: Diagnosis not present

## 2023-05-01 DIAGNOSIS — N281 Cyst of kidney, acquired: Secondary | ICD-10-CM | POA: Diagnosis not present

## 2023-05-01 DIAGNOSIS — N186 End stage renal disease: Secondary | ICD-10-CM | POA: Diagnosis not present

## 2023-05-01 DIAGNOSIS — R109 Unspecified abdominal pain: Secondary | ICD-10-CM

## 2023-05-01 DIAGNOSIS — Z992 Dependence on renal dialysis: Secondary | ICD-10-CM | POA: Diagnosis not present

## 2023-05-01 DIAGNOSIS — M25559 Pain in unspecified hip: Secondary | ICD-10-CM | POA: Diagnosis not present

## 2023-05-02 DIAGNOSIS — N2581 Secondary hyperparathyroidism of renal origin: Secondary | ICD-10-CM | POA: Diagnosis not present

## 2023-05-02 DIAGNOSIS — Z992 Dependence on renal dialysis: Secondary | ICD-10-CM | POA: Diagnosis not present

## 2023-05-02 DIAGNOSIS — N186 End stage renal disease: Secondary | ICD-10-CM | POA: Diagnosis not present

## 2023-05-04 DIAGNOSIS — Z992 Dependence on renal dialysis: Secondary | ICD-10-CM | POA: Diagnosis not present

## 2023-05-04 DIAGNOSIS — N186 End stage renal disease: Secondary | ICD-10-CM | POA: Diagnosis not present

## 2023-05-04 DIAGNOSIS — N2581 Secondary hyperparathyroidism of renal origin: Secondary | ICD-10-CM | POA: Diagnosis not present

## 2023-05-05 DIAGNOSIS — M25569 Pain in unspecified knee: Secondary | ICD-10-CM | POA: Diagnosis not present

## 2023-05-05 DIAGNOSIS — M109 Gout, unspecified: Secondary | ICD-10-CM | POA: Diagnosis not present

## 2023-05-05 DIAGNOSIS — R2681 Unsteadiness on feet: Secondary | ICD-10-CM | POA: Diagnosis not present

## 2023-05-05 DIAGNOSIS — M25559 Pain in unspecified hip: Secondary | ICD-10-CM | POA: Diagnosis not present

## 2023-05-06 DIAGNOSIS — N2581 Secondary hyperparathyroidism of renal origin: Secondary | ICD-10-CM | POA: Diagnosis not present

## 2023-05-06 DIAGNOSIS — N186 End stage renal disease: Secondary | ICD-10-CM | POA: Diagnosis not present

## 2023-05-06 DIAGNOSIS — Z992 Dependence on renal dialysis: Secondary | ICD-10-CM | POA: Diagnosis not present

## 2023-05-08 DIAGNOSIS — M25569 Pain in unspecified knee: Secondary | ICD-10-CM | POA: Diagnosis not present

## 2023-05-08 DIAGNOSIS — M25559 Pain in unspecified hip: Secondary | ICD-10-CM | POA: Diagnosis not present

## 2023-05-08 DIAGNOSIS — R2681 Unsteadiness on feet: Secondary | ICD-10-CM | POA: Diagnosis not present

## 2023-05-08 DIAGNOSIS — M109 Gout, unspecified: Secondary | ICD-10-CM | POA: Diagnosis not present

## 2023-05-09 DIAGNOSIS — N2581 Secondary hyperparathyroidism of renal origin: Secondary | ICD-10-CM | POA: Diagnosis not present

## 2023-05-09 DIAGNOSIS — N186 End stage renal disease: Secondary | ICD-10-CM | POA: Diagnosis not present

## 2023-05-09 DIAGNOSIS — Z992 Dependence on renal dialysis: Secondary | ICD-10-CM | POA: Diagnosis not present

## 2023-05-11 DIAGNOSIS — Z992 Dependence on renal dialysis: Secondary | ICD-10-CM | POA: Diagnosis not present

## 2023-05-11 DIAGNOSIS — N2581 Secondary hyperparathyroidism of renal origin: Secondary | ICD-10-CM | POA: Diagnosis not present

## 2023-05-11 DIAGNOSIS — N186 End stage renal disease: Secondary | ICD-10-CM | POA: Diagnosis not present

## 2023-05-15 DIAGNOSIS — Z992 Dependence on renal dialysis: Secondary | ICD-10-CM | POA: Diagnosis not present

## 2023-05-15 DIAGNOSIS — N2581 Secondary hyperparathyroidism of renal origin: Secondary | ICD-10-CM | POA: Diagnosis not present

## 2023-05-15 DIAGNOSIS — N186 End stage renal disease: Secondary | ICD-10-CM | POA: Diagnosis not present

## 2023-05-16 DIAGNOSIS — N2581 Secondary hyperparathyroidism of renal origin: Secondary | ICD-10-CM | POA: Diagnosis not present

## 2023-05-16 DIAGNOSIS — N186 End stage renal disease: Secondary | ICD-10-CM | POA: Diagnosis not present

## 2023-05-16 DIAGNOSIS — Z992 Dependence on renal dialysis: Secondary | ICD-10-CM | POA: Diagnosis not present

## 2023-05-17 DIAGNOSIS — M109 Gout, unspecified: Secondary | ICD-10-CM | POA: Diagnosis not present

## 2023-05-17 DIAGNOSIS — M25569 Pain in unspecified knee: Secondary | ICD-10-CM | POA: Diagnosis not present

## 2023-05-17 DIAGNOSIS — R2681 Unsteadiness on feet: Secondary | ICD-10-CM | POA: Diagnosis not present

## 2023-05-17 DIAGNOSIS — M25559 Pain in unspecified hip: Secondary | ICD-10-CM | POA: Diagnosis not present

## 2023-05-18 DIAGNOSIS — Z992 Dependence on renal dialysis: Secondary | ICD-10-CM | POA: Diagnosis not present

## 2023-05-18 DIAGNOSIS — N2581 Secondary hyperparathyroidism of renal origin: Secondary | ICD-10-CM | POA: Diagnosis not present

## 2023-05-18 DIAGNOSIS — N186 End stage renal disease: Secondary | ICD-10-CM | POA: Diagnosis not present

## 2023-05-19 DIAGNOSIS — R2681 Unsteadiness on feet: Secondary | ICD-10-CM | POA: Diagnosis not present

## 2023-05-19 DIAGNOSIS — M109 Gout, unspecified: Secondary | ICD-10-CM | POA: Diagnosis not present

## 2023-05-19 DIAGNOSIS — M25569 Pain in unspecified knee: Secondary | ICD-10-CM | POA: Diagnosis not present

## 2023-05-19 DIAGNOSIS — M25559 Pain in unspecified hip: Secondary | ICD-10-CM | POA: Diagnosis not present

## 2023-05-20 DIAGNOSIS — N186 End stage renal disease: Secondary | ICD-10-CM | POA: Diagnosis not present

## 2023-05-20 DIAGNOSIS — N2581 Secondary hyperparathyroidism of renal origin: Secondary | ICD-10-CM | POA: Diagnosis not present

## 2023-05-20 DIAGNOSIS — Z992 Dependence on renal dialysis: Secondary | ICD-10-CM | POA: Diagnosis not present

## 2023-05-22 DIAGNOSIS — K219 Gastro-esophageal reflux disease without esophagitis: Secondary | ICD-10-CM | POA: Diagnosis not present

## 2023-05-22 DIAGNOSIS — M25569 Pain in unspecified knee: Secondary | ICD-10-CM | POA: Diagnosis not present

## 2023-05-22 DIAGNOSIS — M25559 Pain in unspecified hip: Secondary | ICD-10-CM | POA: Diagnosis not present

## 2023-05-22 DIAGNOSIS — Z1331 Encounter for screening for depression: Secondary | ICD-10-CM | POA: Diagnosis not present

## 2023-05-22 DIAGNOSIS — G47411 Narcolepsy with cataplexy: Secondary | ICD-10-CM | POA: Diagnosis not present

## 2023-05-22 DIAGNOSIS — M810 Age-related osteoporosis without current pathological fracture: Secondary | ICD-10-CM | POA: Diagnosis not present

## 2023-05-22 DIAGNOSIS — Z0000 Encounter for general adult medical examination without abnormal findings: Secondary | ICD-10-CM | POA: Diagnosis not present

## 2023-05-22 DIAGNOSIS — I10 Essential (primary) hypertension: Secondary | ICD-10-CM | POA: Diagnosis not present

## 2023-05-22 DIAGNOSIS — E7800 Pure hypercholesterolemia, unspecified: Secondary | ICD-10-CM | POA: Diagnosis not present

## 2023-05-22 DIAGNOSIS — J4520 Mild intermittent asthma, uncomplicated: Secondary | ICD-10-CM | POA: Diagnosis not present

## 2023-05-22 DIAGNOSIS — M3210 Systemic lupus erythematosus, organ or system involvement unspecified: Secondary | ICD-10-CM | POA: Diagnosis not present

## 2023-05-22 DIAGNOSIS — R2681 Unsteadiness on feet: Secondary | ICD-10-CM | POA: Diagnosis not present

## 2023-05-22 DIAGNOSIS — M109 Gout, unspecified: Secondary | ICD-10-CM | POA: Diagnosis not present

## 2023-05-22 DIAGNOSIS — E213 Hyperparathyroidism, unspecified: Secondary | ICD-10-CM | POA: Diagnosis not present

## 2023-05-23 DIAGNOSIS — N186 End stage renal disease: Secondary | ICD-10-CM | POA: Diagnosis not present

## 2023-05-23 DIAGNOSIS — Z992 Dependence on renal dialysis: Secondary | ICD-10-CM | POA: Diagnosis not present

## 2023-05-23 DIAGNOSIS — N2581 Secondary hyperparathyroidism of renal origin: Secondary | ICD-10-CM | POA: Diagnosis not present

## 2023-05-24 DIAGNOSIS — M25559 Pain in unspecified hip: Secondary | ICD-10-CM | POA: Diagnosis not present

## 2023-05-24 DIAGNOSIS — M25569 Pain in unspecified knee: Secondary | ICD-10-CM | POA: Diagnosis not present

## 2023-05-24 DIAGNOSIS — R2681 Unsteadiness on feet: Secondary | ICD-10-CM | POA: Diagnosis not present

## 2023-05-24 DIAGNOSIS — M109 Gout, unspecified: Secondary | ICD-10-CM | POA: Diagnosis not present

## 2023-05-25 DIAGNOSIS — Z992 Dependence on renal dialysis: Secondary | ICD-10-CM | POA: Diagnosis not present

## 2023-05-25 DIAGNOSIS — N2581 Secondary hyperparathyroidism of renal origin: Secondary | ICD-10-CM | POA: Diagnosis not present

## 2023-05-25 DIAGNOSIS — N186 End stage renal disease: Secondary | ICD-10-CM | POA: Diagnosis not present

## 2023-05-29 DIAGNOSIS — Z79899 Other long term (current) drug therapy: Secondary | ICD-10-CM | POA: Diagnosis not present

## 2023-05-29 DIAGNOSIS — M329 Systemic lupus erythematosus, unspecified: Secondary | ICD-10-CM | POA: Diagnosis not present

## 2023-05-29 DIAGNOSIS — Z7952 Long term (current) use of systemic steroids: Secondary | ICD-10-CM | POA: Diagnosis not present

## 2023-05-29 DIAGNOSIS — N186 End stage renal disease: Secondary | ICD-10-CM | POA: Diagnosis not present

## 2023-05-29 DIAGNOSIS — N189 Chronic kidney disease, unspecified: Secondary | ICD-10-CM | POA: Diagnosis not present

## 2023-05-29 DIAGNOSIS — M1A9XX1 Chronic gout, unspecified, with tophus (tophi): Secondary | ICD-10-CM | POA: Diagnosis not present

## 2023-05-29 DIAGNOSIS — M1A09X1 Idiopathic chronic gout, multiple sites, with tophus (tophi): Secondary | ICD-10-CM | POA: Diagnosis not present

## 2023-05-29 DIAGNOSIS — G4733 Obstructive sleep apnea (adult) (pediatric): Secondary | ICD-10-CM | POA: Diagnosis not present

## 2023-05-29 DIAGNOSIS — I272 Pulmonary hypertension, unspecified: Secondary | ICD-10-CM | POA: Diagnosis not present

## 2023-05-29 DIAGNOSIS — M810 Age-related osteoporosis without current pathological fracture: Secondary | ICD-10-CM | POA: Diagnosis not present

## 2023-05-29 DIAGNOSIS — Z992 Dependence on renal dialysis: Secondary | ICD-10-CM | POA: Diagnosis not present

## 2023-05-29 DIAGNOSIS — S82101S Unspecified fracture of upper end of right tibia, sequela: Secondary | ICD-10-CM | POA: Diagnosis not present

## 2023-05-29 DIAGNOSIS — N2581 Secondary hyperparathyroidism of renal origin: Secondary | ICD-10-CM | POA: Diagnosis not present

## 2023-05-30 DIAGNOSIS — N2581 Secondary hyperparathyroidism of renal origin: Secondary | ICD-10-CM | POA: Diagnosis not present

## 2023-05-30 DIAGNOSIS — Z992 Dependence on renal dialysis: Secondary | ICD-10-CM | POA: Diagnosis not present

## 2023-05-30 DIAGNOSIS — N186 End stage renal disease: Secondary | ICD-10-CM | POA: Diagnosis not present

## 2023-05-31 DIAGNOSIS — I120 Hypertensive chronic kidney disease with stage 5 chronic kidney disease or end stage renal disease: Secondary | ICD-10-CM | POA: Diagnosis not present

## 2023-05-31 DIAGNOSIS — Z992 Dependence on renal dialysis: Secondary | ICD-10-CM | POA: Diagnosis not present

## 2023-05-31 DIAGNOSIS — N186 End stage renal disease: Secondary | ICD-10-CM | POA: Diagnosis not present

## 2023-06-01 DIAGNOSIS — N186 End stage renal disease: Secondary | ICD-10-CM | POA: Diagnosis not present

## 2023-06-01 DIAGNOSIS — Z992 Dependence on renal dialysis: Secondary | ICD-10-CM | POA: Diagnosis not present

## 2023-06-01 DIAGNOSIS — N2581 Secondary hyperparathyroidism of renal origin: Secondary | ICD-10-CM | POA: Diagnosis not present

## 2023-06-03 DIAGNOSIS — N2581 Secondary hyperparathyroidism of renal origin: Secondary | ICD-10-CM | POA: Diagnosis not present

## 2023-06-03 DIAGNOSIS — Z992 Dependence on renal dialysis: Secondary | ICD-10-CM | POA: Diagnosis not present

## 2023-06-03 DIAGNOSIS — N186 End stage renal disease: Secondary | ICD-10-CM | POA: Diagnosis not present

## 2023-06-05 DIAGNOSIS — M25569 Pain in unspecified knee: Secondary | ICD-10-CM | POA: Diagnosis not present

## 2023-06-05 DIAGNOSIS — M25559 Pain in unspecified hip: Secondary | ICD-10-CM | POA: Diagnosis not present

## 2023-06-05 DIAGNOSIS — R2681 Unsteadiness on feet: Secondary | ICD-10-CM | POA: Diagnosis not present

## 2023-06-05 DIAGNOSIS — M109 Gout, unspecified: Secondary | ICD-10-CM | POA: Diagnosis not present

## 2023-06-06 ENCOUNTER — Other Ambulatory Visit (INDEPENDENT_AMBULATORY_CARE_PROVIDER_SITE_OTHER): Payer: Self-pay

## 2023-06-06 ENCOUNTER — Encounter (HOSPITAL_COMMUNITY): Payer: Self-pay

## 2023-06-06 ENCOUNTER — Ambulatory Visit (INDEPENDENT_AMBULATORY_CARE_PROVIDER_SITE_OTHER): Admitting: Orthopaedic Surgery

## 2023-06-06 DIAGNOSIS — N186 End stage renal disease: Secondary | ICD-10-CM | POA: Diagnosis not present

## 2023-06-06 DIAGNOSIS — Z96642 Presence of left artificial hip joint: Secondary | ICD-10-CM

## 2023-06-06 DIAGNOSIS — N2581 Secondary hyperparathyroidism of renal origin: Secondary | ICD-10-CM | POA: Diagnosis not present

## 2023-06-06 DIAGNOSIS — G8929 Other chronic pain: Secondary | ICD-10-CM

## 2023-06-06 DIAGNOSIS — M2170 Unequal limb length (acquired), unspecified site: Secondary | ICD-10-CM | POA: Diagnosis not present

## 2023-06-06 DIAGNOSIS — M25552 Pain in left hip: Secondary | ICD-10-CM | POA: Diagnosis not present

## 2023-06-06 DIAGNOSIS — S82224K Nondisplaced transverse fracture of shaft of right tibia, subsequent encounter for closed fracture with nonunion: Secondary | ICD-10-CM

## 2023-06-06 DIAGNOSIS — Z992 Dependence on renal dialysis: Secondary | ICD-10-CM | POA: Diagnosis not present

## 2023-06-06 NOTE — Progress Notes (Signed)
 Office Visit Note   Patient: Colleen Obrien           Date of Birth: 12/05/1955           MRN: [id number] Visit Date: 06/06/2023              Requested by: Faustina Hood, MD [phone] [place],  [place] [address] PCP: Faustina Hood, MD   Assessment & Plan: Visit Diagnoses:  1. Status post total replacement of left hip   2. Chronic left hip pain   3. Leg length discrepancy   4. 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  Left hip pain has been present for one to two weeks, localized to the side of the hip with tenderness over the trochanteric bursa. The pain is noticeable but not severe.  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 The right leg is shorter than the left, causing a leg length discrepancy and pelvic tilt, which may contribute to the hip pain.  She started dialysis five weeks ago and feels stronger, though the impact on bone quality is unclear.  Physical Exam MUSCULOSKELETAL: Tenderness over the left trochanteric bursa. Right leg significantly shorter than left leg.  Assessment and Plan Right proximal tibia nonunion - Currently managed by Dr. Guyann Leitz - This has created a significant leg length discrepancy.  Leg length discrepancy Significant discrepancy with right leg shorter, causing pelvic tilt and musculoskeletal issues.  - Prescribe heel lift or shoe lift. - Refer to orthotic provider for assessment and fabrication of orthotic.  Trochanteric bursitis versus abductor tendinosis Tenderness over left trochanteric bursa, likely secondary to leg length discrepancy and pelvic tilt.  Total face to face encounter time was greater than 25 minutes and over half of this time was spent in counseling  and/or coordination of care.  Follow-Up Instructions: No follow-ups on file.   Orders:  Orders Placed This Encounter  Procedures   XR HIP UNILAT W OR W/O PELVIS 2-3 VIEWS LEFT   No orders of the defined types were placed in this encounter.     Procedures: No procedures performed   Clinical Data: No additional findings.   Subjective: Chief Complaint  Patient presents with   Left Hip - Follow-up    HPI  Review of Systems  Constitutional: Negative.   HENT: Negative.    Eyes: Negative.   Respiratory: Negative.    Cardiovascular: Negative.   Endocrine: Negative.   Musculoskeletal: Negative.   Neurological: Negative.   Hematological: Negative.   Psychiatric/Behavioral: Negative.    All other systems reviewed and are negative.    Objective: Vital Signs: There were no vitals taken for this visit.  Physical Exam Vitals and nursing note reviewed.  Constitutional:      Appearance: She is well-developed.  HENT:     Head: Atraumatic.     Nose: Nose normal.  Eyes:     Extraocular Movements: Extraocular movements intact.  Cardiovascular:     Pulses: Normal pulses.  Pulmonary:     Effort: Pulmonary effort is normal.  Abdominal:     Palpations: Abdomen is soft.  Musculoskeletal:     Cervical back: Neck supple.  Skin:    General: Skin is warm.     Capillary Refill: Capillary  refill takes less than 2 seconds.  Neurological:     Mental Status: She is alert. Mental status is at baseline.  Psychiatric:        Behavior: Behavior normal.        Thought Content: Thought content normal.        Judgment: Judgment normal.     Ortho Exam  Specialty Comments:  No specialty comments available.  Imaging: XR HIP UNILAT W OR W/O PELVIS 2-3 VIEWS LEFT Result Date: 06/06/2023 X-rays of the left hip show status post left total hip arthroplasty without complications.  Pelvic tilt is noted with the left hip higher than the right.    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 05/03/2022   Anemia of chronic renal failure 03/08/2021   Chronic renal disease, stage IV (HCC) 03/08/2021   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   AVN (avascular necrosis of bone) (HCC) 07/08/2019   Diverticulosis of colon without hemorrhage 07/08/2019   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   Tremor 03/02/2017   Shaking 08/23/2016   Excessive daytime sleepiness 08/23/2016   Obstructive sleep apnea 08/23/2016   Past Medical History:  Diagnosis Date   Anemia in chronic kidney disease (CKD)    Arthritis    Asthma    Blood dyscrasia    systemic lupus   Chronic renal disease, stage 5, glomerular filtration rate less than or equal to 15 mL/min/1.73 square meter (HCC)    Dyspnea    GERD (gastroesophageal reflux disease)    Gout    Hypertension    Lupus    Narcolepsy    Osteoporosis 05/05/2022   Pneumonia    x 1   Sleep apnea    tries to use CPAP nightly as of 04/29/22   Walker as Engineer, water dependent    when going outside home    Family History  Problem Relation Age of Onset   Hypertension Mother    Diabetes Mother    Dementia Father    Hypertension Father    Hyperlipidemia Father    Diabetes Paternal Grandmother    Alcohol abuse Paternal Grandfather     Past Surgical History:  Procedure Laterality Date   CESAREAN SECTION     x 2   COLONOSCOPY     DIALYSIS/PERMA CATHETER INSERTION N/A 03/07/2023   Procedure: DIALYSIS/PERMA CATHETER INSERTION;  Surgeon: Baron Border, MD;  [facility];  Service: Cardiovascular;   Laterality: N/A;   ganglion cyst removed Right    arm   TIBIA IM NAIL INSERTION Right 05/03/2022   Procedure: INTRAMEDULLARY (IM) NAIL TIBIAL;  Surgeon: Hardy Lia, MD;  Location: [facility] OR;  Service: Orthopedics;  Laterality: Right;   TOTAL HIP ARTHROPLASTY Left 08/26/2019   Procedure: LEFT TOTAL HIP ARTHROPLASTY ANTERIOR APPROACH;  Surgeon: Wes Hamman, MD;  Location: [facility] OR;  Service: Orthopedics;  Laterality: Left;   TUBAL LIGATION     Social History   Occupational History   Occupation: Cytotechnologists  Tobacco Use   Smoking status: Never  Smokeless tobacco: Never  Vaping Use   Vaping status: Never Used  Substance and Sexual Activity   Alcohol use: No   Drug use: No   Sexual activity: Not on file

## 2023-06-07 DIAGNOSIS — M109 Gout, unspecified: Secondary | ICD-10-CM | POA: Diagnosis not present

## 2023-06-07 DIAGNOSIS — M25569 Pain in unspecified knee: Secondary | ICD-10-CM | POA: Diagnosis not present

## 2023-06-07 DIAGNOSIS — R2681 Unsteadiness on feet: Secondary | ICD-10-CM | POA: Diagnosis not present

## 2023-06-07 DIAGNOSIS — M85851 Other specified disorders of bone density and structure, right thigh: Secondary | ICD-10-CM | POA: Diagnosis not present

## 2023-06-07 DIAGNOSIS — M25559 Pain in unspecified hip: Secondary | ICD-10-CM | POA: Diagnosis not present

## 2023-06-07 DIAGNOSIS — S82101K Unspecified fracture of upper end of right tibia, subsequent encounter for closed fracture with nonunion: Secondary | ICD-10-CM | POA: Diagnosis not present

## 2023-06-07 DIAGNOSIS — S82101S Unspecified fracture of upper end of right tibia, sequela: Secondary | ICD-10-CM | POA: Diagnosis not present

## 2023-06-08 DIAGNOSIS — N186 End stage renal disease: Secondary | ICD-10-CM | POA: Diagnosis not present

## 2023-06-08 DIAGNOSIS — Z992 Dependence on renal dialysis: Secondary | ICD-10-CM | POA: Diagnosis not present

## 2023-06-08 DIAGNOSIS — N2581 Secondary hyperparathyroidism of renal origin: Secondary | ICD-10-CM | POA: Diagnosis not present

## 2023-06-09 ENCOUNTER — Encounter (HOSPITAL_COMMUNITY): Payer: Self-pay

## 2023-06-10 DIAGNOSIS — Z992 Dependence on renal dialysis: Secondary | ICD-10-CM | POA: Diagnosis not present

## 2023-06-10 DIAGNOSIS — N2581 Secondary hyperparathyroidism of renal origin: Secondary | ICD-10-CM | POA: Diagnosis not present

## 2023-06-10 DIAGNOSIS — N186 End stage renal disease: Secondary | ICD-10-CM | POA: Diagnosis not present

## 2023-06-13 DIAGNOSIS — N186 End stage renal disease: Secondary | ICD-10-CM | POA: Diagnosis not present

## 2023-06-13 DIAGNOSIS — Z992 Dependence on renal dialysis: Secondary | ICD-10-CM | POA: Diagnosis not present

## 2023-06-13 DIAGNOSIS — N2581 Secondary hyperparathyroidism of renal origin: Secondary | ICD-10-CM | POA: Diagnosis not present

## 2023-06-14 DIAGNOSIS — M25559 Pain in unspecified hip: Secondary | ICD-10-CM | POA: Diagnosis not present

## 2023-06-14 DIAGNOSIS — M109 Gout, unspecified: Secondary | ICD-10-CM | POA: Diagnosis not present

## 2023-06-14 DIAGNOSIS — M25569 Pain in unspecified knee: Secondary | ICD-10-CM | POA: Diagnosis not present

## 2023-06-14 DIAGNOSIS — R2681 Unsteadiness on feet: Secondary | ICD-10-CM | POA: Diagnosis not present

## 2023-06-15 DIAGNOSIS — Z992 Dependence on renal dialysis: Secondary | ICD-10-CM | POA: Diagnosis not present

## 2023-06-15 DIAGNOSIS — N2581 Secondary hyperparathyroidism of renal origin: Secondary | ICD-10-CM | POA: Diagnosis not present

## 2023-06-15 DIAGNOSIS — N186 End stage renal disease: Secondary | ICD-10-CM | POA: Diagnosis not present

## 2023-06-16 DIAGNOSIS — Z7682 Awaiting organ transplant status: Secondary | ICD-10-CM | POA: Diagnosis not present

## 2023-06-16 DIAGNOSIS — Z01818 Encounter for other preprocedural examination: Secondary | ICD-10-CM | POA: Diagnosis not present

## 2023-06-16 DIAGNOSIS — N186 End stage renal disease: Secondary | ICD-10-CM | POA: Diagnosis not present

## 2023-06-16 DIAGNOSIS — Z1159 Encounter for screening for other viral diseases: Secondary | ICD-10-CM | POA: Diagnosis not present

## 2023-06-16 DIAGNOSIS — M3214 Glomerular disease in systemic lupus erythematosus: Secondary | ICD-10-CM | POA: Diagnosis not present

## 2023-06-16 DIAGNOSIS — Z992 Dependence on renal dialysis: Secondary | ICD-10-CM | POA: Diagnosis not present

## 2023-06-16 DIAGNOSIS — Z114 Encounter for screening for human immunodeficiency virus [HIV]: Secondary | ICD-10-CM | POA: Diagnosis not present

## 2023-06-17 DIAGNOSIS — N2581 Secondary hyperparathyroidism of renal origin: Secondary | ICD-10-CM | POA: Diagnosis not present

## 2023-06-17 DIAGNOSIS — Z992 Dependence on renal dialysis: Secondary | ICD-10-CM | POA: Diagnosis not present

## 2023-06-17 DIAGNOSIS — N186 End stage renal disease: Secondary | ICD-10-CM | POA: Diagnosis not present

## 2023-06-19 DIAGNOSIS — S82201K Unspecified fracture of shaft of right tibia, subsequent encounter for closed fracture with nonunion: Secondary | ICD-10-CM | POA: Diagnosis not present

## 2023-06-20 DIAGNOSIS — Z992 Dependence on renal dialysis: Secondary | ICD-10-CM | POA: Diagnosis not present

## 2023-06-20 DIAGNOSIS — N186 End stage renal disease: Secondary | ICD-10-CM | POA: Diagnosis not present

## 2023-06-20 DIAGNOSIS — N2581 Secondary hyperparathyroidism of renal origin: Secondary | ICD-10-CM | POA: Diagnosis not present

## 2023-06-21 DIAGNOSIS — M25559 Pain in unspecified hip: Secondary | ICD-10-CM | POA: Diagnosis not present

## 2023-06-21 DIAGNOSIS — M25569 Pain in unspecified knee: Secondary | ICD-10-CM | POA: Diagnosis not present

## 2023-06-21 DIAGNOSIS — M109 Gout, unspecified: Secondary | ICD-10-CM | POA: Diagnosis not present

## 2023-06-21 DIAGNOSIS — R2681 Unsteadiness on feet: Secondary | ICD-10-CM | POA: Diagnosis not present

## 2023-06-22 DIAGNOSIS — N2581 Secondary hyperparathyroidism of renal origin: Secondary | ICD-10-CM | POA: Diagnosis not present

## 2023-06-22 DIAGNOSIS — Z992 Dependence on renal dialysis: Secondary | ICD-10-CM | POA: Diagnosis not present

## 2023-06-22 DIAGNOSIS — N186 End stage renal disease: Secondary | ICD-10-CM | POA: Diagnosis not present

## 2023-06-23 DIAGNOSIS — R2681 Unsteadiness on feet: Secondary | ICD-10-CM | POA: Diagnosis not present

## 2023-06-23 DIAGNOSIS — M25569 Pain in unspecified knee: Secondary | ICD-10-CM | POA: Diagnosis not present

## 2023-06-23 DIAGNOSIS — M25559 Pain in unspecified hip: Secondary | ICD-10-CM | POA: Diagnosis not present

## 2023-06-23 DIAGNOSIS — M109 Gout, unspecified: Secondary | ICD-10-CM | POA: Diagnosis not present

## 2023-06-24 DIAGNOSIS — Z992 Dependence on renal dialysis: Secondary | ICD-10-CM | POA: Diagnosis not present

## 2023-06-24 DIAGNOSIS — N2581 Secondary hyperparathyroidism of renal origin: Secondary | ICD-10-CM | POA: Diagnosis not present

## 2023-06-24 DIAGNOSIS — N186 End stage renal disease: Secondary | ICD-10-CM | POA: Diagnosis not present

## 2023-06-27 DIAGNOSIS — N186 End stage renal disease: Secondary | ICD-10-CM | POA: Diagnosis not present

## 2023-06-27 DIAGNOSIS — Z992 Dependence on renal dialysis: Secondary | ICD-10-CM | POA: Diagnosis not present

## 2023-06-27 DIAGNOSIS — N2581 Secondary hyperparathyroidism of renal origin: Secondary | ICD-10-CM | POA: Diagnosis not present

## 2023-06-29 DIAGNOSIS — Z992 Dependence on renal dialysis: Secondary | ICD-10-CM | POA: Diagnosis not present

## 2023-06-29 DIAGNOSIS — N2581 Secondary hyperparathyroidism of renal origin: Secondary | ICD-10-CM | POA: Diagnosis not present

## 2023-06-29 DIAGNOSIS — N186 End stage renal disease: Secondary | ICD-10-CM | POA: Diagnosis not present

## 2023-07-01 DIAGNOSIS — Z8701 Personal history of pneumonia (recurrent): Secondary | ICD-10-CM | POA: Diagnosis not present

## 2023-07-01 DIAGNOSIS — I120 Hypertensive chronic kidney disease with stage 5 chronic kidney disease or end stage renal disease: Secondary | ICD-10-CM | POA: Diagnosis not present

## 2023-07-01 DIAGNOSIS — N2581 Secondary hyperparathyroidism of renal origin: Secondary | ICD-10-CM | POA: Diagnosis not present

## 2023-07-01 DIAGNOSIS — M3214 Glomerular disease in systemic lupus erythematosus: Secondary | ICD-10-CM | POA: Diagnosis not present

## 2023-07-01 DIAGNOSIS — N185 Chronic kidney disease, stage 5: Secondary | ICD-10-CM | POA: Diagnosis not present

## 2023-07-01 DIAGNOSIS — N186 End stage renal disease: Secondary | ICD-10-CM | POA: Diagnosis not present

## 2023-07-01 DIAGNOSIS — M810 Age-related osteoporosis without current pathological fracture: Secondary | ICD-10-CM | POA: Diagnosis not present

## 2023-07-01 DIAGNOSIS — Z992 Dependence on renal dialysis: Secondary | ICD-10-CM | POA: Diagnosis not present

## 2023-07-03 ENCOUNTER — Ambulatory Visit (HOSPITAL_COMMUNITY)
Admission: RE | Admit: 2023-07-03 | Discharge: 2023-07-03 | Disposition: A | Discharge status: Home / Self Care | Attending: Vascular Surgery | Admitting: Vascular Surgery

## 2023-07-03 ENCOUNTER — Encounter (HOSPITAL_COMMUNITY)
Admission: RE | Disposition: A | Discharge status: Home / Self Care | Payer: Self-pay | Source: Home / Self Care | Attending: Vascular Surgery

## 2023-07-03 ENCOUNTER — Other Ambulatory Visit: Payer: Self-pay

## 2023-07-03 DIAGNOSIS — N186 End stage renal disease: Secondary | ICD-10-CM | POA: Diagnosis not present

## 2023-07-03 DIAGNOSIS — Y713 Surgical instruments, materials and cardiovascular devices (including sutures) associated with adverse incidents: Secondary | ICD-10-CM | POA: Insufficient documentation

## 2023-07-03 DIAGNOSIS — Z992 Dependence on renal dialysis: Secondary | ICD-10-CM

## 2023-07-03 DIAGNOSIS — T82398A Other mechanical complication of other vascular grafts, initial encounter: Secondary | ICD-10-CM | POA: Insufficient documentation

## 2023-07-03 DIAGNOSIS — I120 Hypertensive chronic kidney disease with stage 5 chronic kidney disease or end stage renal disease: Secondary | ICD-10-CM | POA: Insufficient documentation

## 2023-07-03 DIAGNOSIS — T82898A Other specified complication of vascular prosthetic devices, implants and grafts, initial encounter: Secondary | ICD-10-CM

## 2023-07-03 DIAGNOSIS — N184 Chronic kidney disease, stage 4 (severe): Secondary | ICD-10-CM

## 2023-07-03 DIAGNOSIS — S82101D Unspecified fracture of upper end of right tibia, subsequent encounter for closed fracture with routine healing: Secondary | ICD-10-CM | POA: Diagnosis not present

## 2023-07-03 HISTORY — PX: TUNNELLED CATHETER EXCHANGE: CATH118373

## 2023-07-03 SURGERY — TUNNELLED CATHETER EXCHANGE
Anesthesia: LOCAL

## 2023-07-03 MED ORDER — LIDOCAINE HCL (PF) 1 % IJ SOLN
INTRAMUSCULAR | Status: AC
  Filled 2023-07-03: qty 30

## 2023-07-03 MED ORDER — HEPARIN SODIUM (PORCINE) 1000 UNIT/ML IJ SOLN
INTRAMUSCULAR | Status: AC
  Filled 2023-07-03: qty 10

## 2023-07-03 MED ORDER — LIDOCAINE HCL (PF) 1 % IJ SOLN
INTRAMUSCULAR | Status: DC | PRN
Start: 2023-07-03 — End: 2023-07-03
  Administered 2023-07-03: 20 mL via INTRADERMAL

## 2023-07-03 MED ORDER — HEPARIN SODIUM (PORCINE) 1000 UNIT/ML IJ SOLN
INTRAMUSCULAR | Status: DC | PRN
  Administered 2023-07-03: 5000 [IU] via INTRAVENOUS

## 2023-07-03 MED ORDER — HEPARIN (PORCINE) IN NACL 1000-0.9 UT/500ML-% IV SOLN
INTRAVENOUS | Status: DC | PRN
  Administered 2023-07-03: 500 mL

## 2023-07-03 SURGICAL SUPPLY — 5 items
CATH PALINDROME-P 23 W/VT (CATHETERS) IMPLANT
GUIDEWIRE LT ZIPWIRE 035X260 (WIRE) IMPLANT
KIT PV (KITS) ×1 IMPLANT
SHEATH PROBE COVER 6X72 (BAG) IMPLANT
TRAY PV CATH (CUSTOM PROCEDURE TRAY) ×1 IMPLANT

## 2023-07-03 NOTE — Progress Notes (Signed)
 [facility] [place]  ASSESSMENT / PLAN: 68 y.o. female with ESRD on HD via [facility]J TDC. Her TDC is performing poorly. We will plan to exchange the [facility] today in the cath lab. Patient would like to pursue peritoneal dialysis, for which I think she would be a good candidate.   CHIEF COMPLAINT: poorly functioning TDC  HISTORY OF PRESENT ILLNESS: Colleen Obrien is a 68 y.o. female with ESRD on HD via LIJ TDC. The [facility] is not performing well at dialysis sessions. She would like to pursue peritoneal dialysis. She is scheduled to see our group later in the month to discuss this, but I offered her evaluation for this today. She reports no prior history of major abdominal surgery. She is right handed.  Past Medical History:  Diagnosis Date   Anemia in chronic kidney disease (CKD)    Arthritis    Asthma    Blood dyscrasia    systemic lupus   Chronic renal disease, stage 5, glomerular filtration rate less than or equal to 15 mL/min/1.73 square meter (HCC)    Dyspnea    GERD (gastroesophageal reflux disease)    Gout    Hypertension    Lupus    Narcolepsy    Osteoporosis 05/05/2022   Pneumonia    x 1   Sleep apnea    tries to use CPAP nightly as of 04/29/22   Walker as Engineer, water dependent    when going outside home    Past Surgical History:  Procedure Laterality Date   CESAREAN SECTION     x 2   COLONOSCOPY     DIALYSIS/PERMA CATHETER INSERTION N/A 03/07/2023   Procedure: DIALYSIS/PERMA CATHETER INSERTION;  Surgeon: Baron Border, MD;  [facility];  Service: Cardiovascular;  Laterality: N/A;   ganglion cyst removed Right    arm   TIBIA IM NAIL INSERTION Right 05/03/2022   Procedure: INTRAMEDULLARY (IM) NAIL TIBIAL;  Surgeon: Hardy Lia, MD;  [facility] OR;  Service: Orthopedics;  Laterality: Right;   TOTAL HIP ARTHROPLASTY Left 08/26/2019   Procedure: LEFT TOTAL HIP ARTHROPLASTY ANTERIOR APPROACH;  Surgeon: Wes Hamman, MD;  Location[facility] OR;  Service: Orthopedics;  Laterality: Left;   TUBAL LIGATION      Family History  Problem Relation Age of Onset   Hypertension Mother    Diabetes Mother    Dementia Father    Hypertension Father    Hyperlipidemia Father    Diabetes Paternal Grandmother    Alcohol abuse Paternal Grandfather     Social History   Socioeconomic History   Marital status: Married    Spouse name: Not on file   Number of children: 2   Years of education: College   Highest education level: Not on file  Occupational History   Occupation: Cytotechnologists  Tobacco Use   Smoking status: Never   Smokeless tobacco: Never  Vaping Use   Vaping status: Never Used  Substance and Sexual Activity   Alcohol use: No   Drug use: No   Sexual activity: Not on file  Other Topics Concern   Not on file  Social History Narrative   Lives at home with her husband.   Right-handed.   2-3 cups caffeine daily.   Social Drivers of Corporate investment banker Strain: Not on file  Food Insecurity: No Food Insecurity (09/19/2022)   Hunger Vital Sign    Worried About Running Out of Food  in the Last Year: Never true    Ran Out of Food in the Last Year: Never true  Transportation Needs: No Transportation Needs (09/19/2022)   PRAPARE - Administrator, Civil Service (Medical): No    Lack of Transportation (Non-Medical): No  Physical Activity: Not on file  Stress: Not on file  Social Connections: Not on file  Intimate Partner Violence: Unknown (09/20/2022)   Humiliation, Afraid, Rape, and Kick questionnaire    Fear of Current or Ex-Partner: No    Emotionally Abused: No    Physically Abused: Patient declined    Sexually Abused: Patient declined    Allergies  Allergen Reactions   Erythromycin Nausea And Vomiting   Levofloxacin     Causes muscle pain    Sulfa Antibiotics Hives    Current Facility-Administered Medications  Medication Dose Route Frequency Provider Last  Rate Last Admin   Heparin  (Porcine) in NaCl 1000-0.9 UT/500ML-% SOLN    PRN Carlene Che, MD   500 mL at 07/03/23 1205   heparin  sodium (porcine) injection    PRN Carlene Che, MD   5,000 Units at 07/03/23 1216   lidocaine  (PF) (XYLOCAINE ) 1 % injection    PRN Carlene Che, MD   20 mL at 07/03/23 1209    PHYSICAL EXAM Vitals:   07/03/23 1226 07/03/23 1228 07/03/23 1230 07/03/23 1235  BP:    103/74  Pulse: (!) 0 (!) 0 (!) 0 72  Resp:    14  Temp:      TempSrc:      SpO2:    94%   Well appearing woman in no distress Regular rate and rhythm.  Unlabored breathing LIJ TDC in place.  PERTINENT LABORATORY AND RADIOLOGIC DATA  Most recent CBC    Latest Ref Rng & Units 02/21/2023   12:45 PM 01/28/2023   11:19 AM 01/24/2023    1:11 PM  CBC  WBC 4.0 - 10.5 K/uL  9.7    Hemoglobin 12.0 - 15.0 g/dL 8.5  9.2  9.3   Hematocrit 36.0 - 46.0 %  29.0    Platelets 150 - 400 K/uL  211       Most recent CMP    Latest Ref Rng & Units 02/21/2023   12:37 PM 01/28/2023   11:19 AM 01/24/2023    1:05 PM  CMP  Glucose 70 - 99 mg/dL 366  96  92   BUN 8 - 23 mg/dL 84  73  87   Creatinine 0.44 - 1.00 mg/dL 4.40  3.47  4.25   Sodium 135 - 145 mmol/L 136  138  135   Potassium 3.5 - 5.1 mmol/L 4.7  3.7  4.1   Chloride 98 - 111 mmol/L 109  111  107   CO2 22 - 32 mmol/L 15  16  15    Calcium 8.9 - 10.3 mg/dL 8.7 - 95.6 mg/dL 38.7    9.9  56.4  33.2   Total Protein 6.5 - 8.1 g/dL  6.9    Total Bilirubin <1.2 mg/dL  0.7    Alkaline Phos 38 - 126 U/L  128    AST 15 - 41 U/L  22    ALT 0 - 44 U/L  19     [name] N. Edgardo Goodwill, MD [facility] of [facility] Phone Number: [phone] 07/03/2023 12:46 PM   Total time spent on preparing this encounter including chart review, data review, collecting history, examining the  patient, and coordinating care: 40 minutes.   Portions of this report may have been transcribed using voice recognition software.  Every  effort has been made to ensure accuracy; however, inadvertent computerized transcription errors may still be present.

## 2023-07-03 NOTE — Op Note (Signed)
 DATE OF SERVICE: 07/03/2023  PATIENT:  Colleen Obrien  68 y.o. female  PRE-OPERATIVE DIAGNOSIS:  ESRD  POST-OPERATIVE DIAGNOSIS:  Same  PROCEDURE:   Exchange of left internal jugular tunneled dialysis catheter.   SURGEON:  Surgeons and Role:    * Carlene Che, MD - Primary  ASSISTANT: none  ANESTHESIA:   local  EBL: minimal  BLOOD ADMINISTERED:none  DRAINS: none   LOCAL MEDICATIONS USED:  LIDOCAINE    SPECIMEN:  none  COUNTS: confirmed correct.  TOURNIQUET:  none  PATIENT DISPOSITION:  PACU - hemodynamically stable.   Delay start of Pharmacological VTE agent (>24hrs) due to surgical blood loss or risk of bleeding: no  INDICATION FOR PROCEDURE: Colleen Obrien is a 68 y.o. female with ESRD. She is currently dialyzing via TDC. The [facility] is not working well at HD. After careful discussion of risks, benefits, and alternatives the patient was offered [facility] exchange. The patient understood and wished to proceed.  OPERATIVE FINDINGS: successful exchange of 28cm TDC for 23cm TDC  DESCRIPTION OF PROCEDURE: After identification of the patient in the pre-operative holding area, the patient was transferred to the operating room. The patient was positioned supine on the operating room table. Anesthesia was induced. The left neck and chest was prepped and draped in standard fashion. A surgical pause was performed confirming correct patient, procedure, and operative location.  Generous infiltration of 1% lidocaine  was made around the existing [facility] in its cuff.  Scar tissue between the chest wall and the felt cuff of the existing TDC was sharply taken down with scissor.  After the cuff was freed, a zip wire was advanced into the inferior vena cava.  The catheter was removed over the wire and manual pressure held over the left internal jugular venotomy.  A new 23 cm tunneled dialysis catheter was prepared per manufacturer's instructions with stylette's to allow threading the  catheter over the wire.  The catheter threaded into place without difficulty.  The catheter was tested and functioning well.  The catheter was secured to the chest wall with 2 interrupted 2-0 nylon sutures.  Upon completion of the case instrument and sharps counts were confirmed correct. The patient was transferred to the PACU in good condition. I was present for all portions of the procedure.  FOLLOW UP PLAN: Peritoneal dialysis catheter placement in the operating room as the OR schedule allows.  Colleen Little. Edgardo Goodwill, MD [facility] Special[facility] of [facility] Phone Number: [phone] 07/03/2023 12:52 PM

## 2023-07-04 ENCOUNTER — Telehealth: Payer: Self-pay

## 2023-07-04 DIAGNOSIS — Z992 Dependence on renal dialysis: Secondary | ICD-10-CM | POA: Diagnosis not present

## 2023-07-04 DIAGNOSIS — N2581 Secondary hyperparathyroidism of renal origin: Secondary | ICD-10-CM | POA: Diagnosis not present

## 2023-07-04 DIAGNOSIS — N186 End stage renal disease: Secondary | ICD-10-CM | POA: Diagnosis not present

## 2023-07-04 NOTE — Telephone Encounter (Signed)
 Attempted to call for surgery scheduling. LVM

## 2023-07-05 NOTE — H&P (Signed)
 See same day progress note for H&P details.   Colleen Obrien. Colleen Goodwill, MD [place] [facility] of [facility] Phone Number: [phone] 07/05/2023 8:40 AM

## 2023-07-06 ENCOUNTER — Encounter (HOSPITAL_COMMUNITY): Payer: Self-pay | Admitting: Vascular Surgery

## 2023-07-06 ENCOUNTER — Telehealth: Payer: Self-pay

## 2023-07-06 ENCOUNTER — Ambulatory Visit: Payer: Self-pay | Admitting: Surgery

## 2023-07-06 DIAGNOSIS — Z992 Dependence on renal dialysis: Secondary | ICD-10-CM | POA: Diagnosis not present

## 2023-07-06 DIAGNOSIS — N186 End stage renal disease: Secondary | ICD-10-CM | POA: Diagnosis not present

## 2023-07-06 DIAGNOSIS — N2581 Secondary hyperparathyroidism of renal origin: Secondary | ICD-10-CM | POA: Diagnosis not present

## 2023-07-06 NOTE — Telephone Encounter (Signed)
 Attempted to call for surgery scheduling. LVM

## 2023-07-08 DIAGNOSIS — N186 End stage renal disease: Secondary | ICD-10-CM | POA: Diagnosis not present

## 2023-07-08 DIAGNOSIS — N2581 Secondary hyperparathyroidism of renal origin: Secondary | ICD-10-CM | POA: Diagnosis not present

## 2023-07-08 DIAGNOSIS — Z992 Dependence on renal dialysis: Secondary | ICD-10-CM | POA: Diagnosis not present

## 2023-07-11 ENCOUNTER — Telehealth: Payer: Self-pay

## 2023-07-11 DIAGNOSIS — N186 End stage renal disease: Secondary | ICD-10-CM | POA: Diagnosis not present

## 2023-07-11 DIAGNOSIS — Z992 Dependence on renal dialysis: Secondary | ICD-10-CM | POA: Diagnosis not present

## 2023-07-11 DIAGNOSIS — N2581 Secondary hyperparathyroidism of renal origin: Secondary | ICD-10-CM | POA: Diagnosis not present

## 2023-07-11 NOTE — Telephone Encounter (Signed)
 Attempted to call for surgery scheduling. LVM

## 2023-07-12 DIAGNOSIS — M25569 Pain in unspecified knee: Secondary | ICD-10-CM | POA: Diagnosis not present

## 2023-07-12 DIAGNOSIS — M109 Gout, unspecified: Secondary | ICD-10-CM | POA: Diagnosis not present

## 2023-07-12 DIAGNOSIS — M25559 Pain in unspecified hip: Secondary | ICD-10-CM | POA: Diagnosis not present

## 2023-07-12 DIAGNOSIS — R2681 Unsteadiness on feet: Secondary | ICD-10-CM | POA: Diagnosis not present

## 2023-07-13 DIAGNOSIS — Z992 Dependence on renal dialysis: Secondary | ICD-10-CM | POA: Diagnosis not present

## 2023-07-13 DIAGNOSIS — N186 End stage renal disease: Secondary | ICD-10-CM | POA: Diagnosis not present

## 2023-07-13 DIAGNOSIS — N2581 Secondary hyperparathyroidism of renal origin: Secondary | ICD-10-CM | POA: Diagnosis not present

## 2023-07-14 DIAGNOSIS — M109 Gout, unspecified: Secondary | ICD-10-CM | POA: Diagnosis not present

## 2023-07-14 DIAGNOSIS — R2681 Unsteadiness on feet: Secondary | ICD-10-CM | POA: Diagnosis not present

## 2023-07-14 DIAGNOSIS — M25559 Pain in unspecified hip: Secondary | ICD-10-CM | POA: Diagnosis not present

## 2023-07-14 DIAGNOSIS — M25569 Pain in unspecified knee: Secondary | ICD-10-CM | POA: Diagnosis not present

## 2023-07-15 DIAGNOSIS — N2581 Secondary hyperparathyroidism of renal origin: Secondary | ICD-10-CM | POA: Diagnosis not present

## 2023-07-15 DIAGNOSIS — Z992 Dependence on renal dialysis: Secondary | ICD-10-CM | POA: Diagnosis not present

## 2023-07-15 DIAGNOSIS — N186 End stage renal disease: Secondary | ICD-10-CM | POA: Diagnosis not present

## 2023-07-18 DIAGNOSIS — N2581 Secondary hyperparathyroidism of renal origin: Secondary | ICD-10-CM | POA: Diagnosis not present

## 2023-07-18 DIAGNOSIS — N186 End stage renal disease: Secondary | ICD-10-CM | POA: Diagnosis not present

## 2023-07-18 DIAGNOSIS — Z992 Dependence on renal dialysis: Secondary | ICD-10-CM | POA: Diagnosis not present

## 2023-07-19 ENCOUNTER — Encounter: Payer: Self-pay | Admitting: Vascular Surgery

## 2023-07-19 ENCOUNTER — Ambulatory Visit: Attending: Vascular Surgery | Admitting: Vascular Surgery

## 2023-07-19 VITALS — BP 116/80 | HR 75 | Temp 98.4°F | Ht 62.0 in | Wt 173.0 lb

## 2023-07-19 DIAGNOSIS — N186 End stage renal disease: Secondary | ICD-10-CM

## 2023-07-19 NOTE — Progress Notes (Signed)
 Patient ID: Colleen Obrien, female   DOB: [birth year], 68 y.o.   MRN: [id number]  Reason for Consult: Follow-up   Referred by Charletta Cons, MD  Subjective:     HPI:  Colleen Obrien is a 68 y.o. female with history of end-stage on dialysis via tunneled dialysis catheter since February.  She has been evaluated in the past and was thought to require AV graft and thus no AV fistula was ever placed and she has never had upper extremity access.  She is here today to discuss peritoneal dialysis access.  She has 2 previous C-sections no other abdominal surgeries.  She is accompanied by her husband today.  She dialyzes Tuesdays, Thursdays and Saturdays and does not take any blood thinners.  Past Medical History:  Diagnosis Date   Anemia in chronic kidney disease (CKD)    Arthritis    Asthma    Blood dyscrasia    systemic lupus   Chronic renal disease, stage 5, glomerular filtration rate less than or equal to 15 mL/min/1.73 square meter (HCC)    Dyspnea    GERD (gastroesophageal reflux disease)    Gout    Hypertension    Lupus    Narcolepsy    Osteoporosis 05/05/2022   Pneumonia    x 1   Sleep apnea    tries to use CPAP nightly as of 04/29/22   Walker as Engineer, water dependent    when going outside home   Family History  Problem Relation Age of Onset   Hypertension Mother    Diabetes Mother    Dementia Father    Hypertension Father    Hyperlipidemia Father    Diabetes Paternal Grandmother    Alcohol abuse Paternal Grandfather    Past Surgical History:  Procedure Laterality Date   CESAREAN SECTION     x 2   COLONOSCOPY     DIALYSIS/PERMA CATHETER INSERTION N/A 03/07/2023   Procedure: DIALYSIS/PERMA CATHETER INSERTION;  Surgeon: Baron Border, MD;  [facility];  Service: Cardiovascular;  Laterality: N/A;   ganglion cyst removed Right    arm   TIBIA IM NAIL INSERTION Right 05/03/2022   Procedure: INTRAMEDULLARY (IM)  NAIL TIBIAL;  Surgeon: Hardy Lia, MD;  Location: [facility] OR;  Service: Orthopedics;  Laterality: Right;   TOTAL HIP ARTHROPLASTY Left 08/26/2019   Procedure: LEFT TOTAL HIP ARTHROPLASTY ANTERIOR APPROACH;  Surgeon: Wes Hamman, MD;  Location: [facility] OR;  Service: Orthopedics;  Laterality: Left;   TUBAL LIGATION     TUNNELLED CATHETER EXCHANGE N/A 07/03/2023   Procedure: TUNNELLED CATHETER EXCHANGE;  Surgeon: Carlene Che, MD;  Location: [facility] PV LAB;  Service: Cardiovascular;  Laterality: N/A;    Short Social History:  Social History   Tobacco Use   Smoking status: Never   Smokeless tobacco: Never  Substance Use Topics   Alcohol use: No    Allergies  Allergen Reactions   Erythromycin Nausea And Vomiting   Levofloxacin     Causes muscle pain    Sulfa Antibiotics Hives    Current Outpatient Medications  Medication Sig Dispense Refill   acetaminophen  (TYLENOL ) 500 MG tablet Take 500 mg by mouth every 6 (six) hours as needed (for pain.).     albuterol  (PROVENTIL  HFA;VENTOLIN  HFA) 108 (90 BASE) MCG/ACT inhaler Inhale 2 puffs into the lungs every 6 (six) hours as needed for wheezing or shortness of breath.     allopurinol  (ZYLOPRIM ) 300 MG  tablet Take 300 mg by mouth daily.     budesonide -formoterol  (SYMBICORT) 80-4.5 MCG/ACT inhaler Inhale 2 puffs into the lungs 2 (two) times daily as needed (asthma).     cyanocobalamin 1000 MCG tablet Take 1,000 mcg by mouth daily.     felodipine  (PLENDIL ) 10 MG 24 hr tablet Take 10 mg by mouth daily.     ferrous sulfate 325 (65 FE) MG tablet Take 325 mg by mouth every other day.     furosemide  (LASIX ) 40 MG tablet Take 1 tablet (40 mg total) by mouth daily as needed (weight gain of 3 lbs in 1 day or 5 lbs over 1 week.).     hydroxychloroquine  (PLAQUENIL ) 200 MG tablet Take 200 mg by mouth daily.      labetalol  (NORMODYNE ) 300 MG tablet Take 300 mg by mouth 2 (two) times daily.     methocarbamol  (ROBAXIN ) 500 MG tablet Take 0.5 tablets (250 mg total)  by mouth every 8 (eight) hours as needed for muscle spasms. 10 tablet 0   pantoprazole (PROTONIX) 40 MG tablet Take 40 mg by mouth at bedtime as needed (acid reflux).     No current facility-administered medications for this visit.    Review of Systems  Constitutional:  Constitutional negative. HENT: HENT negative.  Eyes: Eyes negative.  Respiratory: Respiratory negative.  Cardiovascular: Cardiovascular negative.  GI: Gastrointestinal negative.  Musculoskeletal: Positive for joint pain.  Skin: Skin negative.  Neurological: Positive for numbness and syncope.  Hematologic: Hematologic/lymphatic negative.  Psychiatric: Psychiatric negative.        Objective:  Objective   Vitals:   07/19/23 0928  BP: 116/80  Pulse: 75  Temp: 98.4 F (36.9 C)  SpO2: 94%  Weight: 173 lb (78.5 kg)  Height: 5' 2 (1.575 m)   Body mass index is 31.64 kg/m.  Physical Exam HENT:     Head: Normocephalic.     Nose: Nose normal.     Mouth/Throat:     Mouth: Mucous membranes are moist.   Eyes:     Pupils: Pupils are equal, round, and reactive to light.   Pulmonary:     Effort: Pulmonary effort is normal.  Abdominal:     General: Abdomen is flat.     Palpations: Abdomen is soft.     Comments: Previous C-section incision well-healed   Musculoskeletal:        General: Normal range of motion.   Skin:    General: Skin is warm.     Capillary Refill: Capillary refill takes less than 2 seconds.   Neurological:     General: No focal deficit present.     Mental Status: She is alert.   Psychiatric:        Mood and Affect: Mood normal.        Thought Content: Thought content normal.        Judgment: Judgment normal.     Data: No studies     Assessment/Plan:     68 year old female with end-stage renal disease on dialysis via catheter.  She is here today to discuss peritoneal dialysis catheter placement.  I have discussed the risk benefits alternatives and mostly focused on injury to  intra-abdominal organs and primary nonfunction of the catheter up to and requiring removal versus revision of the catheter.  They demonstrate good understanding and will call to schedule on a nondialysis day in the near future.     Adine Hoof MD [facility] of [facility]

## 2023-07-20 ENCOUNTER — Telehealth: Payer: Self-pay

## 2023-07-20 DIAGNOSIS — Z992 Dependence on renal dialysis: Secondary | ICD-10-CM | POA: Diagnosis not present

## 2023-07-20 DIAGNOSIS — N2581 Secondary hyperparathyroidism of renal origin: Secondary | ICD-10-CM | POA: Diagnosis not present

## 2023-07-20 DIAGNOSIS — N186 End stage renal disease: Secondary | ICD-10-CM | POA: Diagnosis not present

## 2023-07-20 NOTE — Telephone Encounter (Signed)
 Attempted to call for surgery scheduling. LVM

## 2023-07-21 DIAGNOSIS — M25569 Pain in unspecified knee: Secondary | ICD-10-CM | POA: Diagnosis not present

## 2023-07-21 DIAGNOSIS — N185 Chronic kidney disease, stage 5: Secondary | ICD-10-CM | POA: Diagnosis not present

## 2023-07-21 DIAGNOSIS — M25559 Pain in unspecified hip: Secondary | ICD-10-CM | POA: Diagnosis not present

## 2023-07-21 DIAGNOSIS — M109 Gout, unspecified: Secondary | ICD-10-CM | POA: Diagnosis not present

## 2023-07-21 DIAGNOSIS — J302 Other seasonal allergic rhinitis: Secondary | ICD-10-CM | POA: Diagnosis not present

## 2023-07-21 DIAGNOSIS — R2681 Unsteadiness on feet: Secondary | ICD-10-CM | POA: Diagnosis not present

## 2023-07-21 DIAGNOSIS — K219 Gastro-esophageal reflux disease without esophagitis: Secondary | ICD-10-CM | POA: Diagnosis not present

## 2023-07-21 DIAGNOSIS — J4520 Mild intermittent asthma, uncomplicated: Secondary | ICD-10-CM | POA: Diagnosis not present

## 2023-07-22 DIAGNOSIS — N186 End stage renal disease: Secondary | ICD-10-CM | POA: Diagnosis not present

## 2023-07-22 DIAGNOSIS — N2581 Secondary hyperparathyroidism of renal origin: Secondary | ICD-10-CM | POA: Diagnosis not present

## 2023-07-22 DIAGNOSIS — Z992 Dependence on renal dialysis: Secondary | ICD-10-CM | POA: Diagnosis not present

## 2023-07-25 DIAGNOSIS — N2581 Secondary hyperparathyroidism of renal origin: Secondary | ICD-10-CM | POA: Diagnosis not present

## 2023-07-25 DIAGNOSIS — Z992 Dependence on renal dialysis: Secondary | ICD-10-CM | POA: Diagnosis not present

## 2023-07-25 DIAGNOSIS — N186 End stage renal disease: Secondary | ICD-10-CM | POA: Diagnosis not present

## 2023-07-26 DIAGNOSIS — R2681 Unsteadiness on feet: Secondary | ICD-10-CM | POA: Diagnosis not present

## 2023-07-26 DIAGNOSIS — M25559 Pain in unspecified hip: Secondary | ICD-10-CM | POA: Diagnosis not present

## 2023-07-26 DIAGNOSIS — M25569 Pain in unspecified knee: Secondary | ICD-10-CM | POA: Diagnosis not present

## 2023-07-26 DIAGNOSIS — M109 Gout, unspecified: Secondary | ICD-10-CM | POA: Diagnosis not present

## 2023-07-27 DIAGNOSIS — N186 End stage renal disease: Secondary | ICD-10-CM | POA: Diagnosis not present

## 2023-07-27 DIAGNOSIS — N2581 Secondary hyperparathyroidism of renal origin: Secondary | ICD-10-CM | POA: Diagnosis not present

## 2023-07-27 DIAGNOSIS — Z992 Dependence on renal dialysis: Secondary | ICD-10-CM | POA: Diagnosis not present

## 2023-07-28 ENCOUNTER — Other Ambulatory Visit: Payer: Self-pay

## 2023-07-28 ENCOUNTER — Ambulatory Visit (HOSPITAL_COMMUNITY)
Admission: RE | Admit: 2023-07-28 | Discharge: 2023-07-28 | Disposition: A | Discharge status: Home / Self Care | Attending: Vascular Surgery | Admitting: Vascular Surgery

## 2023-07-28 ENCOUNTER — Encounter (HOSPITAL_COMMUNITY)
Admission: RE | Disposition: A | Discharge status: Home / Self Care | Payer: Self-pay | Source: Home / Self Care | Attending: Vascular Surgery

## 2023-07-28 DIAGNOSIS — T8249XA Other complication of vascular dialysis catheter, initial encounter: Secondary | ICD-10-CM | POA: Diagnosis not present

## 2023-07-28 DIAGNOSIS — N186 End stage renal disease: Secondary | ICD-10-CM | POA: Diagnosis not present

## 2023-07-28 DIAGNOSIS — Z992 Dependence on renal dialysis: Secondary | ICD-10-CM | POA: Insufficient documentation

## 2023-07-28 DIAGNOSIS — Y839 Surgical procedure, unspecified as the cause of abnormal reaction of the patient, or of later complication, without mention of misadventure at the time of the procedure: Secondary | ICD-10-CM | POA: Insufficient documentation

## 2023-07-28 DIAGNOSIS — Z79899 Other long term (current) drug therapy: Secondary | ICD-10-CM | POA: Diagnosis not present

## 2023-07-28 DIAGNOSIS — T82898A Other specified complication of vascular prosthetic devices, implants and grafts, initial encounter: Secondary | ICD-10-CM | POA: Diagnosis not present

## 2023-07-28 DIAGNOSIS — I120 Hypertensive chronic kidney disease with stage 5 chronic kidney disease or end stage renal disease: Secondary | ICD-10-CM | POA: Diagnosis not present

## 2023-07-28 HISTORY — PX: TUNNELLED CATHETER EXCHANGE: CATH118373

## 2023-07-28 SURGERY — TUNNELLED CATHETER EXCHANGE
Anesthesia: LOCAL

## 2023-07-28 MED ORDER — HEPARIN SODIUM (PORCINE) 1000 UNIT/ML IJ SOLN
INTRAMUSCULAR | Status: AC
  Filled 2023-07-28: qty 10

## 2023-07-28 MED ORDER — LIDOCAINE HCL (PF) 1 % IJ SOLN
INTRAMUSCULAR | Status: AC
  Filled 2023-07-28: qty 30

## 2023-07-28 MED ORDER — HEPARIN SODIUM (PORCINE) 1000 UNIT/ML IJ SOLN
INTRAMUSCULAR | Status: DC | PRN
  Administered 2023-07-28 (×2): 1900 [IU] via INTRAVENOUS

## 2023-07-28 MED ORDER — HEPARIN (PORCINE) IN NACL 1000-0.9 UT/500ML-% IV SOLN
INTRAVENOUS | Status: DC | PRN
  Administered 2023-07-28: 500 mL

## 2023-07-28 MED ORDER — LIDOCAINE HCL (PF) 1 % IJ SOLN
INTRAMUSCULAR | Status: DC | PRN
  Administered 2023-07-28: 10 mL

## 2023-07-28 SURGICAL SUPPLY — 4 items
CATH PALINDROME-P 23 W/VT (CATHETERS) IMPLANT
CATH PALINDROME-P 28CM W/VT (CATHETERS) IMPLANT
GUIDEWIRE LT ZIPWIRE 035X260 (WIRE) IMPLANT
TRAY PV CATH (CUSTOM PROCEDURE TRAY) ×1 IMPLANT

## 2023-07-28 NOTE — Op Note (Signed)
    Patient name: Colleen Obrien MRN: [id number] DOB: 10-Aug-1955 Sex: female  07/28/2023 Pre-operative Diagnosis: Poorly functioning left IJ TDC Post-operative diagnosis:  Same Surgeon:  Lonni DOROTHA Gaskins, MD Procedure Performed: 1.  Catheter exchange of left IJ TDC (23 cm palindrome)  Indications: Patient is a 68 year old female with end-stage renal disease that presents with a malfunctioning left IJ TDC.  She presents for catheter exchange after risk benefits discussed.  Findings: The existing left IJ TDC catheter tip looked short of the right atrium.  Over a wire I exchanged for a longer 28 cm tunneled palindrome catheter but this was too long so then I exchanged for a new 23 cm palindrome catheter with the tip further into the right atrium.  This flushed and aspirated easily.   Procedure:  The patient was identified in the holding area and taken to [facility] PV lab.  Placed on the table in the supine position.  Prepped and draped in standard sterile fashion including the catheter on the chest wall.  Timeout was performed.  I used 1% lidocaine  without epinephrine to anesthetize the catheter exit site.  I then inserted a stiff Glidewire and removed the previous catheter.  The existing catheter looked short and the tip appeared in the SVC.  I then exchanged for a new 28 cm palindrome catheter over the wire under fluoroscopy.  This was unfortunately was too long.  I then exchanged for a new 23 cm palindrome catheter over the wire but I advanced the tip further into the right atrium.  The wire was removed and the catheter did not have any kinking.  It flushed and aspirated easily.  It was secured with a 2-0 nylon.  It was loaded with heparinized saline according to manufacturer recommendation.  Taken to holding in stable condition.     Lonni DOROTHA Gaskins, MD [facility] of [place] Office: [phone]

## 2023-07-28 NOTE — H&P (Signed)
 H&P     MRN #:  [id number]  History of Present Illness: This is a 68 y.o. female with history of ESRD that presents for catheter exchange she states this was placed earlier this year and recently exchanged by Dr. Magda.  Past Medical History:  Diagnosis Date   Anemia in chronic kidney disease (CKD)    Arthritis    Asthma    Blood dyscrasia    systemic lupus   Chronic renal disease, stage 5, glomerular filtration rate less than or equal to 15 mL/min/1.73 square meter (HCC)    Dyspnea    GERD (gastroesophageal reflux disease)    Gout    Hypertension    Lupus    Narcolepsy    Osteoporosis 05/05/2022   Pneumonia    x 1   Sleep apnea    tries to use CPAP nightly as of 04/29/22   Walker as Engineer, water dependent    when going outside home    Past Surgical History:  Procedure Laterality Date   CESAREAN SECTION     x 2   COLONOSCOPY     DIALYSIS/PERMA CATHETER INSERTION N/A 03/07/2023   Procedure: DIALYSIS/PERMA CATHETER INSERTION;  Surgeon: Norine Manuelita LABOR, MD;  Location: [facility];  Service: Cardiovascular;  Laterality: N/A;   ganglion cyst removed Right    arm   TIBIA IM NAIL INSERTION Right 05/03/2022   Procedure: INTRAMEDULLARY (IM) NAIL TIBIAL;  Surgeon: Celena Sharper, MD;  Location: [facility] OR;  Service: Orthopedics;  Laterality: Right;   TOTAL HIP ARTHROPLASTY Left 08/26/2019   Procedure: LEFT TOTAL HIP ARTHROPLASTY ANTERIOR APPROACH;  Surgeon: Jerri Kay HERO, MD;  Location: [facility] OR;  Service: Orthopedics;  Laterality: Left;   TUBAL LIGATION     TUNNELLED CATHETER EXCHANGE N/A 07/03/2023   Procedure: TUNNELLED CATHETER EXCHANGE;  Surgeon: Magda Debby SAILOR, MD;  Location: [facility];  Service: Cardiovascular;  Laterality: N/A;    Allergies  Allergen Reactions   Erythromycin Nausea And Vomiting   Levofloxacin     Causes muscle pain    Sulfa Antibiotics Hives    Prior to Admission medications   Medication Sig Start Date End Date Taking?  Authorizing Provider  acetaminophen  (TYLENOL ) 500 MG tablet Take 500 mg by mouth every 6 (six) hours as needed (for pain.).    [provider]  albuterol  (PROVENTIL  HFA;VENTOLIN  HFA) 108 (90 BASE) MCG/ACT inhaler Inhale 2 puffs into the lungs every 6 (six) hours as needed for wheezing or shortness of breath.    [provider]  allopurinol  (ZYLOPRIM ) 300 MG tablet Take 300 mg by mouth daily.    [provider]  budesonide -formoterol  (SYMBICORT) 80-4.5 MCG/ACT inhaler Inhale 2 puffs into the lungs 2 (two) times daily as needed (asthma).    [provider]  cyanocobalamin 1000 MCG tablet Take 1,000 mcg by mouth daily.    [provider]  felodipine  (PLENDIL ) 10 MG 24 hr tablet Take 10 mg by mouth daily. 01/30/21   [provider]  ferrous sulfate 325 (65 FE) MG tablet Take 325 mg by mouth every other day.    [provider]  furosemide  (LASIX ) 40 MG tablet Take 1 tablet (40 mg total) by mouth daily as needed (weight gain of 3 lbs in 1 day or 5 lbs over 1 week.). 09/22/22   Tobie Yetta HERO, MD  hydroxychloroquine  (PLAQUENIL ) 200 MG tablet Take 200 mg by mouth daily.     [provider]  labetalol  (NORMODYNE ) 300 MG tablet Take 300 mg by mouth 2 (two) times daily. 04/11/22   [provider]  methocarbamol  (ROBAXIN ) 500 MG tablet Take 0.5 tablets (250 mg total) by mouth every 8 (eight) hours as needed for muscle spasms. 09/22/22   Patel, Pranav M, MD  pantoprazole (PROTONIX) 40 MG tablet Take 40 mg by mouth at bedtime as needed (acid reflux). 01/06/23   [provider]    Social History   Socioeconomic History   Marital status: Married    Spouse name: Not on file   Number of children: 2   Years of education: College   Highest education level: Not on file  Occupational History   Occupation: Cytotechnologists  Tobacco Use   Smoking status: Never   Smokeless tobacco: Never  Vaping Use   Vaping status: Never  Used  Substance and Sexual Activity   Alcohol use: No   Drug use: No   Sexual activity: Not on file  Other Topics Concern   Not on file  Social History Narrative   Lives at home with her husband.   Right-handed.   2-3 cups caffeine daily.   Social Drivers of Corporate investment banker Strain: Not on file  Food Insecurity: No Food Insecurity (09/19/2022)   Hunger Vital Sign    Worried About Running Out of Food in the Last Year: Never true    Ran Out of Food in the Last Year: Never true  Transportation Needs: No Transportation Needs (09/19/2022)   PRAPARE - Administrator, Civil Service (Medical): No    Lack of Transportation (Non-Medical): No  Physical Activity: Not on file  Stress: Not on file  Social Connections: Not on file  Intimate Partner Violence: Unknown (09/20/2022)   Humiliation, Afraid, Rape, and Kick questionnaire    Fear of Current or Ex-Partner: No    Emotionally Abused: No    Physically Abused: Patient declined    Sexually Abused: Patient declined     Family History  Problem Relation Age of Onset   Hypertension Mother    Diabetes Mother    Dementia Father    Hypertension Father    Hyperlipidemia Father    Diabetes Paternal Grandmother    Alcohol abuse Paternal Grandfather     ROS: [x]  Positive   [ ]  Negative   [ ]  All sytems reviewed and are negative  Cardiovascular: []  chest pain/pressure []  palpitations []  SOB lying flat []  DOE []  pain in legs while walking []  pain in legs at rest []  pain in legs at night []  non-healing ulcers []  hx of DVT []  swelling in legs  Pulmonary: []  productive cough []  asthma/wheezing []  home O2  Neurologic: []  weakness in []  arms []  legs []  numbness in []  arms []  legs []  hx of CVA []  mini stroke [] difficulty speaking or slurred speech []  temporary loss of vision in one eye []  dizziness  Hematologic: []  hx of cancer []  bleeding problems []  problems with blood clotting easily  Endocrine:    []  diabetes []  thyroid  disease  GI []  vomiting blood []  blood in stool  GU: []  CKD/renal failure []  HD--[]  M/W/F or []  T/T/S []  burning with urination []  blood in urine  Psychiatric: []  anxiety []  depression  Musculoskeletal: []  arthritis []  joint pain  Integumentary: []  rashes []  ulcers  Constitutional: []  fever []  chills   Physical Examination  There were no vitals filed for this visit. There is no height or weight on file to  calculate BMI.  General:  WDWN in NAD Gait: Not observed HENT: WNL, normocephalic Pulmonary: normal non-labored breathing, without Rales, rhonchi,  wheezing Cardiac: regular, without  Murmurs, rubs or gallops Abdomen:  soft, NT/ND, no masses Vascular Exam/Pulses: Left IJ TDC  CBC    Component Value Date/Time   WBC 9.7 01/28/2023 1119   RBC 3.15 (L) 01/28/2023 1119   HGB 8.5 (L) 02/21/2023 1245   HCT 29.0 (L) 01/28/2023 1119   PLT 211 01/28/2023 1119   MCV 92.1 01/28/2023 1119   MCH 29.2 01/28/2023 1119   MCHC 31.7 01/28/2023 1119   RDW 17.4 (H) 01/28/2023 1119   LYMPHSABS 0.8 01/28/2023 1119   MONOABS 0.8 01/28/2023 1119   EOSABS 0.4 01/28/2023 1119   BASOSABS 0.1 01/28/2023 1119    BMET    Component Value Date/Time   NA 136 02/21/2023 1237   K 4.7 02/21/2023 1237   CL 109 02/21/2023 1237   CO2 15 (L) 02/21/2023 1237   GLUCOSE 110 (H) 02/21/2023 1237   BUN 84 (H) 02/21/2023 1237   CREATININE 7.04 (H) 02/21/2023 1237   CALCIUM 10.3 02/21/2023 1237   CALCIUM 9.9 02/21/2023 1237   GFRNONAA 6 (L) 02/21/2023 1237   GFRAA 11 (L) 08/27/2019 0608    COAGS: Lab Results  Component Value Date   INR 1.0 08/21/2019   INR 0.98 07/13/2016     Non-Invasive Vascular Imaging:      ASSESSMENT/PLAN: This is a 68 y.o. female  with history of ESRD that presents for catheter exchange.  Discussed plan for left IJ catheter exchange due to malfunction.  Questions answered and risks benefits discussed.  Lonni DOROTHA Gaskins,  MD [facility] Office: [phone]  Lonni JINNY Gaskins

## 2023-07-29 DIAGNOSIS — N2581 Secondary hyperparathyroidism of renal origin: Secondary | ICD-10-CM | POA: Diagnosis not present

## 2023-07-29 DIAGNOSIS — N186 End stage renal disease: Secondary | ICD-10-CM | POA: Diagnosis not present

## 2023-07-29 DIAGNOSIS — Z992 Dependence on renal dialysis: Secondary | ICD-10-CM | POA: Diagnosis not present

## 2023-07-31 ENCOUNTER — Encounter (HOSPITAL_COMMUNITY): Payer: Self-pay | Admitting: Vascular Surgery

## 2023-07-31 DIAGNOSIS — M3214 Glomerular disease in systemic lupus erythematosus: Secondary | ICD-10-CM | POA: Diagnosis not present

## 2023-07-31 DIAGNOSIS — S82101D Unspecified fracture of upper end of right tibia, subsequent encounter for closed fracture with routine healing: Secondary | ICD-10-CM | POA: Diagnosis not present

## 2023-07-31 DIAGNOSIS — N186 End stage renal disease: Secondary | ICD-10-CM | POA: Diagnosis not present

## 2023-07-31 DIAGNOSIS — M329 Systemic lupus erythematosus, unspecified: Secondary | ICD-10-CM | POA: Diagnosis not present

## 2023-07-31 DIAGNOSIS — M810 Age-related osteoporosis without current pathological fracture: Secondary | ICD-10-CM | POA: Diagnosis not present

## 2023-07-31 DIAGNOSIS — G8929 Other chronic pain: Secondary | ICD-10-CM | POA: Diagnosis not present

## 2023-07-31 DIAGNOSIS — Z992 Dependence on renal dialysis: Secondary | ICD-10-CM | POA: Diagnosis not present

## 2023-07-31 DIAGNOSIS — N185 Chronic kidney disease, stage 5: Secondary | ICD-10-CM | POA: Diagnosis not present

## 2023-07-31 DIAGNOSIS — S82101A Unspecified fracture of upper end of right tibia, initial encounter for closed fracture: Secondary | ICD-10-CM | POA: Diagnosis not present

## 2023-07-31 DIAGNOSIS — M25561 Pain in right knee: Secondary | ICD-10-CM | POA: Diagnosis not present

## 2023-07-31 DIAGNOSIS — Z4902 Encounter for fitting and adjustment of peritoneal dialysis catheter: Secondary | ICD-10-CM | POA: Diagnosis not present

## 2023-07-31 DIAGNOSIS — S82101K Unspecified fracture of upper end of right tibia, subsequent encounter for closed fracture with nonunion: Secondary | ICD-10-CM | POA: Diagnosis not present

## 2023-07-31 DIAGNOSIS — Z8701 Personal history of pneumonia (recurrent): Secondary | ICD-10-CM | POA: Diagnosis not present

## 2023-07-31 DIAGNOSIS — I120 Hypertensive chronic kidney disease with stage 5 chronic kidney disease or end stage renal disease: Secondary | ICD-10-CM | POA: Diagnosis not present

## 2023-08-01 DIAGNOSIS — N2581 Secondary hyperparathyroidism of renal origin: Secondary | ICD-10-CM | POA: Diagnosis not present

## 2023-08-01 DIAGNOSIS — N186 End stage renal disease: Secondary | ICD-10-CM | POA: Diagnosis not present

## 2023-08-01 DIAGNOSIS — Z992 Dependence on renal dialysis: Secondary | ICD-10-CM | POA: Diagnosis not present

## 2023-08-03 DIAGNOSIS — N186 End stage renal disease: Secondary | ICD-10-CM | POA: Diagnosis not present

## 2023-08-03 DIAGNOSIS — N2581 Secondary hyperparathyroidism of renal origin: Secondary | ICD-10-CM | POA: Diagnosis not present

## 2023-08-03 DIAGNOSIS — Z992 Dependence on renal dialysis: Secondary | ICD-10-CM | POA: Diagnosis not present

## 2023-08-05 DIAGNOSIS — N2581 Secondary hyperparathyroidism of renal origin: Secondary | ICD-10-CM | POA: Diagnosis not present

## 2023-08-05 DIAGNOSIS — N186 End stage renal disease: Secondary | ICD-10-CM | POA: Diagnosis not present

## 2023-08-05 DIAGNOSIS — Z992 Dependence on renal dialysis: Secondary | ICD-10-CM | POA: Diagnosis not present

## 2023-08-07 DIAGNOSIS — E213 Hyperparathyroidism, unspecified: Secondary | ICD-10-CM | POA: Diagnosis not present

## 2023-08-07 DIAGNOSIS — R1319 Other dysphagia: Secondary | ICD-10-CM | POA: Diagnosis not present

## 2023-08-07 DIAGNOSIS — N185 Chronic kidney disease, stage 5: Secondary | ICD-10-CM | POA: Diagnosis not present

## 2023-08-07 DIAGNOSIS — R053 Chronic cough: Secondary | ICD-10-CM | POA: Diagnosis not present

## 2023-08-08 DIAGNOSIS — Z992 Dependence on renal dialysis: Secondary | ICD-10-CM | POA: Diagnosis not present

## 2023-08-08 DIAGNOSIS — N186 End stage renal disease: Secondary | ICD-10-CM | POA: Diagnosis not present

## 2023-08-08 DIAGNOSIS — N2581 Secondary hyperparathyroidism of renal origin: Secondary | ICD-10-CM | POA: Diagnosis not present

## 2023-08-09 ENCOUNTER — Encounter (HOSPITAL_COMMUNITY): Payer: Self-pay

## 2023-08-10 DIAGNOSIS — N2581 Secondary hyperparathyroidism of renal origin: Secondary | ICD-10-CM | POA: Diagnosis not present

## 2023-08-10 DIAGNOSIS — N186 End stage renal disease: Secondary | ICD-10-CM | POA: Diagnosis not present

## 2023-08-10 DIAGNOSIS — Z992 Dependence on renal dialysis: Secondary | ICD-10-CM | POA: Diagnosis not present

## 2023-08-11 NOTE — Pre-Procedure Instructions (Signed)
 Surgical Instructions   Your procedure is scheduled on August 18, 2023. Report to [facility] Main Entrance A at 12:00 P.M., then check in with the Admitting office. Any questions or running late day of surgery: call [phone]  Questions prior to your surgery date: call [phone], Monday-Friday, 8am-4pm. If you experience any cold or flu symptoms such as cough, fever, chills, shortness of breath, etc. between now and your scheduled surgery, please notify us  at the above number.     Remember:  Do not eat after midnight the night before your surgery   You may drink clear liquids until 11:00 AM the morning of your surgery.   Clear liquids allowed are: Water , Non-Citrus Juices (without pulp), Carbonated Beverages, Clear Tea (no milk, honey, etc.), Black Coffee Only (NO MILK, CREAM OR POWDERED CREAMER of any kind), and Gatorade.    Take these medicines the morning of surgery with A SIP OF WATER : allopurinol  (ZYLOPRIM )  budesonide -formoterol  (SYMBICORT) inhaler  labetalol  (NORMODYNE )    May take these medicines IF NEEDED: acetaminophen  (TYLENOL )  albuterol  (PROVENTIL  HFA;VENTOLIN  HFA) inhaler - please bring inhaler with you morning of surgery methocarbamol  (ROBAXIN )  ondansetron  (ZOFRAN )  pantoprazole (PROTONIX)    Please follow your prescriber's instructions on if/when to stop taking hydroxychloroquine  (PLAQUENIL ).   One week prior to surgery, STOP taking any Aspirin  (unless otherwise instructed by your surgeon) Aleve, Naproxen, Ibuprofen, Motrin, Advil, Goody's, BC's, all herbal medications, fish oil, and non-prescription vitamins.                     Do NOT Smoke (Tobacco/Vaping) for 24 hours prior to your procedure.  If you use a CPAP at night, you may bring your mask/headgear for your overnight stay.   You will be asked to remove any contacts, glasses, piercing's, hearing aid's, dentures/partials prior to surgery. Please bring cases for these items if needed.     Patients discharged the day of surgery will not be allowed to drive home, and someone needs to stay with them for 24 hours.  SURGICAL WAITING ROOM VISITATION Patients may have no more than 2 support people in the waiting area - these visitors may rotate.   Pre-op nurse will coordinate an appropriate time for 1 ADULT support person, who may not rotate, to accompany patient in pre-op.  Children under the age of 65 must have an adult with them who is not the patient and must remain in the main waiting area with an adult.  If the patient needs to stay at the hospital during part of their recovery, the visitor guidelines for inpatient rooms apply.  Please refer to the [facility] website for the visitor guidelines for any additional information.   If you received a COVID test during your pre-op visit  it is requested that you wear a mask when out in public, stay away from anyone that may not be feeling well and notify your surgeon if you develop symptoms. If you have been in contact with anyone that has tested positive in the last 10 days please notify you surgeon.      Pre-operative CHG Bathing Instructions   You can play a key role in reducing the risk of infection after surgery. Your skin needs to be as free of germs as possible. You can reduce the number of germs on your skin by washing with CHG (chlorhexidine  gluconate) soap before surgery. CHG is an antiseptic soap that kills germs and continues to kill germs even after washing.  DO NOT use if you have an allergy to chlorhexidine /CHG or antibacterial soaps. If your skin becomes reddened or irritated, stop using the CHG and notify one of our RNs at [phone].              TAKE A SHOWER THE NIGHT BEFORE SURGERY AND THE DAY OF SURGERY    Please keep in mind the following:  DO NOT shave, including legs and underarms, 48 hours prior to surgery.   You may shave your face before/day of surgery.  Place clean sheets on your bed the night  before surgery Use a clean washcloth (not used since being washed) for each shower. DO NOT sleep with pet's night before surgery.  CHG Shower Instructions:  Wash your face and private area with normal soap. If you choose to wash your hair, wash first with your normal shampoo.  After you use shampoo/soap, rinse your hair and body thoroughly to remove shampoo/soap residue.  Turn the water  OFF and apply half the bottle of CHG soap to a CLEAN washcloth.  Apply CHG soap ONLY FROM YOUR NECK DOWN TO YOUR TOES (washing for 3-5 minutes)  DO NOT use CHG soap on face, private areas, open wounds, or sores.  Pay special attention to the area where your surgery is being performed.  If you are having back surgery, having someone wash your back for you may be helpful. Wait 2 minutes after CHG soap is applied, then you may rinse off the CHG soap.  Pat dry with a clean towel  Put on clean pajamas    Additional instructions for the day of surgery: DO NOT APPLY any lotions, deodorants, cologne, or perfumes.   Do not wear jewelry or makeup Do not wear nail polish, gel polish, artificial nails, or any other type of covering on natural nails (fingers and toes) Do not bring valuables to the hospital. [facility] is not responsible for valuables/personal belongings. Put on clean/comfortable clothes.  Please brush your teeth.  Ask your nurse before applying any prescription medications to the skin.

## 2023-08-12 DIAGNOSIS — N186 End stage renal disease: Secondary | ICD-10-CM | POA: Diagnosis not present

## 2023-08-12 DIAGNOSIS — N2581 Secondary hyperparathyroidism of renal origin: Secondary | ICD-10-CM | POA: Diagnosis not present

## 2023-08-12 DIAGNOSIS — Z992 Dependence on renal dialysis: Secondary | ICD-10-CM | POA: Diagnosis not present

## 2023-08-14 ENCOUNTER — Other Ambulatory Visit: Payer: Self-pay

## 2023-08-14 ENCOUNTER — Encounter (HOSPITAL_COMMUNITY): Payer: Self-pay

## 2023-08-14 ENCOUNTER — Encounter (HOSPITAL_COMMUNITY)
Admission: RE | Admit: 2023-08-14 | Discharge: 2023-08-14 | Disposition: A | Discharge status: Home / Self Care | Payer: Self-pay | Source: Ambulatory Visit | Attending: Surgery | Admitting: Surgery

## 2023-08-14 VITALS — BP 136/96 | HR 80 | Temp 98.5°F | Resp 16 | Ht 62.0 in | Wt 155.7 lb

## 2023-08-14 DIAGNOSIS — Z01812 Encounter for preprocedural laboratory examination: Secondary | ICD-10-CM | POA: Insufficient documentation

## 2023-08-14 DIAGNOSIS — N289 Disorder of kidney and ureter, unspecified: Secondary | ICD-10-CM | POA: Diagnosis not present

## 2023-08-14 HISTORY — DX: End stage renal disease: N18.6

## 2023-08-14 HISTORY — DX: End stage renal disease: Z99.2

## 2023-08-14 LAB — CBC
HCT: 35.2 % — ABNORMAL LOW (ref 36.0–46.0)
Hemoglobin: 11.6 g/dL — ABNORMAL LOW (ref 12.0–15.0)
MCH: 30.3 pg (ref 26.0–34.0)
MCHC: 33 g/dL (ref 30.0–36.0)
MCV: 91.9 fL (ref 80.0–100.0)
Platelets: 190 K/uL (ref 150–400)
RBC: 3.83 MIL/uL — ABNORMAL LOW (ref 3.87–5.11)
RDW: 14.6 % (ref 11.5–15.5)
WBC: 5.4 K/uL (ref 4.0–10.5)
nRBC: 0 % (ref 0.0–0.2)

## 2023-08-14 LAB — BASIC METABOLIC PANEL WITH GFR
Anion gap: 12 (ref 5–15)
BUN: 29 mg/dL — ABNORMAL HIGH (ref 8–23)
CO2: 28 mmol/L (ref 22–32)
Calcium: 12.7 mg/dL — ABNORMAL HIGH (ref 8.9–10.3)
Chloride: 94 mmol/L — ABNORMAL LOW (ref 98–111)
Creatinine, Ser: 6.53 mg/dL — ABNORMAL HIGH (ref 0.44–1.00)
GFR, Estimated: 6 mL/min — ABNORMAL LOW (ref >60)
Glucose, Bld: 97 mg/dL (ref 70–99)
Potassium: 3.2 mmol/L — ABNORMAL LOW (ref 3.5–5.1)
Sodium: 134 mmol/L — ABNORMAL LOW (ref 135–145)

## 2023-08-14 NOTE — Progress Notes (Signed)
 PCP - Dr. Alberta Sharps Cardiologist - Only see's for transplant evaluation Endocrinologist - Dr. Sheree Bong Garnett Schooner - last office visit 03/08/2023 Rheumatologist - Dr. Lavanda Ezzard [place] Nephrologist - Dr. Jerrye  PPM/ICD - Denies Device Orders - n/a Rep Notified - n/a  Chest x-ray - n/a EKG - 01/28/2023 Stress Test - 08/01/2022 (CE) ECHO - 02/08/2023 (CE) Cardiac Cath - Pt states she had one at [facility] 10+ years ago.  Sleep Study - +OSA, but pt unable to tolerate CPAP  No DM  Last dose of GLP1 agonist- n/a GLP1 instructions: n/a  Blood Thinner Instructions: n/a Aspirin  Instructions: n/a  ERAS Protcol - Clear liquids until 1100 morning of surgery PRE-SURGERY Ensure or G2- n/a  COVID TEST- n/a   Anesthesia review: Yes. Hx of HTN, Asthma, OSA, ESRD with HD T,TH,S. Pt is aware to make sure to have HD session on Thursday prior to surgery on Friday.  Patient denies shortness of breath, fever, cough and chest pain at PAT appointment. Pt denies any respiratory illness/infection in the last two months.   All instructions explained to the patient, with a verbal understanding of the material. Patient agrees to go over the instructions while at home for a better understanding. Patient also instructed to self quarantine after being tested for COVID-19. The opportunity to ask questions was provided.

## 2023-08-15 ENCOUNTER — Encounter (HOSPITAL_COMMUNITY): Payer: Self-pay

## 2023-08-15 ENCOUNTER — Emergency Department (HOSPITAL_COMMUNITY)

## 2023-08-15 ENCOUNTER — Other Ambulatory Visit: Payer: Self-pay

## 2023-08-15 ENCOUNTER — Emergency Department (HOSPITAL_COMMUNITY)
Admission: EM | Admit: 2023-08-15 | Discharge: 2023-08-16 | Disposition: A | Discharge status: Home / Self Care | Attending: Emergency Medicine | Admitting: Emergency Medicine

## 2023-08-15 DIAGNOSIS — Z992 Dependence on renal dialysis: Secondary | ICD-10-CM | POA: Insufficient documentation

## 2023-08-15 DIAGNOSIS — R052 Subacute cough: Secondary | ICD-10-CM | POA: Diagnosis not present

## 2023-08-15 DIAGNOSIS — Z7951 Long term (current) use of inhaled steroids: Secondary | ICD-10-CM | POA: Diagnosis not present

## 2023-08-15 DIAGNOSIS — D259 Leiomyoma of uterus, unspecified: Secondary | ICD-10-CM | POA: Diagnosis not present

## 2023-08-15 DIAGNOSIS — R059 Cough, unspecified: Secondary | ICD-10-CM | POA: Diagnosis not present

## 2023-08-15 DIAGNOSIS — Z91040 Latex allergy status: Secondary | ICD-10-CM | POA: Insufficient documentation

## 2023-08-15 DIAGNOSIS — N186 End stage renal disease: Secondary | ICD-10-CM | POA: Insufficient documentation

## 2023-08-15 DIAGNOSIS — J45909 Unspecified asthma, uncomplicated: Secondary | ICD-10-CM | POA: Diagnosis not present

## 2023-08-15 DIAGNOSIS — Z452 Encounter for adjustment and management of vascular access device: Secondary | ICD-10-CM | POA: Diagnosis not present

## 2023-08-15 DIAGNOSIS — Z79899 Other long term (current) drug therapy: Secondary | ICD-10-CM | POA: Diagnosis not present

## 2023-08-15 DIAGNOSIS — K5790 Diverticulosis of intestine, part unspecified, without perforation or abscess without bleeding: Secondary | ICD-10-CM | POA: Diagnosis not present

## 2023-08-15 DIAGNOSIS — M25551 Pain in right hip: Secondary | ICD-10-CM | POA: Insufficient documentation

## 2023-08-15 DIAGNOSIS — M8580 Other specified disorders of bone density and structure, unspecified site: Secondary | ICD-10-CM | POA: Diagnosis not present

## 2023-08-15 DIAGNOSIS — J9811 Atelectasis: Secondary | ICD-10-CM | POA: Diagnosis not present

## 2023-08-15 DIAGNOSIS — R935 Abnormal findings on diagnostic imaging of other abdominal regions, including retroperitoneum: Secondary | ICD-10-CM | POA: Diagnosis not present

## 2023-08-15 DIAGNOSIS — I120 Hypertensive chronic kidney disease with stage 5 chronic kidney disease or end stage renal disease: Secondary | ICD-10-CM | POA: Diagnosis not present

## 2023-08-15 DIAGNOSIS — Z96642 Presence of left artificial hip joint: Secondary | ICD-10-CM | POA: Diagnosis not present

## 2023-08-15 DIAGNOSIS — K449 Diaphragmatic hernia without obstruction or gangrene: Secondary | ICD-10-CM | POA: Diagnosis not present

## 2023-08-15 DIAGNOSIS — N2581 Secondary hyperparathyroidism of renal origin: Secondary | ICD-10-CM | POA: Diagnosis not present

## 2023-08-15 LAB — RESP PANEL BY RT-PCR (RSV, FLU A&B, COVID)  RVPGX2
Influenza A by PCR: NEGATIVE
Influenza B by PCR: NEGATIVE
Resp Syncytial Virus by PCR: NEGATIVE
SARS Coronavirus 2 by RT PCR: NEGATIVE

## 2023-08-15 LAB — CBG MONITORING, ED: Glucose-Capillary: 85 mg/dL (ref 70–99)

## 2023-08-15 NOTE — ED Triage Notes (Signed)
 Pt reports R hip pain radiating down leg x2 weeks. Pt reports pain is excruciating with movement. No injury reported. Pt sent from ortho UC for possible hernia. No imaging done.  Pt also reports cough, sneezing x quite a while. No sick contacts.

## 2023-08-15 NOTE — ED Provider Notes (Signed)
 [place] EMERGENCY DEPARTMENT AT [facility] Provider Note   CSN: [id number] Arrival date & time: 08/15/23  8082     Patient presents with: Hip Pain and Cough   Colleen Obrien is a 68 y.o. female who presents with complaints of anterior right thigh pain.  No injury or trauma.  X 2 weeks.  Not associate with any numbness or tingling.  Pain is worse with coughing and ambulating.  No urinary symptoms.  She was referred here to rule out a hernia.  Has a history of C-sections otherwise no abdominal surgeries.  Notes that she has been coughing and sneezing for the past month.    Hip Pain  Cough  Past Medical History:  Diagnosis Date   Anemia in chronic kidney disease (CKD)    Arthritis    Asthma    Blood dyscrasia    systemic lupus   Chronic renal disease, stage 5, glomerular filtration rate less than or equal to 15 mL/min/1.73 square meter (HCC)    Dyspnea    End stage renal disease on dialysis (HCC)    T, TH, S   GERD (gastroesophageal reflux disease)    Gout    Hypertension    Lupus    Narcolepsy    Osteoporosis 05/05/2022   Pneumonia    x 1   Sleep apnea    tries to use CPAP nightly as of 04/29/22   Walker as Engineer, water dependent    when going outside home       Prior to Admission medications   Medication Sig Start Date End Date Taking? Authorizing Provider  acetaminophen  (TYLENOL ) 500 MG tablet Take 500 mg by mouth every 6 (six) hours as needed (for pain.).    [provider]  albuterol  (PROVENTIL  HFA;VENTOLIN  HFA) 108 (90 BASE) MCG/ACT inhaler Inhale 2 puffs into the lungs every 6 (six) hours as needed for wheezing or shortness of breath.    [provider]  allopurinol  (ZYLOPRIM ) 300 MG tablet Take 300 mg by mouth in the morning.    [provider]  AURYXIA 1 GM 210 MG(Fe) tablet Take 2 tablets by mouth 3 (three) times daily with meals. 07/31/23 07/26/24  [provider]  B  Complex-C-Zn-Folic Acid (DIALYVITE/ZINC) TABS Take 1 tablet by mouth in the morning.    [provider]  budesonide -formoterol  (SYMBICORT) 80-4.5 MCG/ACT inhaler Inhale 2 puffs into the lungs in the morning and at bedtime.    [provider]  Cyanocobalamin (VITAMIN B-12 PO) Take 1 tablet by mouth daily.    [provider]  furosemide  (LASIX ) 80 MG tablet Take 80 mg by mouth in the morning.    [provider]  hydroxychloroquine  (PLAQUENIL ) 200 MG tablet Take 200 mg by mouth in the morning.    [provider]  labetalol  (NORMODYNE ) 200 MG tablet Take 200 mg by mouth 2 (two) times daily. 04/11/22   [provider]  MAGNESIUM  PO Take 1 capsule by mouth daily.    [provider]  methocarbamol  (ROBAXIN ) 500 MG tablet Take 0.5 tablets (250 mg total) by mouth every 8 (eight) hours as needed for muscle spasms. 09/22/22   Tobie Yetta HERO, MD  ondansetron  (ZOFRAN ) 4 MG tablet Take 4 mg by mouth every 8 (eight) hours as needed for nausea or vomiting. 04/21/23   [provider]  pantoprazole (PROTONIX) 40 MG tablet Take 40 mg by mouth daily as needed (indigestion/acid reflux.). 01/06/23   [provider]  sucroferric oxyhydroxide (VELPHORO) 500 MG chewable tablet Chew 1,000 mg by mouth 3 (three) times daily with meals.    [provider]  triamcinolone  cream (KENALOG) 0.1 % Apply 1 Application topically 2 (two) times daily as needed (skin irritation.).    [provider]    Allergies: Erythromycin, Levofloxacin, Sulfa antibiotics, and Latex    Review of Systems  Respiratory:  Positive for cough.     Updated Vital Signs BP (!) 160/96   Pulse 74   Temp 98.4 F (36.9 C) (Oral)   Resp 17   Ht 5' 2 (1.575 m)   Wt 70.6 kg   SpO2 95%   BMI 28.47 kg/m   Physical Exam Vitals and nursing note reviewed.  Constitutional:      General: She is not in acute distress.    Appearance: She is well-developed.   HENT:     Head: Normocephalic and atraumatic.  Eyes:     Conjunctiva/sclera: Conjunctivae normal.  Cardiovascular:     Rate and Rhythm: Normal rate and regular rhythm.     Heart sounds: No murmur heard. Pulmonary:     Effort: Pulmonary effort is normal. No respiratory distress.     Breath sounds: Normal breath sounds.  Abdominal:     Palpations: Abdomen is soft.     Tenderness: There is no abdominal tenderness.     Comments: No hernia appreciated, abdomen nontender  Musculoskeletal:        General: No swelling.     Cervical back: Neck supple.     Comments: Tender over anterior lateral hip  Skin:    General: Skin is warm and dry.     Capillary Refill: Capillary refill takes less than 2 seconds.  Neurological:     Mental Status: She is alert.  Psychiatric:        Mood and Affect: Mood normal.     (all labs ordered are listed, but only abnormal results are displayed) Labs Reviewed  RESP PANEL BY RT-PCR (RSV, FLU A&B, COVID)  [id number]  CBG MONITORING, ED    EKG: None  Radiology: CT ABDOMEN PELVIS WO CONTRAST Result Date: 08/15/2023 CLINICAL DATA:  Right-sided hip pain for 2 weeks EXAM: CT ABDOMEN AND PELVIS WITHOUT CONTRAST TECHNIQUE: Multidetector CT imaging of the abdomen and pelvis was performed following the standard protocol without IV contrast. RADIATION DOSE REDUCTION: This exam was performed according to the departmental dose-optimization program which includes automated exposure control, adjustment of the mA and/or kV according to patient size and/or use of iterative reconstruction technique. COMPARISON:  11/10/2014 FINDINGS: Lower chest: Bibasilar scarring is noted. No focal confluent infiltrate is seen. Hepatobiliary: No focal liver abnormality is seen. No gallstones, gallbladder wall thickening, or biliary dilatation. Pancreas: Unremarkable. No pancreatic ductal dilatation or surrounding inflammatory changes. Spleen: Multiple calcified splenic granulomas are seen.  Adrenals/Urinary Tract: Adrenal glands are within normal limits. Kidneys demonstrate scattered small cysts. No follow-up is recommended. No renal calculi or obstructive changes are seen. The bladder is decompressed. Stomach/Bowel: Scattered diverticular changes noted without evidence of diverticulitis. The appendix is within normal limits. Small bowel is within normal limits. Large sliding-type hiatal hernia is noted. Vascular/Lymphatic: Aortic atherosclerosis. No enlarged abdominal or pelvic lymph nodes. Reproductive: Uterus shows multiple calcified fibroids. These are stable from the prior exam. No adnexal mass is noted. Other: No abdominal wall hernia or abnormality. No abdominopelvic ascites. Musculoskeletal: Left hip replacement is noted. Degenerative changes of lumbar spine are seen. Chronic T11 compression fracture. IMPRESSION:  Diverticulosis without diverticulitis. Large sliding-type hiatal hernia. Chronic uterine fibroids. No other focal abnormality is noted. Electronically Signed   By: Oneil Devonshire M.D.   On: 08/15/2023 23:23   DG Chest 2 View Result Date: 08/15/2023 CLINICAL DATA:  Cough EXAM: CHEST - 2 VIEW COMPARISON:  Chest x-ray 01/28/2023 FINDINGS: Moderate to large hiatal hernia present. Cardiomediastinal silhouette is within normal limits. There are atelectatic changes in the right middle lobe. There is no pleural effusion or pneumothorax. Left chest port catheter tip ends in the SVC. There are degenerative changes of the right shoulder. IMPRESSION: 1. Moderate to large hiatal hernia. 2. Atelectatic changes in the right middle lobe. Electronically Signed   By: Greig Pique M.D.   On: 08/15/2023 23:05   DG Hip Unilat  With Pelvis 2-3 Views Right Result Date: 08/15/2023 CLINICAL DATA:  Pain EXAM: DG HIP (WITH OR WITHOUT PELVIS) 2-3V RIGHT COMPARISON:  None. FINDINGS: Left hip arthroplasty appears in anatomic alignment. The bones are osteopenic. There is no acute fracture or dislocation of the  right hip. Calcifications in the pelvis are unchanged coarse calcifications in the pelvis may be related to fibroids measuring up to 4 cm. IMPRESSION: 1. No acute fracture or dislocation of the right hip. 2. Left hip arthroplasty appears in anatomic alignment. Electronically Signed   By: Greig Pique M.D.   On: 08/15/2023 20:44     Procedures   Medications Ordered in the ED - No data to display                                  Medical Decision Making Amount and/or Complexity of Data Reviewed Labs: ordered. Radiology: ordered.   This patient presents to the ED with chief complaint(s) of hip pain.  The complaint involves an extensive differential diagnosis and also carries with it a high risk of complications and morbidity.   Pertinent past medical history as listed in HPI  The differential diagnosis includes  Inguinal hernia, arthritis, muscle strain, bursitis, URI, pneumonia Additional history obtained: Records reviewed Care Everywhere/External Records  Assessment and management:   Patient presents hypertensive with complaints of right hip pain and anterior thigh pain.  She has been experiencing the symptoms over the past 2 weeks.  Is worse with coughing and ambulating.  On exam she has no significant findings.  No evidence of hernia strangulation.  CT is without any acute findings.  No evidence of hernia.  Overall her exam is most consistent with musculoskeletal etiology.  Reports that she has been having a cough off and on with sneezing over the past couple months as well.  Chest x-ray and lung sounds clear.  Her exam is most consistent with allergic rhinitis.  She intends to have an appointment with PCP and orthopedics.  Independent ECG interpretation:  none  Independent labs interpretation:  The following labs were independently interpreted:  Respiratory panel negative  Independent visualization and interpretation of imaging: I independently visualized the following imaging  with scope of interpretation limited to determining acute life threatening conditions related to emergency care:  CT abdomen pelvis diverticulosis without diverticulitis, large sliding-type hiatal hernia, chronic uterine fibroids, no other acute abnormality Chest x-ray large hiatal hernia, no other acute abnormality Hip x-ray no acute fracture, left hip arthroplasty in anatomic alignment  Consultations obtained:   none  Disposition:   Patient will be discharged home. The patient has been appropriately medically screened and/or stabilized in  the ED. I have low suspicion for any other emergent medical condition which would require further screening, evaluation or treatment in the ED or require inpatient management. At time of discharge the patient is hemodynamically stable and in no acute distress. I have discussed work-up results and diagnosis with patient and answered all questions. Patient is agreeable with discharge plan. We discussed strict return precautions for returning to the emergency department and they verbalized understanding.     Social Determinants of Health:   none  This note was dictated with voice recognition software.  Despite best efforts at proofreading, errors may have occurred which can change the documentation meaning.       Final diagnoses:  Right hip pain  Subacute cough    ED Discharge Orders     None          Donnajean Lynwood DEL, PA-C 08/16/23 0003    Emil Share, DO 08/16/23 1459

## 2023-08-15 NOTE — Anesthesia Preprocedure Evaluation (Signed)
 Anesthesia Evaluation    Airway        Dental   Pulmonary           Cardiovascular hypertension,      Neuro/Psych    GI/Hepatic   Endo/Other    Renal/GU      Musculoskeletal   Abdominal   Peds  Hematology   Anesthesia Other Findings   Reproductive/Obstetrics                              Anesthesia Physical Anesthesia Plan  ASA:   Anesthesia Plan:    Post-op Pain Management:    Induction:   PONV Risk Score and Plan:   Airway Management Planned:   Additional Equipment:   Intra-op Plan:   Post-operative Plan:   Informed Consent:   Plan Discussed with:   Anesthesia Plan Comments: (TTE 02/08/23: CONCLUSION -------------------------------------------------------------------------------  NORMAL LEFT VENTRICULAR SYSTOLIC FUNCTION WITH MILD LVH  ESTIMATED EF: 55%, CALC EF(3D): 62%  NORMAL LA PRESSURES WITH NORMAL DIASTOLIC FUNCTION  NORMAL RIGHT VENTRICULAR SYSTOLIC FUNCTION  VALVULAR REGURGITATION: No AR, TRIVIAL MR, TRIVIAL PR, MODERATE TR  NO VALVULAR STENOSIS   WITH NO RESPIRATORY FLOW VARIATION  PHYSICIAN IMPRESSIONS --------------------------------------------------------------------  MILD TO MODERATE TR    Cardiac PET 08/01/22: Conclusions:   1. Myocardial perfusion is normal without evidence of ischemia or infarct.   2. Global  myocardial blood flow reserve is abnormal suggestive of microvascular dysfunction.   3. Normal left ventricular systolic function.  )         Anesthesia Quick Evaluation

## 2023-08-15 NOTE — Discharge Instructions (Addendum)
 You were evaluated in the emergency room for right hip pain and chronic cough.  Your lab work and imaging did not show any significant abnormality.  I would recommend following up with orthopedics and your primary care doctor.

## 2023-08-16 DIAGNOSIS — Z79899 Other long term (current) drug therapy: Secondary | ICD-10-CM | POA: Diagnosis not present

## 2023-08-16 DIAGNOSIS — H40003 Preglaucoma, unspecified, bilateral: Secondary | ICD-10-CM | POA: Diagnosis not present

## 2023-08-16 DIAGNOSIS — M25561 Pain in right knee: Secondary | ICD-10-CM | POA: Diagnosis not present

## 2023-08-16 NOTE — ED Notes (Signed)
Discharge instructions reviewed with patient. Patient questions answered and opportunity for education reviewed. Patient voices understanding of discharge instructions with no further questions. Patient to lobby via wheelchair. 

## 2023-08-17 DIAGNOSIS — N2581 Secondary hyperparathyroidism of renal origin: Secondary | ICD-10-CM | POA: Diagnosis not present

## 2023-08-17 DIAGNOSIS — N186 End stage renal disease: Secondary | ICD-10-CM | POA: Diagnosis not present

## 2023-08-17 DIAGNOSIS — Z992 Dependence on renal dialysis: Secondary | ICD-10-CM | POA: Diagnosis not present

## 2023-08-18 ENCOUNTER — Encounter (HOSPITAL_COMMUNITY): Payer: Self-pay | Admitting: Physician Assistant

## 2023-08-18 ENCOUNTER — Encounter (HOSPITAL_COMMUNITY)
Admission: RE | Disposition: A | Discharge status: Home / Self Care | Payer: Self-pay | Source: Home / Self Care | Attending: Surgery

## 2023-08-18 ENCOUNTER — Ambulatory Visit (HOSPITAL_COMMUNITY)
Admission: RE | Admit: 2023-08-18 | Discharge: 2023-08-19 | Disposition: A | Discharge status: Home / Self Care | Attending: Surgery | Admitting: Surgery

## 2023-08-18 ENCOUNTER — Encounter (HOSPITAL_COMMUNITY): Payer: Self-pay | Admitting: Surgery

## 2023-08-18 ENCOUNTER — Ambulatory Visit (HOSPITAL_COMMUNITY): Admitting: Anesthesiology

## 2023-08-18 ENCOUNTER — Other Ambulatory Visit: Payer: Self-pay

## 2023-08-18 DIAGNOSIS — E041 Nontoxic single thyroid nodule: Secondary | ICD-10-CM | POA: Diagnosis not present

## 2023-08-18 DIAGNOSIS — J45909 Unspecified asthma, uncomplicated: Secondary | ICD-10-CM | POA: Insufficient documentation

## 2023-08-18 DIAGNOSIS — E876 Hypokalemia: Secondary | ICD-10-CM

## 2023-08-18 DIAGNOSIS — E213 Hyperparathyroidism, unspecified: Secondary | ICD-10-CM | POA: Diagnosis not present

## 2023-08-18 DIAGNOSIS — N2581 Secondary hyperparathyroidism of renal origin: Secondary | ICD-10-CM

## 2023-08-18 DIAGNOSIS — N186 End stage renal disease: Secondary | ICD-10-CM

## 2023-08-18 DIAGNOSIS — M3214 Glomerular disease in systemic lupus erythematosus: Secondary | ICD-10-CM | POA: Diagnosis not present

## 2023-08-18 DIAGNOSIS — I120 Hypertensive chronic kidney disease with stage 5 chronic kidney disease or end stage renal disease: Secondary | ICD-10-CM | POA: Diagnosis not present

## 2023-08-18 DIAGNOSIS — G4733 Obstructive sleep apnea (adult) (pediatric): Secondary | ICD-10-CM | POA: Diagnosis not present

## 2023-08-18 DIAGNOSIS — Z992 Dependence on renal dialysis: Secondary | ICD-10-CM | POA: Insufficient documentation

## 2023-08-18 DIAGNOSIS — E210 Primary hyperparathyroidism: Secondary | ICD-10-CM | POA: Diagnosis present

## 2023-08-18 DIAGNOSIS — I10 Essential (primary) hypertension: Secondary | ICD-10-CM | POA: Diagnosis not present

## 2023-08-18 DIAGNOSIS — N289 Disorder of kidney and ureter, unspecified: Secondary | ICD-10-CM

## 2023-08-18 DIAGNOSIS — D631 Anemia in chronic kidney disease: Secondary | ICD-10-CM

## 2023-08-18 HISTORY — PX: PARATHYROIDECTOMY: SHX19

## 2023-08-18 LAB — POCT I-STAT, CHEM 8
BUN: 22 mg/dL (ref 8–23)
BUN: 30 mg/dL — ABNORMAL HIGH (ref 8–23)
Calcium, Ion: 1.44 mmol/L — ABNORMAL HIGH (ref 1.15–1.40)
Calcium, Ion: 1.53 mmol/L (ref 1.15–1.40)
Chloride: 98 mmol/L (ref 98–111)
Chloride: 99 mmol/L (ref 98–111)
Creatinine, Ser: 5.5 mg/dL — ABNORMAL HIGH (ref 0.44–1.00)
Creatinine, Ser: 5.6 mg/dL — ABNORMAL HIGH (ref 0.44–1.00)
Glucose, Bld: 84 mg/dL (ref 70–99)
Glucose, Bld: 85 mg/dL (ref 70–99)
HCT: 35 % — ABNORMAL LOW (ref 36.0–46.0)
HCT: 36 % (ref 36.0–46.0)
Hemoglobin: 11.9 g/dL — ABNORMAL LOW (ref 12.0–15.0)
Hemoglobin: 12.2 g/dL (ref 12.0–15.0)
Potassium: 2.9 mmol/L — ABNORMAL LOW (ref 3.5–5.1)
Potassium: 6.6 mmol/L (ref 3.5–5.1)
Sodium: 134 mmol/L — ABNORMAL LOW (ref 135–145)
Sodium: 136 mmol/L (ref 135–145)
TCO2: 25 mmol/L (ref 22–32)
TCO2: 28 mmol/L (ref 22–32)

## 2023-08-18 LAB — CALCIUM: Calcium: 11.6 mg/dL — ABNORMAL HIGH (ref 8.9–10.3)

## 2023-08-18 LAB — HEPATITIS B SURFACE ANTIGEN: Hepatitis B Surface Ag: NONREACTIVE

## 2023-08-18 SURGERY — PARATHYROIDECTOMY
Anesthesia: General

## 2023-08-18 MED ORDER — LIDOCAINE 2% (20 MG/ML) 5 ML SYRINGE
INTRAMUSCULAR | Status: DC | PRN
  Administered 2023-08-18: 100 mg via INTRAVENOUS

## 2023-08-18 MED ORDER — HYDROXYCHLOROQUINE SULFATE 200 MG PO TABS
200.0000 mg | ORAL_TABLET | Freq: Every morning | ORAL | Status: DC
  Administered 2023-08-19: 200 mg via ORAL
  Filled 2023-08-18: qty 1

## 2023-08-18 MED ORDER — BUPIVACAINE HCL (PF) 0.25 % IJ SOLN
INTRAMUSCULAR | Status: AC
  Filled 2023-08-18: qty 30

## 2023-08-18 MED ORDER — CEFAZOLIN SODIUM-DEXTROSE 2-4 GM/100ML-% IV SOLN
2.0000 g | INTRAVENOUS | Status: AC
  Administered 2023-08-18: 2 g via INTRAVENOUS
  Filled 2023-08-18: qty 100

## 2023-08-18 MED ORDER — ONDANSETRON HCL 4 MG/2ML IJ SOLN
INTRAMUSCULAR | Status: DC | PRN
  Administered 2023-08-18: 4 mg via INTRAVENOUS

## 2023-08-18 MED ORDER — HYDROMORPHONE HCL 1 MG/ML IJ SOLN: 1.0000 mg | INTRAMUSCULAR | Status: DC | PRN

## 2023-08-18 MED ORDER — TRAMADOL HCL 50 MG PO TABS: 50.0000 mg | ORAL_TABLET | Freq: Four times a day (QID) | ORAL | 0 refills | Status: DC | PRN

## 2023-08-18 MED ORDER — ORAL CARE MOUTH RINSE: 15.0000 mL | Freq: Once | OROMUCOSAL | Status: AC

## 2023-08-18 MED ORDER — OXYCODONE HCL 5 MG PO TABS: 5.0000 mg | ORAL_TABLET | ORAL | Status: DC | PRN

## 2023-08-18 MED ORDER — CHLORHEXIDINE GLUCONATE CLOTH 2 % EX PADS: 6.0000 | MEDICATED_PAD | Freq: Once | CUTANEOUS | Status: DC

## 2023-08-18 MED ORDER — MAGNESIUM OXIDE -MG SUPPLEMENT 400 (240 MG) MG PO TABS
200.0000 mg | ORAL_TABLET | Freq: Every day | ORAL | Status: DC
  Administered 2023-08-18 – 2023-08-19 (×2): 200 mg via ORAL
  Filled 2023-08-18 (×2): qty 1

## 2023-08-18 MED ORDER — ACETAMINOPHEN 650 MG RE SUPP: 650.0000 mg | Freq: Four times a day (QID) | RECTAL | Status: DC | PRN

## 2023-08-18 MED ORDER — ONDANSETRON 4 MG PO TBDP: 4.0000 mg | ORAL_TABLET | Freq: Four times a day (QID) | ORAL | Status: DC | PRN

## 2023-08-18 MED ORDER — 0.9 % SODIUM CHLORIDE (POUR BTL) OPTIME
TOPICAL | Status: DC | PRN
  Administered 2023-08-18: 1000 mL

## 2023-08-18 MED ORDER — POTASSIUM CHLORIDE CRYS ER 20 MEQ PO TBCR: 40.0000 meq | EXTENDED_RELEASE_TABLET | Freq: Once | ORAL | Status: DC

## 2023-08-18 MED ORDER — ALBUTEROL SULFATE (2.5 MG/3ML) 0.083% IN NEBU: 2.5000 mg | INHALATION_SOLUTION | Freq: Four times a day (QID) | RESPIRATORY_TRACT | Status: DC | PRN

## 2023-08-18 MED ORDER — ONDANSETRON HCL 4 MG/2ML IJ SOLN: 4.0000 mg | Freq: Four times a day (QID) | INTRAMUSCULAR | Status: DC | PRN

## 2023-08-18 MED ORDER — ACETAMINOPHEN 325 MG PO TABS: 650.0000 mg | ORAL_TABLET | Freq: Four times a day (QID) | ORAL | Status: DC | PRN

## 2023-08-18 MED ORDER — PANTOPRAZOLE SODIUM 40 MG PO TBEC: 40.0000 mg | DELAYED_RELEASE_TABLET | Freq: Every day | ORAL | Status: DC | PRN

## 2023-08-18 MED ORDER — FENTANYL CITRATE (PF) 100 MCG/2ML IJ SOLN
INTRAMUSCULAR | Status: AC
  Filled 2023-08-18: qty 2

## 2023-08-18 MED ORDER — FENTANYL CITRATE (PF) 250 MCG/5ML IJ SOLN
INTRAMUSCULAR | Status: DC | PRN
  Administered 2023-08-18: 50 ug via INTRAVENOUS
  Administered 2023-08-18: 100 ug via INTRAVENOUS
  Administered 2023-08-18 (×2): 50 ug via INTRAVENOUS

## 2023-08-18 MED ORDER — ALLOPURINOL 300 MG PO TABS
300.0000 mg | ORAL_TABLET | Freq: Every morning | ORAL | Status: DC
  Administered 2023-08-19: 300 mg via ORAL
  Filled 2023-08-18: qty 1

## 2023-08-18 MED ORDER — ROCURONIUM BROMIDE 10 MG/ML (PF) SYRINGE
PREFILLED_SYRINGE | INTRAVENOUS | Status: DC | PRN
  Administered 2023-08-18: 40 mg via INTRAVENOUS

## 2023-08-18 MED ORDER — CHLORHEXIDINE GLUCONATE 0.12 % MT SOLN
15.0000 mL | Freq: Once | OROMUCOSAL | Status: AC
  Administered 2023-08-18: 15 mL via OROMUCOSAL
  Filled 2023-08-18: qty 15

## 2023-08-18 MED ORDER — MIDAZOLAM HCL 2 MG/2ML IJ SOLN
INTRAMUSCULAR | Status: DC | PRN
  Administered 2023-08-18: 1 mg via INTRAVENOUS

## 2023-08-18 MED ORDER — RENA-VITE PO TABS
1.0000 | ORAL_TABLET | Freq: Every day | ORAL | Status: DC
  Administered 2023-08-18 – 2023-08-19 (×2): 1 via ORAL
  Filled 2023-08-18 (×2): qty 1

## 2023-08-18 MED ORDER — PHENYLEPHRINE 80 MCG/ML (10ML) SYRINGE FOR IV PUSH (FOR BLOOD PRESSURE SUPPORT)
PREFILLED_SYRINGE | INTRAVENOUS | Status: DC | PRN
  Administered 2023-08-18: 120 ug via INTRAVENOUS

## 2023-08-18 MED ORDER — TRAMADOL HCL 50 MG PO TABS: 50.0000 mg | ORAL_TABLET | Freq: Four times a day (QID) | ORAL | Status: DC | PRN

## 2023-08-18 MED ORDER — LACTATED RINGERS IV SOLN: INTRAVENOUS | Status: DC

## 2023-08-18 MED ORDER — ACETAMINOPHEN 10 MG/ML IV SOLN
INTRAVENOUS | Status: AC
  Filled 2023-08-18: qty 100

## 2023-08-18 MED ORDER — LABETALOL HCL 200 MG PO TABS
200.0000 mg | ORAL_TABLET | Freq: Two times a day (BID) | ORAL | Status: DC
  Administered 2023-08-18: 200 mg via ORAL
  Filled 2023-08-18 (×3): qty 1

## 2023-08-18 MED ORDER — FUROSEMIDE 40 MG PO TABS: 80.0000 mg | ORAL_TABLET | Freq: Every morning | ORAL | Status: DC

## 2023-08-18 MED ORDER — ACETAMINOPHEN 10 MG/ML IV SOLN
1000.0000 mg | Freq: Once | INTRAVENOUS | Status: DC | PRN
  Administered 2023-08-18: 1000 mg via INTRAVENOUS

## 2023-08-18 MED ORDER — MAGNESIUM 200 MG PO TABS: ORAL_TABLET | Freq: Every day | ORAL | Status: DC

## 2023-08-18 MED ORDER — ALBUTEROL SULFATE HFA 108 (90 BASE) MCG/ACT IN AERS: 2.0000 | INHALATION_SPRAY | Freq: Four times a day (QID) | RESPIRATORY_TRACT | Status: DC | PRN

## 2023-08-18 MED ORDER — SODIUM CHLORIDE 0.9 % IV SOLN
INTRAVENOUS | Status: DC | PRN
Start: 2023-08-18 — End: 2023-08-18

## 2023-08-18 MED ORDER — PHENYLEPHRINE HCL-NACL 20-0.9 MG/250ML-% IV SOLN
INTRAVENOUS | Status: DC | PRN
  Administered 2023-08-18: 20 ug/min via INTRAVENOUS

## 2023-08-18 MED ORDER — FENTANYL CITRATE (PF) 100 MCG/2ML IJ SOLN
25.0000 ug | INTRAMUSCULAR | Status: DC | PRN
  Administered 2023-08-18: 25 ug via INTRAVENOUS
  Administered 2023-08-18: 50 ug via INTRAVENOUS

## 2023-08-18 MED ORDER — FENTANYL CITRATE (PF) 250 MCG/5ML IJ SOLN
INTRAMUSCULAR | Status: AC
  Filled 2023-08-18: qty 5

## 2023-08-18 MED ORDER — HEMOSTATIC AGENTS (NO CHARGE) OPTIME
TOPICAL | Status: DC | PRN
Start: 2023-08-18 — End: 2023-08-18
  Administered 2023-08-18: 1 via TOPICAL

## 2023-08-18 MED ORDER — ONDANSETRON HCL 4 MG/2ML IJ SOLN: 4.0000 mg | Freq: Once | INTRAMUSCULAR | Status: DC | PRN

## 2023-08-18 MED ORDER — OXYCODONE HCL 5 MG PO TABS: 5.0000 mg | ORAL_TABLET | Freq: Once | ORAL | Status: DC | PRN

## 2023-08-18 MED ORDER — SUGAMMADEX SODIUM 200 MG/2ML IV SOLN
INTRAVENOUS | Status: DC | PRN
  Administered 2023-08-18: 140 mg via INTRAVENOUS

## 2023-08-18 MED ORDER — DIALYVITE/ZINC PO TABS: 1.0000 | ORAL_TABLET | Freq: Every morning | ORAL | Status: DC

## 2023-08-18 MED ORDER — FLUTICASONE FUROATE-VILANTEROL 100-25 MCG/ACT IN AEPB
1.0000 | INHALATION_SPRAY | Freq: Every day | RESPIRATORY_TRACT | Status: DC
  Filled 2023-08-18: qty 28

## 2023-08-18 MED ORDER — SODIUM CHLORIDE 0.45 % IV SOLN: INTRAVENOUS | Status: DC

## 2023-08-18 MED ORDER — PROPOFOL 10 MG/ML IV BOLUS
INTRAVENOUS | Status: DC | PRN
  Administered 2023-08-18: 50 ug/kg/min via INTRAVENOUS
  Administered 2023-08-18: 150 mg via INTRAVENOUS

## 2023-08-18 MED ORDER — DEXAMETHASONE SODIUM PHOSPHATE 10 MG/ML IJ SOLN
INTRAMUSCULAR | Status: DC | PRN
  Administered 2023-08-18: 8 mg via INTRAVENOUS

## 2023-08-18 MED ORDER — OXYCODONE HCL 5 MG/5ML PO SOLN: 5.0000 mg | Freq: Once | ORAL | Status: DC | PRN

## 2023-08-18 MED ORDER — MIDAZOLAM HCL 2 MG/2ML IJ SOLN
INTRAMUSCULAR | Status: AC
Start: 2023-08-18 — End: 2023-08-18
  Filled 2023-08-18: qty 2

## 2023-08-18 SURGICAL SUPPLY — 45 items
ATTRACTOMAT 16X20 MAGNETIC DRP (DRAPES) ×1 IMPLANT
BAG COUNTER SPONGE SURGICOUNT (BAG) ×1 IMPLANT
BLADE SURG 15 STRL LF DISP TIS (BLADE) ×1 IMPLANT
CANISTER SUCTION 3000ML PPV (SUCTIONS) ×1 IMPLANT
CHLORAPREP W/TINT 26 (MISCELLANEOUS) ×1 IMPLANT
CLIP TI MEDIUM 6 (CLIP) ×1 IMPLANT
CLIP TI WIDE RED SMALL 6 (CLIP) ×1 IMPLANT
CNTNR URN SCR LID CUP LEK RST (MISCELLANEOUS) ×1 IMPLANT
CONTAINER PROTECT SURGISLUSH (MISCELLANEOUS) ×1 IMPLANT
COVER SURGICAL LIGHT HANDLE (MISCELLANEOUS) ×1 IMPLANT
DERMABOND ADVANCED .7 DNX12 (GAUZE/BANDAGES/DRESSINGS) IMPLANT
DRAPE LAPAROTOMY 100X72 PEDS (DRAPES) ×1 IMPLANT
DRAPE UTILITY XL STRL (DRAPES) ×1 IMPLANT
ELECT CAUTERY BLADE 6.4 (BLADE) ×1 IMPLANT
ELECTRODE REM PT RTRN 9FT ADLT (ELECTROSURGICAL) ×1 IMPLANT
GAUZE 4X4 16PLY ~~LOC~~+RFID DBL (SPONGE) ×1 IMPLANT
GAUZE SPONGE 2X2 8PLY STRL LF (GAUZE/BANDAGES/DRESSINGS) ×1 IMPLANT
GLOVE SURG ORTHO 8.0 STRL STRW (GLOVE) ×1 IMPLANT
GOWN STRL REUS W/ TWL LRG LVL3 (GOWN DISPOSABLE) ×1 IMPLANT
GOWN STRL REUS W/ TWL XL LVL3 (GOWN DISPOSABLE) ×1 IMPLANT
HEMOSTAT ARISTA ABSORB 3G PWDR (HEMOSTASIS) IMPLANT
HEMOSTAT SURGICEL 2X4 FIBR (HEMOSTASIS) ×1 IMPLANT
ILLUMINATOR WAVEGUIDE N/F (MISCELLANEOUS) ×1 IMPLANT
KIT BASIN OR (CUSTOM PROCEDURE TRAY) ×1 IMPLANT
KIT TURNOVER KIT B (KITS) ×1 IMPLANT
NDL HYPO 25GX1X1/2 BEV (NEEDLE) ×1 IMPLANT
NEEDLE HYPO 25GX1X1/2 BEV (NEEDLE) ×1 IMPLANT
NS IRRIG 1000ML POUR BTL (IV SOLUTION) ×1 IMPLANT
PACK SRG BSC III STRL LF ECLPS (CUSTOM PROCEDURE TRAY) ×1 IMPLANT
PAD ARMBOARD POSITIONER FOAM (MISCELLANEOUS) ×1 IMPLANT
PENCIL BUTTON HOLSTER BLD 10FT (ELECTRODE) ×1 IMPLANT
POSITIONER HEAD DONUT 9IN (MISCELLANEOUS) ×1 IMPLANT
SHEARS HARMONIC 9CM CVD (BLADE) ×1 IMPLANT
SPONGE INTESTINAL PEANUT (DISPOSABLE) IMPLANT
STRIP CLOSURE SKIN 1/2X4 (GAUZE/BANDAGES/DRESSINGS) ×1 IMPLANT
SUT MNCRL AB 4-0 PS2 18 (SUTURE) ×1 IMPLANT
SUT PROLENE 3 0 SH 48 (SUTURE) IMPLANT
SUT SILK 2-0 18XBRD TIE 12 (SUTURE) IMPLANT
SUT SILK 3-0 18XBRD TIE 12 (SUTURE) IMPLANT
SUT VIC AB 3-0 SH 18 (SUTURE) ×1 IMPLANT
SYR BULB EAR ULCER 3OZ GRN STR (SYRINGE) ×1 IMPLANT
SYR CONTROL 10ML LL (SYRINGE) ×1 IMPLANT
TOWEL GREEN STERILE (TOWEL DISPOSABLE) ×1 IMPLANT
TOWEL GREEN STERILE FF (TOWEL DISPOSABLE) ×1 IMPLANT
TUBE CONNECTING 12X1/4 (SUCTIONS) ×1 IMPLANT

## 2023-08-18 NOTE — Consult Note (Addendum)
 [facility] Renal Consultation Note    Indication for Consultation:  Management of ESRD/hemodialysis, anemia, hypertension/volume, and secondary hyperparathyroidism.  HPI: Colleen Obrien is a 68 y.o. female with ESRD on HD TTS, SLE, HTN who was admitted s/p parathyroid  surgery.  Had been dealing with elevated PTH and hypercalcemia. Sestamibi scan 01/16/23 showed uptake to R inferior gland c/w adenoma. She underwent thyroid  US  03/2023 which showed singular nodule in isthmus. Surgery was scheduled for today. Op note reviewed - ended up being the R superior gland that was the issue - this was removed. The other 3 glands were biopsied but left in place.  Seen in PACU following the above surgery. She is very sleepy but wakes easily and answering questions. No other symptoms aside from mild neck pain associated with the surgery. No fever, chills, CP, dyspnea, abd pain, N/V/D, edema.  Dialyzes on TTS schedule at [facility] clinic. S/p full HD yesterday without issues. Uses L TDC.  Past Medical History:  Diagnosis Date   Anemia in chronic kidney disease (CKD)    Arthritis    Asthma    Blood dyscrasia    systemic lupus   Chronic renal disease, stage 5, glomerular filtration rate less than or equal to 15 mL/min/1.73 square meter (HCC)    Dyspnea    End stage renal disease on dialysis (HCC)    T, TH, S   GERD (gastroesophageal reflux disease)    Gout    Hypertension    Lupus    Narcolepsy    Osteoporosis 05/05/2022   Pneumonia    x 1   Sleep apnea    tries to use CPAP nightly as of 04/29/22   Walker as Engineer, water dependent    when going outside home   Past Surgical History:  Procedure Laterality Date   CESAREAN SECTION     x 2   COLONOSCOPY     DIALYSIS/PERMA CATHETER INSERTION N/A 03/07/2023   Procedure: DIALYSIS/PERMA CATHETER INSERTION;  Surgeon: Colleen Manuelita LABOR, MD;  Location: [facility] INVASIVE CV LAB;  Service: Cardiovascular;  Laterality:  N/A;   ganglion cyst removed Right    arm   TIBIA IM NAIL INSERTION Right 05/03/2022   Procedure: INTRAMEDULLARY (IM) NAIL TIBIAL;  Surgeon: Colleen Sharper, MD;  Location: [facility] OR;  Service: Orthopedics;  Laterality: Right;   TOTAL HIP ARTHROPLASTY Left 08/26/2019   Procedure: LEFT TOTAL HIP ARTHROPLASTY ANTERIOR APPROACH;  Surgeon: Colleen Kay HERO, MD;  Location: [facility] OR;  Service: Orthopedics;  Laterality: Left;   TUBAL LIGATION     TUNNELLED CATHETER EXCHANGE N/A 07/03/2023   Procedure: TUNNELLED CATHETER EXCHANGE;  Surgeon: Colleen Debby SAILOR, MD;  Location: [facility];  Service: Cardiovascular;  Laterality: N/A;   TUNNELLED CATHETER EXCHANGE N/A 07/28/2023   Procedure: TUNNELLED CATHETER EXCHANGE;  Surgeon: Colleen Lonni JINNY, MD;  Location: [facility];  Service: Cardiovascular;  Laterality: N/A;   Family History  Problem Relation Age of Onset   Hypertension Mother    Diabetes Mother    Dementia Father    Hypertension Father    Hyperlipidemia Father    Diabetes Paternal Grandmother    Alcohol abuse Paternal Grandfather    Social History:  reports that she has never smoked. She has never used smokeless tobacco. She reports that she does not drink alcohol and does not use drugs.  ROS: As per HPI otherwise negative.  Physical Exam: Vitals:   08/18/23 1222 08/18/23 1545  BP: 127/88 (!) 150/94  Pulse: 77 83  Resp: 18 13  Temp: 98.2 F (36.8 C) 98.3 F (36.8 C)  TempSrc: Oral   SpO2: 94% 94%  Weight: 70.3 kg   Height: 5' 2 (1.575 m)      General: Well developed, well nourished. NAD. Drowsy, nasal O2 in place. Head: Normocephalic, atraumatic, sclera non-icteric, mucus membranes are moist. Neck: Supple without lymphadenopathy/masses. Glued lateral incision is intact Lungs: Clear bilaterally to auscultation without wheezes, rales, or rhonchi.  Heart: RRR with normal S1, S2. No murmurs, rubs, or gallops appreciated. Abdomen: Soft, non-tender, non-distended with normoactive bowel  sounds.  Musculoskeletal:  Strength and tone appear normal for age. Lower extremities: No edema or ischemic changes, no open wounds. Neuro: Alert and oriented X 3. Moves all extremities spontaneously. Psych:  Responds to questions appropriately with a normal affect. Dialysis Access: TDC in L chest  Allergies  Allergen Reactions   Erythromycin Nausea And Vomiting   Levofloxacin     Causes muscle pain    Sulfa Antibiotics Hives   Latex Other (See Comments)    Skin redness   Prior to Admission medications   Medication Sig Start Date End Date Taking? Authorizing Provider  acetaminophen  (TYLENOL ) 500 MG tablet Take 500 mg by mouth every 6 (six) hours as needed (for pain.).   Yes [provider]  albuterol  (PROVENTIL  HFA;VENTOLIN  HFA) 108 (90 BASE) MCG/ACT inhaler Inhale 2 puffs into the lungs every 6 (six) hours as needed for wheezing or shortness of breath.   Yes [provider]  allopurinol  (ZYLOPRIM ) 300 MG tablet Take 300 mg by mouth in the morning.   Yes [provider]  AURYXIA 1 GM 210 MG(Fe) tablet Take 2 tablets by mouth 3 (three) times daily with meals. 07/31/23 07/26/24 Yes [provider]  B Complex-C-Zn-Folic Acid (DIALYVITE/ZINC) TABS Take 1 tablet by mouth in the morning.   Yes [provider]  budesonide -formoterol  (SYMBICORT) 80-4.5 MCG/ACT inhaler Inhale 2 puffs into the lungs in the morning and at bedtime.   Yes [provider]  Cyanocobalamin (VITAMIN B-12 PO) Take 1 tablet by mouth daily.   Yes [provider]  furosemide  (LASIX ) 80 MG tablet Take 80 mg by mouth in the morning.   Yes [provider]  hydroxychloroquine  (PLAQUENIL ) 200 MG tablet Take 200 mg by mouth in the morning.   Yes [provider]  labetalol  (NORMODYNE ) 200 MG tablet Take 200 mg by mouth 2 (two) times daily. 04/11/22  Yes [provider]  MAGNESIUM  PO Take 1 capsule by mouth daily.   Yes [provider]   methocarbamol  (ROBAXIN ) 500 MG tablet Take 0.5 tablets (250 mg total) by mouth every 8 (eight) hours as needed for muscle spasms. 09/22/22  Yes Colleen Yetta HERO, MD  ondansetron  (ZOFRAN ) 4 MG tablet Take 4 mg by mouth every 8 (eight) hours as needed for nausea or vomiting. 04/21/23  Yes [provider]  pantoprazole (PROTONIX) 40 MG tablet Take 40 mg by mouth daily as needed (indigestion/acid reflux.). 01/06/23  Yes [provider]  sucroferric oxyhydroxide (VELPHORO) 500 MG chewable tablet Chew 1,000 mg by mouth 3 (three) times daily with meals.   Yes [provider]  triamcinolone  cream (KENALOG) 0.1 % Apply 1 Application topically 2 (two) times daily as needed (skin irritation.).   Yes [provider]   Current Facility-Administered Medications  Medication Dose Route Frequency Provider Last Rate Last Admin   acetaminophen  (OFIRMEV ) IV 1,000 mg  1,000 mg Intravenous Once PRN  Colleen Fess, MD       Chlorhexidine  Gluconate Cloth 2 % PADS 6 each  6 each Topical Once Colleen Boas, MD       And   Chlorhexidine  Gluconate Cloth 2 % PADS 6 each  6 each Topical Once Colleen Boas, MD       fentaNYL  (SUBLIMAZE ) injection 25-50 mcg  25-50 mcg Intravenous Q5 min PRN Colleen Fess, MD       lactated ringers  infusion   Intravenous Continuous Colleen Cornet, MD   Stopped at 08/18/23 1435   ondansetron  (ZOFRAN ) injection 4 mg  4 mg Intravenous Once PRN Colleen Fess, MD       oxyCODONE  (Oxy IR/ROXICODONE ) immediate release tablet 5 mg  5 mg Oral Once PRN Colleen Fess, MD       Or   oxyCODONE  (ROXICODONE ) 5 MG/5ML solution 5 mg  5 mg Oral Once PRN Colleen Fess, MD       Labs: Basic Metabolic Panel: Recent Labs  Lab 08/14/23 1040 08/18/23 1306 08/18/23 1319  NA 134* 136 134*  K 3.2* 6.6* 2.9*  CL 94* 99 98  CO2 28  --   --   GLUCOSE 97 85 84  BUN 29* 30* 22  CREATININE 6.53* 5.50* 5.60*  CALCIUM 12.7*  --   --    CBC: Recent Labs  Lab 08/14/23 1040 08/18/23 1306  08/18/23 1319  WBC 5.4  --   --   HGB 11.6* 12.2 11.9*  HCT 35.2* 36.0 35.0*  MCV 91.9  --   --   PLT 190  --   --    CBG: Recent Labs  Lab 08/15/23 1933  GLUCAP 85   Dialysis Orders:  TTS - GOC 4hr, 400/800, EDW 71.5kg, 2K/2C bath, TDC, heparin  3000 unit bolus - no ESA - no VDRA (hypercalcemia) - getting course of IV iron  Assessment/Plan:  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 Give hypercalcemia and the fact that only 1 gland was removed, hopefully hyPOcalcemia/hungry bone syndrome won't be as big of an issue, but will check Ca q 6 hr to determine if needs any Ca supplementation.  ESRD: Continue HD on TTS schedule - next HD tomorrow. ?Varying K levels - repeat in AM.  Hypertension/volume: BP slightly high, continue home meds and UF with HD  Anemia: Hgb > 11, no ESA needed  Metabolic bone disease: Ca/PTH have been very high in light of #1, follow levels. Hold binder (Auryxia) for now.  SLE  Colleen Boehringer, PA-C 08/18/2023, 3:58 PM  [facility]   Seen and examined independently.  Agree with note and exam as documented above by physician extender and as noted here.  ESRD patient on TTS HD presented for resection of R inferior parathyroid  gland adenoma.  Work-up had been initiated due to hypercalcemia.  She had the resection of the adenoma on 7/18.  Other three glands were left intact. She is discussing PD catheter placement and transition to home but a catheter is not yet in place  General adult female in bed in no acute distress HEENT normocephalic atraumatic extraocular movements intact sclera anicteric Neck supple trachea midline; incision over neck c/w procedure  Lungs crackles/wet cough; normal work of breathing at rest; on 2 liters oxygen Heart S1S2 no rub Abdomen soft nontender nondistended Extremities trace edema  Psych normal mood and affect Access left internal jugular tunneled catheter  Right  inferior parathyroid  adenoma - resection on 7/18 with Dr. Eletha  -  monitor calcium every 6 hours for now  - watch for hungry bone  - note that we are unfortunately limited as to what calcium baths are available inpatient from my understanding.  She would possible benefit from 3 calcium bath depending on her trends here   ESRD - HD tomorrow.  Note wet cough. Challenge EDW/UF  Hypokalemia - potassium is variable - I have requested an urgent potassium repeat   HTN  - optimize volume with HD  Anemia of CKD  - no indication for ESA  Metabolic bone disease - monitor for hungry bone as above  Disposition - continue monitoring and Sunday may be a little more likely than Sat for discharge, depending on her calcium trends   Colleen JAYSON Saba, MD 08/18/2023  10:05 PM

## 2023-08-18 NOTE — H&P (Signed)
 PROVIDER: Corrinna Karapetyan Colleen SPINNER, MD   Chief Complaint: Follow-up (Hyperparathyroidism, ESRD)  History of Present Illness:  Patient returns for follow-up to discuss proceeding with parathyroid  surgery. Patient had been evaluated in January 2025. Since that office visit she has started on hemodialysis and has a left subclavian dialysis catheter in place. She dialyzes on Tuesdays Thursdays and Saturdays under the direction of Dr. Katheryn Saba. Patient underwent an ultrasound examination at my request in February 2025. This showed a mildly enlarged heterogeneous thyroid  gland with a solid nodule in the thyroid  isthmus measuring 2.2 cm. No enlarged parathyroid  tissue was identified. Patient had had a previous nuclear medicine parathyroid  scan with sestamibi performed in December 2024. This had localized a right inferior parathyroid  adenoma. Laboratory studies have been somewhat difficult to decipher. Patient is taking Sensipar. Her most recent calcium level is at the upper range of normal at 10.2. Her most recent PTH level is 820. She presents today accompanied by her family to discuss surgical options for management.   Review of Systems: A complete review of systems was obtained from the patient. I have reviewed this information and discussed as appropriate with the patient. See HPI as well for other ROS.  Review of Systems  Constitutional: Positive for malaise/fatigue.  Gastrointestinal: Positive for abdominal pain.  Musculoskeletal: Positive for joint pain.    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   Patient Active Problem List  Diagnosis  CKD (chronic kidney  disease) stage 5, GFR less than 15 ml/min (CMS/HHS-HCC)  Gout, joint  Sleep apnea  Narcolepsy (HHS-HCC)  SLE (systemic lupus erythematosus) (CMS/HHS-HCC)  Asthma (HHS-HCC)  Narcolepsy and cataplexy (HHS-HCC)  Excessive daytime sleepiness  Hypertension  Osteoporosis  Anemia due to chronic kidney disease  Pre-transplant evaluation for kidney transplant  Hx of fracture of tibia  Secondary hyperparathyroidism of renal origin (CMS/HHS-HCC)   Past Surgical History:  Procedure Laterality Date  IM NAILING TIBIA 05/03/2022  SIMPLE EXERCISE TEST(6 MIN WALK) 02/08/2023  CESAREAN SECTION  x 2  COLONOSCOPY  gangian cysyt  LAPAROSCOPIC TUBAL LIGATION  Left Hip replacemnt    Allergies  Allergen Reactions  Levofloxacin Muscle Pain  Causes muscle pain  Causes muscle pain   Other Other (See Comments)  Nuts, causes sneezing and coughing  Sulfa (Sulfonamide Antibiotics) Hives  Erythromycin Nausea   Current Outpatient Medications on File Prior to Visit  Medication Sig Dispense Refill  acetaminophen  (TYLENOL ) 325 MG tablet Take 325 mg by mouth every 6 (six) hours  albuterol  90 mcg/actuation inhaler Inhale into the lungs  allopurinoL  (ZYLOPRIM ) 100 MG tablet Take 200 mg by mouth once daily  budesonide -formoteroL  (SYMBICORT) 160-4.5 mcg/actuation inhaler Inhale into the lungs  cinacalcet (SENSIPAR) 90 MG tablet Take 120 mg by mouth once daily  colchicine  (COLCRYS ) 0.6 mg tablet Take 0.6 mg by mouth once daily as needed  DIALYVITE 1-100-300-50 mg-mg-mcg-mg Tab Take 1 tablet by mouth once daily  felodipine  (PLENDIL ) 5 MG ER tablet Take 5 mg by mouth once daily  ferrous sulfate 325 (65 FE) MG tablet Take 325 mg by mouth  FUROsemide  (LASIX ) 80 MG tablet Take 80 mg by mouth  hydroxychloroquine  (PLAQUENIL ) 200 mg tablet Take 100 mg by mouth once daily  hydrOXYzine  (ATARAX )  50 MG tablet 1 tablet at bedtime as needed Orally Once a day; Duration: 30 days  labetaloL  (TRANDATE ) 100 MG tablet Take 1  tablet by mouth 2 (two) times daily  methocarbamoL  (ROBAXIN ) 500 MG tablet Take 500 mg by mouth 3 (three) times daily  methoxy peg-epoetin  beta (MIRCERA INJ) 60 mcg by IV Push route  ondansetron  (ZOFRAN ) 4 MG tablet Take 1 tablet by mouth  pantoprazole sodium (PANTOPRAZOLE ORAL) Take 20 mg by mouth  sucroferric oxyhydroxide (VELPHORO ORAL) Take 1 tablet by mouth 3 (three) times daily with meals  triamcinolone  0.1 % ointment APPLY TWICE A DAY X 2 WEEKS THEN HOLD  apixaban  (ELIQUIS ) 2.5 mg tablet Take 2.5 mg by mouth every 12 (twelve) hours (Patient not taking: Reported on 12/20/2022)  denosumab  (PROLIA ) 60 mg/mL inj syringe Inject subcutaneously (Patient not taking: Reported on 07/06/2023)  dextroamphetamine -amphetamine  (ADDERALL) 5 mg tablet (Patient not taking: Reported on 07/06/2023)  fluticasone  propionate (FLONASE) 50 mcg/actuation nasal spray 1 spray by Each Nare route daily as needed. (Patient not taking: Reported on 07/06/2023)  valACYclovir (VALTREX) 1000 MG tablet Take by mouth (Patient not taking: Reported on 07/06/2023)   No current facility-administered medications on file prior to visit.   Family History  Problem Relation Age of Onset  High blood pressure (Hypertension) Mother  Dementia Father  High blood pressure (Hypertension) Father  No Known Problems Son  No Known Problems Son  Chronic kidney disease Neg Hx    Social History   Tobacco Use  Smoking Status Never  Smokeless Tobacco Never    Social History   Socioeconomic History  Marital status: Married  Spouse name: Lynwood  Number of children: 2  Tobacco Use  Smoking status: Never  Smokeless tobacco: Never  Vaping Use  Vaping status: Never Used  Substance and Sexual Activity  Alcohol use: Never  Drug use: Never  Sexual activity: Yes  Social History Narrative  Married. Retired. Spouse will be support   Social Drivers of Health   Food Insecurity: No Food Insecurity (09/19/2022)  Received from [facility]   Hunger Vital Sign  Worried About Running Out of Food in the Last Year: Never true  Ran Out of Food in the Last Year: Never true  Transportation Needs: No Transportation Needs (09/19/2022)  Received from [facility] - Transportation  Lack of Transportation (Medical): No  Lack of Transportation (Non-Medical): No  Housing Stability: Unknown (02/08/2023)  Housing Stability Vital Sign  Homeless in the Last Year: No   Objective:   Vitals:  Weight: 71.7 kg (158 lb)  Height: 157.5 cm (5' 2)  PainSc: 0-No pain   Body mass index is 28.9 kg/m.  Physical Exam   GENERAL APPEARANCE Comfortable, no acute issues Development: normal Gross deformities: none  SKIN Rash, lesions, ulcers: none Induration, erythema: none Nodules: none palpable  EYES Conjunctiva and lids: normal Pupils: equal  EARS, NOSE, MOUTH, THROAT External ears: no lesion or deformity External nose: no lesion or deformity Hearing: grossly normal  NECK Symmetric: yes Trachea: midline Thyroid : no palpable nodules in the thyroid  bed  CHEST/CV Not assessed  ABDOMEN Not assessed  GENITOURINARY/RECTAL Not assessed  LYMPHATIC Cervical: none palpable Supraclavicular: none palpable  PSYCHIATRIC Oriented to person, place, and time: yes Mood and affect: normal for situation Judgment and insight: appropriate for situation   Assessment and Plan:   Secondary hyperparathyroidism of renal origin (CMS/HHS-HCC)  Patient returns for follow-up having undergone ultrasound of the neck. We reviewed these results today as well as  the results from her nuclear medicine parathyroid  scan from December 2024.  It is difficult to know for sure whether this represents single gland disease consistent with primary hyperparathyroidism or whether this is multi gland disease consistent with secondary hyperparathyroidism of renal origin. Therefore we discussed operative strategies. I have recommended proceeding with neck  exploration. We will plan to do this under general anesthesia. We will first address the right inferior parathyroid  gland. If this appears to be a parathyroid  adenoma, we will obtain a frozen section biopsy intraoperatively. We will also explore the other 3 parathyroid  glands. If they appear normal. The patient may just require a parathyroidectomy of the right inferior gland. If it appears that all 4 glands are hyperplastic, she may require a subtotal parathyroidectomy or even a total parathyroidectomy with autotransplantation to the forearm. We discussed these operations today. We also discussed the potential surgical management of the 2.2 cm thyroid  nodule in the thyroid  isthmus. If this is not technically difficult, we may excise this at the time of surgery. We discussed a potential overnight hospital stay.  Patient and her husband understand and agree to proceed. We will make arrangements for surgery at a time convenient for the patient in the near future. We will involve the nephrology team during her hospital stay.   [name], MD [facility] practice Office: [phone]

## 2023-08-18 NOTE — Progress Notes (Signed)
 Pt roomed to unit from PACU this evening at 1835hrs s/p neck exploration and right superior parathyroidectomy. Alert, oriented and able to express needs, no concerns voiced at time of admission. Husband at bedside

## 2023-08-18 NOTE — Anesthesia Procedure Notes (Signed)
 Procedure Name: Intubation Date/Time: 08/18/2023 1:42 PM  Performed by: Delores Duwaine SAUNDERS, CRNAPre-anesthesia Checklist: Patient identified, Emergency Drugs available, Suction available and Patient being monitored Patient Re-evaluated:Patient Re-evaluated prior to induction Oxygen Delivery Method: Circle System Utilized Preoxygenation: Pre-oxygenation with 100% oxygen Induction Type: IV induction Ventilation: Mask ventilation without difficulty Laryngoscope Size: Mac and 3 Grade View: Grade I Tube type: Oral Tube size: 6.5 mm Number of attempts: 1 Airway Equipment and Method: Stylet and Oral airway Placement Confirmation: ETT inserted through vocal cords under direct vision, positive ETCO2 and breath sounds checked- equal and bilateral Secured at: 21 cm Tube secured with: Tape Dental Injury: Teeth and Oropharynx as per pre-operative assessment

## 2023-08-18 NOTE — Transfer of Care (Signed)
 Immediate Anesthesia Transfer of Care Note  Patient: Colleen Obrien  Procedure(s) Performed: PARATHYROIDECTOMY  Patient Location: PACU  Anesthesia Type:General  Level of Consciousness: drowsy and patient cooperative  Airway & Oxygen Therapy: Patient Spontanous Breathing and Patient connected to nasal cannula oxygen  Post-op Assessment: Report given to RN, Post -op Vital signs reviewed and stable, and Patient moving all extremities X 4  Post vital signs: Reviewed and stable  Last Vitals:  Vitals Value Taken Time  BP 150/94 08/18/23 15:48  Temp    Pulse 81 08/18/23 15:49  Resp 12 08/18/23 15:49  SpO2 94 % 08/18/23 15:49  Vitals shown include unfiled device data.  Last Pain:  Vitals:   08/18/23 1315  TempSrc:   PainSc: 0-No pain         Complications: No notable events documented.

## 2023-08-18 NOTE — Op Note (Signed)
 Operative Note  Pre-operative Diagnosis:  hyperparathyroidism, ESRD  Post-operative Diagnosis:  same  Surgeon:  [name], MD  Assistant:  Cathlyn Idler, MD   Procedure: Neck exploration, right superior parathyroidectomy, biopsy of right inferior parathyroid  and left superior parathyroid  and left inferior parathyroid   Anesthesia: General  Estimated Blood Loss: 25 cc  Drains: None         Specimen: Right superior parathyroid  gland to pathology.  Biopsies of the remaining 3 parathyroid  glands to pathology.  Frozen section biopsy is reviewed with Dr. Norleen Dover.  Indications:  Patient had been evaluated in January 2025. Since that office visit she has started on hemodialysis and has a left subclavian dialysis catheter in place. She dialyzes on Tuesdays Thursdays and Saturdays under the direction of Dr. Katheryn Saba. Patient underwent an ultrasound examination at my request in February 2025. This showed a mildly enlarged heterogeneous thyroid  gland with a solid nodule in the thyroid  isthmus measuring 2.2 cm. No enlarged parathyroid  tissue was identified. Patient had had a previous nuclear medicine parathyroid  scan with sestamibi performed in December 2024. This had localized a right inferior parathyroid  adenoma. Laboratory studies have been somewhat difficult to decipher. Patient is taking Sensipar. Her most recent calcium level is at the upper range of normal at 10.2. Her most recent PTH level is 820. She presents today accompanied by her family to discuss surgical options for management.   It is difficult to know for sure whether this represents single gland disease consistent with primary hyperparathyroidism or whether this is multi gland disease consistent with secondary hyperparathyroidism of renal origin. Therefore we discussed operative strategies. I have recommended proceeding with neck exploration. We will plan to do this under general anesthesia. We will first address the right inferior  parathyroid  gland. If this appears to be a parathyroid  adenoma, we will obtain a frozen section biopsy intraoperatively. We will also explore the other 3 parathyroid  glands. If they appear normal. The patient may just require a parathyroidectomy of the right inferior gland. If it appears that all 4 glands are hyperplastic, she may require a subtotal parathyroidectomy or even a total parathyroidectomy with autotransplantation to the forearm. We discussed these operations today. We also discussed the potential surgical management of the 2.2 cm thyroid  nodule in the thyroid  isthmus. If this is not technically difficult, we may excise this at the time of surgery. We discussed a potential overnight hospital stay.  Procedure:  The patient was seen in the pre-op holding area. The risks, benefits, complications, treatment options, and expected outcomes were previously discussed with the patient. The patient agreed with the proposed plan and has signed the informed consent form.  The patient was brought to the operating room by the surgical team, identified as Colleen Obrien and the procedure verified. A time out was completed and the above information confirmed.  Following administration of general anesthesia, the patient is positioned and then prepped and draped in the usual aseptic fashion.  After ascertaining then an adequate level of anesthesia been achieved, a Kocher incision is made with a #15 blade.  Dissection is carried through subcutaneous tissues.  Platysma is divided.  Subplatysmal flaps are developed cephalad and caudad from the thyroid  notch to the sternal notch.  Self-retaining retractors placed for exposure.  Strap muscles are incised in the midline.  Dissection has begun on the left side.  Strap muscles were reflected laterally exposing an enlarged left thyroid  lobe.  There are no discrete or dominant nodules palpable.  The left  lobe is mobilized.  A left superior parathyroid  gland is  identified.  This appears enlarged but overall relatively normal in morphology.  Further dissection on the left reveals a left inferior parathyroid  gland at the inferior pole of the thyroid  lobe.  This also appears enlarged but of relatively normal morphology.  Dry pack is placed in the left neck.  Strap muscles are reflected to the right.  Right thyroid  lobe is exposed.  Right thyroid  lobe is enlarged but without discrete or dominant nodule.  Exploration reveals an enlarged right superior parathyroid  gland.  This gland appears grossly abnormal.  It is markedly enlarged measuring about 3 cm in greatest dimension.  It is lobular.  It appears to have a relatively smooth capsule.  The gland is carefully dissected out and mobilized.  Vascular pedicle was divided between medium ligaclips with the harmonic scalpel.  The gland is submitted to pathology for frozen section which confirms hypercellular parathyroid  tissue with fat depletion and an expansive growth pattern.  Further dissection on the right side reveals an enlarged right inferior parathyroid  gland.  This has a relatively normal-appearing morphology consistent with the 2 glands found on the left side.  Biopsy is then taken from each of the remaining parathyroid  glands in the right inferior position, left superior position, and left inferior position.  Biopsy was performed using a large Ligaclip across the parenchyma followed by excision of the parathyroid  tissue followed by placement of a 3-0 Prolene suture for identification purposes.  The tissue obtained from biopsy was submitted to pathology and was reviewed by Dr. Norleen Dover.  This tissue was felt to be hypercellular but compared to the right superior gland had a more normal lobular morphology.  Based on these findings, decision is made to leave the remaining parathyroid  tissue in situ.  Each of the remaining glands was marked with a Ligaclip and Prolene suture.  The right superior gland had been  completely resected.  Neck was irrigated with warm saline.  Good hemostasis is achieved throughout the operative field.  Palpation of the thyroid  gland does not identify a nodule in the thyroid  isthmus which would be easily amenable to resection.  Therefore thyroid  resection was not performed.  Fibrillar was placed throughout the operative field.  Strap muscles were reapproximated in the midline of interrupted 3-0 Vicryl sutures.  Platysma was closed with interrupted 3-0 Vicryl sutures.  Skin edges are reapproximated with a running 4-0 Monocryl subcuticular suture.  Wound was washed and dried and Dermabond was applied as dressing.  Patient is awakened from anesthesia and transferred to the recovery room in stable condition.  The patient tolerated the procedure well.  Nephrology service is notified of the patient's admission following surgery.  They will evaluate the patient for her need for hemodialysis tomorrow.   [name], MD [facility] Office: [phone]

## 2023-08-18 NOTE — Discharge Instructions (Signed)

## 2023-08-18 NOTE — Interval H&P Note (Signed)
 History and Physical Interval Note:  08/18/2023 12:51 PM  Colleen Obrien  has presented today for surgery, with the diagnosis of HYPERPARATHYROIDISM THYROID  NODULE.  The various methods of treatment have been discussed with the patient and family. After consideration of risks, benefits and other options for treatment, the patient has consented to    Procedure(s) with comments: PARATHYROIDECTOMY (N/A) - NECK EXPLORATION WITH PARATHYROIDECTOMY POSSIBLE EXCISION THYROID  NODULE as a surgical intervention.    The patient's history has been reviewed, patient examined, no change in status, stable for surgery.  I have reviewed the patient's chart and labs.  Questions were answered to the patient's satisfaction.    [name], MD [facility] practice Office: [phone]   [name]

## 2023-08-19 DIAGNOSIS — N186 End stage renal disease: Secondary | ICD-10-CM | POA: Diagnosis not present

## 2023-08-19 DIAGNOSIS — E041 Nontoxic single thyroid nodule: Secondary | ICD-10-CM | POA: Diagnosis not present

## 2023-08-19 DIAGNOSIS — G4733 Obstructive sleep apnea (adult) (pediatric): Secondary | ICD-10-CM | POA: Diagnosis not present

## 2023-08-19 DIAGNOSIS — J45909 Unspecified asthma, uncomplicated: Secondary | ICD-10-CM | POA: Diagnosis not present

## 2023-08-19 DIAGNOSIS — I120 Hypertensive chronic kidney disease with stage 5 chronic kidney disease or end stage renal disease: Secondary | ICD-10-CM | POA: Diagnosis not present

## 2023-08-19 DIAGNOSIS — Z992 Dependence on renal dialysis: Secondary | ICD-10-CM | POA: Diagnosis not present

## 2023-08-19 DIAGNOSIS — M3214 Glomerular disease in systemic lupus erythematosus: Secondary | ICD-10-CM | POA: Diagnosis not present

## 2023-08-19 DIAGNOSIS — I10 Essential (primary) hypertension: Secondary | ICD-10-CM | POA: Diagnosis not present

## 2023-08-19 DIAGNOSIS — N2581 Secondary hyperparathyroidism of renal origin: Secondary | ICD-10-CM | POA: Diagnosis not present

## 2023-08-19 LAB — CBC
HCT: 27.9 % — ABNORMAL LOW (ref 36.0–46.0)
Hemoglobin: 9 g/dL — ABNORMAL LOW (ref 12.0–15.0)
MCH: 29.9 pg (ref 26.0–34.0)
MCHC: 32.3 g/dL (ref 30.0–36.0)
MCV: 92.7 fL (ref 80.0–100.0)
Platelets: 147 K/uL — ABNORMAL LOW (ref 150–400)
RBC: 3.01 MIL/uL — ABNORMAL LOW (ref 3.87–5.11)
RDW: 14.6 % (ref 11.5–15.5)
WBC: 7.4 K/uL (ref 4.0–10.5)
nRBC: 0 % (ref 0.0–0.2)

## 2023-08-19 LAB — RENAL FUNCTION PANEL
Albumin: 2.7 g/dL — ABNORMAL LOW (ref 3.5–5.0)
Anion gap: 11 (ref 5–15)
BUN: 35 mg/dL — ABNORMAL HIGH (ref 8–23)
CO2: 27 mmol/L (ref 22–32)
Calcium: 9.5 mg/dL (ref 8.9–10.3)
Chloride: 96 mmol/L — ABNORMAL LOW (ref 98–111)
Creatinine, Ser: 6.69 mg/dL — ABNORMAL HIGH (ref 0.44–1.00)
GFR, Estimated: 6 mL/min — ABNORMAL LOW (ref >60)
Glucose, Bld: 116 mg/dL — ABNORMAL HIGH (ref 70–99)
Phosphorus: 5.2 mg/dL — ABNORMAL HIGH (ref 2.5–4.6)
Potassium: 3.5 mmol/L (ref 3.5–5.1)
Sodium: 134 mmol/L — ABNORMAL LOW (ref 135–145)

## 2023-08-19 LAB — CALCIUM
Calcium: 10.1 mg/dL (ref 8.9–10.3)
Calcium: 9.4 mg/dL (ref 8.9–10.3)

## 2023-08-19 LAB — MAGNESIUM: Magnesium: 1.9 mg/dL (ref 1.7–2.4)

## 2023-08-19 LAB — POTASSIUM: Potassium: 3.4 mmol/L — ABNORMAL LOW (ref 3.5–5.1)

## 2023-08-19 LAB — HEPATITIS B SURFACE ANTIBODY, QUANTITATIVE: Hep B S AB Quant (Post): <3.5 m[IU]/mL — ABNORMAL LOW

## 2023-08-19 MED ORDER — HEPARIN SODIUM (PORCINE) 1000 UNIT/ML IJ SOLN
INTRAMUSCULAR | Status: AC
Start: 2023-08-19 — End: 2023-08-19
  Filled 2023-08-19: qty 4

## 2023-08-19 MED ORDER — LABETALOL HCL 100 MG PO TABS
100.0000 mg | ORAL_TABLET | Freq: Two times a day (BID) | ORAL | Status: DC
  Filled 2023-08-19: qty 1

## 2023-08-19 MED ORDER — HEPARIN SODIUM (PORCINE) 1000 UNIT/ML DIALYSIS
1000.0000 [IU] | INTRAMUSCULAR | Status: DC | PRN
  Administered 2023-08-19: 1000 [IU]

## 2023-08-19 MED ORDER — ALTEPLASE 2 MG IJ SOLR: 2.0000 mg | Freq: Once | INTRAMUSCULAR | Status: DC | PRN

## 2023-08-19 MED ORDER — PHENOL 1.4 % MT LIQD
1.0000 | OROMUCOSAL | Status: DC | PRN
  Administered 2023-08-19: 1 via OROMUCOSAL
  Filled 2023-08-19: qty 177

## 2023-08-19 MED ORDER — LABETALOL HCL 100 MG PO TABS: 100.0000 mg | ORAL_TABLET | Freq: Two times a day (BID) | ORAL | 0 refills | Status: DC

## 2023-08-19 MED ORDER — GUAIFENESIN-DM 100-10 MG/5ML PO SYRP
10.0000 mL | ORAL_SOLUTION | ORAL | Status: DC | PRN
  Administered 2023-08-19: 10 mL via ORAL
  Filled 2023-08-19: qty 10

## 2023-08-19 NOTE — Progress Notes (Signed)
 [facility] Discharge Summary   Patient ID: Colleen Obrien MRN: [id number] DOB/AGE: [birth year] 68 y.o.  Admit date: 08/18/2023 Discharge date: 08/19/2023  Admitting Diagnosis: hyperparathyroidism  Discharge Diagnosis Patient Active Problem List   Diagnosis Date Noted   Hyperparathyroidism, primary (HCC) 08/18/2023   Thyroid  nodule, uninodular 08/18/2023   Primary hyperparathyroidism (HCC) 08/18/2023   Trochanteric bursitis, left hip 10/20/2022   Multifocal pneumonia 09/18/2022   Acute renal failure superimposed on stage 5 chronic kidney disease, not on chronic dialysis (HCC) 09/18/2022   Positive D dimer 09/18/2022   Metabolic acidosis 09/18/2022   Lupus    Asthma    Hypertension    Osteoporosis 05/05/2022   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  Procedures Dr. Eletha 08/18/23  Procedure: Neck exploration, right superior parathyroidectomy, biopsy of right inferior parathyroid  and left superior parathyroid  and left inferior parathyroid    Indications:  Patient had been evaluated in January 2025. Since that office visit she has started on hemodialysis and has a left subclavian dialysis catheter in place. She dialyzes on Tuesdays Thursdays and Saturdays under the direction of Dr. Katheryn Saba. Patient underwent an ultrasound examination at my request in February 2025. This showed a mildly enlarged heterogeneous thyroid  gland with a solid nodule in the  thyroid  isthmus measuring 2.2 cm. No enlarged parathyroid  tissue was identified. Patient had had a previous nuclear medicine parathyroid  scan with sestamibi performed in December 2024. This had localized a right inferior parathyroid  adenoma. Laboratory studies have been somewhat difficult to decipher. Patient is taking Sensipar. Her most recent calcium level is at the upper range of normal at 10.2. Her most recent PTH level is 820. She presents today accompanied by her family to discuss surgical options for management.   Hospital Course:  She was admitted for above procedure. Tolerated the procedure well. Underwent dialysis on POD#1. Diet was advanced as tolerated.  On POD#1, the patient was voiding well, tolerating diet, ambulating well, pain well controlled, vital signs stable, incisions c/d/i and felt stable for discharge home.  Patient will follow up in our office with Dr. Eletha and knows to call with questions or concerns.    I have personally reviewed the patients medication history on the  controlled substance database.     Physical Exam: General:  Alert, NAD, pleasant, comfortable HEENT: surgical incision anterior neck c/d/I w/ dermabond, there is no bleeding, no hematoma, no cellulitis.  Abd:  Soft, ND, nontender  Allergies as of 08/19/2023       Reactions   Erythromycin Nausea And Vomiting   Levofloxacin    Causes muscle pain    Sulfa Antibiotics Hives   Latex Other (See Comments)   Skin redness        Medication List     TAKE these medications    acetaminophen  500 MG tablet Commonly known as: TYLENOL  Take 500 mg by mouth every 6 (six) hours as needed (for pain.).   albuterol  108 (90 Base) MCG/ACT inhaler Commonly known as: VENTOLIN  HFA  Inhale 2 puffs into the lungs every 6 (six) hours as needed for wheezing or shortness of breath.   allopurinol  300 MG tablet Commonly known as: ZYLOPRIM  Take 300 mg by mouth in the morning.   Auryxia 1 GM 210 MG(Fe)  tablet Generic drug: ferric citrate Take 2 tablets by mouth 3 (three) times daily with meals.   budesonide -formoterol  80-4.5 MCG/ACT inhaler Commonly known as: SYMBICORT Inhale 2 puffs into the lungs in the morning and at bedtime.   Dialyvite /Zinc  Tabs Take 1 tablet by mouth in the morning.   furosemide  80 MG tablet Commonly known as: LASIX  Take 80 mg by mouth in the morning.   hydroxychloroquine  200 MG tablet Commonly known as: PLAQUENIL  Take 200 mg by mouth in the morning.   labetalol  100 MG tablet Commonly known as: NORMODYNE  Take 1 tablet (100 mg total) by mouth 2 (two) times daily. What changed:  medication strength how much to take   MAGNESIUM  PO Take 1 capsule by mouth daily.   methocarbamol  500 MG tablet Commonly known as: ROBAXIN  Take 0.5 tablets (250 mg total) by mouth every 8 (eight) hours as needed for muscle spasms.   ondansetron  4 MG tablet Commonly known as: ZOFRAN  Take 4 mg by mouth every 8 (eight) hours as needed for nausea or vomiting.   pantoprazole  40 MG tablet Commonly known as: PROTONIX  Take 40 mg by mouth daily as needed (indigestion/acid reflux.).   traMADol  50 MG tablet Commonly known as: ULTRAM  Take 1-2 tablets (50-100 mg total) by mouth every 6 (six) hours as needed for moderate pain (pain score 4-6).   triamcinolone  cream 0.1 % Commonly known as: KENALOG Apply 1 Application topically 2 (two) times daily as needed (skin irritation.).   Velphoro 500 MG chewable tablet Generic drug: sucroferric oxyhydroxide Chew 1,000 mg by mouth 3 (three) times daily with meals.   VITAMIN B-12 PO Take 1 tablet by mouth daily.          Follow-up Information     Eletha Boas, MD. Schedule an appointment as soon as possible for a visit in 3 week(s).   Specialty: General Surgery Why: For wound re-check Contact information: [address] [place] [address] [place] [address] [phone]                 Signed: Almarie Pringle,  [facility] 08/19/2023, 11:49 AM

## 2023-08-19 NOTE — Plan of Care (Signed)
  Problem: Education: Goal: Knowledge of General Education information will improve Description: Including pain rating scale, medication(s)/side effects and non-pharmacologic comfort measures Outcome: Progressing   Problem: Clinical Measurements: Goal: Ability to maintain clinical measurements within normal limits will improve Outcome: Progressing Goal: Will remain free from infection Outcome: Progressing Goal: Diagnostic test results will improve Outcome: Progressing   Problem: Activity: Goal: Risk for activity intolerance will decrease Outcome: Progressing   Problem: Nutrition: Goal: Adequate nutrition will be maintained Outcome: Progressing   Problem: Coping: Goal: Level of anxiety will decrease Outcome: Progressing   

## 2023-08-19 NOTE — Progress Notes (Addendum)
 [facility] Progress Note   Subjective:   Patient seen and examined at bedside.  Feeling ok this AM.  Neck and throat a little sore following surgery but manageable.  Wearing O2, which she does not normally do at home.  Denies CP, SOB, edema, abdominal pain and n/v/d.    Objective Vitals:   08/18/23 2142 08/19/23 0459 08/19/23 0500 08/19/23 0740  BP: 110/72 119/81  116/80  Pulse: 77 75  75  Resp: 18 17  16   Temp: 98.7 F (37.1 C) 98.1 F (36.7 C)  (!) 97.5 F (36.4 C)  TempSrc: Oral Oral  Oral  SpO2: 99% 93%  98%  Weight:   74.4 kg   Height:       Physical Exam General:well appearing female in NAD Heart:RRR Lungs:faint crackles in bases, nml WOB on @L  O2 Abdomen:soft, NTND Extremities:no LE edema Dialysis Access: [facility]   Filed Weights   08/18/23 1222 08/19/23 0500  Weight: 70.3 kg 74.4 kg    Intake/Output Summary (Last 24 hours) at 08/19/2023 0856 Last data filed at 08/19/2023 0003 Gross per 24 hour  Intake 1060 ml  Output 70 ml  Net 990 ml    Additional Objective Labs: Basic Metabolic Panel: Recent Labs  Lab 08/14/23 1040 08/18/23 1306 08/18/23 1319 08/18/23 1843 08/18/23 2343 08/19/23 0420 08/19/23 0421  NA 134* 136 134*  --   --   --  134*  K 3.2* 6.6* 2.9*  --  3.4*  --  3.5  CL 94* 99 98  --   --   --  96*  CO2 28  --   --   --   --   --  27  GLUCOSE 97 85 84  --   --   --  116*  BUN 29* 30* 22  --   --   --  35*  CREATININE 6.53* 5.50* 5.60*  --   --   --  6.69*  CALCIUM 12.7*  --   --    < > 10.1 9.4 9.5  PHOS  --   --   --   --   --   --  5.2*   < > = values in this interval not displayed.   Liver Function Tests: Recent Labs  Lab 08/19/23 0421  ALBUMIN  2.7*   CBC: Recent Labs  Lab 08/14/23 1040 08/18/23 1306 08/18/23 1319  WBC 5.4  --   --   HGB 11.6* 12.2 11.9*  HCT 35.2* 36.0 35.0*  MCV 91.9  --   --   PLT 190  --   --    Blood Culture    Component Value Date/Time   SDES  09/18/2022 1451    BLOOD BLOOD LEFT  ARM Performed at [facility], [address] [place], [place] [address]    SDES  09/18/2022 1451    BLOOD BLOOD RIGHT ARM Performed at [facility], [address] [place], [place] [address]    [facility]  09/18/2022 1451    BOTTLES DRAWN AEROBIC AND ANAEROBIC Blood Culture adequate volume Performed at [facility], [address], [place], [place] [address]    [facility]  09/18/2022 1451    BOTTLES DRAWN AEROBIC AND ANAEROBIC Blood Culture adequate volume Performed at [facility], [address], [place], [place] [address]    CULT  09/18/2022 1451    NO GROWTH 5 DAYS Performed at [facility], [address], [place], [place] [address]  CULT  09/18/2022 1451    NO GROWTH 5 DAYS Performed at [facility], [address], [place], [place] [address]    REPTSTATUS 09/23/2022 FINAL 09/18/2022 1451   REPTSTATUS 09/23/2022 FINAL 09/18/2022 1451    CBG: Recent Labs  Lab 08/15/23 1933  GLUCAP 85   Lab Results  Component Value Date   INR 1.0 08/21/2019   INR 0.98 07/13/2016   Studies/Results: No results found.  Medications:   allopurinol   300 mg Oral q AM   fluticasone  furoate-vilanterol  1 puff Inhalation Daily   hydroxychloroquine   200 mg Oral q AM   labetalol   200 mg Oral BID   magnesium  oxide  200 mg Oral Daily   multivitamin  1 tablet Oral Daily    Dialysis Orders: TTS - GOC 4hr, 400/800, EDW 71.5kg, 2K/2C bath, TDC, heparin  3000 unit bolus - no ESA - no VDRA (hypercalcemia) - getting course of IV iron   Assessment/Plan:  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 Previously hypercalcemic.  Calcium level with small drop to normal range.  No signs of hungry bone syndrome so far or need for Ca supplementation.    ESRD: Continue HD on TTS schedule - HD today per regular schedule. Conflicting K levels initially, now  staying on lower side. Use increased K bath per protocol. Continue to monitor.   Hypertension/volume: BP  in goal.  Continue home meds. Faint crackles in bases, UF as tolerated.   Anemia: Hgb > 11, no ESA needed  Metabolic bone disease: Ca/PTH have been very high in light of #1, follow levels. Hold binder (Auryxia) for now. Phos in goal. Ca improving. Will check PTH as outpatient.   SLE  Manuelita Labella, PA-C [facility] 08/19/2023,8:56 AM  LOS: 0 days    Seen and examined independently.  Agree with note and exam as documented above by physician extender and as noted here.  Seen and examined on dialysis.  Blood pressure 95/71 and HR 69.  Tolerating goal.    Hyperparathyroidism and s/p parathyroid  adenoma resection. Calcium ok. Weekly calcium checks outpatient  ESRD - on HD TTS  HTN - reduce labetolol to 100 mg BID  Disposition per primary team.  Weekly calcium checks with HD for now.   Katheryn JAYSON Saba, MD 08/19/2023  11:17 AM

## 2023-08-19 NOTE — Discharge Summary (Signed)
 [facility] Discharge Summary   Patient ID: Colleen Obrien MRN: [id number] DOB/AGE: [birth year] 68 y.o.  Admit date: 08/18/2023 Discharge date: 08/19/2023  Admitting Diagnosis: hyperparathyroidism  Discharge Diagnosis Patient Active Problem List   Diagnosis Date Noted   Hyperparathyroidism, primary (HCC) 08/18/2023   Thyroid  nodule, uninodular 08/18/2023   Primary hyperparathyroidism (HCC) 08/18/2023   Trochanteric bursitis, left hip 10/20/2022   Multifocal pneumonia 09/18/2022   Acute renal failure superimposed on stage 5 chronic kidney disease, not on chronic dialysis (HCC) 09/18/2022   Positive D dimer 09/18/2022   Metabolic acidosis 09/18/2022   Lupus    Asthma    Hypertension    Osteoporosis 05/05/2022   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  Procedures Dr. Eletha 08/18/23  Procedure: Neck exploration, right superior parathyroidectomy, biopsy of right inferior parathyroid  and left superior parathyroid  and left inferior parathyroid    Indications:  Patient had been evaluated in January 2025. Since that office visit she has started on hemodialysis and has a left subclavian dialysis catheter in place. She dialyzes on Tuesdays Thursdays and Saturdays under the direction of Dr. Katheryn Saba. Patient underwent an ultrasound examination at my request in February 2025. This showed a mildly enlarged heterogeneous thyroid  gland with a solid nodule in the  thyroid  isthmus measuring 2.2 cm. No enlarged parathyroid  tissue was identified. Patient had had a previous nuclear medicine parathyroid  scan with sestamibi performed in December 2024. This had localized a right inferior parathyroid  adenoma. Laboratory studies have been somewhat difficult to decipher. Patient is taking Sensipar. Her most recent calcium level is at the upper range of normal at 10.2. Her most recent PTH level is 820. She presents today accompanied by her family to discuss surgical options for management.   Hospital Course:  She was admitted for above procedure. Tolerated the procedure well. Underwent dialysis on POD#1. Diet was advanced as tolerated.  On POD#1, the patient was voiding well, tolerating diet, ambulating well, pain well controlled, vital signs stable, incisions c/d/i and felt stable for discharge home.  Patient will follow up in our office with Dr. Eletha and knows to call with questions or concerns.    I have personally reviewed the patients medication history on the  controlled substance database.     Physical Exam: General:  Alert, NAD, pleasant, comfortable HEENT: surgical incision anterior neck c/d/I w/ dermabond, there is no bleeding, no hematoma, no cellulitis.  Abd:  Soft, ND, nontender  Allergies as of 08/19/2023       Reactions   Erythromycin Nausea And Vomiting   Levofloxacin    Causes muscle pain    Sulfa Antibiotics Hives   Latex Other (See Comments)   Skin redness        Medication List     TAKE these medications    acetaminophen  500 MG tablet Commonly known as: TYLENOL  Take 500 mg by mouth every 6 (six) hours as needed (for pain.).   albuterol  108 (90 Base) MCG/ACT inhaler Commonly known as: VENTOLIN  HFA  Inhale 2 puffs into the lungs every 6 (six) hours as needed for wheezing or shortness of breath.   allopurinol  300 MG tablet Commonly known as: ZYLOPRIM  Take 300 mg by mouth in the morning.   Auryxia 1 GM 210 MG(Fe)  tablet Generic drug: ferric citrate Take 2 tablets by mouth 3 (three) times daily with meals.   budesonide -formoterol  80-4.5 MCG/ACT inhaler Commonly known as: SYMBICORT Inhale 2 puffs into the lungs in the morning and at bedtime.   Dialyvite /Zinc  Tabs Take 1 tablet by mouth in the morning.   furosemide  80 MG tablet Commonly known as: LASIX  Take 80 mg by mouth in the morning.   hydroxychloroquine  200 MG tablet Commonly known as: PLAQUENIL  Take 200 mg by mouth in the morning.   labetalol  100 MG tablet Commonly known as: NORMODYNE  Take 1 tablet (100 mg total) by mouth 2 (two) times daily. What changed:  medication strength how much to take   MAGNESIUM  PO Take 1 capsule by mouth daily.   methocarbamol  500 MG tablet Commonly known as: ROBAXIN  Take 0.5 tablets (250 mg total) by mouth every 8 (eight) hours as needed for muscle spasms.   ondansetron  4 MG tablet Commonly known as: ZOFRAN  Take 4 mg by mouth every 8 (eight) hours as needed for nausea or vomiting.   pantoprazole  40 MG tablet Commonly known as: PROTONIX  Take 40 mg by mouth daily as needed (indigestion/acid reflux.).   traMADol  50 MG tablet Commonly known as: ULTRAM  Take 1-2 tablets (50-100 mg total) by mouth every 6 (six) hours as needed for moderate pain (pain score 4-6).   triamcinolone  cream 0.1 % Commonly known as: KENALOG Apply 1 Application topically 2 (two) times daily as needed (skin irritation.).   Velphoro 500 MG chewable tablet Generic drug: sucroferric oxyhydroxide Chew 1,000 mg by mouth 3 (three) times daily with meals.   VITAMIN B-12 PO Take 1 tablet by mouth daily.          Follow-up Information     Eletha Boas, MD. Schedule an appointment as soon as possible for a visit in 3 week(s).   Specialty: General Surgery Why: For wound re-check Contact information: [address] [place] [address] [place] [address] [phone]                 Signed: Almarie Pringle,  [facility] 08/19/2023, 11:49 AM

## 2023-08-19 NOTE — Progress Notes (Signed)
   08/19/23 1305  Vitals  Temp 98.3 F (36.8 C)  Pulse Rate 68  Resp 16  BP 105/68  SpO2 94 %  O2 Device Room Air  Weight (S)  73.3 kg (Bed Scale)  Type of Weight Post-Dialysis  Oxygen Therapy  Pulse Oximetry Type Continuous  Post Treatment  Dialyzer Clearance Clear  Hemodialysis Intake (mL) 0 mL  Liters Processed 72  Fluid Removed (mL) 2000 mL  Tolerated HD Treatment Yes  Post-Hemodialysis Comments Pt. Treatment completed without difficulties. Pt UF goal met per order and Report call to 6N Bedside RN   Received patient in bed to unit.  Alert and oriented.  Informed consent signed and in chart.   TX duration: 3  Patient tolerated well.  Transported back to the room  Alert, without acute distress.  Hand-off given to patient's nurse.   Access used: Yes Access issues: No  Total UF removed: 2000 Medication(s) given: See MAR Post HD VS: See Above GRID Post HD weight: 73.3 kg   [facility]

## 2023-08-20 ENCOUNTER — Telehealth: Payer: Self-pay | Admitting: Nephrology

## 2023-08-20 NOTE — Discharge Planning (Signed)
 [facility] Patient Discharge Orders- [facility] CLINIC: GOC  Patient's name: Colleen Obrien Admit/DC Dates: 08/18/2023 - 08/19/2023  Discharge Diagnoses:  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    Hypercalcemia - improving  HD ORDER CHANGES: Heparin  change: no EDW Change: no Bath Change: no       ANEMIA MANAGEMENT: Aranesp: Given: no    PRBC's Given: no  ESA dose for discharge: none IV Iron dose at discharge: no change   BONE/MINERAL MEDICATIONS: Hectorol/Calcitriol  change: no Sensipar/Parsabiv change: no   ACCESS INTERVENTION/CHANGE: no Details:   RECENT LABS: Recent Labs  Lab 08/19/23 0421  K 3.5  CALCIUM 9.5  ALBUMIN  2.7*  PHOS 5.2*   Recent Labs  Lab 08/19/23 1026  HGB 9.0*     IV ANTIBIOTICS: no Details:   OTHER ANTICOAGULATION:  On Eliquis : no On Coumadin: no   OTHER/APPTS/LAB ORDERS: -Check weekly Ca    D/C Meds to be reconciled by nurse after every discharge.  Completed By: Manuelita Labella PA-C   Reviewed by: MD:______ RN_______

## 2023-08-20 NOTE — Telephone Encounter (Signed)
 Transition of Care Contact from Inpatient Facility  Date of Discharge: 08/19/23 Date of Contact: 08/20/23 Method of contact: phone - attempted, No answer  Attempted to contact patient to discuss transition of care from inpatient admission.  Patient did not answer the phone.  Will attempt to call them again and if unable to reach will follow up at dialysis.  Manuelita Labella, PA-C [employer]

## 2023-08-21 ENCOUNTER — Encounter (HOSPITAL_COMMUNITY): Payer: Self-pay | Admitting: Surgery

## 2023-08-21 LAB — SURGICAL PATHOLOGY

## 2023-08-21 NOTE — TOC Transition Note (Signed)
 Transition of Care - Initial Contact after Hospitalization  Date of discharge: 08/19/2023  Date of contact: 08/21/23  Method: Second Attempted Phone Call Spoke to: No answer  Patient contacted for the 2nd time to discuss transition of care from recent inpatient hospitalization but she didn't answer the phone.    Patient will return to her outpatient HD unit on: Tuesday July 22nd at [facility]  Charmaine Piety, NP

## 2023-08-21 NOTE — Progress Notes (Signed)
 Late Note entry- August 21, 2023  Pt was d/c on Saturday. Contacted Geronimo Car this morning to be advised of pt's d/c date and that pt should resume care tomorrow.   Randine Mungo Renal Navigator [phone]

## 2023-08-22 DIAGNOSIS — Z992 Dependence on renal dialysis: Secondary | ICD-10-CM | POA: Diagnosis not present

## 2023-08-22 DIAGNOSIS — N2581 Secondary hyperparathyroidism of renal origin: Secondary | ICD-10-CM | POA: Diagnosis not present

## 2023-08-22 DIAGNOSIS — N186 End stage renal disease: Secondary | ICD-10-CM | POA: Diagnosis not present

## 2023-08-22 DIAGNOSIS — I120 Hypertensive chronic kidney disease with stage 5 chronic kidney disease or end stage renal disease: Secondary | ICD-10-CM | POA: Diagnosis not present

## 2023-08-22 NOTE — Anesthesia Postprocedure Evaluation (Signed)
 Anesthesia Post Note  Patient: Colleen Obrien  Procedure(s) Performed: PARATHYROIDECTOMY     Patient location during evaluation: PACU Anesthesia Type: General Level of consciousness: awake and alert Pain management: pain level controlled Vital Signs Assessment: post-procedure vital signs reviewed and stable Respiratory status: spontaneous breathing, nonlabored ventilation and respiratory function stable Cardiovascular status: blood pressure returned to baseline and stable Postop Assessment: no apparent nausea or vomiting Anesthetic complications: no   No notable events documented.                  Syd Newsome

## 2023-08-23 DIAGNOSIS — R052 Subacute cough: Secondary | ICD-10-CM | POA: Diagnosis not present

## 2023-08-23 DIAGNOSIS — K219 Gastro-esophageal reflux disease without esophagitis: Secondary | ICD-10-CM | POA: Diagnosis not present

## 2023-08-24 DIAGNOSIS — N2581 Secondary hyperparathyroidism of renal origin: Secondary | ICD-10-CM | POA: Diagnosis not present

## 2023-08-24 DIAGNOSIS — N186 End stage renal disease: Secondary | ICD-10-CM | POA: Diagnosis not present

## 2023-08-24 DIAGNOSIS — Z992 Dependence on renal dialysis: Secondary | ICD-10-CM | POA: Diagnosis not present

## 2023-08-26 DIAGNOSIS — N186 End stage renal disease: Secondary | ICD-10-CM | POA: Diagnosis not present

## 2023-08-26 DIAGNOSIS — Z992 Dependence on renal dialysis: Secondary | ICD-10-CM | POA: Diagnosis not present

## 2023-08-26 DIAGNOSIS — N2581 Secondary hyperparathyroidism of renal origin: Secondary | ICD-10-CM | POA: Diagnosis not present

## 2023-08-28 DIAGNOSIS — M25551 Pain in right hip: Secondary | ICD-10-CM | POA: Diagnosis not present

## 2023-08-29 DIAGNOSIS — N186 End stage renal disease: Secondary | ICD-10-CM | POA: Diagnosis not present

## 2023-08-29 DIAGNOSIS — N2581 Secondary hyperparathyroidism of renal origin: Secondary | ICD-10-CM | POA: Diagnosis not present

## 2023-08-29 DIAGNOSIS — Z992 Dependence on renal dialysis: Secondary | ICD-10-CM | POA: Diagnosis not present

## 2023-08-30 ENCOUNTER — Ambulatory Visit: Admitting: Orthopaedic Surgery

## 2023-08-30 DIAGNOSIS — M25561 Pain in right knee: Secondary | ICD-10-CM | POA: Diagnosis not present

## 2023-08-31 DIAGNOSIS — M810 Age-related osteoporosis without current pathological fracture: Secondary | ICD-10-CM | POA: Diagnosis not present

## 2023-08-31 DIAGNOSIS — M3214 Glomerular disease in systemic lupus erythematosus: Secondary | ICD-10-CM | POA: Diagnosis not present

## 2023-08-31 DIAGNOSIS — N186 End stage renal disease: Secondary | ICD-10-CM | POA: Diagnosis not present

## 2023-08-31 DIAGNOSIS — N2581 Secondary hyperparathyroidism of renal origin: Secondary | ICD-10-CM | POA: Diagnosis not present

## 2023-08-31 DIAGNOSIS — I120 Hypertensive chronic kidney disease with stage 5 chronic kidney disease or end stage renal disease: Secondary | ICD-10-CM | POA: Diagnosis not present

## 2023-08-31 DIAGNOSIS — Z992 Dependence on renal dialysis: Secondary | ICD-10-CM | POA: Diagnosis not present

## 2023-08-31 DIAGNOSIS — N185 Chronic kidney disease, stage 5: Secondary | ICD-10-CM | POA: Diagnosis not present

## 2023-08-31 DIAGNOSIS — Z8701 Personal history of pneumonia (recurrent): Secondary | ICD-10-CM | POA: Diagnosis not present

## 2023-09-02 DIAGNOSIS — Z992 Dependence on renal dialysis: Secondary | ICD-10-CM | POA: Diagnosis not present

## 2023-09-02 DIAGNOSIS — N186 End stage renal disease: Secondary | ICD-10-CM | POA: Diagnosis not present

## 2023-09-02 DIAGNOSIS — N2581 Secondary hyperparathyroidism of renal origin: Secondary | ICD-10-CM | POA: Diagnosis not present

## 2023-09-04 ENCOUNTER — Other Ambulatory Visit: Payer: Self-pay | Admitting: Family Medicine

## 2023-09-04 DIAGNOSIS — Z1231 Encounter for screening mammogram for malignant neoplasm of breast: Secondary | ICD-10-CM

## 2023-09-05 DIAGNOSIS — N186 End stage renal disease: Secondary | ICD-10-CM | POA: Diagnosis not present

## 2023-09-05 DIAGNOSIS — Z992 Dependence on renal dialysis: Secondary | ICD-10-CM | POA: Diagnosis not present

## 2023-09-05 DIAGNOSIS — N2581 Secondary hyperparathyroidism of renal origin: Secondary | ICD-10-CM | POA: Diagnosis not present

## 2023-09-07 DIAGNOSIS — N2581 Secondary hyperparathyroidism of renal origin: Secondary | ICD-10-CM | POA: Diagnosis not present

## 2023-09-07 DIAGNOSIS — N186 End stage renal disease: Secondary | ICD-10-CM | POA: Diagnosis not present

## 2023-09-07 DIAGNOSIS — Z992 Dependence on renal dialysis: Secondary | ICD-10-CM | POA: Diagnosis not present

## 2023-09-09 DIAGNOSIS — Z992 Dependence on renal dialysis: Secondary | ICD-10-CM | POA: Diagnosis not present

## 2023-09-09 DIAGNOSIS — N186 End stage renal disease: Secondary | ICD-10-CM | POA: Diagnosis not present

## 2023-09-09 DIAGNOSIS — N2581 Secondary hyperparathyroidism of renal origin: Secondary | ICD-10-CM | POA: Diagnosis not present

## 2023-09-11 DIAGNOSIS — M25551 Pain in right hip: Secondary | ICD-10-CM | POA: Diagnosis not present

## 2023-09-11 DIAGNOSIS — M546 Pain in thoracic spine: Secondary | ICD-10-CM | POA: Diagnosis not present

## 2023-09-12 DIAGNOSIS — Z992 Dependence on renal dialysis: Secondary | ICD-10-CM | POA: Diagnosis not present

## 2023-09-12 DIAGNOSIS — N186 End stage renal disease: Secondary | ICD-10-CM | POA: Diagnosis not present

## 2023-09-12 DIAGNOSIS — N2581 Secondary hyperparathyroidism of renal origin: Secondary | ICD-10-CM | POA: Diagnosis not present

## 2023-09-13 NOTE — Addendum Note (Signed)
 Addendum  created 09/13/23 1636 by Leopoldo Bruckner, MD   Intraprocedure Staff edited

## 2023-09-14 DIAGNOSIS — N186 End stage renal disease: Secondary | ICD-10-CM | POA: Diagnosis not present

## 2023-09-14 DIAGNOSIS — Z992 Dependence on renal dialysis: Secondary | ICD-10-CM | POA: Diagnosis not present

## 2023-09-14 DIAGNOSIS — N2581 Secondary hyperparathyroidism of renal origin: Secondary | ICD-10-CM | POA: Diagnosis not present

## 2023-09-16 DIAGNOSIS — Z992 Dependence on renal dialysis: Secondary | ICD-10-CM | POA: Diagnosis not present

## 2023-09-16 DIAGNOSIS — N2581 Secondary hyperparathyroidism of renal origin: Secondary | ICD-10-CM | POA: Diagnosis not present

## 2023-09-16 DIAGNOSIS — N186 End stage renal disease: Secondary | ICD-10-CM | POA: Diagnosis not present

## 2023-09-19 DIAGNOSIS — N2581 Secondary hyperparathyroidism of renal origin: Secondary | ICD-10-CM | POA: Diagnosis not present

## 2023-09-19 DIAGNOSIS — Z992 Dependence on renal dialysis: Secondary | ICD-10-CM | POA: Diagnosis not present

## 2023-09-19 DIAGNOSIS — N186 End stage renal disease: Secondary | ICD-10-CM | POA: Diagnosis not present

## 2023-09-20 ENCOUNTER — Other Ambulatory Visit: Payer: Self-pay

## 2023-09-20 DIAGNOSIS — N186 End stage renal disease: Secondary | ICD-10-CM

## 2023-09-21 DIAGNOSIS — N186 End stage renal disease: Secondary | ICD-10-CM | POA: Diagnosis not present

## 2023-09-21 DIAGNOSIS — N2581 Secondary hyperparathyroidism of renal origin: Secondary | ICD-10-CM | POA: Diagnosis not present

## 2023-09-21 DIAGNOSIS — Z992 Dependence on renal dialysis: Secondary | ICD-10-CM | POA: Diagnosis not present

## 2023-09-23 DIAGNOSIS — N186 End stage renal disease: Secondary | ICD-10-CM | POA: Diagnosis not present

## 2023-09-23 DIAGNOSIS — Z992 Dependence on renal dialysis: Secondary | ICD-10-CM | POA: Diagnosis not present

## 2023-09-23 DIAGNOSIS — N2581 Secondary hyperparathyroidism of renal origin: Secondary | ICD-10-CM | POA: Diagnosis not present

## 2023-09-25 DIAGNOSIS — H2513 Age-related nuclear cataract, bilateral: Secondary | ICD-10-CM | POA: Diagnosis not present

## 2023-09-25 DIAGNOSIS — H40003 Preglaucoma, unspecified, bilateral: Secondary | ICD-10-CM | POA: Diagnosis not present

## 2023-09-25 DIAGNOSIS — Z0100 Encounter for examination of eyes and vision without abnormal findings: Secondary | ICD-10-CM | POA: Diagnosis not present

## 2023-09-25 DIAGNOSIS — Z79899 Other long term (current) drug therapy: Secondary | ICD-10-CM | POA: Diagnosis not present

## 2023-09-25 DIAGNOSIS — M329 Systemic lupus erythematosus, unspecified: Secondary | ICD-10-CM | POA: Diagnosis not present

## 2023-09-26 DIAGNOSIS — Z992 Dependence on renal dialysis: Secondary | ICD-10-CM | POA: Diagnosis not present

## 2023-09-26 DIAGNOSIS — N186 End stage renal disease: Secondary | ICD-10-CM | POA: Diagnosis not present

## 2023-09-26 DIAGNOSIS — N2581 Secondary hyperparathyroidism of renal origin: Secondary | ICD-10-CM | POA: Diagnosis not present

## 2023-09-27 ENCOUNTER — Ambulatory Visit

## 2023-09-27 ENCOUNTER — Ambulatory Visit
Admission: RE | Admit: 2023-09-27 | Discharge: 2023-09-27 | Disposition: A | Discharge status: Home / Self Care | Source: Ambulatory Visit | Attending: Family Medicine | Admitting: Family Medicine

## 2023-09-27 DIAGNOSIS — Z1231 Encounter for screening mammogram for malignant neoplasm of breast: Secondary | ICD-10-CM

## 2023-09-28 DIAGNOSIS — Z992 Dependence on renal dialysis: Secondary | ICD-10-CM | POA: Diagnosis not present

## 2023-09-28 DIAGNOSIS — N2581 Secondary hyperparathyroidism of renal origin: Secondary | ICD-10-CM | POA: Diagnosis not present

## 2023-09-28 DIAGNOSIS — N186 End stage renal disease: Secondary | ICD-10-CM | POA: Diagnosis not present

## 2023-09-29 ENCOUNTER — Encounter (HOSPITAL_COMMUNITY): Payer: Self-pay

## 2023-09-30 DIAGNOSIS — N2581 Secondary hyperparathyroidism of renal origin: Secondary | ICD-10-CM | POA: Diagnosis not present

## 2023-09-30 DIAGNOSIS — Z992 Dependence on renal dialysis: Secondary | ICD-10-CM | POA: Diagnosis not present

## 2023-09-30 DIAGNOSIS — N186 End stage renal disease: Secondary | ICD-10-CM | POA: Diagnosis not present

## 2023-10-01 DIAGNOSIS — M3214 Glomerular disease in systemic lupus erythematosus: Secondary | ICD-10-CM | POA: Diagnosis not present

## 2023-10-01 DIAGNOSIS — I120 Hypertensive chronic kidney disease with stage 5 chronic kidney disease or end stage renal disease: Secondary | ICD-10-CM | POA: Diagnosis not present

## 2023-10-01 DIAGNOSIS — N185 Chronic kidney disease, stage 5: Secondary | ICD-10-CM | POA: Diagnosis not present

## 2023-10-01 DIAGNOSIS — Z8701 Personal history of pneumonia (recurrent): Secondary | ICD-10-CM | POA: Diagnosis not present

## 2023-10-01 DIAGNOSIS — N186 End stage renal disease: Secondary | ICD-10-CM | POA: Diagnosis not present

## 2023-10-01 DIAGNOSIS — Z992 Dependence on renal dialysis: Secondary | ICD-10-CM | POA: Diagnosis not present

## 2023-10-01 DIAGNOSIS — M810 Age-related osteoporosis without current pathological fracture: Secondary | ICD-10-CM | POA: Diagnosis not present

## 2023-10-03 DIAGNOSIS — N2581 Secondary hyperparathyroidism of renal origin: Secondary | ICD-10-CM | POA: Diagnosis not present

## 2023-10-03 DIAGNOSIS — N186 End stage renal disease: Secondary | ICD-10-CM | POA: Diagnosis not present

## 2023-10-03 DIAGNOSIS — Z992 Dependence on renal dialysis: Secondary | ICD-10-CM | POA: Diagnosis not present

## 2023-10-04 ENCOUNTER — Encounter (HOSPITAL_COMMUNITY): Payer: Self-pay | Admitting: Vascular Surgery

## 2023-10-04 ENCOUNTER — Other Ambulatory Visit: Payer: Self-pay

## 2023-10-04 ENCOUNTER — Encounter (HOSPITAL_COMMUNITY): Payer: Self-pay

## 2023-10-04 NOTE — Progress Notes (Signed)
 PCP - Candace Smith,MD Cardiologist - denies Nephrologist - Katheryn Saba, MD  PPM/ICD - denies Device Orders -  Rep Notified -   Chest x-ray - 08/15/23 EKG - 01/28/23 Stress Test - 08/01/22 CE ECHO - 02/08/23 CE Cardiac Cath - denies  Sleep Study - 02/02/17 CPAP - + OSA, has CPAP, but unable to tolerate it. It suffocates me  Fasting Blood Sugar - na Checks Blood Sugar _____ times a day  Last dose of GLP1 agonist-  na GLP1 instructions:   Blood Thinner Instructions:na Aspirin  Instructions:na  ERAS Protcol -no PRE-SURGERY Ensure or G2-   COVID TEST- na   Anesthesia review:yes- OSA-rarely uses CPAP,narcolepsy,lupus,HTN,anemia,ESRD  Patient denies shortness of breath, fever, cough and chest pain at PAT appointment   All instructions explained to the patient, with a verbal understanding of the material. Patient agrees to go over the instructions while at home for a better understanding. Patient also instructed to self quarantine after being tested for COVID-19. The opportunity to ask questions was provided.

## 2023-10-05 DIAGNOSIS — Z992 Dependence on renal dialysis: Secondary | ICD-10-CM | POA: Diagnosis not present

## 2023-10-05 DIAGNOSIS — N186 End stage renal disease: Secondary | ICD-10-CM | POA: Diagnosis not present

## 2023-10-05 DIAGNOSIS — N2581 Secondary hyperparathyroidism of renal origin: Secondary | ICD-10-CM | POA: Diagnosis not present

## 2023-10-05 NOTE — Progress Notes (Signed)
 Anesthesia Chart Review: SAME DAY WORK-UP  Case: [id number] Date/Time: 10/06/23 0715   Procedure: INSERTION, CATHETER, DIALYSIS, PERITONEAL   Anesthesia type: Choice   Diagnosis: ESRD (end stage renal disease) (HCC) [N18.6]   Pre-op diagnosis: ESRD   [facility]   Surgeons: Sheree Penne Bruckner, MD       DISCUSSION: Patient is a 68 year old female scheduled for the above procedure.   History includes never smoker, HTN, lupus (SLE, diagnosed 1982), narcolepsy, ESRD (lupus nephritis, diagnosed 2006; HD initiated ~ 03/2023), hyperparathyroidism (s/p right superior parathyroidectomies with bilateral biopsies 08/18/2023), OSA (intolerant to CPAP), narcolepsy, hiatal hernia, GERD, dyspnea, asthma, anemia, arthritis, gout.   Anesthesia team to evaluate on the day of surgery.    VS:  Wt Readings from Last 3 Encounters:  08/19/23 (S) 73.3 kg  08/15/23 70.6 kg  08/14/23 70.6 kg   BP Readings from Last 3 Encounters:  08/19/23 105/68  08/16/23 (!) 159/89  08/14/23 (!) 136/96   Pulse Readings from Last 3 Encounters:  08/19/23 68  08/16/23 77  08/14/23 80     PROVIDERS: Claudene Pellet, MD is PCP  Bertrum Chroman, MD is rheumatologist   LABS: For day of surgery. H/H 9/27.9 on 08/19/2023.  IMAGES: CXR 08/15/2023: FINDINGS: Moderate to large hiatal hernia present. Cardiomediastinal silhouette is within normal limits. There are atelectatic changes in the right middle lobe. There is no pleural effusion or pneumothorax. Left chest port catheter tip ends in the SVC. There are degenerative changes of the right shoulder. IMPRESSION: 1. Moderate to large hiatal hernia. 2. Atelectatic changes in the right middle lobe.   EKG: 01/28/2023: SR   CV: 6 min Walk Test 06/16/2023 ([facility]): Physician Interp: Six minute walk distance is mildly below the  normal predicted range.  Oxygenation during exercise was adequate (SpO2 =>90%) without the  use of supplemental O2.   At peak exercise, the heart rate indicated cardiovascular  maximums had been reached.  Perceived dyspnea at end of exercise was absent or mild.   Echo 02/08/2023 ([facility]): NORMAL LEFT VENTRICULAR SYSTOLIC FUNCTION WITH MILD LVH  ESTIMATED EF: 55%, CALC EF(3D): 62%  NORMAL LA PRESSURES WITH NORMAL DIASTOLIC FUNCTION  NORMAL RIGHT VENTRICULAR SYSTOLIC FUNCTION  VALVULAR REGURGITATION: No AR, TRIVIAL MR, TRIVIAL PR, MODERATE TR  NO VALVULAR STENOSIS   WITH NO RESPIRATORY FLOW VARIATION   PET Myocardial perfusion study 08/01/2022 ([facility]): Conclusions:   1. Myocardial perfusion is normal without evidence of ischemia or infarct.   2. Global  myocardial blood flow reserve is abnormal suggestive of microvascular dysfunction.   3. Normal left ventricular systolic function.     Past Medical History:  Diagnosis Date   Anemia in chronic kidney disease (CKD)    Arthritis    Asthma    Blood dyscrasia    systemic lupus   Chronic renal disease, stage 5, glomerular filtration rate less than or equal to 15 mL/min/1.73 square meter (HCC)    Dyspnea    End stage renal disease on dialysis (HCC)    T, TH, S   GERD (gastroesophageal reflux disease)    Gout    Hypertension    Lupus    Narcolepsy    Osteoporosis 05/05/2022   Pneumonia    x 1   Sleep apnea    tries to use CPAP nightly as of 04/29/22   Walker as Engineer, water dependent    when going outside home    Past  Surgical History:  Procedure Laterality Date   CESAREAN SECTION     x 2   COLONOSCOPY     DIALYSIS/PERMA CATHETER INSERTION N/A 03/07/2023   Procedure: DIALYSIS/PERMA CATHETER INSERTION;  Surgeon: Norine Manuelita LABOR, MD;  [facility];  Service: Cardiovascular;  Laterality: N/A;   ganglion cyst removed Right    arm   PARATHYROIDECTOMY N/A 08/18/2023   Procedure: PARATHYROIDECTOMY;  Surgeon: Eletha Boas, MD;  [facility]: [facility];  Service: General;  Laterality: N/A;  NECK EXPLORATION WITH  PARATHYROIDECTOMY POSSIBLE EXCISION THYROID  NODULE   TIBIA IM NAIL INSERTION Right 05/03/2022   Procedure: INTRAMEDULLARY (IM) NAIL TIBIAL;  Surgeon: Celena Sharper, MD;  [facility];  Service: Orthopedics;  Laterality: Right;   TOTAL HIP ARTHROPLASTY Left 08/26/2019   Procedure: LEFT TOTAL HIP ARTHROPLASTY ANTERIOR APPROACH;  Surgeon: Jerri Kay HERO, MD;  [facility];  Service: Orthopedics;  Laterality: Left;   TUBAL LIGATION     TUNNELLED CATHETER EXCHANGE N/A 07/03/2023   Procedure: TUNNELLED CATHETER EXCHANGE;  Surgeon: Magda Debby SAILOR, MD;  Location: [facility];  Service: Cardiovascular;  Laterality: N/A;   TUNNELLED CATHETER EXCHANGE N/A 07/28/2023   Procedure: TUNNELLED CATHETER EXCHANGE;  Surgeon: Gretta Lonni PARAS, MD;  Location: [facility];  Service: Cardiovascular;  Laterality: N/A;    MEDICATIONS: No current facility-administered medications for this encounter.    acetaminophen  (TYLENOL ) 500 MG tablet   albuterol  (PROVENTIL  HFA;VENTOLIN  HFA) 108 (90 BASE) MCG/ACT inhaler   allopurinol  (ZYLOPRIM ) 300 MG tablet   AURYXIA 1 GM 210 MG(Fe) tablet   B Complex-C-Zn-Folic Acid  (DIALYVITE /ZINC ) TABS   budesonide -formoterol  (SYMBICORT) 80-4.5 MCG/ACT inhaler   cinacalcet (SENSIPAR) 30 MG tablet   Cyanocobalamin (VITAMIN B-12 PO)   furosemide  (LASIX ) 80 MG tablet   hydroxychloroquine  (PLAQUENIL ) 200 MG tablet   labetalol  (NORMODYNE ) 100 MG tablet   methocarbamol  (ROBAXIN ) 500 MG tablet   ondansetron  (ZOFRAN ) 4 MG tablet   pantoprazole  (PROTONIX ) 40 MG tablet   traMADol  (ULTRAM ) 50 MG tablet   triamcinolone  cream (KENALOG) 0.1 %    Adylin Hankey, PA-C Surgical Short Stay/Anesthesiology [facility] Phone [phone] [facility] Phone [phone] 10/05/2023 1:06 PM

## 2023-10-05 NOTE — Anesthesia Preprocedure Evaluation (Signed)
 Anesthesia Evaluation  Patient identified by MRN, date of birth, ID band Patient awake    Reviewed: Allergy & Precautions, NPO status , Patient's Chart, lab work & pertinent test results  Airway Mallampati: II  TM Distance: >3 FB Neck ROM: Full    Dental  (+) Teeth Intact, Dental Advisory Given   Pulmonary asthma , sleep apnea    breath sounds clear to auscultation       Cardiovascular hypertension, Pt. on medications and Pt. on home beta blockers  Rhythm:Regular Rate:Normal     Neuro/Psych negative neurological ROS  negative psych ROS   GI/Hepatic Neg liver ROS,GERD  Medicated,,  Endo/Other    Renal/GU CRF, ESRF and DialysisRenal disease     Musculoskeletal  (+) Arthritis ,    Abdominal   Peds  Hematology  (+) Blood dyscrasia, anemia   Anesthesia Other Findings   Reproductive/Obstetrics                              Anesthesia Physical Anesthesia Plan  ASA: 3  Anesthesia Plan: General   Post-op Pain Management: Tylenol  PO (pre-op)*   Induction: Intravenous  PONV Risk Score and Plan: 4 or greater and Ondansetron , Dexamethasone  and Treatment may vary due to age or medical condition  Airway Management Planned: Oral ETT  Additional Equipment: None  Intra-op Plan:   Post-operative Plan: Extubation in OR  Informed Consent: I have reviewed the patients History and Physical, chart, labs and discussed the procedure including the risks, benefits and alternatives for the proposed anesthesia with the patient or authorized representative who has indicated his/her understanding and acceptance.     Dental advisory given  Plan Discussed with: CRNA  Anesthesia Plan Comments: (PAT note written 10/05/2023 by Caz Weaver, PA-C.  )         Anesthesia Quick Evaluation

## 2023-10-06 ENCOUNTER — Other Ambulatory Visit (HOSPITAL_COMMUNITY): Payer: Self-pay

## 2023-10-06 ENCOUNTER — Encounter (HOSPITAL_COMMUNITY)
Admission: RE | Disposition: A | Discharge status: Home / Self Care | Payer: Self-pay | Source: Home / Self Care | Attending: Vascular Surgery

## 2023-10-06 ENCOUNTER — Ambulatory Visit (HOSPITAL_COMMUNITY): Payer: Self-pay | Admitting: Vascular Surgery

## 2023-10-06 ENCOUNTER — Ambulatory Visit (HOSPITAL_BASED_OUTPATIENT_CLINIC_OR_DEPARTMENT_OTHER): Payer: Self-pay | Admitting: Vascular Surgery

## 2023-10-06 ENCOUNTER — Ambulatory Visit (HOSPITAL_COMMUNITY)
Admission: RE | Admit: 2023-10-06 | Discharge: 2023-10-06 | Disposition: A | Discharge status: Home / Self Care | Attending: Vascular Surgery | Admitting: Vascular Surgery

## 2023-10-06 ENCOUNTER — Other Ambulatory Visit: Payer: Self-pay

## 2023-10-06 DIAGNOSIS — N186 End stage renal disease: Secondary | ICD-10-CM

## 2023-10-06 DIAGNOSIS — Z993 Dependence on wheelchair: Secondary | ICD-10-CM | POA: Insufficient documentation

## 2023-10-06 DIAGNOSIS — Z7951 Long term (current) use of inhaled steroids: Secondary | ICD-10-CM | POA: Insufficient documentation

## 2023-10-06 DIAGNOSIS — J45909 Unspecified asthma, uncomplicated: Secondary | ICD-10-CM | POA: Diagnosis not present

## 2023-10-06 DIAGNOSIS — G47419 Narcolepsy without cataplexy: Secondary | ICD-10-CM | POA: Insufficient documentation

## 2023-10-06 DIAGNOSIS — G4730 Sleep apnea, unspecified: Secondary | ICD-10-CM | POA: Insufficient documentation

## 2023-10-06 DIAGNOSIS — I120 Hypertensive chronic kidney disease with stage 5 chronic kidney disease or end stage renal disease: Secondary | ICD-10-CM | POA: Diagnosis not present

## 2023-10-06 DIAGNOSIS — Z992 Dependence on renal dialysis: Secondary | ICD-10-CM | POA: Diagnosis not present

## 2023-10-06 DIAGNOSIS — K219 Gastro-esophageal reflux disease without esophagitis: Secondary | ICD-10-CM | POA: Insufficient documentation

## 2023-10-06 DIAGNOSIS — G4733 Obstructive sleep apnea (adult) (pediatric): Secondary | ICD-10-CM

## 2023-10-06 DIAGNOSIS — M3214 Glomerular disease in systemic lupus erythematosus: Secondary | ICD-10-CM | POA: Insufficient documentation

## 2023-10-06 DIAGNOSIS — D631 Anemia in chronic kidney disease: Secondary | ICD-10-CM | POA: Diagnosis not present

## 2023-10-06 DIAGNOSIS — I1311 Hypertensive heart and chronic kidney disease without heart failure, with stage 5 chronic kidney disease, or end stage renal disease: Secondary | ICD-10-CM | POA: Diagnosis not present

## 2023-10-06 HISTORY — PX: TENCKHOFF CATHETER INSERTION: SHX5251

## 2023-10-06 LAB — POCT I-STAT, CHEM 8
BUN: 25 mg/dL — ABNORMAL HIGH (ref 8–23)
Calcium, Ion: 0.98 mmol/L — ABNORMAL LOW (ref 1.15–1.40)
Chloride: 104 mmol/L (ref 98–111)
Creatinine, Ser: 5.8 mg/dL — ABNORMAL HIGH (ref 0.44–1.00)
Glucose, Bld: 86 mg/dL (ref 70–99)
HCT: 38 % (ref 36.0–46.0)
Hemoglobin: 12.9 g/dL (ref 12.0–15.0)
Potassium: 4.1 mmol/L (ref 3.5–5.1)
Sodium: 137 mmol/L (ref 135–145)
TCO2: 25 mmol/L (ref 22–32)

## 2023-10-06 SURGERY — INSERTION, CATHETER, DIALYSIS, PERITONEAL
Anesthesia: General

## 2023-10-06 MED ORDER — LIDOCAINE HCL (PF) 1 % IJ SOLN
INTRAMUSCULAR | Status: AC
  Filled 2023-10-06: qty 30

## 2023-10-06 MED ORDER — ACETAMINOPHEN 10 MG/ML IV SOLN
INTRAVENOUS | Status: AC
  Filled 2023-10-06: qty 100

## 2023-10-06 MED ORDER — PROPOFOL 10 MG/ML IV BOLUS
INTRAVENOUS | Status: DC | PRN
  Administered 2023-10-06: 90 mg via INTRAVENOUS

## 2023-10-06 MED ORDER — 0.9 % SODIUM CHLORIDE (POUR BTL) OPTIME
TOPICAL | Status: DC | PRN
  Administered 2023-10-06: 1000 mL

## 2023-10-06 MED ORDER — OXYCODONE HCL 5 MG PO TABS
ORAL_TABLET | ORAL | Status: AC
Start: 2023-10-06 — End: 2023-10-06
  Filled 2023-10-06: qty 1

## 2023-10-06 MED ORDER — LIDOCAINE HCL (PF) 1 % IJ SOLN
INTRAMUSCULAR | Status: DC | PRN
Start: 2023-10-06 — End: 2023-10-06
  Administered 2023-10-06: 10 mL

## 2023-10-06 MED ORDER — ROCURONIUM BROMIDE 10 MG/ML (PF) SYRINGE
PREFILLED_SYRINGE | INTRAVENOUS | Status: DC | PRN
  Administered 2023-10-06: 50 mg via INTRAVENOUS

## 2023-10-06 MED ORDER — OXYCODONE HCL 5 MG PO TABS
5.0000 mg | ORAL_TABLET | Freq: Once | ORAL | Status: AC | PRN
  Administered 2023-10-06: 5 mg via ORAL

## 2023-10-06 MED ORDER — SODIUM CHLORIDE 0.9 % IV SOLN: INTRAVENOUS | Status: DC

## 2023-10-06 MED ORDER — SODIUM CHLORIDE 0.9 % IR SOLN
Status: DC | PRN
  Administered 2023-10-06: 1

## 2023-10-06 MED ORDER — ONDANSETRON HCL 4 MG/2ML IJ SOLN
INTRAMUSCULAR | Status: DC | PRN
  Administered 2023-10-06: 4 mg via INTRAVENOUS

## 2023-10-06 MED ORDER — FENTANYL CITRATE (PF) 100 MCG/2ML IJ SOLN
INTRAMUSCULAR | Status: AC
  Filled 2023-10-06: qty 2

## 2023-10-06 MED ORDER — MIDAZOLAM HCL 2 MG/2ML IJ SOLN
INTRAMUSCULAR | Status: AC
  Filled 2023-10-06: qty 2

## 2023-10-06 MED ORDER — ONDANSETRON HCL 4 MG/2ML IJ SOLN
INTRAMUSCULAR | Status: AC
  Filled 2023-10-06: qty 2

## 2023-10-06 MED ORDER — OXYCODONE HCL 5 MG/5ML PO SOLN: 5.0000 mg | Freq: Once | ORAL | Status: AC | PRN

## 2023-10-06 MED ORDER — ALBUMIN HUMAN 5 % IV SOLN: INTRAVENOUS | Status: DC | PRN

## 2023-10-06 MED ORDER — ROCURONIUM BROMIDE 10 MG/ML (PF) SYRINGE
PREFILLED_SYRINGE | INTRAVENOUS | Status: AC
  Filled 2023-10-06: qty 10

## 2023-10-06 MED ORDER — CHLORHEXIDINE GLUCONATE 0.12 % MT SOLN
15.0000 mL | Freq: Once | OROMUCOSAL | Status: AC
  Administered 2023-10-06: 15 mL via OROMUCOSAL
  Filled 2023-10-06: qty 15

## 2023-10-06 MED ORDER — ACETAMINOPHEN 10 MG/ML IV SOLN
1000.0000 mg | Freq: Once | INTRAVENOUS | Status: DC | PRN
  Administered 2023-10-06: 1000 mg via INTRAVENOUS

## 2023-10-06 MED ORDER — DEXAMETHASONE SODIUM PHOSPHATE 10 MG/ML IJ SOLN
INTRAMUSCULAR | Status: AC
  Filled 2023-10-06: qty 1

## 2023-10-06 MED ORDER — DEXAMETHASONE SODIUM PHOSPHATE 10 MG/ML IJ SOLN
INTRAMUSCULAR | Status: DC | PRN
  Administered 2023-10-06: 5 mg via INTRAVENOUS

## 2023-10-06 MED ORDER — PHENYLEPHRINE HCL-NACL 20-0.9 MG/250ML-% IV SOLN
INTRAVENOUS | Status: DC | PRN
  Administered 2023-10-06: 40 ug/min via INTRAVENOUS

## 2023-10-06 MED ORDER — PROPOFOL 10 MG/ML IV BOLUS
INTRAVENOUS | Status: AC
  Filled 2023-10-06: qty 20

## 2023-10-06 MED ORDER — TRAMADOL HCL 50 MG PO TABS
50.0000 mg | ORAL_TABLET | Freq: Four times a day (QID) | ORAL | 0 refills | Status: DC | PRN
  Filled 2023-10-06: qty 8, 2d supply, fill #0

## 2023-10-06 MED ORDER — CEFAZOLIN SODIUM-DEXTROSE 2-4 GM/100ML-% IV SOLN
2.0000 g | INTRAVENOUS | Status: AC
  Administered 2023-10-06: 2 g via INTRAVENOUS
  Filled 2023-10-06: qty 100

## 2023-10-06 MED ORDER — CHLORHEXIDINE GLUCONATE 4 % EX SOLN: 60.0000 mL | Freq: Once | CUTANEOUS | Status: DC

## 2023-10-06 MED ORDER — MIDAZOLAM HCL 2 MG/2ML IJ SOLN
INTRAMUSCULAR | Status: DC | PRN
  Administered 2023-10-06: 2 mg via INTRAVENOUS

## 2023-10-06 MED ORDER — SUGAMMADEX SODIUM 200 MG/2ML IV SOLN
INTRAVENOUS | Status: DC | PRN
  Administered 2023-10-06 (×2): 100 mg via INTRAVENOUS

## 2023-10-06 MED ORDER — FENTANYL CITRATE (PF) 100 MCG/2ML IJ SOLN
25.0000 ug | INTRAMUSCULAR | Status: DC | PRN
  Administered 2023-10-06 (×3): 25 ug via INTRAVENOUS

## 2023-10-06 MED ORDER — FENTANYL CITRATE (PF) 250 MCG/5ML IJ SOLN
INTRAMUSCULAR | Status: AC
  Filled 2023-10-06: qty 5

## 2023-10-06 MED ORDER — ORAL CARE MOUTH RINSE: 15.0000 mL | Freq: Once | OROMUCOSAL | Status: AC

## 2023-10-06 MED ORDER — LIDOCAINE 2% (20 MG/ML) 5 ML SYRINGE
INTRAMUSCULAR | Status: AC
  Filled 2023-10-06: qty 5

## 2023-10-06 MED ORDER — LIDOCAINE 2% (20 MG/ML) 5 ML SYRINGE
INTRAMUSCULAR | Status: DC | PRN
  Administered 2023-10-06: 100 mg via INTRAVENOUS

## 2023-10-06 MED ORDER — FENTANYL CITRATE (PF) 250 MCG/5ML IJ SOLN
INTRAMUSCULAR | Status: DC | PRN
  Administered 2023-10-06 (×2): 50 ug via INTRAVENOUS

## 2023-10-06 MED ORDER — DROPERIDOL 2.5 MG/ML IJ SOLN: 0.6250 mg | Freq: Once | INTRAMUSCULAR | Status: DC | PRN

## 2023-10-06 SURGICAL SUPPLY — 48 items
ADAPTER TITANIUM MEDIONICS (MISCELLANEOUS) ×1 IMPLANT
BAG DECANTER FOR FLEXI CONT (MISCELLANEOUS) ×1 IMPLANT
BIOPATCH RED 1 DISK 7.0 (GAUZE/BANDAGES/DRESSINGS) ×1 IMPLANT
BLADE CLIPPER SURG (BLADE) IMPLANT
BLADE SURG 11 STRL SS (BLADE) ×1 IMPLANT
CATH EXTENDED DIALYSIS (CATHETERS) IMPLANT
CHLORAPREP W/TINT 26 (MISCELLANEOUS) ×1 IMPLANT
CLIP APPLIE 5 13 M/L LIGAMAX5 (MISCELLANEOUS) IMPLANT
COVER SURGICAL LIGHT HANDLE (MISCELLANEOUS) ×1 IMPLANT
DERMABOND ADVANCED .7 DNX12 (GAUZE/BANDAGES/DRESSINGS) ×1 IMPLANT
DEVICE TROCAR PUNCTURE CLOSURE (ENDOMECHANICALS) ×1 IMPLANT
DRSG TEGADERM 4X4.75 (GAUZE/BANDAGES/DRESSINGS) ×3 IMPLANT
ELECTRODE REM PT RTRN 9FT ADLT (ELECTROSURGICAL) ×1 IMPLANT
GAUZE SPONGE 4X4 12PLY STRL (GAUZE/BANDAGES/DRESSINGS) ×1 IMPLANT
GLOVE INDICATOR 6.5 STRL GRN (GLOVE) ×1 IMPLANT
GLOVE SURG UNDER LTX SZ7.5 (GLOVE) ×1 IMPLANT
GOWN STRL REUS W/ TWL LRG LVL3 (GOWN DISPOSABLE) ×2 IMPLANT
GOWN STRL REUS W/ TWL XL LVL3 (GOWN DISPOSABLE) ×1 IMPLANT
GRASPER SUT TROCAR 14GX15 (MISCELLANEOUS) ×1 IMPLANT
IRRIGATION SUCT STRKRFLW 2 WTP (MISCELLANEOUS) IMPLANT
IV NS 1000ML BAXH (IV SOLUTION) ×1 IMPLANT
KIT BASIN OR (CUSTOM PROCEDURE TRAY) ×1 IMPLANT
KIT TURNOVER KIT B (KITS) ×1 IMPLANT
NDL INSUFFLATION 14GA 120MM (NEEDLE) ×1 IMPLANT
NEEDLE INSUFFLATION 14GA 120MM (NEEDLE) ×1 IMPLANT
NS IRRIG 1000ML POUR BTL (IV SOLUTION) ×1 IMPLANT
PAD ARMBOARD POSITIONER FOAM (MISCELLANEOUS) ×2 IMPLANT
POWDER SURGICEL 3.0 GRAM (HEMOSTASIS) IMPLANT
SCISSORS LAP 5X35 DISP (ENDOMECHANICALS) IMPLANT
SET CYSTO W/LG BORE CLAMP LF (SET/KITS/TRAYS/PACK) ×1 IMPLANT
SET EXT 12IN DIALYSIS STAY-SAF (MISCELLANEOUS) ×1 IMPLANT
SET TUBE SMOKE EVAC HIGH FLOW (TUBING) ×1 IMPLANT
SLEEVE Z-THREAD 5X100MM (TROCAR) ×2 IMPLANT
SPIKE FLUID TRANSFER (MISCELLANEOUS) ×1 IMPLANT
STYLET FALLER (MISCELLANEOUS) ×1 IMPLANT
STYLET FALLER MEDIONICS (MISCELLANEOUS) ×1 IMPLANT
SUT MNCRL AB 4-0 PS2 18 (SUTURE) ×1 IMPLANT
SUT PROLENE 0 SH 30 (SUTURE) ×2 IMPLANT
SUT SILK 0 TIES 10X30 (SUTURE) ×1 IMPLANT
TAPE CLOTH SURG 4X10 WHT LF (GAUZE/BANDAGES/DRESSINGS) IMPLANT
TOWEL GREEN STERILE (TOWEL DISPOSABLE) ×1 IMPLANT
TOWEL GREEN STERILE FF (TOWEL DISPOSABLE) ×1 IMPLANT
TRAY LAPAROSCOPIC MC (CUSTOM PROCEDURE TRAY) ×1 IMPLANT
TROCAR 11X100 Z THREAD (TROCAR) IMPLANT
TROCAR 5MMX150MM (TROCAR) ×1 IMPLANT
TROCAR XCEL NON-BLD 5MMX100MML (ENDOMECHANICALS) ×1 IMPLANT
TROCAR Z-THREAD OPTICAL 5X100M (TROCAR) ×1 IMPLANT
WATER STERILE IRR 1000ML POUR (IV SOLUTION) ×1 IMPLANT

## 2023-10-06 NOTE — H&P (Signed)
 HPI:   Colleen Obrien is a 68 y.o. female with history of end-stage on dialysis via tunneled dialysis catheter since February.  She has been evaluated in the past and was thought to require AV graft and thus no AV fistula was ever placed and she has never had upper extremity access.  She is here today to discuss peritoneal dialysis access.  She has 2 previous C-sections no other abdominal surgeries.  She is accompanied by her husband today.  She dialyzes Tuesdays, Thursdays and Saturdays and does not take any blood thinners.       Past Medical History:  Diagnosis Date   Anemia in chronic kidney disease (CKD)     Arthritis     Asthma     Blood dyscrasia      systemic lupus   Chronic renal disease, stage 5, glomerular filtration rate less than or equal to 15 mL/min/1.73 square meter (HCC)     Dyspnea     GERD (gastroesophageal reflux disease)     Gout     Hypertension     Lupus     Narcolepsy     Osteoporosis 05/05/2022   Pneumonia      x 1   Sleep apnea      tries to use CPAP nightly as of 04/29/22   Walker as Set designer dependent      when going outside home             Family History  Problem Relation Age of Onset   Hypertension Mother     Diabetes Mother     Dementia Father     Hypertension Father     Hyperlipidemia Father     Diabetes Paternal Grandmother     Alcohol abuse Paternal Grandfather               Past Surgical History:  Procedure Laterality Date   CESAREAN SECTION        x 2   COLONOSCOPY       DIALYSIS/PERMA CATHETER INSERTION N/A 03/07/2023    Procedure: DIALYSIS/PERMA CATHETER INSERTION;  Surgeon: Norine Manuelita LABOR, MD;  [facility];  Service: Cardiovascular;  Laterality: N/A;   ganglion cyst removed Right      arm   TIBIA IM NAIL INSERTION Right 05/03/2022    Procedure: INTRAMEDULLARY (IM) NAIL TIBIAL;  Surgeon: Celena Sharper, MD;  [facility];  Service: Orthopedics;  Laterality: Right;    TOTAL HIP ARTHROPLASTY Left 08/26/2019    Procedure: LEFT TOTAL HIP ARTHROPLASTY ANTERIOR APPROACH;  Surgeon: Jerri Kay HERO, MD;  [facility];  Service: Orthopedics;  Laterality: Left;   TUBAL LIGATION       TUNNELLED CATHETER EXCHANGE N/A 07/03/2023    Procedure: TUNNELLED CATHETER EXCHANGE;  Surgeon: Magda Debby SAILOR, MD;  Location: [facility];  Service: Cardiovascular;  Laterality: N/A;          Short Social History:  Social History        Tobacco Use   Smoking status: Never   Smokeless tobacco: Never  Substance Use Topics   Alcohol use: No      Allergies       Allergies  Allergen Reactions   Erythromycin Nausea And Vomiting   Levofloxacin        Causes muscle pain    Sulfa Antibiotics Hives              Current Outpatient Medications  Medication Sig Dispense Refill   acetaminophen  (TYLENOL ) 500 MG tablet Take 500 mg by mouth every 6 (six) hours as needed (for pain.).       albuterol  (PROVENTIL  HFA;VENTOLIN  HFA) 108 (90 BASE) MCG/ACT inhaler Inhale 2 puffs into the lungs every 6 (six) hours as needed for wheezing or shortness of breath.       allopurinol  (ZYLOPRIM ) 300 MG tablet Take 300 mg by mouth daily.       budesonide -formoterol  (SYMBICORT) 80-4.5 MCG/ACT inhaler Inhale 2 puffs into the lungs 2 (two) times daily as needed (asthma).       cyanocobalamin 1000 MCG tablet Take 1,000 mcg by mouth daily.       felodipine  (PLENDIL ) 10 MG 24 hr tablet Take 10 mg by mouth daily.       ferrous sulfate 325 (65 FE) MG tablet Take 325 mg by mouth every other day.       furosemide  (LASIX ) 40 MG tablet Take 1 tablet (40 mg total) by mouth daily as needed (weight gain of 3 lbs in 1 day or 5 lbs over 1 week.).       hydroxychloroquine  (PLAQUENIL ) 200 MG tablet Take 200 mg by mouth daily.        labetalol  (NORMODYNE ) 300 MG tablet Take 300 mg by mouth 2 (two) times daily.       methocarbamol  (ROBAXIN ) 500 MG tablet Take 0.5 tablets (250 mg total) by mouth every 8 (eight) hours as  needed for muscle spasms. 10 tablet 0   pantoprazole  (PROTONIX ) 40 MG tablet Take 40 mg by mouth at bedtime as needed (acid reflux).          No current facility-administered medications for this visit.        Review of Systems  Constitutional:  Constitutional negative. HENT: HENT negative.  Eyes: Eyes negative.  Respiratory: Respiratory negative.  Cardiovascular: Cardiovascular negative.  GI: Gastrointestinal negative.  Musculoskeletal: Positive for joint pain.  Skin: Skin negative.  Neurological: Positive for numbness and syncope.  Hematologic: Hematologic/lymphatic negative.  Psychiatric: Psychiatric negative.          Objective:    Vitals:   10/06/23 0553  BP: (!) 151/98  Pulse: 77  Resp: 18  Temp: 98.3 F (36.8 C)  SpO2: 97%      Physical Exam HENT:     Head: Normocephalic.     Nose: Nose normal.     Mouth/Throat:     Mouth: Mucous membranes are moist.    Eyes:     Pupils: Pupils are equal, round, and reactive to light.    Pulmonary:     Effort: Pulmonary effort is normal.  Abdominal:     General: Abdomen is flat.     Palpations: Abdomen is soft.     Comments: Previous C-section incision well-healed    Musculoskeletal:        General: Normal range of motion.    Skin:    General: Skin is warm.     Capillary Refill: Capillary refill takes less than 2 seconds.    Neurological:     General: No focal deficit present.     Mental Status: She is alert.    Psychiatric:        Mood and Affect: Mood normal.        Thought Content: Thought content normal.        Judgment: Judgment normal.         Assessment/Plan:    68 year old female with end-stage renal disease  on dialysis via catheter.  She is here today for peritoneal dialysis catheter placement.  We again discussed the risk benefits alternatives and mostly focused on injury to intra-abdominal organs and primary nonfunction of the catheter up to and requiring removal versus revision of the  catheter.  Her and her husband demonstrate good understanding and agree to proceed.    Vincenzo Stave C. Sheree, MD [facility] Office: [phone] Pager: [phone]

## 2023-10-06 NOTE — Op Note (Signed)
    Patient name: Colleen Obrien MRN: [id number] DOB: [birth year] Sex: female  10/06/2023 Pre-operative Diagnosis: End-stage renal disease Post-operative diagnosis:  Same Surgeon:  Penne C. Sheree, MD Assistant: Lucie Apt, PA Procedure Performed: Laparoscopic placement continuous ambulatory peritoneal dialysis catheter  Indications: 68 year old female with end-stage renal disease currently dialyzing via hemodialysis with IJ tunneled dialysis catheter.  We have discussed proceeding with peritoneal dialysis catheter placement including the risk and benefits and she demonstrates good understanding and agrees to proceed.  Experience assistant was necessary to facilitate laparoscopic maneuvering and placement of the catheter including tunneling through the rectus sheath and tunneling the catheter through the left lateral upper quadrant  Findings: There was only 1 adhesion in the abdomen this was taken down to facilitate ease of placement of the catheter.  The rectum and uterus appeared to be densely adherent and the catheter was placed anterior to the uterus.  At completion 800 cc of saline was instilled and 600 cc briskly returned.   Procedure:  The patient was identified in the holding area and taken to the operating room where she was placed supine operative table and general anesthesia was induced.  She was sterilely prepped and draped in the abdomen in the usual fashion, antibiotics were ministered and a timeout was called.  A small skin incision was made 2 cm off of the left costal margin and a Veress needle was used to gain access into the abdomen and saline was easily flushed.  She was then connected to low flow CO2 and subsequently high flow and the abdomen was insufflated.  In the right upper quadrant 2 cm from the costal margin a 5 mm incision was created and using an Optiview trocar and a 0 degree camera we were able to gain access directly into the abdomen.  Using a 30 degree  camera we then evaluated the abdomen there were no obvious injuries from Veress needle access in the Veress needle was removed and insufflation was connected to the Optiview port.  A second port was placed with direct visualization 1 handbreadth in the right upper quadrant.  We then evaluated the abdomen and the patient was placed in Trendelenburg position.  There was 1 adhesion which was taken down sharply.  The rectum and uterus of appeared to be densely adherent.  There was no real omentum that would require omentopexy.  A transverse incision was created near the umbilicus and a long 5 mm port was then tunneled overlying the peritoneum and entered the peritoneum in the pelvis.  The catheter was then placed through this port anterior to the uterus.  Catheter was then fully assembled and tunneled cephalad towards the costal margin on the left and then directly out of the skin in the left upper quadrant laterally.  The abdomen was desufflated after inspecting for placement of the catheter in the pelvis which all appeared satisfactory.  The patient was placed in reverse Trendelenburg and 800 cc of saline was quickly instilled and briskly 600 returned which had only slight blood tinge.  Satisfied with this all of the trocars were removed.  Incisions were closed with 4-0 Monocryl and Dermabond placed at the skin level.  The patient was awakened from anesthesia having tolerated procedure well immediate complication.  All counts were correct at completion.  EBL: 20 cc   Charls Custer C. Sheree, MD [facility] Office: [phone] Pager: [phone]

## 2023-10-06 NOTE — Anesthesia Postprocedure Evaluation (Signed)
 Anesthesia Post Note  Patient: Colleen Obrien  Procedure(s) Performed: INSERTION, CATHETER, DIALYSIS, PERITONEAL     Patient location during evaluation: PACU Anesthesia Type: General Level of consciousness: awake and alert Pain management: pain level controlled Vital Signs Assessment: post-procedure vital signs reviewed and stable Respiratory status: spontaneous breathing, nonlabored ventilation, respiratory function stable and patient connected to nasal cannula oxygen Cardiovascular status: blood pressure returned to baseline and stable Postop Assessment: no apparent nausea or vomiting Anesthetic complications: no   No notable events documented.  Last Vitals:  Vitals:   10/06/23 0945 10/06/23 0946  BP: (!) 130/90   Pulse: 68   Resp: 19   Temp: 36.4 C 36.4 C  SpO2: 94%     Last Pain:  Vitals:   10/06/23 1002  TempSrc:   PainSc: 4                  Franky JONETTA Bald

## 2023-10-06 NOTE — Anesthesia Procedure Notes (Signed)
 Procedure Name: Intubation Date/Time: 10/06/2023 7:48 AM  Performed by: Felecia Stanfill A, CRNAPre-anesthesia Checklist: Patient identified, Emergency Drugs available, Suction available and Patient being monitored Patient Re-evaluated:Patient Re-evaluated prior to induction Oxygen Delivery Method: Circle System Utilized Preoxygenation: Pre-oxygenation with 100% oxygen Induction Type: IV induction Ventilation: Mask ventilation without difficulty Laryngoscope Size: Mac and 3 Grade View: Grade II Tube type: Oral Tube size: 7.5 mm Number of attempts: 1 Airway Equipment and Method: Stylet and Oral airway Placement Confirmation: ETT inserted through vocal cords under direct vision, positive ETCO2 and breath sounds checked- equal and bilateral Secured at: 22 cm Tube secured with: Tape Dental Injury: Teeth and Oropharynx as per pre-operative assessment

## 2023-10-06 NOTE — Discharge Instructions (Signed)
 Surgery to Put in a Peritoneal Dialysis Catheter: What to Know After After surgery to put in a peritoneal dialysis (PD) catheter, it's common to have some pain or discomfort in your belly at the area of your cuts from surgery. You may need to wait 2 weeks after your surgery before you can start PD treatment. If you need dialysis before that time, your health care provider may begin PD treatment early or offer blood dialysis treatments, called hemodialysis, until you heal. Follow these instructions at home: Incision care  Take care of your cut areas as told. Make sure you: Wash your hands with soap and water  for at least 20 seconds before and after you change your bandage. If you can't use soap and water , use hand sanitizer. Change your bandage. Leave stitches or skin glue alone. Leave tape strips alone unless you're told to take them off. You may trim the edges of the tape strips if they curl up. Check the area around your cuts every day for signs of infection. Check for: Redness, swelling, or pain. Fluid or blood. Warmth. Pus or a bad smell. Medicines Take medicines only as told. If you were given antibiotics, take or use them as told. Do not stop using them even if you start to feel better. Ask your provider if it's safe to drive or use machines while taking your medicine. Driving Do not drive or ride in a car until your provider says it's OK. Your seat belt could move the catheter out of position or cause irritation by rubbing on your cut. Activity Rest and limit your activity. Ask if it's OK for you to lift. Ask what things are safe for you to do at home. Ask when you can go back to work or school. General instructions Do not smoke, vape, or use nicotine or tobacco. Doing this can slow down healing. Eat and drink as told. Do not take baths, swim, or use a hot tub until you're told it's OK. Ask if you can shower. Wear loose-fitting clothes that keep the catheter covered so it can't  get caught on something. Keep your catheter and the skin near it clean at all times. Contact a health care provider if: You have a fever or chills. You have signs of infection around a cut. You throw up each time you eat or drink. Get help right away if: You have problems breathing. You have severe pain in your belly that doesn't get better with medicine. You are confused. You have bright red blood in your poop, or your poop is dark black and looks like tar. These symptoms may be an emergency. Call 911 right away. Do not wait to see if the symptoms will go away. Do not drive yourself to the hospital.

## 2023-10-06 NOTE — Transfer of Care (Signed)
 Immediate Anesthesia Transfer of Care Note  Patient: Colleen Obrien  Procedure(s) Performed: INSERTION, CATHETER, DIALYSIS, PERITONEAL  Patient Location: PACU  Anesthesia Type:General  Level of Consciousness: awake, alert , and oriented  Airway & Oxygen Therapy: Patient Spontanous Breathing  Post-op Assessment: Report given to RN and Post -op Vital signs reviewed and stable  Post vital signs: Reviewed and stable  Last Vitals:  Vitals Value Taken Time  BP 128/88 10/06/23 09:15  Temp    Pulse 73 10/06/23 09:16  Resp 13 10/06/23 09:16  SpO2 92 % 10/06/23 09:16  Vitals shown include unfiled device data.  Last Pain:  Vitals:   10/06/23 0553  TempSrc: Oral  PainSc: 3       Patients Stated Pain Goal: 0 (10/06/23 0553)  Complications: No notable events documented.

## 2023-10-07 DIAGNOSIS — N2581 Secondary hyperparathyroidism of renal origin: Secondary | ICD-10-CM | POA: Diagnosis not present

## 2023-10-07 DIAGNOSIS — Z992 Dependence on renal dialysis: Secondary | ICD-10-CM | POA: Diagnosis not present

## 2023-10-07 DIAGNOSIS — N186 End stage renal disease: Secondary | ICD-10-CM | POA: Diagnosis not present

## 2023-10-09 ENCOUNTER — Encounter (HOSPITAL_COMMUNITY): Payer: Self-pay | Admitting: Vascular Surgery

## 2023-10-10 DIAGNOSIS — Z992 Dependence on renal dialysis: Secondary | ICD-10-CM | POA: Diagnosis not present

## 2023-10-10 DIAGNOSIS — N186 End stage renal disease: Secondary | ICD-10-CM | POA: Diagnosis not present

## 2023-10-10 DIAGNOSIS — N2581 Secondary hyperparathyroidism of renal origin: Secondary | ICD-10-CM | POA: Diagnosis not present

## 2023-10-12 DIAGNOSIS — N2581 Secondary hyperparathyroidism of renal origin: Secondary | ICD-10-CM | POA: Diagnosis not present

## 2023-10-12 DIAGNOSIS — Z992 Dependence on renal dialysis: Secondary | ICD-10-CM | POA: Diagnosis not present

## 2023-10-12 DIAGNOSIS — N186 End stage renal disease: Secondary | ICD-10-CM | POA: Diagnosis not present

## 2023-10-14 DIAGNOSIS — N2581 Secondary hyperparathyroidism of renal origin: Secondary | ICD-10-CM | POA: Diagnosis not present

## 2023-10-14 DIAGNOSIS — N186 End stage renal disease: Secondary | ICD-10-CM | POA: Diagnosis not present

## 2023-10-14 DIAGNOSIS — Z992 Dependence on renal dialysis: Secondary | ICD-10-CM | POA: Diagnosis not present

## 2023-10-17 DIAGNOSIS — N2581 Secondary hyperparathyroidism of renal origin: Secondary | ICD-10-CM | POA: Diagnosis not present

## 2023-10-17 DIAGNOSIS — Z992 Dependence on renal dialysis: Secondary | ICD-10-CM | POA: Diagnosis not present

## 2023-10-17 DIAGNOSIS — N186 End stage renal disease: Secondary | ICD-10-CM | POA: Diagnosis not present

## 2023-10-19 DIAGNOSIS — N2581 Secondary hyperparathyroidism of renal origin: Secondary | ICD-10-CM | POA: Diagnosis not present

## 2023-10-19 DIAGNOSIS — Z992 Dependence on renal dialysis: Secondary | ICD-10-CM | POA: Diagnosis not present

## 2023-10-19 DIAGNOSIS — N186 End stage renal disease: Secondary | ICD-10-CM | POA: Diagnosis not present

## 2023-10-21 DIAGNOSIS — N2581 Secondary hyperparathyroidism of renal origin: Secondary | ICD-10-CM | POA: Diagnosis not present

## 2023-10-21 DIAGNOSIS — Z992 Dependence on renal dialysis: Secondary | ICD-10-CM | POA: Diagnosis not present

## 2023-10-21 DIAGNOSIS — N186 End stage renal disease: Secondary | ICD-10-CM | POA: Diagnosis not present

## 2023-10-23 DIAGNOSIS — N2581 Secondary hyperparathyroidism of renal origin: Secondary | ICD-10-CM | POA: Diagnosis not present

## 2023-10-23 DIAGNOSIS — N186 End stage renal disease: Secondary | ICD-10-CM | POA: Diagnosis not present

## 2023-10-23 DIAGNOSIS — Z992 Dependence on renal dialysis: Secondary | ICD-10-CM | POA: Diagnosis not present

## 2023-10-24 DIAGNOSIS — N186 End stage renal disease: Secondary | ICD-10-CM | POA: Diagnosis not present

## 2023-10-24 DIAGNOSIS — Z992 Dependence on renal dialysis: Secondary | ICD-10-CM | POA: Diagnosis not present

## 2023-10-24 DIAGNOSIS — N2581 Secondary hyperparathyroidism of renal origin: Secondary | ICD-10-CM | POA: Diagnosis not present

## 2023-10-25 DIAGNOSIS — Z01818 Encounter for other preprocedural examination: Secondary | ICD-10-CM | POA: Diagnosis not present

## 2023-10-25 DIAGNOSIS — Z01810 Encounter for preprocedural cardiovascular examination: Secondary | ICD-10-CM | POA: Diagnosis not present

## 2023-10-25 DIAGNOSIS — N186 End stage renal disease: Secondary | ICD-10-CM | POA: Diagnosis not present

## 2023-10-25 DIAGNOSIS — I071 Rheumatic tricuspid insufficiency: Secondary | ICD-10-CM | POA: Diagnosis not present

## 2023-10-25 DIAGNOSIS — N2581 Secondary hyperparathyroidism of renal origin: Secondary | ICD-10-CM | POA: Diagnosis not present

## 2023-10-25 DIAGNOSIS — Z992 Dependence on renal dialysis: Secondary | ICD-10-CM | POA: Diagnosis not present

## 2023-10-26 DIAGNOSIS — N186 End stage renal disease: Secondary | ICD-10-CM | POA: Diagnosis not present

## 2023-10-26 DIAGNOSIS — N2581 Secondary hyperparathyroidism of renal origin: Secondary | ICD-10-CM | POA: Diagnosis not present

## 2023-10-26 DIAGNOSIS — Z992 Dependence on renal dialysis: Secondary | ICD-10-CM | POA: Diagnosis not present

## 2023-10-27 DIAGNOSIS — N186 End stage renal disease: Secondary | ICD-10-CM | POA: Diagnosis not present

## 2023-10-27 DIAGNOSIS — Z992 Dependence on renal dialysis: Secondary | ICD-10-CM | POA: Diagnosis not present

## 2023-10-27 DIAGNOSIS — N2581 Secondary hyperparathyroidism of renal origin: Secondary | ICD-10-CM | POA: Diagnosis not present

## 2023-10-31 DIAGNOSIS — Z992 Dependence on renal dialysis: Secondary | ICD-10-CM | POA: Diagnosis not present

## 2023-10-31 DIAGNOSIS — Z8701 Personal history of pneumonia (recurrent): Secondary | ICD-10-CM | POA: Diagnosis not present

## 2023-10-31 DIAGNOSIS — N186 End stage renal disease: Secondary | ICD-10-CM | POA: Diagnosis not present

## 2023-10-31 DIAGNOSIS — N185 Chronic kidney disease, stage 5: Secondary | ICD-10-CM | POA: Diagnosis not present

## 2023-10-31 DIAGNOSIS — M3214 Glomerular disease in systemic lupus erythematosus: Secondary | ICD-10-CM | POA: Diagnosis not present

## 2023-10-31 DIAGNOSIS — M810 Age-related osteoporosis without current pathological fracture: Secondary | ICD-10-CM | POA: Diagnosis not present

## 2023-10-31 DIAGNOSIS — I120 Hypertensive chronic kidney disease with stage 5 chronic kidney disease or end stage renal disease: Secondary | ICD-10-CM | POA: Diagnosis not present

## 2023-11-08 DIAGNOSIS — N186 End stage renal disease: Secondary | ICD-10-CM | POA: Diagnosis not present

## 2023-11-08 DIAGNOSIS — Z992 Dependence on renal dialysis: Secondary | ICD-10-CM | POA: Diagnosis not present

## 2023-11-08 DIAGNOSIS — N2581 Secondary hyperparathyroidism of renal origin: Secondary | ICD-10-CM | POA: Diagnosis not present

## 2023-11-09 DIAGNOSIS — N2581 Secondary hyperparathyroidism of renal origin: Secondary | ICD-10-CM | POA: Diagnosis not present

## 2023-11-09 DIAGNOSIS — Z992 Dependence on renal dialysis: Secondary | ICD-10-CM | POA: Diagnosis not present

## 2023-11-11 DIAGNOSIS — N186 End stage renal disease: Secondary | ICD-10-CM | POA: Diagnosis not present

## 2023-11-11 DIAGNOSIS — N2581 Secondary hyperparathyroidism of renal origin: Secondary | ICD-10-CM | POA: Diagnosis not present

## 2023-11-11 DIAGNOSIS — Z992 Dependence on renal dialysis: Secondary | ICD-10-CM | POA: Diagnosis not present

## 2023-11-13 DIAGNOSIS — N186 End stage renal disease: Secondary | ICD-10-CM | POA: Diagnosis not present

## 2023-11-13 DIAGNOSIS — Z992 Dependence on renal dialysis: Secondary | ICD-10-CM | POA: Diagnosis not present

## 2023-11-14 DIAGNOSIS — Z992 Dependence on renal dialysis: Secondary | ICD-10-CM | POA: Diagnosis not present

## 2023-11-14 DIAGNOSIS — N186 End stage renal disease: Secondary | ICD-10-CM | POA: Diagnosis not present

## 2023-11-15 DIAGNOSIS — N2581 Secondary hyperparathyroidism of renal origin: Secondary | ICD-10-CM | POA: Diagnosis not present

## 2023-11-15 DIAGNOSIS — M25562 Pain in left knee: Secondary | ICD-10-CM | POA: Diagnosis not present

## 2023-11-15 DIAGNOSIS — Z992 Dependence on renal dialysis: Secondary | ICD-10-CM | POA: Diagnosis not present

## 2023-11-15 DIAGNOSIS — N186 End stage renal disease: Secondary | ICD-10-CM | POA: Diagnosis not present

## 2023-11-15 DIAGNOSIS — M84361K Stress fracture, right tibia, subsequent encounter for fracture with nonunion: Secondary | ICD-10-CM | POA: Diagnosis not present

## 2023-11-19 DIAGNOSIS — Z992 Dependence on renal dialysis: Secondary | ICD-10-CM | POA: Diagnosis not present

## 2023-11-19 DIAGNOSIS — N2581 Secondary hyperparathyroidism of renal origin: Secondary | ICD-10-CM | POA: Diagnosis not present

## 2023-11-20 DIAGNOSIS — N2581 Secondary hyperparathyroidism of renal origin: Secondary | ICD-10-CM | POA: Diagnosis not present

## 2023-11-20 DIAGNOSIS — Z992 Dependence on renal dialysis: Secondary | ICD-10-CM | POA: Diagnosis not present

## 2023-11-21 DIAGNOSIS — J4520 Mild intermittent asthma, uncomplicated: Secondary | ICD-10-CM | POA: Diagnosis not present

## 2023-11-21 DIAGNOSIS — E669 Obesity, unspecified: Secondary | ICD-10-CM | POA: Diagnosis not present

## 2023-11-21 DIAGNOSIS — Z992 Dependence on renal dialysis: Secondary | ICD-10-CM | POA: Diagnosis not present

## 2023-11-21 DIAGNOSIS — M810 Age-related osteoporosis without current pathological fracture: Secondary | ICD-10-CM | POA: Diagnosis not present

## 2023-11-21 DIAGNOSIS — K219 Gastro-esophageal reflux disease without esophagitis: Secondary | ICD-10-CM | POA: Diagnosis not present

## 2023-11-21 DIAGNOSIS — M3214 Glomerular disease in systemic lupus erythematosus: Secondary | ICD-10-CM | POA: Diagnosis not present

## 2023-11-21 DIAGNOSIS — I10 Essential (primary) hypertension: Secondary | ICD-10-CM | POA: Diagnosis not present

## 2023-11-21 DIAGNOSIS — M3210 Systemic lupus erythematosus, organ or system involvement unspecified: Secondary | ICD-10-CM | POA: Diagnosis not present

## 2023-11-21 DIAGNOSIS — G4733 Obstructive sleep apnea (adult) (pediatric): Secondary | ICD-10-CM | POA: Diagnosis not present

## 2023-11-22 DIAGNOSIS — N186 End stage renal disease: Secondary | ICD-10-CM | POA: Diagnosis not present

## 2023-11-22 DIAGNOSIS — Z992 Dependence on renal dialysis: Secondary | ICD-10-CM | POA: Diagnosis not present

## 2023-11-22 DIAGNOSIS — N2581 Secondary hyperparathyroidism of renal origin: Secondary | ICD-10-CM | POA: Diagnosis not present

## 2023-11-23 DIAGNOSIS — Z992 Dependence on renal dialysis: Secondary | ICD-10-CM | POA: Diagnosis not present

## 2023-11-23 DIAGNOSIS — N186 End stage renal disease: Secondary | ICD-10-CM | POA: Diagnosis not present

## 2023-11-24 DIAGNOSIS — N186 End stage renal disease: Secondary | ICD-10-CM | POA: Diagnosis not present

## 2023-11-24 DIAGNOSIS — N2581 Secondary hyperparathyroidism of renal origin: Secondary | ICD-10-CM | POA: Diagnosis not present

## 2023-11-24 DIAGNOSIS — Z992 Dependence on renal dialysis: Secondary | ICD-10-CM | POA: Diagnosis not present

## 2023-11-26 DIAGNOSIS — Z992 Dependence on renal dialysis: Secondary | ICD-10-CM | POA: Diagnosis not present

## 2023-11-26 DIAGNOSIS — N186 End stage renal disease: Secondary | ICD-10-CM | POA: Diagnosis not present

## 2023-11-27 DIAGNOSIS — N2581 Secondary hyperparathyroidism of renal origin: Secondary | ICD-10-CM | POA: Diagnosis not present

## 2023-11-27 DIAGNOSIS — Z992 Dependence on renal dialysis: Secondary | ICD-10-CM | POA: Diagnosis not present

## 2023-11-27 DIAGNOSIS — N186 End stage renal disease: Secondary | ICD-10-CM | POA: Diagnosis not present

## 2023-11-28 DIAGNOSIS — N186 End stage renal disease: Secondary | ICD-10-CM | POA: Diagnosis not present

## 2023-11-28 DIAGNOSIS — N2581 Secondary hyperparathyroidism of renal origin: Secondary | ICD-10-CM | POA: Diagnosis not present

## 2023-11-28 DIAGNOSIS — Z992 Dependence on renal dialysis: Secondary | ICD-10-CM | POA: Diagnosis not present

## 2023-11-29 DIAGNOSIS — N186 End stage renal disease: Secondary | ICD-10-CM | POA: Diagnosis not present

## 2023-11-29 DIAGNOSIS — M79671 Pain in right foot: Secondary | ICD-10-CM | POA: Diagnosis not present

## 2023-11-29 DIAGNOSIS — W19XXXA Unspecified fall, initial encounter: Secondary | ICD-10-CM | POA: Diagnosis not present

## 2023-11-29 DIAGNOSIS — M79604 Pain in right leg: Secondary | ICD-10-CM | POA: Diagnosis not present

## 2023-11-29 DIAGNOSIS — S8991XA Unspecified injury of right lower leg, initial encounter: Secondary | ICD-10-CM | POA: Diagnosis not present

## 2023-11-29 DIAGNOSIS — Z992 Dependence on renal dialysis: Secondary | ICD-10-CM | POA: Diagnosis not present

## 2023-11-29 DIAGNOSIS — N2581 Secondary hyperparathyroidism of renal origin: Secondary | ICD-10-CM | POA: Diagnosis not present

## 2023-11-30 DIAGNOSIS — N186 End stage renal disease: Secondary | ICD-10-CM | POA: Diagnosis not present

## 2023-12-01 DIAGNOSIS — M3214 Glomerular disease in systemic lupus erythematosus: Secondary | ICD-10-CM | POA: Diagnosis not present

## 2023-12-01 DIAGNOSIS — Z992 Dependence on renal dialysis: Secondary | ICD-10-CM | POA: Diagnosis not present

## 2023-12-01 DIAGNOSIS — N2581 Secondary hyperparathyroidism of renal origin: Secondary | ICD-10-CM | POA: Diagnosis not present

## 2023-12-01 DIAGNOSIS — M810 Age-related osteoporosis without current pathological fracture: Secondary | ICD-10-CM | POA: Diagnosis not present

## 2023-12-01 DIAGNOSIS — N186 End stage renal disease: Secondary | ICD-10-CM | POA: Diagnosis not present

## 2023-12-01 DIAGNOSIS — Z8701 Personal history of pneumonia (recurrent): Secondary | ICD-10-CM | POA: Diagnosis not present

## 2023-12-01 DIAGNOSIS — N185 Chronic kidney disease, stage 5: Secondary | ICD-10-CM | POA: Diagnosis not present

## 2023-12-02 DIAGNOSIS — N2581 Secondary hyperparathyroidism of renal origin: Secondary | ICD-10-CM | POA: Diagnosis not present

## 2023-12-02 DIAGNOSIS — Z992 Dependence on renal dialysis: Secondary | ICD-10-CM | POA: Diagnosis not present

## 2023-12-04 ENCOUNTER — Encounter: Payer: Self-pay | Admitting: Radiology

## 2023-12-04 DIAGNOSIS — N186 End stage renal disease: Secondary | ICD-10-CM | POA: Diagnosis not present

## 2023-12-05 ENCOUNTER — Other Ambulatory Visit (HOSPITAL_COMMUNITY): Payer: Self-pay | Admitting: Nephrology

## 2023-12-05 DIAGNOSIS — N186 End stage renal disease: Secondary | ICD-10-CM

## 2023-12-06 ENCOUNTER — Ambulatory Visit (HOSPITAL_COMMUNITY)
Admission: RE | Admit: 2023-12-06 | Discharge: 2023-12-06 | Disposition: A | Discharge status: Home / Self Care | Source: Ambulatory Visit | Attending: Nephrology | Admitting: Nephrology

## 2023-12-06 DIAGNOSIS — Z4901 Encounter for fitting and adjustment of extracorporeal dialysis catheter: Secondary | ICD-10-CM | POA: Diagnosis not present

## 2023-12-06 DIAGNOSIS — N186 End stage renal disease: Secondary | ICD-10-CM | POA: Diagnosis not present

## 2023-12-06 HISTORY — PX: IR REMOVAL TUN CV CATH W/O FL: IMG2289

## 2023-12-06 MED ORDER — LIDOCAINE-EPINEPHRINE 1 %-1:100000 IJ SOLN
20.0000 mL | Freq: Once | INTRAMUSCULAR | Status: AC
  Administered 2023-12-06: 10 mL via INTRADERMAL

## 2023-12-06 MED ORDER — LIDOCAINE-EPINEPHRINE 1 %-1:100000 IJ SOLN
INTRAMUSCULAR | Status: AC
  Filled 2023-12-06: qty 1

## 2023-12-06 NOTE — Procedures (Signed)
 Interventional Radiology Procedure Note  PROCEDURE SUMMARY:  Successful removal of tunneled catheter.  No complications.   EBL = trace  Please see full dictation in imaging section of Epic for procedure details.  Electronically Signed: Carlin DELENA Griffon, PA-C 12/06/2023, 10:12 AM

## 2023-12-09 DIAGNOSIS — N186 End stage renal disease: Secondary | ICD-10-CM | POA: Diagnosis not present

## 2023-12-10 DIAGNOSIS — N2581 Secondary hyperparathyroidism of renal origin: Secondary | ICD-10-CM | POA: Diagnosis not present

## 2023-12-10 DIAGNOSIS — Z992 Dependence on renal dialysis: Secondary | ICD-10-CM | POA: Diagnosis not present

## 2023-12-12 DIAGNOSIS — N2581 Secondary hyperparathyroidism of renal origin: Secondary | ICD-10-CM | POA: Diagnosis not present

## 2023-12-12 DIAGNOSIS — Z9889 Other specified postprocedural states: Secondary | ICD-10-CM | POA: Diagnosis not present

## 2023-12-12 DIAGNOSIS — Z9089 Acquired absence of other organs: Secondary | ICD-10-CM | POA: Diagnosis not present

## 2023-12-13 ENCOUNTER — Other Ambulatory Visit (HOSPITAL_COMMUNITY): Payer: Self-pay | Admitting: Surgery

## 2023-12-13 DIAGNOSIS — E213 Hyperparathyroidism, unspecified: Secondary | ICD-10-CM

## 2023-12-13 DIAGNOSIS — N186 End stage renal disease: Secondary | ICD-10-CM | POA: Diagnosis not present

## 2023-12-16 DIAGNOSIS — N186 End stage renal disease: Secondary | ICD-10-CM | POA: Diagnosis not present

## 2023-12-20 DIAGNOSIS — Z992 Dependence on renal dialysis: Secondary | ICD-10-CM | POA: Diagnosis not present

## 2023-12-20 DIAGNOSIS — N186 End stage renal disease: Secondary | ICD-10-CM | POA: Diagnosis not present

## 2023-12-20 DIAGNOSIS — M84361K Stress fracture, right tibia, subsequent encounter for fracture with nonunion: Secondary | ICD-10-CM | POA: Diagnosis not present

## 2023-12-20 DIAGNOSIS — N2581 Secondary hyperparathyroidism of renal origin: Secondary | ICD-10-CM | POA: Diagnosis not present

## 2023-12-24 DIAGNOSIS — Z992 Dependence on renal dialysis: Secondary | ICD-10-CM | POA: Diagnosis not present

## 2023-12-25 DIAGNOSIS — N2581 Secondary hyperparathyroidism of renal origin: Secondary | ICD-10-CM | POA: Diagnosis not present

## 2023-12-25 DIAGNOSIS — Z992 Dependence on renal dialysis: Secondary | ICD-10-CM | POA: Diagnosis not present

## 2023-12-26 DIAGNOSIS — N2581 Secondary hyperparathyroidism of renal origin: Secondary | ICD-10-CM | POA: Diagnosis not present

## 2023-12-28 DIAGNOSIS — N186 End stage renal disease: Secondary | ICD-10-CM | POA: Diagnosis not present

## 2023-12-29 DIAGNOSIS — N186 End stage renal disease: Secondary | ICD-10-CM | POA: Diagnosis not present

## 2023-12-29 DIAGNOSIS — Z992 Dependence on renal dialysis: Secondary | ICD-10-CM | POA: Diagnosis not present

## 2023-12-29 DIAGNOSIS — N2581 Secondary hyperparathyroidism of renal origin: Secondary | ICD-10-CM | POA: Diagnosis not present

## 2023-12-31 DIAGNOSIS — Z992 Dependence on renal dialysis: Secondary | ICD-10-CM | POA: Diagnosis not present

## 2023-12-31 DIAGNOSIS — N2581 Secondary hyperparathyroidism of renal origin: Secondary | ICD-10-CM | POA: Diagnosis not present

## 2023-12-31 DIAGNOSIS — I120 Hypertensive chronic kidney disease with stage 5 chronic kidney disease or end stage renal disease: Secondary | ICD-10-CM | POA: Diagnosis not present

## 2023-12-31 DIAGNOSIS — N186 End stage renal disease: Secondary | ICD-10-CM | POA: Diagnosis not present

## 2024-01-01 ENCOUNTER — Encounter (HOSPITAL_COMMUNITY)
Admission: RE | Admit: 2024-01-01 | Discharge: 2024-01-01 | Disposition: A | Source: Ambulatory Visit | Attending: Surgery | Admitting: Surgery

## 2024-01-01 ENCOUNTER — Ambulatory Visit (HOSPITAL_COMMUNITY)
Admission: RE | Admit: 2024-01-01 | Discharge: 2024-01-01 | Disposition: A | Source: Ambulatory Visit | Attending: Surgery | Admitting: Surgery

## 2024-01-01 ENCOUNTER — Encounter (HOSPITAL_COMMUNITY)
Admission: RE | Admit: 2024-01-01 | Discharge: 2024-01-01 | Disposition: A | Discharge status: Home / Self Care | Source: Ambulatory Visit | Attending: Surgery | Admitting: Surgery

## 2024-01-01 ENCOUNTER — Encounter (HOSPITAL_COMMUNITY): Payer: Self-pay

## 2024-01-01 DIAGNOSIS — E213 Hyperparathyroidism, unspecified: Secondary | ICD-10-CM | POA: Insufficient documentation

## 2024-01-01 MED ORDER — IOHEXOL 350 MG/ML SOLN
75.0000 mL | Freq: Once | INTRAVENOUS | Status: AC | PRN
  Administered 2024-01-01: 75 mL via INTRAVENOUS

## 2024-01-01 MED ORDER — TECHNETIUM TC 99M SESTAMIBI - CARDIOLITE
25.0000 | Freq: Once | INTRAVENOUS | Status: AC
  Administered 2024-01-01: 26.2 via INTRAVENOUS

## 2024-01-16 ENCOUNTER — Ambulatory Visit: Payer: Self-pay | Admitting: Surgery

## 2024-01-16 ENCOUNTER — Ambulatory Visit: Admitting: Orthopaedic Surgery

## 2024-01-16 ENCOUNTER — Ambulatory Visit: Admitting: Physician Assistant

## 2024-01-16 ENCOUNTER — Other Ambulatory Visit: Payer: Self-pay

## 2024-01-16 DIAGNOSIS — M25552 Pain in left hip: Secondary | ICD-10-CM

## 2024-01-16 MED ORDER — LIDOCAINE HCL 1 % IJ SOLN
3.0000 mL | INTRAMUSCULAR | Status: AC | PRN
  Administered 2024-01-16: 18:00:00 3 mL

## 2024-01-16 MED ORDER — METHYLPREDNISOLONE ACETATE 40 MG/ML IJ SUSP
40.0000 mg | INTRAMUSCULAR | Status: AC | PRN
  Administered 2024-01-16: 18:00:00 40 mg via INTRA_ARTICULAR

## 2024-01-16 MED ORDER — BUPIVACAINE HCL 0.5 % IJ SOLN
3.0000 mL | INTRAMUSCULAR | Status: AC | PRN
  Administered 2024-01-16: 18:00:00 3 mL via INTRA_ARTICULAR

## 2024-01-16 NOTE — Progress Notes (Signed)
 Office Visit Note   Patient: Colleen Obrien           Date of Birth: 11/19/1955           MRN: [id number] Visit Date: 01/16/2024              Requested by: Claudene Pellet, MD [phone] [address],  [place] [address] PCP: Claudene Pellet, MD   Assessment & Plan: Visit Diagnoses:  1. Pain in left hip     Plan: History of Present Illness Colleen Obrien is a 68 year old female with a history of left hip replacement and dialysis who presents with left hip pain.  Her left hip pain started last Thursday. It is severe, disrupts sleep, and radiates into the groin, with a sensation that the hip is out of socket. She now uses a wheelchair due to marked difficulty walking.  She has been on peritoneal dialysis since July, after hemodialysis that began in February. 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 She also reports prior bursitis and a leg length discrepancy with a shorter right leg that affects her gait and back.  Physical Exam MUSCULOSKELETAL: Tenderness on palpation of the trochanteric region.    Assessment and Plan Left trochanteric bursitis Acute left hip pain likely due to bursitis or tendinitis. Deconditioning may contribute. - Administer trochanteric bursa injection for pain relief. - Encouraged physical therapy to improve mobility and strength.  Follow-Up Instructions: No follow-ups on file.   Orders:  Orders Placed This Encounter  Procedures   XR HIP UNILAT W OR W/O PELVIS 2-3 VIEWS LEFT   No orders of the defined types were placed in this encounter.     Procedures: Large Joint Inj: L greater trochanter on 01/16/2024 5:38 PM Indications: pain Details: 22 G needle Medications: 3 mL lidocaine  1 %; 3 mL bupivacaine  0.5 %; 40 mg methylPREDNISolone  acetate 40 MG/ML Outcome: tolerated well, no immediate complications Patient was prepped and draped in the usual  sterile fashion.       Clinical Data: No additional findings.   Subjective: Chief Complaint  Patient presents with   Left Hip - Pain    HPI  Review of Systems  Constitutional: Negative.   HENT: Negative.    Eyes: Negative.   Respiratory: Negative.    Cardiovascular: Negative.   Endocrine: Negative.   Musculoskeletal: Negative.   Neurological: Negative.   Hematological: Negative.   Psychiatric/Behavioral: Negative.    All other systems reviewed and are negative.    Objective: Vital Signs: There were no vitals taken for this visit.  Physical Exam Vitals and nursing note reviewed.  Constitutional:      Appearance: She is well-developed.  HENT:     Head: Atraumatic.     Nose: Nose normal.  Eyes:     Extraocular Movements: Extraocular movements intact.  Cardiovascular:     Pulses: Normal pulses.  Pulmonary:     Effort: Pulmonary effort is normal.  Abdominal:     Palpations: Abdomen is soft.  Musculoskeletal:     Cervical back: Neck supple.  Skin:    General: Skin is warm.     Capillary Refill: Capillary refill takes less than 2 seconds.  Neurological:     Mental Status: She is alert. Mental status is at baseline.  Psychiatric:        Behavior: Behavior normal.        Thought Content: Thought content normal.  Judgment: Judgment normal.     Ortho Exam  Specialty Comments:  No specialty comments available.  Imaging: XR HIP UNILAT W OR W/O PELVIS 2-3 VIEWS LEFT Result Date: 01/16/2024 X-rays of the pelvis and the left hip show a left total hip arthroplasty with ingrown components.  There is slight degenerative spurring around the greater trochanter.    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 05/03/2022   Anemia of chronic renal failure 03/08/2021   Chronic renal disease, stage IV (HCC) 03/08/2021   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   AVN (avascular necrosis of bone) (HCC) 07/08/2019   Diverticulosis of colon without hemorrhage 07/08/2019   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   Tremor 03/02/2017   Shaking 08/23/2016   Excessive daytime sleepiness 08/23/2016   Obstructive sleep apnea 08/23/2016   Past Medical History:  Diagnosis Date   Anemia in chronic kidney disease (CKD)    Arthritis    Asthma    Blood dyscrasia    systemic lupus   Chronic renal disease, stage 5, glomerular filtration rate less than or equal to 15 mL/min/1.73 square meter (HCC)    Dyspnea    End stage renal disease on dialysis (HCC)    T, TH, S   GERD (gastroesophageal reflux disease)    Gout    Hypertension    Lupus    Narcolepsy    Osteoporosis 05/05/2022   Pneumonia    x 1   Sleep apnea    tries to use CPAP nightly as of 04/29/22   Walker as Engineer, Water dependent    when going outside home    Family History  Problem Relation Age of Onset   Hypertension Mother    Diabetes Mother    Dementia Father    Hypertension Father    Hyperlipidemia Father    Diabetes Paternal Grandmother    Alcohol abuse Paternal Grandfather     Past Surgical History:  Procedure Laterality Date   CESAREAN SECTION     x 2   COLONOSCOPY     DIALYSIS/PERMA CATHETER INSERTION N/A 03/07/2023   Procedure: DIALYSIS/PERMA CATHETER INSERTION;  Surgeon: Norine Manuelita LABOR, MD;  Location: [facility];  Service: Cardiovascular;  Laterality: N/A;   ganglion cyst removed Right    arm   IR REMOVAL TUN CV  CATH W/O FL  12/06/2023   PARATHYROIDECTOMY N/A 08/18/2023   Procedure: PARATHYROIDECTOMY;  Surgeon: Eletha Boas, MD;  Location: [facility] OR;  Service: General;  Laterality: N/A;  NECK EXPLORATION WITH PARATHYROIDECTOMY POSSIBLE EXCISION THYROID  NODULE   TENCKHOFF CATHETER INSERTION N/A 10/06/2023   Procedure: INSERTION, CATHETER, DIALYSIS, PERITONEAL;  Surgeon: Sheree Penne Bruckner, MD;  Location: [facility] OR;  Service: Vascular;  Laterality: N/A;   TIBIA IM NAIL INSERTION Right 05/03/2022   Procedure: INTRAMEDULLARY (IM) NAIL TIBIAL;  Surgeon: Celena Sharper, MD;  Location: [facility] OR;  Service: Orthopedics;  Laterality: Right;   TOTAL HIP ARTHROPLASTY Left 08/26/2019  Procedure: LEFT TOTAL HIP ARTHROPLASTY ANTERIOR APPROACH;  Surgeon: Jerri Kay HERO, MD;  Location: [facility] OR;  Service: Orthopedics;  Laterality: Left;   TUBAL LIGATION     TUNNELLED CATHETER EXCHANGE N/A 07/03/2023   Procedure: TUNNELLED CATHETER EXCHANGE;  Surgeon: Magda Debby SAILOR, MD;  Location: [facility];  Service: Cardiovascular;  Laterality: N/A;   TUNNELLED CATHETER EXCHANGE N/A 07/28/2023   Procedure: TUNNELLED CATHETER EXCHANGE;  Surgeon: Gretta Lonni PARAS, MD;  Location: [facility];  Service: Cardiovascular;  Laterality: N/A;   Social History   Occupational History   Occupation: [employer]  Tobacco Use   Smoking status: Never   Smokeless tobacco: Never  Vaping Use   Vaping status: Never Used  Substance and Sexual Activity   Alcohol use: No   Drug use: No   Sexual activity: Not on file

## 2024-01-16 NOTE — Progress Notes (Signed)
 Telephone call to patient to discuss results of recent diagnostic imaging.  Nuclear medicine parathyroid  scan with sestamibi dated January 05, 2024 showed no recurrent parathyroid  adenoma within the neck or superior mediastinum.  A 4D CT scan of the neck and chest also failed to identify any evidence of persistent or recurrent parathyroid  adenoma.  Patient had undergone neck exploration in July 2025.  At that time she was found to have a large right superior parathyroid  gland measuring over 3 cm in size and weighing 5582 mg.  The remaining parathyroid  glands were identified and biopsied.  Patient now has intact PTH levels exceeding 2200.  Her clinical picture is that of secondary hyperparathyroidism.  Patient has orthopedic issues which are awaiting resolution of her parathyroid  disease prior to surgical intervention.  As I am going to be out of the office and out of the operating room on medical leave for a few months, I have asked the patient if she would consider referral to [facility] for consultation in the endocrine surgery division.  She has agreed.  Patient is already being evaluated at [facility] for renal transplantation.  We will place a referral to the endocrine surgery division for consultation for this nice patient.  If possible she will proceed with parathyroid  surgery at [facility] sometime this winter or early spring.  We will follow her progress and be happy to see her back in my practice at any time.  [name], MD [facility] Surgery Office: [phone]

## 2024-01-25 NOTE — Progress Notes (Addendum)
 [facility]  Pre-Transplant ANNUAL  NON-KIDNEY TRANSPLANT PROVIDERS Claudene Alberta Kingsley, MD [facility] OF VISIT [facility]   CHIEF COMPLAINT Kidney Transplant Annual Visit  Does patient consent to the use of [employer]bridge AI to help generate visit note?  [x] YES         NO[]             IDENTIFICATION Ms. Colleen Obrien is a 68 y.o. female?with past medical history of hypertension, gout, history of narcolepsy/cataplexy, SLE, intermittent asthma, right proximal tibia fracture with rod placement in April 2024, end-stage renal disease currently on peritoneal dialysis who presents to our clinic for an annual visit regarding their candidacy for kidney transplantation. Ms. Colleen Obrien is currently INACTIVE on the transplant list with  months of wait time.  Ms. Colleen Obrien is blood type O+.  I last saw the patient in November 2024.  At that time she was not on dialysis and presented in a wheelchair.  She had previously undergone titanium rod placement after right leg fracture in 2023 with subsequent diagnosis and surgery for correction in April 2024.  She was having severe swelling in her right knee which was also limiting.  After seeing the patient I reached out to her home nephrologist in regards to initiation onto dialysis as she appeared fluid overloaded with noted lower extremity edema and fine Rales on exam.  She was then subsequently initiated onto dialysis in February 2025.  She returns today for scheduled follow-up.  She was seen by abdominal transplant surgery in May 2025 and was able to participate with PT. she underwent a 6-minute walk in January 2025 but was only able to ambulate 723 feet and was noted to have perceived dyspnea that was moderate.  She then underwent a another walk test in May 2025 and was able to do transfers at her 1000 feet.  History of Present Illness Colleen Obrien is a 68 year old female with chronic kidney disease on peritoneal dialysis who presents  for nephrology follow-up.  She started peritoneal dialysis in February and has been using a cycler for eight hours nightly for the past two to three months. She does not leave fluid in during the day and has not experienced any infections related to the dialysis. She has lost nearly twenty pounds of fluid weight since starting dialysis. Her blood pressure has been stable, ranging from 120/80 to 130/80. She produces approximately 600 mL of urine daily.  She has a history of a right leg fracture that did not heal properly.  She was referred to orthopedics for reevaluation and possible repeat pending.  Her orthopedic surgeon updated a PTX and it was noted to be greater than 1000.  She underwent a parathyroidectomy to remove a large parathyroid  gland, but her calcium  and PTH levels have not normalized completely. Her calcium  level is currently at nine. She is awaiting further evaluation at [facility] for elevated parathyroid  levels, despite normal nuclear and CT scan results.  She reports her orthopedic is extremely hesitant to attempt at repair due to her severe bone disease related to CKD. Her phosphorus levels have been running high, around seven.  She uses a rolling walker for mobility and can walk only very short distances with assistance, usually around her home.SABRA Her right leg remains bowed and painful.  Her current medications include Sensipar 30 mg daily (though she has been sent 90 mg), allopurinol  100 mg daily, furosemide  80 mg daily, Plaquenil  200 mg daily, labetalol  200 mg twice daily, and  as-needed medications such as albuterol  inhaler, Symbicort, Colcrys , Robaxin , Zofran , and Protonix . She takes two Auryxia  pills with each meal for phosphorus control, though her phosphorus level is around seven. She is no longer taking apixaban .    Cardiovascular        Exercise tolerance: Poor.  She does not complete any exertion activity and is limited to walking short distances around her house at home.          Cardiac history: No prior history of MI/CVA/arrhythmia or congestive heart failure.        Last cardiac studies  Echo: We are updating echocardiogram today.  Stress test: This was last updated in July 2024 with no evidence of ischemia or infarct but did show global myocardial blood flow reserve abnormal suggestive of microvascular dysfunction.  Heart catheterization: None prior.        Vascular                       Claudication signs or symptoms: Unable to discern as patient does not ambulate any great distance.                       Vascular studies: No formal lower extremity vascular studies completed.  Additional Medical History         History of malignancy: None prior.         History of stroke: None prior.         History of lung cancer: Lifelong non-smoker.         History of liver disease: No prior history of NASH or cirrhosis.         History of GI disease: No prior history of GERD but does have diverticulosis with colonoscopy.         History of blood disorders: Anemia of chronic disease.         Antiplatelet agents: None         Anticoagulation: None         Past Surgeries: PD catheter placement, rod placement status post right fem-tib fracture, left hip replacement, tubal ligation   Cancer Screening         Colonoscopy: This was last updated in December 2021 with diverticulosis only.         Pap smear: This was last updated in July 2024 and repeat in July 2027.         Mammogram: This was last updated in June 2024 and is due now.         CT abdomen/pelvis: This was last updated in July 2024 with repeat needed in July 2026 as CT [facility] she is on dialysis.         Low dose chest CT: Lifelong non-smoker  Functional Status        Patient can perform activities of daily living including dressing, eating, ambulating, toileting, and hygiene): Needs some assistance        Patient can perform instrumental activities of daily living including shopping,  housekeeping, money management, food preparation, and transportation: Needs assistance from husband.    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    Care Giving Plan: Presents today with husband who will her primary caretaker following kidney transplant.   REVIEW OF SYSTEMS A 12-point review of systems was performed with pertinent positives noted in the interval history above, otherwise negative review of systems.          IMMUNIZATIONS Immunization History  Administered  Date(s) Administered   COVID-19 Moderna Vaccine (1st,2nd,3rd dose = 0.53ml) 04/15/2019, 05/13/2019, 10/16/2019, 09/03/2020   COVID-19 vaccine (Moderna, BIVALENT) IM injection 50 mcg/0.5mL or 64mcg/0.25mL 07/20/2021   Covid-19 Vaccine 51mcg/0.5ml (>=74yrs) SPIKEVAX Moderna ([facility] Use Only) 01/13/2022   Flu Vaccine HD-IIV3 High Dose, IM PF(67yo+)(Fluzone) 12/20/2022   Flu Vaccine IIV3, IM PF (105mo+)(Fluarix, FluLaval, Fluzone) 12/26/2006   Influenza IIV4, IM pres-free 12/12/2013, 01/13/2022   Influenza, IM unspecified 11/05/2014, 11/07/2017, 10/01/2020   Influenza, recombinant (Egg-Free) IM PF (18 Yr+) (Flublok Quad) 11/12/2019   PNEUMOCOCCAL (PCV13) (BIRTH-10YR) VACCINE (PREVNAR 13) 12/19/2017   PNEUMOCOCCAL (PPSV23)(>=61YRS -OR- >=2 YRS WITH RISK) VACCINE (PNEUMOVAX 23) 06/28/2012, 01/08/2021   Polio, unspecified 08/15/2016, 02/24/2017, 08/28/2017, 03/06/2018, 10/25/2018, 07/15/2019   TDAP (>=22YR) VACCINE (ADACEL/BOOSTRIX) 03/20/2014    PAST MEDICAL HISTORY Past Medical History:  Diagnosis Date   Anemia due to chronic kidney disease    Asthma (HHS-HCC)    Essential hypertension    Gout, joint    History of blood transfusion    Narcolepsy and cataplexy (HHS-HCC)    SLE (systemic lupus erythematosus) (CMS/HHS-HCC)    Sleep apnea    Tibia fracture    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    PROBLEM LIST Problem List  Date Reviewed: 01/12/2024         ICD-10-CM Priority Class Noted - Resolved Diagnosed   Status post parathyroidectomy Z98.890, Z90.89   08/31/2023 - Present    Secondary hyperparathyroidism of renal origin (HHS-HCC) N25.81   03/02/2023 - Present    Anemia due to chronic kidney disease N18.9, D63.1   Unknown - Present    Pre-transplant evaluation for kidney transplant Z01.818   Unknown - Present    Hx of fracture of tibia Z87.81   Unknown - Present    Overview Signed 05/30/2022  9:36 AM by Delice Allean Figures, NP  s/p IMN R tibia on 05/03/22      Hypertension I10   04/09/2019 - Present    CKD (chronic kidney disease) stage 5, GFR less than 15 ml/min (CMS/HHS-HCC) N18.5   03/12/2018 - Present    Gout, joint M10.9   Unknown - Present    Sleep apnea G47.30   Unknown - Present    Narcolepsy (HHS-HCC) G47.419   Unknown - Present    SLE (systemic lupus erythematosus) (CMS/HHS-HCC) M32.9   Unknown - Present    Asthma (HHS-HCC) J45.909   Unknown - Present    Narcolepsy and cataplexy (HHS-HCC) G47.411   Unknown - Present    Excessive daytime sleepiness G47.19   08/23/2016 - Present    Osteoporosis M81.0   06/12/2012 - Present    RESOLVED: 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   04/09/2019 - 05/30/2022     PAST SURGICAL HISTORY Past Surgical History:  Procedure Laterality Date   IM NAILING TIBIA  05/03/2022   SIMPLE EXERCISE TEST(6 MIN WALK)  02/08/2023   CESAREAN SECTION     x 2   COLONOSCOPY     gangian cysyt     LAPAROSCOPIC TUBAL LIGATION     Left Hip replacemnt      SOCIAL HISTORY Social History   Tobacco Use   Smoking status: Never   Smokeless tobacco: Never  Vaping Use   Vaping status: Never Used  Substance Use Topics   Alcohol use: Never   Drug use: Never    FAMILY HISTORY Family History  Problem Relation Name Age of Onset   High blood pressure (Hypertension) Mother     Dementia  Father     High blood pressure (Hypertension) Father     No Known Problems Son      No Known Problems Son     Chronic kidney disease Neg Hx      CURRENT MEDICATIONS Outpatient Encounter Medications as of 01/12/2024  Medication Sig Dispense Refill   acetaminophen  (TYLENOL ) 325 MG tablet Take 325 mg by mouth every 6 (six) hours     albuterol  90 mcg/actuation inhaler Inhale into the lungs every 6 (six) hours as needed     allopurinoL  (ZYLOPRIM ) 100 MG tablet Take 100 mg by mouth once daily     budesonide -formoteroL  (SYMBICORT) 160-4.5 mcg/actuation inhaler Inhale into the lungs     cinacalcet (SENSIPAR) 30 MG tablet Take 30 mg by mouth once daily     colchicine  (COLCRYS ) 0.6 mg tablet Take 0.6 mg by mouth once daily as needed     DIALYVITE  1-100-300-50 mg-mg-mcg-mg Tab Take 1 tablet by mouth once daily     ferric citrate  (AURYXIA ) 210 mg iron  tablet Take 420 mg by mouth 3 (three) times daily with meals     FUROsemide  (LASIX ) 80 MG tablet Take 80 mg by mouth     hydroxychloroquine  (PLAQUENIL ) 200 mg tablet Take 200 mg by mouth once daily     labetaloL  (TRANDATE ) 100 MG tablet Take 1 tablet by mouth 2 (two) times daily     methocarbamoL  (ROBAXIN ) 500 MG tablet Take 500 mg by mouth 3 (three) times daily     methoxy peg-epoetin  beta (MIRCERA INJ) 60 mcg by IV Push route     ondansetron  (ZOFRAN ) 4 MG tablet Take 1 tablet by mouth     pantoprazole  sodium (PANTOPRAZOLE  ORAL) Take 20 mg by mouth        triamcinolone  0.1 % ointment APPLY TWICE A DAY X 2 WEEKS THEN HOLD     valACYclovir (VALTREX) 1000 MG tablet Take by mouth     apixaban  (ELIQUIS ) 2.5 mg tablet Take 2.5 mg by mouth every 12 (twelve) hours (Patient not taking: Reported on 12/20/2022)     denosumab  (PROLIA ) 60 mg/mL inj syringe Inject subcutaneously (Patient not taking: Reported on 12/20/2022)     dextroamphetamine -amphetamine  (ADDERALL) 5 mg tablet  (Patient not taking: Reported on 06/16/2023)     felodipine  (PLENDIL ) 5 MG ER tablet Take 5 mg by mouth once daily     ferrous sulfate 325 (65  FE) MG tablet Take 325 mg by mouth     fluticasone  propionate (FLONASE) 50 mcg/actuation nasal spray 1 spray by Each Nare route daily as needed. (Patient not taking: Reported on 06/16/2023)     hydrOXYzine  (ATARAX ) 50 MG tablet 1 tablet at bedtime as needed Orally Once a day; Duration: 30 days     sucroferric oxyhydroxide (VELPHORO ORAL) Take 1 tablet by mouth 3 (three) times daily with meals     [DISCONTINUED] cinacalcet (SENSIPAR) 90 MG tablet Take 120 mg by mouth once daily (Patient not taking: Reported on 01/12/2024)     No facility-administered encounter medications on file as of 01/12/2024.    ALLERGIES Allergies  Allergen Reactions   Levofloxacin Muscle Pain    Causes muscle pain  Causes muscle pain     Other Other (See Comments)    Nuts, causes sneezing and coughing   Sulfa (Sulfonamide Antibiotics) Hives   Sulfamethoxazole-Trimethoprim Unknown   Erythromycin Nausea   Latex Other (See Comments)    Skin redness    PHYSICAL EXAM Vitals:   01/12/24 0936  BP: 130/84  Pulse: 91  Resp: 16  Temp: 36.7 C (98 F)    General: African-American female who appears older than her stated age.  No acute distress. Head & Neck:  No JVD.   Lungs:  Full & clear A-P.   CV:  S1-S2, RRR. NO murmer or rubs noted.  Abdomen:  Soft, non tender, non distended, positive bowel sounds.  The allograft has no bruits or thrills.  No tenderness noted over allograft. Extremities: Right knee with edema. Skin:  There is no rash. Neuro: She is alert and oriented x 3.  She presented in a wheelchair today.  She was able to get onto the exam table from the wheelchair by standing and pivoting.   CONSULTATION AND COLLABORATION I have reviewed information from the referral, admission & discharge notes, last clinic notes, & other labs/studies.?  I have reviewed smoking history, BMI, age appropriate cancer screening, & immunization history.?   I have spoken with my transplant coordinator to review  the work up.  I have discussed the patient details with my child psychotherapist, automotive engineer.     LABORATORY REVIEW Ancillary Orders on 01/12/2024  Component Date Value Ref Range Status   Cytomegalovirus (CMV) Antibody, IGG 01/12/2024 Positive (!)  Negative Final   Cytomegalovirus (CMV) Antibody, IGM 01/12/2024 Negative  Negative Final   EBV Ab/VCA IgG 01/12/2024 Positive (!)  Negative Final   EBV Ab/VCA IgM 01/12/2024 Negative  Negative Final   EBV Ab/EBNA 01/12/2024 Positive (!)  Negative Final   EBV Ab/EA IgG 01/12/2024 Positive (!)  Negative Final   Hepatitis B Surface Antibody 01/12/2024 Negative  Negative Final   Hepatitis B Surface Antibody Quant 01/12/2024 <3.00  mIU/mL Final   Hepatitis B Surface Antigen 01/12/2024 NonReactive  NonReactive Final   Hepatitis C Virus Antibody 01/12/2024 Reactive, supplemental testing recommended (!)  NonReactive Final   HIV 1&2 Antibody/Antigen 01/12/2024 NonReactive  NonReactive Final   Hepatitis A Virus, Antibody 01/12/2024 Positive (!)  Negative Final   Varicella-Zoster Antibody, IGG 01/12/2024 Positive  Positive Final   Rubeola Antibodies, IgG 01/12/2024 >300.0  Immune >16.4 AU/mL Final                                    Negative        <13.5                                  Equivocal 13.5 - 16.4                                  Positive        >16.4 Presence of antibodies to Rubeola is presumptive evidence of immunity except when acute infection is suspected.   Mumps IgG Antibody 01/12/2024 >300.0  Immune >10.9 AU/mL Final                                   Negative         <9.0                                 Equivocal  9.0 - 10.9  Positive        >10.9 A positive result generally indicates past exposure to Mumps virus or previous vaccination.   Parathyroid  Hormone (PTH), Intact 01/12/2024 2,012 (H)  14 - 72 pg/mL Final   Albumin  01/12/2024 3.3 (L)  3.5 - 4.8 g/dL Final    CPRA CLASS I 87/87/7974 4.09   Final   SPEC CI AB 01/12/2024 Cw17 - SEE COMMENT   Final   HLA C17 AB MFI 01/12/2024 5182   Final   DILUTION 01/12/2024 1:1   Final   METHOD AB 01/12/2024 LUMINEX   Final   TEST DATE  AB 01/12/2024 [id number]   Final   SAMPLE DATE 01/12/2024 [id number]   Final   HLA COMMENTS 01/12/2024 Luminex SA Class I: SERUM EDTA TREATED.  THIS PATIENT'S SERUM EXHIBITS ADDITIONAL BACKGROUND REACTIVITY WHICH MAY HAVE INTERFERED WITH HLA ANTIBODY DETECTION.   Final   Disclaimer #2 01/12/2024    Final                   Value:LUMINEX SINGLE ANTIGEN BEAD ASSAY (LSAB) WAS PERFORMED ON THIS SERUM SAMPLE TO DETERMINE THE PRESENCE OF HLA ANTIBODIES AND THEIR SPECIFICITIES. IT IS AS COMPLETE AND DEFINITIVE AS CURRENT TECHNOLOGY PROVIDES AND TESTS FOR ALL HLA ANTIGENS FOR WHICH  REAGENTS ARE COMMERCIALLY AVAILABLE. THE CURRENT STANDARD CUTOFF IS MFI=1,000. THE ASSAY IS NOT A QUANTITATIVE TEST. THE DETECTED ANTIBODY BINDING INTENSITY (MFI VALUE) MAY BE INFLUENCED BY TECHNICAL VARIATIONS.  FDA DISCLAIMER: THE TEST PERFORMED HAS  NOT BEEN CLEARED OR APPROVED BY THE U.S. FDA. IT WAS VALIDATED AND ITS PERFORMANCE CHARACTERISTICS WERE DETERMINED BY THE CLINICAL TRANSPLANTATION IMMUNOLOGY LABORATORY AT [facility]. THIS LABORATORY IS CERTIFIED UNDER THE CLINICAL  LABORATORY IMPROVEMENT AMENDMENTS OF 1988 (CLIA) AS QUALIFIED TO PERFORM HIGH COMPLEXITY CLINICAL TESTING.   Hepatitis C RNA-PCR, Quant 01/12/2024 Not Detected  Not Detected Final   CPRA CLASS II 01/12/2024 0   Final   SPEC CII AB 01/12/2024 Negative   Final   DILUTION 01/12/2024 1:1   Final   METHOD AB 01/12/2024 LUMINEX   Final   TEST DATE  AB 01/12/2024 [id number]   Final   SAMPLE DATE 01/12/2024 [id number]   Final   HLA COMMENTS 01/12/2024 Luminex SA Class II: SERUM EDTA TREATED.  SPECIFICITY TEST WAS PERFORMED TO CONFIRM ANTIBODY SCREENING TEST (PRA). NO ANTIBODY SPECIFICITY ABOVE CUTOFF WAS  DETERMINED.   Final   Disclaimer #2 01/12/2024    Final                   Value:LUMINEX SINGLE ANTIGEN BEAD ASSAY (LSAB) WAS PERFORMED ON THIS SERUM SAMPLE TO DETERMINE THE PRESENCE OF HLA ANTIBODIES AND THEIR SPECIFICITIES. IT IS AS COMPLETE AND DEFINITIVE AS CURRENT TECHNOLOGY PROVIDES AND TESTS FOR ALL HLA ANTIGENS FOR WHICH  REAGENTS ARE COMMERCIALLY AVAILABLE. THE CURRENT STANDARD CUTOFF IS MFI=1,000. THE ASSAY IS NOT A QUANTITATIVE TEST. THE DETECTED ANTIBODY BINDING INTENSITY (MFI VALUE) MAY BE INFLUENCED BY TECHNICAL VARIATIONS.  FDA DISCLAIMER: THE TEST PERFORMED HAS  NOT BEEN CLEARED OR APPROVED BY THE U.S. FDA. IT WAS VALIDATED AND ITS PERFORMANCE CHARACTERISTICS WERE DETERMINED BY THE CLINICAL TRANSPLANTATION IMMUNOLOGY LABORATORY AT [facility]. THIS LABORATORY IS CERTIFIED UNDER THE CLINICAL  LABORATORY IMPROVEMENT AMENDMENTS OF 1988 (CLIA) AS QUALIFIED TO PERFORM HIGH COMPLEXITY CLINICAL TESTING.     ASSESSMENT AND PLAN Ms. Colleen Obrien is a 68 y.o. female is here to be considered for annual visit regarding their candidacy for kidney transplantation.  The  patient has been inactive on the transplant list since April 2024.  The initial reason for inactivation was due to decreased mobility with lower extremity edema.  She was extremely hesitant to be initiated onto dialysis requiring need to reach out to her nephrologist to help facilitate this.  It sounds as if when she was initiated onto dialysis she did improve and was able to complete a 6-minute walk test (over 1000 feet) in May 2025.  However today it appears that she is now having again issues with mobility related to her previous right tibial fracture.  She did undergo parathyroidectomy however continues to have ongoing issues with elevated PTH levels.  I have referred her to Dr. Margit with our oncology group for second opinion regarding her elevated PTH levels.   Patient Education and Counseling: Today I discussed the  benefits and risks of kidney transplant?for the treatment of advanced chronic kidney disease.? Our surgical colleagues have reviewed the surgical risks in more detail, but I did discuss the need for inpatient hospitalization, as well as surgical risks including?pain, bleeding, infections, and potential need for surgical drains.? I spend the majority of our discussion of: the importance of immune suppression to prevent transplant rejection;  the need for frequent lab monitoring, clinic visits, and potentially kidney transplant biopsies to monitor transplant function; and the side effects of immune suppression (increased risk of infections and cancers, high blood pressure, elevated cholesterol, neuropathies (headaches, tremors, altered cognition, worsening of mental health, sleep disturbances, etc.),?decreased blood cell production, GI disturbance (mostly diarrhea), and worsening or new diabetes).? We discussed the risks of not taking these medications, including: rejection, organ damage, severe enough to need dialysis; and several illness that could result in death.  I reviewed with the patient that a deceased donor transplant at [facility] may require short term dialysis post-transplant as well as informed them of the risk of primary non function.       We also discussed waiting times for a deceased donor organ & ways to shorten this, including: multi listing at other centers; high KDPI offers and the impacts on expected allograft half life; increased risk offers and the very low risk of acquiring HIV, hepatitis B or C;?and ?living donation. We discussed the shorter timeline to transplant and increased flexibility afforded by a living donor given current regional and national wait times, as well as the better?short- and long-term?outcomes. We also reviewed options for incompatible transplantation offered at [facility], including: paired donor exchange; desensitization (for HLA incompatibility); and ABO-incompatible  transplantation. With those in mind, I recommended that they have all interested donors contact us  regardless of blood type.  The induction therapy will be steroids if PRA<30% and thymoglobulin if PRA>30%         Sensitizing events include: Pregnancy, transfusion  Immunosuppression Plan: Standard  Organ Criteria  The patient is  a candidate for high KDPI offers. Ms. Colleen Obrien had previously consented to Hepatitis C Ab+/NAT+ donors and wishes to continue to be listed for these donors today.  The patient does not have immunity for hepatitis B donors.   INSTRUCTIONS The patient has been inactive on the transplant list since 2024 secondary to mobility issues.  It does sound that she improved slightly but is now having issues with severe bone disease as well as need for reconstruction of her right tibia rod placement to improve her mobility. I am not overly optimistic that the patient will proved to be a kidney transplant candidate.  This issue with her right leg has  been limiting her mobility for a number of years and I believe she has a relatively long course after repair with included physical therapy.  A kidney transplant will not improve this and may actually cause further issues. Patient should be presented to the nephrology provider and Wednesday afternoon meeting to determine if we should leave her on the transplant list or delist.  I would opt to delist with potential for re-referral once she has had surgery for her leg, however will defer to the transplant team for final decision.   FUTURE ORDERS AND APPOINTMENTS Future Orders Orders Placed This Encounter  Procedures   Parathyroid  Hormone (PTH)   Albumin      Future Appointments Future Appointments     Date/Time Provider Department Center Visit Type   03/14/2024 9:45 AM [facility] PET INJ 1N22 [facility]  [facility] PET CARDIAC INJ   03/14/2024 10:15 AM [facility] PET SR 2 [facility]  [facility] PET CARDIAC SCN       I personally  performed the service. The service was performed with a teaching provider immediately available.   I spent greater than 65 minutes in preparation for this clinic visit which includes extensive review of past and current recordst, visit with patient, development of plan of care and documentation.    Loretta J. Orlando ANP  Transplant Nephrology [phone]    PROVIDER SIGNATURE ROSELINE DUANA ORLANDO, NP  This note has been created using automated tools and reviewed for accuracy by LORETTA JANEL PHILLIPS.   I have made corrections as appropriate. I apologize for any I may have missed/overlooked.    Portions of this note may have been generated in part with voice recognition software and I apologize for any typographical errors that were not detected and corrected.

## 2024-02-11 ENCOUNTER — Inpatient Hospital Stay (HOSPITAL_COMMUNITY)
Admission: EM | Admit: 2024-02-11 | Discharge: 2024-02-15 | DRG: 314 | Disposition: A | Discharge status: Home / Self Care | Attending: Family Medicine | Admitting: Family Medicine

## 2024-02-11 ENCOUNTER — Encounter (HOSPITAL_COMMUNITY): Payer: Self-pay

## 2024-02-11 ENCOUNTER — Emergency Department (HOSPITAL_COMMUNITY)

## 2024-02-11 DIAGNOSIS — G4733 Obstructive sleep apnea (adult) (pediatric): Secondary | ICD-10-CM | POA: Diagnosis present

## 2024-02-11 DIAGNOSIS — E871 Hypo-osmolality and hyponatremia: Secondary | ICD-10-CM | POA: Diagnosis not present

## 2024-02-11 DIAGNOSIS — Z992 Dependence on renal dialysis: Secondary | ICD-10-CM

## 2024-02-11 DIAGNOSIS — Z91040 Latex allergy status: Secondary | ICD-10-CM

## 2024-02-11 DIAGNOSIS — Z8249 Family history of ischemic heart disease and other diseases of the circulatory system: Secondary | ICD-10-CM

## 2024-02-11 DIAGNOSIS — Z79899 Other long term (current) drug therapy: Secondary | ICD-10-CM

## 2024-02-11 DIAGNOSIS — N281 Cyst of kidney, acquired: Secondary | ICD-10-CM | POA: Diagnosis present

## 2024-02-11 DIAGNOSIS — Z993 Dependence on wheelchair: Secondary | ICD-10-CM

## 2024-02-11 DIAGNOSIS — Z7951 Long term (current) use of inhaled steroids: Secondary | ICD-10-CM | POA: Diagnosis not present

## 2024-02-11 DIAGNOSIS — G47419 Narcolepsy without cataplexy: Secondary | ICD-10-CM | POA: Diagnosis present

## 2024-02-11 DIAGNOSIS — M329 Systemic lupus erythematosus, unspecified: Secondary | ICD-10-CM | POA: Diagnosis present

## 2024-02-11 DIAGNOSIS — Z96642 Presence of left artificial hip joint: Secondary | ICD-10-CM | POA: Diagnosis present

## 2024-02-11 DIAGNOSIS — Z882 Allergy status to sulfonamides status: Secondary | ICD-10-CM

## 2024-02-11 DIAGNOSIS — I959 Hypotension, unspecified: Principal | ICD-10-CM | POA: Diagnosis present

## 2024-02-11 DIAGNOSIS — Z1152 Encounter for screening for COVID-19: Secondary | ICD-10-CM | POA: Diagnosis not present

## 2024-02-11 DIAGNOSIS — K219 Gastro-esophageal reflux disease without esophagitis: Secondary | ICD-10-CM | POA: Diagnosis present

## 2024-02-11 DIAGNOSIS — I120 Hypertensive chronic kidney disease with stage 5 chronic kidney disease or end stage renal disease: Secondary | ICD-10-CM | POA: Diagnosis present

## 2024-02-11 DIAGNOSIS — N186 End stage renal disease: Secondary | ICD-10-CM | POA: Diagnosis present

## 2024-02-11 DIAGNOSIS — J45909 Unspecified asthma, uncomplicated: Secondary | ICD-10-CM | POA: Diagnosis present

## 2024-02-11 DIAGNOSIS — Z881 Allergy status to other antibiotic agents status: Secondary | ICD-10-CM | POA: Diagnosis not present

## 2024-02-11 DIAGNOSIS — I10 Essential (primary) hypertension: Secondary | ICD-10-CM | POA: Diagnosis not present

## 2024-02-11 DIAGNOSIS — R531 Weakness: Secondary | ICD-10-CM | POA: Diagnosis present

## 2024-02-11 DIAGNOSIS — Z833 Family history of diabetes mellitus: Secondary | ICD-10-CM | POA: Diagnosis not present

## 2024-02-11 DIAGNOSIS — N2581 Secondary hyperparathyroidism of renal origin: Secondary | ICD-10-CM | POA: Diagnosis present

## 2024-02-11 DIAGNOSIS — D631 Anemia in chronic kidney disease: Secondary | ICD-10-CM | POA: Diagnosis present

## 2024-02-11 DIAGNOSIS — M109 Gout, unspecified: Secondary | ICD-10-CM | POA: Diagnosis present

## 2024-02-11 DIAGNOSIS — M546 Pain in thoracic spine: Secondary | ICD-10-CM

## 2024-02-11 DIAGNOSIS — Z83438 Family history of other disorder of lipoprotein metabolism and other lipidemia: Secondary | ICD-10-CM | POA: Diagnosis not present

## 2024-02-11 DIAGNOSIS — M810 Age-related osteoporosis without current pathological fracture: Secondary | ICD-10-CM | POA: Diagnosis present

## 2024-02-11 LAB — CBC
HCT: 30.1 % — ABNORMAL LOW (ref 36.0–46.0)
Hemoglobin: 10 g/dL — ABNORMAL LOW (ref 12.0–15.0)
MCH: 30 pg (ref 26.0–34.0)
MCHC: 33.2 g/dL (ref 30.0–36.0)
MCV: 90.4 fL (ref 80.0–100.0)
Platelets: 137 K/uL — ABNORMAL LOW (ref 150–400)
RBC: 3.33 MIL/uL — ABNORMAL LOW (ref 3.87–5.11)
RDW: 14.3 % (ref 11.5–15.5)
WBC: 7.4 K/uL (ref 4.0–10.5)
nRBC: 0 % (ref 0.0–0.2)

## 2024-02-11 LAB — COMPREHENSIVE METABOLIC PANEL WITH GFR
ALT: 17 U/L (ref 0–44)
AST: 18 U/L (ref 15–41)
Albumin: 3.1 g/dL — ABNORMAL LOW (ref 3.5–5.0)
Alkaline Phosphatase: 210 U/L — ABNORMAL HIGH (ref 38–126)
Anion gap: 24 — ABNORMAL HIGH (ref 5–15)
BUN: 77 mg/dL — ABNORMAL HIGH (ref 8–23)
CO2: 18 mmol/L — ABNORMAL LOW (ref 22–32)
Calcium: 7.1 mg/dL — ABNORMAL LOW (ref 8.9–10.3)
Chloride: 86 mmol/L — ABNORMAL LOW (ref 98–111)
Creatinine, Ser: 16.4 mg/dL — ABNORMAL HIGH (ref 0.44–1.00)
GFR, Estimated: 2 mL/min — ABNORMAL LOW
Glucose, Bld: 107 mg/dL — ABNORMAL HIGH (ref 70–99)
Potassium: 3.9 mmol/L (ref 3.5–5.1)
Sodium: 128 mmol/L — ABNORMAL LOW (ref 135–145)
Total Bilirubin: 0.3 mg/dL (ref 0.0–1.2)
Total Protein: 6.7 g/dL (ref 6.5–8.1)

## 2024-02-11 LAB — I-STAT CHEM 8, ED
BUN: 74 mg/dL — ABNORMAL HIGH (ref 8–23)
Calcium, Ion: 0.82 mmol/L — CL (ref 1.15–1.40)
Chloride: 90 mmol/L — ABNORMAL LOW (ref 98–111)
Creatinine, Ser: >18 mg/dL — ABNORMAL HIGH (ref 0.44–1.00)
Glucose, Bld: 104 mg/dL — ABNORMAL HIGH (ref 70–99)
HCT: 31 % — ABNORMAL LOW (ref 36.0–46.0)
Hemoglobin: 10.5 g/dL — ABNORMAL LOW (ref 12.0–15.0)
Potassium: 3.8 mmol/L (ref 3.5–5.1)
Sodium: 127 mmol/L — ABNORMAL LOW (ref 135–145)
TCO2: 20 mmol/L — ABNORMAL LOW (ref 22–32)

## 2024-02-11 LAB — PROTIME-INR
INR: 1.1 (ref 0.8–1.2)
Prothrombin Time: 14.9 s (ref 11.4–15.2)

## 2024-02-11 LAB — ETHANOL: Alcohol, Ethyl (B): <15 mg/dL

## 2024-02-11 MED ORDER — LIDOCAINE 5 % EX PTCH
1.0000 | MEDICATED_PATCH | CUTANEOUS | Status: DC
  Administered 2024-02-11 – 2024-02-14 (×4): 1 via TRANSDERMAL
  Filled 2024-02-11 (×4): qty 1

## 2024-02-11 MED ORDER — LACTATED RINGERS IV BOLUS
500.0000 mL | Freq: Once | INTRAVENOUS | Status: AC
  Administered 2024-02-11: 500 mL via INTRAVENOUS

## 2024-02-11 MED ORDER — SODIUM CHLORIDE 0.9 % IV SOLN
2.0000 g | Freq: Once | INTRAVENOUS | Status: DC
  Filled 2024-02-11: qty 20

## 2024-02-11 MED ORDER — IOHEXOL 350 MG/ML SOLN
75.0000 mL | Freq: Once | INTRAVENOUS | Status: AC | PRN
  Administered 2024-02-11: 75 mL via INTRAVENOUS

## 2024-02-11 MED ORDER — ACETAMINOPHEN 500 MG PO TABS
1000.0000 mg | ORAL_TABLET | Freq: Once | ORAL | Status: AC
  Administered 2024-02-11: 1000 mg via ORAL
  Filled 2024-02-11: qty 2

## 2024-02-11 MED ORDER — CALCIUM GLUCONATE-NACL 1-0.675 GM/50ML-% IV SOLN
1.0000 g | Freq: Once | INTRAVENOUS | Status: AC
  Administered 2024-02-12: 1000 mg via INTRAVENOUS
  Filled 2024-02-11: qty 50

## 2024-02-11 NOTE — ED Provider Notes (Signed)
 " [facility] Provider Note   HPI/ROS    History obtained from patient.  Colleen Obrien is a 69 y.o. female who presents for Weakness and who  has a past medical history of Anemia in chronic kidney disease (CKD), Arthritis, Asthma, Blood dyscrasia, Chronic renal disease, stage 5, glomerular filtration rate less than or equal to 15 mL/min/1.73 square meter (HCC), Dyspnea, End stage renal disease on dialysis (HCC), GERD (gastroesophageal reflux disease), Gout, Hypertension, Lupus, Narcolepsy, Osteoporosis (05/05/2022), Pneumonia, Sleep apnea, Walker as ambulation aid, and Wheelchair dependent.  Patient presents today for back pain.  States that several days ago she tripped and fell while getting out of bed and hit her back on the nightstand.  Denies any hitting her head or loss of consciousness.  Has had persistent midline neck pain that radiates to bilateral shoulders.  Also has been feeling mildly weak since Thursday.  Denies any abdominal pain or fevers at home.  States her UF on her dialysis is also been appropriate.  Denies any chest pain, shortness breath, nausea, vomiting, or diarrhea.  Was sick several weeks ago, but has been okay since.  MDM   I have reviewed the nursing documentation, vital signs, as well as the past medical history, surgical history, family history, and social history.  Initial Assessment:  Patient hemodynamically stable on initial evaluation.  Was mildly hypotensive in triage, but is now normotensive.  Does get softer blood pressures when she is sleeping.  And intermittently will doze off during conversation secondary to her narcolepsy.  Husband states that this is normal for her.  Regarding generalized weakness, no overt signs of URI-like illness or other infection.  Does not have significant abdominal pain consistent with SBP, but this can be a diagnosis that presents in varying ways.  Will obtain trauma workup and  basic labs and dictate whether SBP workup needs to be done pending initial evaluation.  Trauma imaging largely unremarkable with no acute findings in the CT head, or spine.  Schmorl's node on T11 without other fracture there.  No intracranial hemorrhage, cervical spine fracture or misalignment, or T or L-spine fracture or misalignment.  Labs here somewhat odd though given patient has had significant worsening in her creatinine and electrolytes.  She has not missed any dialysis sessions and has been getting appropriate UF.  Given largely unremarkable workup otherwise for generalized weakness will add on COVID flu and contact nephrology for cultures for SBP.  Patient given 1 g of calcium  gluconate for hypocalcemia here.  Feel this is likely in the setting of her parathyroid  disease.  Spoke with Dr.Schertz from nephrology.  Unable to get SBP cultures tonight due to staffing.  Will treat empirically.  Can still get cultures tomorrow and white blood cell count should be somewhat appropriate.  Treating empirically because he got hypotensive earlier.  Unclear whether that was secondary to narcolepsy or SBP.  Nephrology aware and will evaluate the patient tomorrow.  Spoke with Dr. Geronimo regarding admission.  He plans for admission for further workup and management.  Disposition:  I discussed the case with Dr.Garba who graciously agreed to admit the patient to their service for continued care.    This patient was staffed with Dr. Jakie who supervised the visit and agreed with the plan of care.   Due to the patients current presenting symptoms, physical exam findings, and the workup stated above, it is thought that the etiology of the patients current presentation is:  1. Weakness   2. Acute midline thoracic back pain    Clinical Complexity A medically appropriate history, review of systems, and physical exam was performed.  Factors that affect the complexity of this encounter: assessment of correct  protocol, laboratory work from this visit, and review of echocardiogram/EKG results  My independent interpretations of diagnostic studies are documented in the ED course above.   If decision rules were used in this patient's evaluation, they are listed below.   Click here for ABCD2, HEART and other calculators  Patient's presentation is most consistent with acute complicated illness / injury requiring diagnostic workup.  MDM generated using voice dictation software and may contain dictation errors. Please contact me for any clarification or with any questions.    Physical Exam, PMH, PSH, Family History, and Social Hsitory   Vitals:   02/11/24 2115 02/11/24 2122 02/11/24 2130 02/11/24 2225  BP: 93/73 123/76 94/69 110/78  Pulse: 82 85 79 79  Resp: 18 20 16  (!) 23  Temp:      SpO2: 100% 100% 99% 100%    Physical Exam Constitutional:      Appearance: Normal appearance.     Comments: Intermittently falling asleep during conversation  HENT:     Head: Normocephalic and atraumatic.     Mouth/Throat:     Mouth: Mucous membranes are moist.     Pharynx: Oropharynx is clear.  Eyes:     Extraocular Movements: Extraocular movements intact.     Pupils: Pupils are equal, round, and reactive to light.  Cardiovascular:     Rate and Rhythm: Normal rate and regular rhythm.  Pulmonary:     Effort: Pulmonary effort is normal.     Breath sounds: No wheezing, rhonchi or rales.  Abdominal:     General: Abdomen is flat. There is no distension.     Palpations: Abdomen is soft.     Tenderness: There is no abdominal tenderness. There is no guarding or rebound.     Comments: No signs of peritonitis.  Peritoneal dialysis catheter C/D/I  Musculoskeletal:     Right lower leg: No edema.     Left lower leg: No edema.  Skin:    General: Skin is warm and dry.     Capillary Refill: Capillary refill takes less than 2 seconds.  Neurological:     General: No focal deficit present.     Mental Status: She  is oriented to person, place, and time.     Past Medical History:  Diagnosis Date   Anemia in chronic kidney disease (CKD)    Arthritis    Asthma    Blood dyscrasia    systemic lupus   Chronic renal disease, stage 5, glomerular filtration rate less than or equal to 15 mL/min/1.73 square meter (HCC)    Dyspnea    End stage renal disease on dialysis (HCC)    T, TH, S   GERD (gastroesophageal reflux disease)    Gout    Hypertension    Lupus    Narcolepsy    Osteoporosis 05/05/2022   Pneumonia    x 1   Sleep apnea    tries to use CPAP nightly as of 04/29/22   Walker as Engineer, Water dependent    when going outside home     Past Surgical History:  Procedure Laterality Date   CESAREAN SECTION     x 2   COLONOSCOPY     DIALYSIS/PERMA CATHETER INSERTION N/A 03/07/2023  Procedure: DIALYSIS/PERMA CATHETER INSERTION;  Surgeon: Norine Manuelita LABOR, MD;  Location: [facility];  Service: Cardiovascular;  Laterality: N/A;   ganglion cyst removed Right    arm   IR REMOVAL TUN CV CATH W/O FL  12/06/2023   PARATHYROIDECTOMY N/A 08/18/2023   Procedure: PARATHYROIDECTOMY;  Surgeon: Eletha Boas, MD;  Location: [facility] OR;  Service: General;  Laterality: N/A;  NECK EXPLORATION WITH PARATHYROIDECTOMY POSSIBLE EXCISION THYROID  NODULE   TENCKHOFF CATHETER INSERTION N/A 10/06/2023   Procedure: INSERTION, CATHETER, DIALYSIS, PERITONEAL;  Surgeon: Sheree Penne Bruckner, MD;  Location: [facility] OR;  Service: Vascular;  Laterality: N/A;   TIBIA IM NAIL INSERTION Right 05/03/2022   Procedure: INTRAMEDULLARY (IM) NAIL TIBIAL;  Surgeon: Celena Sharper, MD;  Location: [facility] OR;  Service: Orthopedics;  Laterality: Right;   TOTAL HIP ARTHROPLASTY Left 08/26/2019   Procedure: LEFT TOTAL HIP ARTHROPLASTY ANTERIOR APPROACH;  Surgeon: Jerri Kay HERO, MD;  Location: [facility];  Service: Orthopedics;  Laterality: Left;   TUBAL LIGATION     TUNNELLED CATHETER EXCHANGE N/A 07/03/2023   Procedure: TUNNELLED  CATHETER EXCHANGE;  Surgeon: Magda Debby SAILOR, MD;  Location: [facility];  Service: Cardiovascular;  Laterality: N/A;   TUNNELLED CATHETER EXCHANGE N/A 07/28/2023   Procedure: TUNNELLED CATHETER EXCHANGE;  Surgeon: Gretta Bruckner PARAS, MD;  Location: [facility];  Service: Cardiovascular;  Laterality: N/A;     Family History  Problem Relation Age of Onset   Hypertension Mother    Diabetes Mother    Dementia Father    Hypertension Father    Hyperlipidemia Father    Diabetes Paternal Grandmother    Alcohol abuse Paternal Grandfather     Social History   Tobacco Use   Smoking status: Never   Smokeless tobacco: Never  Substance Use Topics   Alcohol use: No     Procedures   If procedures were preformed on this patient, they are listed below:  Procedures   Electronically signed by:   Glendia Carlin Ancona, M.D. PGY-2, Emergency Medicine   Please note that this documentation was produced with the assistance of voice-to-text technology and may contain errors.    Ancona Glendia, MD 02/11/24 2341  "

## 2024-02-11 NOTE — H&P (Incomplete)
 " History and Physical    Patient: Colleen Obrien FMW:[id number] DOB: 1955/02/05 DOA: 02/11/2024 DOS: the patient was seen and examined on 02/11/2024 PCP: Claudene Pellet, MD  Patient coming from: Home  Chief Complaint:  Chief Complaint  Patient presents with   Weakness   HPI: Colleen Obrien is a 69 y.o. 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  No fever or chills.  Patient has not been herself.  She continues to have her peritoneal dialysis without issues.  On arrival however patient was found to be hypotensive with systolic blood pressure of 75.  She also appears weak and drifting off to sleep.  There is suspicion that patient may have had spontaneous bacterial peritonitis.  Nephrology consulted and patient being admitted to the medical service for further treatment.  She is started empirically on treatment.  Review of Systems: As mentioned in the history of present illness. All other systems reviewed and are negative. Past Medical History:  Diagnosis Date   Anemia in chronic kidney disease (CKD)    Arthritis    Asthma    Blood dyscrasia    systemic lupus   Chronic renal disease, stage 5, glomerular filtration rate less than or equal to 15 mL/min/1.73 square meter (HCC)    Dyspnea    End stage renal disease on dialysis (HCC)    T, TH, S   GERD (gastroesophageal reflux disease)    Gout    Hypertension    Lupus    Narcolepsy    Osteoporosis 05/05/2022   Pneumonia    x 1   Sleep apnea    tries to use CPAP nightly as of 04/29/22   Walker as Engineer, Water dependent    when going outside home   Past Surgical History:  Procedure Laterality Date   CESAREAN SECTION     x 2   COLONOSCOPY      DIALYSIS/PERMA CATHETER INSERTION N/A 03/07/2023   Procedure: DIALYSIS/PERMA CATHETER INSERTION;  Surgeon: Norine Manuelita LABOR, MD;  Location: [facility];  Service: Cardiovascular;  Laterality: N/A;   ganglion cyst removed Right    arm   IR REMOVAL TUN CV CATH W/O FL  12/06/2023   PARATHYROIDECTOMY N/A 08/18/2023   Procedure: PARATHYROIDECTOMY;  Surgeon: Eletha Boas, MD;  Location: [facility];  Service: General;  Laterality: N/A;  NECK EXPLORATION WITH PARATHYROIDECTOMY POSSIBLE EXCISION THYROID  NODULE   TENCKHOFF CATHETER INSERTION N/A 10/06/2023   Procedure: INSERTION, CATHETER, DIALYSIS, PERITONEAL;  Surgeon: Sheree Penne Bruckner, MD;  Location: [facility] OR;  Service: Vascular;  Laterality: N/A;   TIBIA IM NAIL INSERTION Right 05/03/2022   Procedure: INTRAMEDULLARY (IM) NAIL TIBIAL;  Surgeon: Celena Sharper, MD;  Location: [facility];  Service: Orthopedics;  Laterality: Right;   TOTAL HIP ARTHROPLASTY Left 08/26/2019   Procedure: LEFT TOTAL HIP ARTHROPLASTY ANTERIOR APPROACH;  Surgeon: Jerri Kay HERO, MD;  Location: [facility];  Service: Orthopedics;  Laterality: Left;   TUBAL LIGATION     TUNNELLED CATHETER EXCHANGE N/A 07/03/2023   Procedure: TUNNELLED CATHETER EXCHANGE;  Surgeon: Magda Debby SAILOR, MD;  Location: [facility];  Service: Cardiovascular;  Laterality: N/A;   TUNNELLED CATHETER EXCHANGE N/A 07/28/2023   Procedure: TUNNELLED CATHETER EXCHANGE;  Surgeon: Gretta Bruckner PARAS, MD;  Location: [facility];  Service: Cardiovascular;  Laterality: N/A;  Social History:  reports that she has never smoked. She has never used smokeless tobacco. She reports that she does not drink alcohol and does not use drugs.  Allergies[1]  Family History  Problem Relation Age of Onset   Hypertension Mother    Diabetes Mother    Dementia Father    Hypertension Father    Hyperlipidemia Father    Diabetes Paternal Grandmother    Alcohol abuse Paternal Grandfather     Prior to Admission medications   Medication Sig Start Date End Date Taking? Authorizing Provider  acetaminophen  (TYLENOL ) 500 MG tablet Take 500 mg by mouth every 6 (six) hours as needed (for pain.).    [provider]  albuterol  (PROVENTIL  HFA;VENTOLIN  HFA) 108 (90 BASE) MCG/ACT inhaler Inhale 2 puffs into the lungs every 6 (six) hours as needed for wheezing or shortness of breath.    [provider]  allopurinol  (ZYLOPRIM ) 300 MG tablet Take 300 mg by mouth in the morning.    [provider]  AURYXIA  1 GM 210 MG(Fe) tablet Take 420 mg by mouth 3 (three) times daily with meals. 07/31/23   [provider]  B Complex-C-Zn-Folic Acid  (DIALYVITE /ZINC ) TABS Take 1 tablet by mouth in the morning.    [provider]  budesonide -formoterol  (SYMBICORT) 80-4.5 MCG/ACT inhaler Inhale 2 puffs into the lungs in the morning and at bedtime.    [provider]  cinacalcet (SENSIPAR) 30 MG tablet Take 30 mg by mouth daily. 09/21/23   [provider]  Cyanocobalamin (VITAMIN B-12 PO) Take 1 tablet by mouth 2 (two) times a week.    [provider]  furosemide  (LASIX ) 80 MG tablet Take 80 mg by mouth See admin instructions. Take ONLY on Sun Mon Wed Fri    [provider]  hydroxychloroquine  (PLAQUENIL ) 200 MG tablet Take 200 mg by mouth in the morning.    [provider]  labetalol  (NORMODYNE ) 100 MG tablet Take 1 tablet by mouth 2 (two) times daily. 04/04/23   [provider]  methocarbamol  (ROBAXIN ) 500 MG tablet Take 0.5 tablets (250 mg total) by mouth every 8 (eight) hours as needed for muscle spasms. 09/22/22   Tobie Yetta HERO, MD  ondansetron  (ZOFRAN ) 4 MG tablet Take 4 mg by mouth every 8 (eight) hours as needed for nausea or vomiting. 04/21/23   [provider]  pantoprazole  (PROTONIX ) 40 MG tablet Take 40 mg by mouth 2 (two) times daily. 01/06/23   [provider]  traMADol  (ULTRAM ) 50 MG tablet Take 1 tablet (50 mg total) by mouth  every 6 (six) hours as needed for moderate pain (pain score 4-6). 10/06/23   Rhyne, Samantha J, PA-C  triamcinolone  cream (KENALOG) 0.1 % Apply 1 Application topically 2 (two) times daily as needed (skin irritation.).    [provider]    Physical Exam: Vitals:   02/11/24 2115 02/11/24 2122 02/11/24 2130 02/11/24 2225  BP: 93/73 123/76 94/69 110/78  Pulse: 82 85 79 79  Resp: 18 20 16  (!) 23  Temp:      SpO2: 100% 100% 99% 100%   Constitutional: Acutely ill looking, confused and NAD, calm, comfortable Eyes: PERRL, lids and conjunctivae normal ENMT: Mucous membranes are moist. Posterior pharynx clear of any exudate or lesions.Normal dentition.  Neck: normal, supple, no masses, no thyromegaly Respiratory: clear to auscultation bilaterally, no wheezing, no crackles. Normal respiratory effort. No accessory muscle use.  Cardiovascular: Regular rate and rhythm, no murmurs / rubs / gallops. No  extremity edema. 2+ pedal pulses. No carotid bruits.  Abdomen: no tenderness, no masses palpated. No hepatosplenomegaly. Bowel sounds positive.  Musculoskeletal: Good range of motion, no joint swelling or tenderness, Skin: no rashes, lesions, ulcers. No induration Neurologic: CN 2-12 grossly intact. Sensation intact, DTR normal. Strength 5/5 in all 4.  Psychiatric: Confused, weak, due to to sleep  Data Reviewed:  Temperature 99, blood pressure 75/58, pulse 90, respiratory 23, white count 7.4 hemoglobin 10.5 and platelets 137.  Sodium 127 CO2 18 BUN is 75 creatinine more than 18 calcium  7.1 calcium  0.82.  Platelets 137.  CT cervical spine showed no acute findings.  CT chest and abdomen showed moderate pneumoperitoneum likely iatrogenic in the setting of peritoneal dialysis but no evidence of acute traumatic injury head CT showed no acute findings.  Assessment and Plan:  #1 generalized weakness: Most likely multifactorial.  Suspected SBP.  Initial empiric antibiotics with Zosyn .  Get nephrology to  follow.  Cultures will likely be sent after dialysis.  #2 end-stage renal disease: Continue peritoneal dialysis per nephrology.  #3 hypotension:    Advance Care Planning:   Code Status: Prior ***  Consults: ***  Family Communication: ***  Severity of Illness: {Observation/Inpatient:21159}  AuthorBETHA SIM KNOLL, MD 02/11/2024 11:43 PM  For on call review [email or web address].       [1] Allergies Allergen Reactions   Erythromycin Nausea And Vomiting   Levofloxacin     Causes muscle pain    Sulfa Antibiotics Hives   Latex Other (See Comments)    Skin redness  "

## 2024-02-11 NOTE — ED Notes (Signed)
 Pt to CT

## 2024-02-11 NOTE — ED Triage Notes (Signed)
 Pt here for weakness and back pain. Dialysis patient and parathyroid  issues. Hypotensive in triage. Feeling weak since Thursday- denies fevers. Vomiting before that about 3 weeks ago. Pain across shoulders. Clemens recently but has pain across top part of back.

## 2024-02-11 NOTE — H&P (Signed)
 " History and Physical    Patient: Colleen Obrien FMW:[id number] DOB: [birth year] DOA: 02/11/2024 DOS: the patient was seen and examined on 02/11/2024 PCP: Claudene Pellet, MD  Patient coming from: Home  Chief Complaint:  Chief Complaint  Patient presents with   Weakness   HPI: Colleen Obrien is a 69 y.o. 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  No fever or chills.  Patient has not been herself.  She continues to have her peritoneal dialysis without issues.  On arrival however patient was found to be hypotensive with systolic blood pressure of 75.  She also appears weak and drifting off to sleep.  There is suspicion that patient may have had spontaneous bacterial peritonitis.  Nephrology consulted and patient being admitted to the medical service for further treatment.  She is started empirically on treatment.  Review of Systems: As mentioned in the history of present illness. All other systems reviewed and are negative. Past Medical History:  Diagnosis Date   Anemia in chronic kidney disease (CKD)    Arthritis    Asthma    Blood dyscrasia    systemic lupus   Chronic renal disease, stage 5, glomerular filtration rate less than or equal to 15 mL/min/1.73 square meter (HCC)    Dyspnea    End stage renal disease on dialysis (HCC)    T, TH, S   GERD (gastroesophageal reflux disease)    Gout    Hypertension    Lupus    Narcolepsy    Osteoporosis 05/05/2022   Pneumonia    x 1   Sleep apnea    tries to use CPAP nightly as of 04/29/22   [employer] as Engineer, Water dependent    when going outside home   Past Surgical History:  Procedure Laterality Date   CESAREAN SECTION     x 2   COLONOSCOPY     DIALYSIS/PERMA  CATHETER INSERTION N/A 03/07/2023   Procedure: DIALYSIS/PERMA CATHETER INSERTION;  Surgeon: Norine Manuelita LABOR, MD;  Location: [facility];  Service: Cardiovascular;  Laterality: N/A;   ganglion cyst removed Right    arm   IR REMOVAL TUN CV CATH W/O FL  12/06/2023   PARATHYROIDECTOMY N/A 08/18/2023   Procedure: PARATHYROIDECTOMY;  Surgeon: Eletha Boas, MD;  Location: [facility] OR;  Service: General;  Laterality: N/A;  NECK EXPLORATION WITH PARATHYROIDECTOMY POSSIBLE EXCISION THYROID  NODULE   TENCKHOFF CATHETER INSERTION N/A 10/06/2023   Procedure: INSERTION, CATHETER, DIALYSIS, PERITONEAL;  Surgeon: Sheree Penne Bruckner, MD;  [facility];  Service: Vascular;  Laterality: N/A;   TIBIA IM NAIL INSERTION Right 05/03/2022   Procedure: INTRAMEDULLARY (IM) NAIL TIBIAL;  Surgeon: Celena Sharper, MD;  Location: [facility] OR;  Service: Orthopedics;  Laterality: Right;   TOTAL HIP ARTHROPLASTY Left 08/26/2019   Procedure: LEFT TOTAL HIP ARTHROPLASTY ANTERIOR APPROACH;  Surgeon: Jerri Kay HERO, MD;  Location: [facility] OR;  Service: Orthopedics;  Laterality: Left;   TUBAL LIGATION     TUNNELLED CATHETER EXCHANGE N/A 07/03/2023   Procedure: TUNNELLED CATHETER EXCHANGE;  Surgeon: Magda Debby SAILOR, MD;  Location: [facility];  Service: Cardiovascular;  Laterality: N/A;   TUNNELLED CATHETER EXCHANGE N/A 07/28/2023   Procedure: TUNNELLED CATHETER EXCHANGE;  Surgeon: Gretta Bruckner PARAS, MD;  Location: [facility];  Service: Cardiovascular;  Laterality: N/A;  Social History:  reports that she has never smoked. She has never used smokeless tobacco. She reports that she does not drink alcohol and does not use drugs.  Allergies[1]  Family History  Problem Relation Age of Onset   Hypertension Mother    Diabetes Mother    Dementia Father    Hypertension Father    Hyperlipidemia Father    Diabetes Paternal Grandmother    Alcohol abuse Paternal Grandfather     Prior to Admission medications  Medication Sig Start Date End  Date Taking? Authorizing Provider  acetaminophen  (TYLENOL ) 500 MG tablet Take 500 mg by mouth every 6 (six) hours as needed (for pain.).    [provider]  albuterol  (PROVENTIL  HFA;VENTOLIN  HFA) 108 (90 BASE) MCG/ACT inhaler Inhale 2 puffs into the lungs every 6 (six) hours as needed for wheezing or shortness of breath.    [provider]  allopurinol  (ZYLOPRIM ) 300 MG tablet Take 300 mg by mouth in the morning.    [provider]  AURYXIA  1 GM 210 MG(Fe) tablet Take 420 mg by mouth 3 (three) times daily with meals. 07/31/23   [provider]  B Complex-C-Zn-Folic Acid  (DIALYVITE /ZINC ) TABS Take 1 tablet by mouth in the morning.    [provider]  budesonide -formoterol  (SYMBICORT) 80-4.5 MCG/ACT inhaler Inhale 2 puffs into the lungs in the morning and at bedtime.    [provider]  cinacalcet (SENSIPAR) 30 MG tablet Take 30 mg by mouth daily. 09/21/23   [provider]  Cyanocobalamin (VITAMIN B-12 PO) Take 1 tablet by mouth 2 (two) times a week.    [provider]  furosemide  (LASIX ) 80 MG tablet Take 80 mg by mouth See admin instructions. Take ONLY on Sun Mon Wed Fri    [provider]  hydroxychloroquine  (PLAQUENIL ) 200 MG tablet Take 200 mg by mouth in the morning.    [provider]  labetalol  (NORMODYNE ) 100 MG tablet Take 1 tablet by mouth 2 (two) times daily. 04/04/23   [provider]  methocarbamol  (ROBAXIN ) 500 MG tablet Take 0.5 tablets (250 mg total) by mouth every 8 (eight) hours as needed for muscle spasms. 09/22/22   Tobie Yetta HERO, MD  ondansetron  (ZOFRAN ) 4 MG tablet Take 4 mg by mouth every 8 (eight) hours as needed for nausea or vomiting. 04/21/23   [provider]  pantoprazole  (PROTONIX ) 40 MG tablet Take 40 mg by mouth 2 (two) times daily. 01/06/23   [provider]  traMADol  (ULTRAM ) 50 MG tablet Take 1 tablet (50 mg total) by mouth every 6 (six) hours as needed for  moderate pain (pain score 4-6). 10/06/23   Rhyne, Samantha J, PA-C  triamcinolone  cream (KENALOG) 0.1 % Apply 1 Application topically 2 (two) times daily as needed (skin irritation.).    [provider]    Physical Exam: Vitals:   02/11/24 2115 02/11/24 2122 02/11/24 2130 02/11/24 2225  BP: 93/73 123/76 94/69 110/78  Pulse: 82 85 79 79  Resp: 18 20 16  (!) 23  Temp:      SpO2: 100% 100% 99% 100%   Constitutional: Acutely ill looking, confused and NAD, calm, comfortable Eyes: PERRL, lids and conjunctivae normal ENMT: Mucous membranes are moist. Posterior pharynx clear of any exudate or lesions.Normal dentition.  Neck: normal, supple, no masses, no thyromegaly Respiratory: clear to auscultation bilaterally, no wheezing, no crackles. Normal respiratory effort. No accessory muscle use.  Cardiovascular: Regular rate and rhythm, no murmurs / rubs / gallops. No  extremity edema. 2+ pedal pulses. No carotid bruits.  Abdomen: no tenderness, no masses palpated. No hepatosplenomegaly. Bowel sounds positive.  Musculoskeletal: Good range of motion, no joint swelling or tenderness, Skin: no rashes, lesions, ulcers. No induration Neurologic: CN 2-12 grossly intact. Sensation intact, DTR normal. Strength 5/5 in all 4.  Psychiatric: Confused, weak, due to to sleep  Data Reviewed:  Temperature 99, blood pressure 75/58, pulse 90, respiratory 23, white count 7.4 hemoglobin 10.5 and platelets 137.  Sodium 127 CO2 18 BUN is 75 creatinine more than 18 calcium  7.1 calcium  0.82.  Platelets 137.  CT cervical spine showed no acute findings.  CT chest and abdomen showed moderate pneumoperitoneum likely iatrogenic in the setting of peritoneal dialysis but no evidence of acute traumatic injury head CT showed no acute findings.  Assessment and Plan:  #1 generalized weakness: Most likely multifactorial.  Suspected SBP.  Initial empiric antibiotics with Zosyn .  Get nephrology to follow.  Cultures will likely be  sent after dialysis.  #2 end-stage renal disease: Continue peritoneal dialysis per nephrology.  #3 hypotension: Blood pressure a little better with fluids.  #4 essential hypertension: Holding medications since blood pressure is low.  #5 obstructive sleep apnea: Consider CPAP at night.  #6 anemia of chronic disease: H&H is stable.  Continue to monitor    Advance Care Planning:   Code Status: Prior full code  Consults: Nephrologist Dr. Geralynn  Family Communication: No family at bedside  Severity of Illness: The appropriate patient status for this patient is INPATIENT. Inpatient status is judged to be reasonable and necessary in order to provide the required intensity of service to ensure the patient's safety. The patient's presenting symptoms, physical exam findings, and initial radiographic and laboratory data in the context of their chronic comorbidities is felt to place them at high risk for further clinical deterioration. Furthermore, it is not anticipated that the patient will be medically stable for discharge from the hospital within 2 midnights of admission.   * I certify that at the point of admission it is my clinical judgment that the patient will require inpatient hospital care spanning beyond 2 midnights from the point of admission due to high intensity of service, high risk for further deterioration and high frequency of surveillance required.*  AuthorBETHA SIM KNOLL, MD 02/11/2024 11:43 PM  For on call review [email or web address].     [1]  Allergies Allergen Reactions   Erythromycin Nausea And Vomiting   Levofloxacin     Causes muscle pain    Sulfa Antibiotics Hives   Latex Other (See Comments)    Skin redness   "

## 2024-02-12 ENCOUNTER — Encounter (HOSPITAL_COMMUNITY): Payer: Self-pay | Admitting: Internal Medicine

## 2024-02-12 ENCOUNTER — Other Ambulatory Visit: Payer: Self-pay

## 2024-02-12 DIAGNOSIS — N186 End stage renal disease: Secondary | ICD-10-CM | POA: Diagnosis present

## 2024-02-12 LAB — COMPREHENSIVE METABOLIC PANEL WITH GFR
ALT: 18 U/L (ref 0–44)
AST: 16 U/L (ref 15–41)
Albumin: 2.9 g/dL — ABNORMAL LOW (ref 3.5–5.0)
Alkaline Phosphatase: 192 U/L — ABNORMAL HIGH (ref 38–126)
Anion gap: 23 — ABNORMAL HIGH (ref 5–15)
BUN: 76 mg/dL — ABNORMAL HIGH (ref 8–23)
CO2: 20 mmol/L — ABNORMAL LOW (ref 22–32)
Calcium: 7.4 mg/dL — ABNORMAL LOW (ref 8.9–10.3)
Chloride: 85 mmol/L — ABNORMAL LOW (ref 98–111)
Creatinine, Ser: 16.7 mg/dL — ABNORMAL HIGH (ref 0.44–1.00)
GFR, Estimated: 2 mL/min — ABNORMAL LOW
Glucose, Bld: 84 mg/dL (ref 70–99)
Potassium: 3.7 mmol/L (ref 3.5–5.1)
Sodium: 128 mmol/L — ABNORMAL LOW (ref 135–145)
Total Bilirubin: 0.3 mg/dL (ref 0.0–1.2)
Total Protein: 6.1 g/dL — ABNORMAL LOW (ref 6.5–8.1)

## 2024-02-12 LAB — CBC
HCT: 27.9 % — ABNORMAL LOW (ref 36.0–46.0)
Hemoglobin: 9.4 g/dL — ABNORMAL LOW (ref 12.0–15.0)
MCH: 30 pg (ref 26.0–34.0)
MCHC: 33.7 g/dL (ref 30.0–36.0)
MCV: 89.1 fL (ref 80.0–100.0)
Platelets: 131 K/uL — ABNORMAL LOW (ref 150–400)
RBC: 3.13 MIL/uL — ABNORMAL LOW (ref 3.87–5.11)
RDW: 14.1 % (ref 11.5–15.5)
WBC: 6.8 K/uL (ref 4.0–10.5)
nRBC: 0 % (ref 0.0–0.2)

## 2024-02-12 LAB — RESP PANEL BY RT-PCR (RSV, FLU A&B, COVID)  RVPGX2
Influenza A by PCR: NEGATIVE
Influenza B by PCR: NEGATIVE
Resp Syncytial Virus by PCR: NEGATIVE
SARS Coronavirus 2 by RT PCR: NEGATIVE

## 2024-02-12 LAB — HIV ANTIBODY (ROUTINE TESTING W REFLEX): HIV Screen 4th Generation wRfx: NONREACTIVE

## 2024-02-12 MED ORDER — GENTAMICIN SULFATE 0.1 % EX CREA
1.0000 | TOPICAL_CREAM | Freq: Every day | CUTANEOUS | Status: DC
  Administered 2024-02-14 – 2024-02-15 (×3): 1 via TOPICAL
  Filled 2024-02-12 (×2): qty 15

## 2024-02-12 MED ORDER — SORBITOL 70 % SOLN: 30.0000 mL | Status: DC | PRN

## 2024-02-12 MED ORDER — LIDOCAINE 5 % EX PTCH: 1.0000 | MEDICATED_PATCH | CUTANEOUS | Status: DC

## 2024-02-12 MED ORDER — ONDANSETRON HCL 4 MG/2ML IJ SOLN
4.0000 mg | Freq: Four times a day (QID) | INTRAMUSCULAR | Status: DC | PRN
  Administered 2024-02-13: 4 mg via INTRAVENOUS
  Filled 2024-02-12: qty 2

## 2024-02-12 MED ORDER — CAMPHOR-MENTHOL 0.5-0.5 % EX LOTN: 1.0000 | TOPICAL_LOTION | Freq: Three times a day (TID) | CUTANEOUS | Status: DC | PRN

## 2024-02-12 MED ORDER — ACETAMINOPHEN 650 MG RE SUPP: 650.0000 mg | Freq: Four times a day (QID) | RECTAL | Status: DC | PRN

## 2024-02-12 MED ORDER — HEPARIN SODIUM (PORCINE) 5000 UNIT/ML IJ SOLN
5000.0000 [IU] | Freq: Three times a day (TID) | INTRAMUSCULAR | Status: DC
  Administered 2024-02-12 – 2024-02-15 (×9): 5000 [IU] via SUBCUTANEOUS
  Filled 2024-02-12 (×10): qty 1

## 2024-02-12 MED ORDER — CALCIUM CARBONATE ANTACID 1250 MG/5ML PO SUSP: 500.0000 mg | Freq: Four times a day (QID) | ORAL | Status: DC | PRN

## 2024-02-12 MED ORDER — NEPRO/CARBSTEADY PO LIQD: 237.0000 mL | Freq: Three times a day (TID) | ORAL | Status: DC | PRN

## 2024-02-12 MED ORDER — PIPERACILLIN-TAZOBACTAM IN DEX 2-0.25 GM/50ML IV SOLN
2.2500 g | Freq: Three times a day (TID) | INTRAVENOUS | Status: DC
  Administered 2024-02-12 – 2024-02-15 (×8): 2.25 g via INTRAVENOUS
  Filled 2024-02-12 (×12): qty 50

## 2024-02-12 MED ORDER — PIPERACILLIN-TAZOBACTAM 3.375 G IVPB
3.3750 g | Freq: Three times a day (TID) | INTRAVENOUS | Status: DC
  Administered 2024-02-12 (×2): 3.375 g via INTRAVENOUS
  Filled 2024-02-12 (×2): qty 50

## 2024-02-12 MED ORDER — ACETAMINOPHEN 325 MG PO TABS
650.0000 mg | ORAL_TABLET | Freq: Four times a day (QID) | ORAL | Status: DC | PRN
  Administered 2024-02-12 – 2024-02-14 (×4): 650 mg via ORAL
  Filled 2024-02-12 (×4): qty 2

## 2024-02-12 MED ORDER — HYDROXYZINE HCL 25 MG PO TABS: 25.0000 mg | ORAL_TABLET | Freq: Three times a day (TID) | ORAL | Status: DC | PRN

## 2024-02-12 MED ORDER — DOCUSATE SODIUM 283 MG RE ENEM: 1.0000 | ENEMA | RECTAL | Status: DC | PRN

## 2024-02-12 MED ORDER — DELFLEX-LC/1.5% DEXTROSE 344 MOSM/L IP SOLN: INTRAPERITONEAL | Status: DC

## 2024-02-12 MED ORDER — ONDANSETRON HCL 4 MG PO TABS: 4.0000 mg | ORAL_TABLET | Freq: Four times a day (QID) | ORAL | Status: DC | PRN

## 2024-02-12 NOTE — Consult Note (Signed)
 [facility] Renal Consultation Note    Indication for Consultation:  Management of ESRD/hemodialysis; anemia, hypertension/volume and secondary hyperparathyroidism  ERE:Dfpuy, Candace, MD  HPI: Colleen Obrien is a 69 y.o. female with ESRD on CCPD at [facility]. She has a past medical history significant for lupus nephritis, GERD, narcolepsy, obstructive sleep apnea, asthma, osteoporosis, and secondary hyperparathyroidism who presents to the ED c/o generalized weakness.  Seen and examined patient at bedside in the ED.  She is accompanied by her son. She reports feeling weak/tired for 1 month and lately c/o back pain and sneezing/coughing. Currently remains afebrile and on RA. COVID, Influenza A+B, and RSV are all negative. CT ABD/pelvis showed small bilateral renal cysts with no evidence of stones in kidneys, ureters, also no hydronephrosis. Spinal and head CT also negative. She denies SOB, CP, ABD pain, and N/V. Plan to continue PD tonight and will use all 1.5% bags.  Past Medical History:  Diagnosis Date   Anemia in chronic kidney disease (CKD)    Arthritis    Asthma    Blood dyscrasia    systemic lupus   Chronic renal disease, stage 5, glomerular filtration rate less than or equal to 15 mL/min/1.73 square meter (HCC)    Dyspnea    End stage renal disease on dialysis (HCC)    T, TH, S   GERD (gastroesophageal reflux disease)    Gout    Hypertension    Lupus    Narcolepsy    Osteoporosis 05/05/2022   Pneumonia    x 1   Sleep apnea    tries to use CPAP nightly as of 04/29/22   Walker as Engineer, Water dependent    when going outside home   Past Surgical History:  Procedure Laterality Date   CESAREAN SECTION     x 2   COLONOSCOPY     DIALYSIS/PERMA CATHETER INSERTION N/A 03/07/2023   Procedure: DIALYSIS/PERMA CATHETER INSERTION;  Surgeon: Norine Manuelita LABOR, MD;  Location: [facility] IN[facility];  Service: Cardiovascular;   Laterality: N/A;   ganglion cyst removed Right    arm   IR REMOVAL TUN CV CATH W/O FL  12/06/2023   PARATHYROIDECTOMY N/A 08/18/2023   Procedure: PARATHYROIDECTOMY;  Surgeon: Eletha Boas, MD;  Location: [facility] OR;  Service: General;  Laterality: N/A;  NECK EXPLORATION WITH PARATHYROIDECTOMY POSSIBLE EXCISION THYROID  NODULE   TENCKHOFF CATHETER INSERTION N/A 10/06/2023   Procedure: INSERTION, CATHETER, DIALYSIS, PERITONEAL;  Surgeon: Sheree Penne Bruckner, MD;  Location: [facility] OR;  Service: Vascular;  Laterality: N/A;   TIBIA IM NAIL INSERTION Right 05/03/2022   Procedure: INTRAMEDULLARY (IM) NAIL TIBIAL;  Surgeon: Celena Sharper, MD;  Location: [facility] OR;  Service: Orthopedics;  Laterality: Right;   TOTAL HIP ARTHROPLASTY Left 08/26/2019   Procedure: LEFT TOTAL HIP ARTHROPLASTY ANTERIOR APPROACH;  Surgeon: Jerri Kay HERO, MD;  Location: [facility] OR;  Service: Orthopedics;  Laterality: Left;   TUBAL LIGATION     TUNNELLED CATHETER EXCHANGE N/A 07/03/2023   Procedure: TUNNELLED CATHETER EXCHANGE;  Surgeon: Magda Debby SAILOR, MD;  Location: [facility];  Service: Cardiovascular;  Laterality: N/A;   TUNNELLED CATHETER EXCHANGE N/A 07/28/2023   Procedure: TUNNELLED CATHETER EXCHANGE;  Surgeon: Gretta Bruckner PARAS, MD;  Location: [facility];  Service: Cardiovascular;  Laterality: N/A;   Family History  Problem Relation Age of Onset   Hypertension Mother    Diabetes Mother    Dementia Father    Hypertension Father  Hyperlipidemia Father    Diabetes Paternal Grandmother    Alcohol abuse Paternal Grandfather    Social History:  reports that she has never smoked. She has never used smokeless tobacco. She reports that she does not drink alcohol and does not use drugs. Allergies[1] Prior to Admission medications  Medication Sig Start Date End Date Taking? Authorizing Provider  acetaminophen  (TYLENOL ) 500 MG tablet Take 500 mg by mouth every 6 (six) hours as needed (for pain.).   Yes [provider]   albuterol  (PROVENTIL  HFA;VENTOLIN  HFA) 108 (90 BASE) MCG/ACT inhaler Inhale 2 puffs into the lungs every 6 (six) hours as needed for wheezing or shortness of breath.   Yes [provider]  allopurinol  (ZYLOPRIM ) 100 MG tablet Take 100 mg by mouth daily. 11/30/23  Yes [provider]  B Complex-C-Zn-Folic Acid  (DIALYVITE /ZINC ) TABS Take 1 tablet by mouth in the morning.   Yes [provider]  budesonide -formoterol  (SYMBICORT) 80-4.5 MCG/ACT inhaler Inhale 2 puffs into the lungs in the morning and at bedtime.   Yes [provider]  calcitRIOL  (ROCALTROL ) 0.5 MCG capsule Take 0.5 mcg by mouth daily. 11/30/23  Yes [provider]  carboxymethylcellulose (REFRESH PLUS) 0.5 % SOLN Place 1 drop into both eyes 3 (three) times daily as needed (dry eye/eye irritation).   Yes [provider]  cinacalcet (SENSIPAR) 30 MG tablet Take 30 mg by mouth daily. 01/22/24  Yes [provider]  colchicine  0.6 MG tablet Take 0.6 mg by mouth daily. 01/27/24  Yes [provider]  ENULOSE 10 GM/15ML SOLN Take 20 g by mouth every 4 (four) hours as needed. 12/14/23  Yes [provider]  felodipine  (PLENDIL ) 5 MG 24 hr tablet Take 5 mg by mouth daily. 01/15/24  Yes [provider]  ferric citrate  (AURYXIA ) 1 GM 210 MG(Fe) tablet Take 420 mg by mouth 3 (three) times daily with meals. 07/27/23 07/26/24 Yes [provider]  furosemide  (LASIX ) 80 MG tablet Take 80 mg by mouth daily.   Yes [provider]  Homeopathic Products (ARNICARE ARTHRITIS) CREA Apply 1 Application topically daily as needed (arthritis pain).   Yes [provider]  hydroxychloroquine  (PLAQUENIL ) 200 MG tablet Take 200 mg by mouth in the morning.   Yes [provider]  labetalol  (NORMODYNE ) 100 MG tablet Take 1 tablet by mouth 2 (two) times daily. 04/04/23  Yes [provider]  lidocaine  4 % Place 3 patches onto the skin daily.    Yes [provider]  ondansetron  (ZOFRAN ) 4 MG tablet Take 4 mg by mouth every 8 (eight) hours as needed for nausea or vomiting. 04/21/23  Yes [provider]  pantoprazole  (PROTONIX ) 40 MG tablet Take 40 mg by mouth 2 (two) times daily. 01/06/23  Yes [provider]  triamcinolone  cream (KENALOG) 0.1 % Apply 1 Application topically 2 (two) times daily as needed (skin irritation.).   Yes [provider]   Current Facility-Administered Medications  Medication Dose Route Frequency Provider Last Rate Last Admin   acetaminophen  (TYLENOL ) tablet 650 mg  650 mg Oral Q6H PRN Garba, Mohammad L, MD   650 mg at 02/12/24 9195   Or   acetaminophen  (TYLENOL ) suppository 650 mg  650 mg Rectal Q6H PRN Sim Emery CROME, MD       calcium  carbonate (dosed in mg elemental calcium ) suspension 500 mg of elemental calcium   500 mg of elemental calcium  Oral Q6H PRN Sim Emery CROME, MD       camphor-menthol  (  SARNA) lotion 1 Application  1 Application Topical Q8H PRN Garba, Mohammad L, MD       And   hydrOXYzine  (ATARAX ) tablet 25 mg  25 mg Oral Q8H PRN Sim Emery CROME, MD       docusate sodium  (ENEMEEZ) enema 283 mg  1 enema Rectal PRN Garba, Mohammad L, MD       feeding supplement (NEPRO CARB STEADY) liquid 237 mL  237 mL Oral TID PRN Garba, Mohammad L, MD       heparin  injection 5,000 Units  5,000 Units Subcutaneous Q8H Sim Emery CROME, MD   5,000 Units at 02/12/24 0545   lidocaine  (LIDODERM ) 5 % 1 patch  1 patch Transdermal Q24H Guillermina Hamilton, MD   1 patch at 02/11/24 2355   ondansetron  (ZOFRAN ) tablet 4 mg  4 mg Oral Q6H PRN Sim Emery CROME, MD       Or   ondansetron  (ZOFRAN ) injection 4 mg  4 mg Intravenous Q6H PRN Sim Emery CROME, MD       piperacillin -tazobactam (ZOSYN ) IVPB 3.375 g  3.375 g Intravenous Q8H Garba, Mohammad L, MD 12.5 mL/hr at 02/12/24 0942 3.375 g at 02/12/24 0942   sorbitol  70 % solution 30 mL  30 mL Oral PRN Sim Emery CROME, MD       Current  Outpatient Medications  Medication Sig Dispense Refill   acetaminophen  (TYLENOL ) 500 MG tablet Take 500 mg by mouth every 6 (six) hours as needed (for pain.).     albuterol  (PROVENTIL  HFA;VENTOLIN  HFA) 108 (90 BASE) MCG/ACT inhaler Inhale 2 puffs into the lungs every 6 (six) hours as needed for wheezing or shortness of breath.     allopurinol  (ZYLOPRIM ) 100 MG tablet Take 100 mg by mouth daily.     B Complex-C-Zn-Folic Acid  (DIALYVITE /ZINC ) TABS Take 1 tablet by mouth in the morning.     budesonide -formoterol  (SYMBICORT) 80-4.5 MCG/ACT inhaler Inhale 2 puffs into the lungs in the morning and at bedtime.     calcitRIOL  (ROCALTROL ) 0.5 MCG capsule Take 0.5 mcg by mouth daily.     carboxymethylcellulose (REFRESH PLUS) 0.5 % SOLN Place 1 drop into both eyes 3 (three) times daily as needed (dry eye/eye irritation).     cinacalcet (SENSIPAR) 30 MG tablet Take 30 mg by mouth daily.     colchicine  0.6 MG tablet Take 0.6 mg by mouth daily.     ENULOSE 10 GM/15ML SOLN Take 20 g by mouth every 4 (four) hours as needed.     felodipine  (PLENDIL ) 5 MG 24 hr tablet Take 5 mg by mouth daily.     ferric citrate  (AURYXIA ) 1 GM 210 MG(Fe) tablet Take 420 mg by mouth 3 (three) times daily with meals.     furosemide  (LASIX ) 80 MG tablet Take 80 mg by mouth daily.     Homeopathic Products (ARNICARE ARTHRITIS) CREA Apply 1 Application topically daily as needed (arthritis pain).     hydroxychloroquine  (PLAQUENIL ) 200 MG tablet Take 200 mg by mouth in the morning.     labetalol  (NORMODYNE ) 100 MG tablet Take 1 tablet by mouth 2 (two) times daily.     lidocaine  4 % Place 3 patches onto the skin daily.     ondansetron  (ZOFRAN ) 4 MG tablet Take 4 mg by mouth every 8 (eight) hours as needed for nausea or vomiting.     pantoprazole  (PROTONIX ) 40 MG tablet Take 40 mg by mouth 2 (two) times daily.     triamcinolone  cream (KENALOG) 0.1 %  Apply 1 Application topically 2 (two) times daily as needed (skin irritation.).      Labs: Basic Metabolic Panel: Recent Labs  Lab 02/11/24 2126 02/11/24 2132 02/12/24 0419  NA 128* 127* 128*  K 3.9 3.8 3.7  CL 86* 90* 85*  CO2 18*  --  20*  GLUCOSE 107* 104* 84  BUN 77* 74* 76*  CREATININE 16.40* >18.00* 16.70*  CALCIUM  7.1*  --  7.4*   Liver Function Tests: Recent Labs  Lab 02/11/24 2126 02/12/24 0419  AST 18 16  ALT 17 18  ALKPHOS 210* 192*  BILITOT 0.3 0.3  PROT 6.7 6.1*  ALBUMIN  3.1* 2.9*   No results for input(s): LIPASE, AMYLASE in the last 168 hours. No results for input(s): AMMONIA in the last 168 hours. CBC: Recent Labs  Lab 02/11/24 2126 02/11/24 2132 02/12/24 0419  WBC 7.4  --  6.8  HGB 10.0* 10.5* 9.4*  HCT 30.1* 31.0* 27.9*  MCV 90.4  --  89.1  PLT 137*  --  131*   Cardiac Enzymes: No results for input(s): CKTOTAL, CKMB, CKMBINDEX, TROPONINI in the last 168 hours. CBG: No results for input(s): GLUCAP in the last 168 hours. Iron  Studies: No results for input(s): IRON , TIBC, TRANSFERRIN, FERRITIN in the last 72 hours. Studies/Results: CT CHEST ABDOMEN PELVIS W CONTRAST Result Date: 02/11/2024 EXAM: CT CHEST, ABDOMEN AND PELVIS WITH CONTRAST 02/11/2024 11:06:33 PM TECHNIQUE: CT of the chest, abdomen and pelvis was performed with the administration of 75 mL iohexol  (OMNIPAQUE ) 350 MG/ML injection. Multiplanar reformatted images are provided for review. Automated exposure control, iterative reconstruction, and/or weight based adjustment of the mA/kV was utilized to reduce the radiation dose to as low as reasonably achievable. COMPARISON: CT abdomen/pelvis dated 08/15/2023. CLINICAL HISTORY: Polytrauma, blunt. FINDINGS: CHEST: MEDIASTINUM AND LYMPH NODES: Heart and pericardium are unremarkable. Mild coronary atherosclerosis of the LAD. The central airways are clear. No mediastinal, hilar or axillary lymphadenopathy. Moderate hiatal hernia/inverted intrathoracic stomach. LUNGS AND PLEURA: Mild bilateral lower lobe  atelectasis. Faint peribronchovascular ground-glass opacity in the lungs bilaterally, favoring postinfectious inflammatory scarring. No focal consolidation or pulmonary edema. No pleural effusion. No pneumothorax. ABDOMEN AND PELVIS: LIVER: Unremarkable. GALLBLADDER AND BILE DUCTS: Unremarkable. No biliary ductal dilatation. SPLEEN: Innumerable calcified splenic granulomata, benign. PANCREAS: No acute abnormality. ADRENAL GLANDS: No acute abnormality. KIDNEYS, URETERS AND BLADDER: Small bilateral renal cysts, measuring up to 1.9 cm on the right, benign (Bosniak type 1). Per consensus, no follow-up is needed for simple Bosniak type 1 and 2 renal cysts, unless the patient has a malignancy history or risk factors. No stones in the kidneys or ureters. No hydronephrosis. No perinephric or periureteral stranding. Urinary bladder is unremarkable. GI AND BOWEL: Stomach demonstrates no acute abnormality. Sigmoid diverticulosis, without convincing diverticulitis. There is no bowel obstruction. REPRODUCTIVE ORGANS: Calcified uterine fibroids. PERITONEUM AND RETROPERITONEUM: Peritoneal dialysis catheter enters via the left mid-abdominal wall and terminates in the right pelvis associated moderate pneumoperitoneum, likely iatrogenic in this clinical context. No ascites. VASCULATURE: Aorta is normal in caliber. Mild thoracic aortic atherosclerosis. Atherosclerotic calcifications of the abdominal aorta and branch vessels, although patent. ABDOMINAL AND PELVIS LYMPH NODES: No lymphadenopathy. BONES AND SOFT TISSUES: No acute osseous abnormality. Left hip arthroplasty. Well corticated lesion along the left iliac wing (image 87) at the site of prior lytic lesion, likely related to history of hyperparathyroidism. No focal soft tissue abnormality. IMPRESSION: 1. Moderate pneumoperitoneum, likely iatrogenic in the setting of a peritoneal dialysis catheter. 2. No evidence of acute traumatic injury.  3. Moderate hiatal hernia with inverted  intrathoracic stomach. 4. Additional ancillary findings, as above. Electronically signed by: Pinkie Pebbles MD MD 02/11/2024 11:31 PM EST RP Workstation: [id number]   CT HEAD WO CONTRAST Result Date: 02/11/2024 EXAM: CT HEAD WITHOUT CONTRAST 02/11/2024 11:06:33 PM TECHNIQUE: CT of the head was performed without the administration of intravenous contrast. Automated exposure control, iterative reconstruction, and/or weight based adjustment of the mA/kV was utilized to reduce the radiation dose to as low as reasonably achievable. COMPARISON: 07/13/2016. CLINICAL HISTORY: Head trauma, moderate-severe. FINDINGS: BRAIN AND VENTRICLES: No acute hemorrhage. No evidence of acute infarct. No hydrocephalus. No extra-axial collection. No mass effect or midline shift. Intracranial atherosclerosis. ORBITS: No acute abnormality. SINUSES: No acute abnormality. SOFT TISSUES AND SKULL: No acute soft tissue abnormality. No skull fracture. IMPRESSION: 1. No acute intracranial abnormality. Electronically signed by: Pinkie Pebbles MD MD 02/11/2024 11:17 PM EST RP Workstation: [id number]   CT T-SPINE NO CHARGE Result Date: 02/11/2024 EXAM: CT THORACIC SPINE WITHOUT CONTRAST 02/11/2024 11:06:33 PM TECHNIQUE: CT of the thoracic spine was performed without the administration of intravenous contrast. Multiplanar reformatted images are provided for review. Automated exposure control, iterative reconstruction, and/or weight based adjustment of the mA/kV was utilized to reduce the radiation dose to as low as reasonably achievable. COMPARISON: None available. CLINICAL HISTORY: FINDINGS: BONES AND ALIGNMENT: Normal vertebral body heights. Prominent superior endplate schmorl's node without deformity at T11. No acute fracture or suspicious bone lesion. Normal alignment. DEGENERATIVE CHANGES: No significant degenerative changes. SOFT TISSUES: Evaluated on dedicated CT chest. IMPRESSION: 1. No acute abnormality of the thoracic spine.  Electronically signed by: Pinkie Pebbles MD MD 02/11/2024 11:16 PM EST RP Workstation: [id number]   CT L-SPINE NO CHARGE Result Date: 02/11/2024 EXAM: CT OF THE LUMBAR SPINE WITHOUT CONTRAST 02/11/2024 11:06:33 PM TECHNIQUE: CT of the lumbar spine was performed without the administration of intravenous contrast. Multiplanar reformatted images are provided for review. Automated exposure control, iterative reconstruction, and/or weight based adjustment of the mA/kV was utilized to reduce the radiation dose to as low as reasonably achievable. COMPARISON: None available. CLINICAL HISTORY: FINDINGS: BONES AND ALIGNMENT: Normal vertebral body heights. No acute fracture or suspicious bone lesion. Normal alignment. DEGENERATIVE CHANGES: Mild degenerative changes with scattered Schmorl's node deformities. SOFT TISSUES: No acute abnormality. IMPRESSION: 1. No acute lumbar spine abnormality. Electronically signed by: Pinkie Pebbles MD MD 02/11/2024 11:14 PM EST RP Workstation: [id number]   CT CERVICAL SPINE WO CONTRAST Result Date: 02/11/2024 EXAM: CT CERVICAL SPINE WITHOUT CONTRAST 02/11/2024 11:06:33 PM TECHNIQUE: CT of the cervical spine was performed without the administration of intravenous contrast. Multiplanar reformatted images are provided for review. Automated exposure control, iterative reconstruction, and/or weight based adjustment of the mA/kV was utilized to reduce the radiation dose to as low as reasonably achievable. COMPARISON: None available. CLINICAL HISTORY: Polytrauma, blunt. FINDINGS: LIMITATIONS/ARTIFACTS: Motion degraded images. BONES AND ALIGNMENT: No acute fracture or traumatic malalignment. DEGENERATIVE CHANGES: No significant degenerative changes. SOFT TISSUES: Surgical clips in the neck along the thyroid  gland. No prevertebral soft tissue swelling. IMPRESSION: 1. No acute cervical spine abnormality. Electronically signed by: Pinkie Pebbles MD MD 02/11/2024 11:13 PM EST RP Workstation:  [id number]    ROS: All others negative except those listed in HPI.   Physical Exam: Vitals:   02/12/24 0805 02/12/24 0900 02/12/24 1122 02/12/24 1235  BP:  112/81  129/84  Pulse:  83  87  Resp:  (!) 21  (!) 22  Temp:   97.9 F (36.6 C)   TempSrc:  Oral   SpO2: 100% 100%       General: Awake, alert, NAD Head: NCAT sclera not icteric  Lungs: Diminished at bilateral bases (posteriorly) and clear in uppers. No wheeze, rales or rhonchi. Breathing is unlabored. Heart: RRR. No murmur, rubs or gallops.  Abdomen: soft and non-tender Lower extremities: No b/l LE edema Neuro: AAOx3. Moves all extremities spontaneously.. Dialysis Access: PD cath LUQ  Dialysis Orders:  [facility] - [facility] (Dr. Jerrye) 3 Cycles Cycler fill Avg Dwell 1:30 EDW 75kg Micera 225mcg Q 2 weeks post HD - last dose 12/14/23  Last Labs: Hgb 9.4, K 3.7, Ca 7.4, Alb 2.9  Assessment/Plan: Generalized Weakness - Remains afebrile and WBC is stable. COVID/RSV/Influenza A+B (-). Noted concern for SBP but not showing an infectious picture. ABD soft and non-tender. Will order cell count with body fluid culture for further r/o. On Empiric ABXs. ESRD -  on CCPD. Per patient, no issues with PD at home. Plan to continue PD tonight. She appears dry so will use all 1.5% bags tonight. Hypertension/volume  - Bps soft/stable. Not overloaded on exam. Home Bps meds held which is appropriate. Anemia of CKD - Hgb 9.4. She gets max ESA in outpatient, will resume here. Secondary Hyperparathyroidism -  Checking phos is AM Nutrition - Renal diet  Charmaine Piety, NP [facility] 02/12/2024, 1:48 PM      [1]  Allergies Allergen Reactions   Levofloxacin Other (See Comments)    Causes muscle pain    Erythromycin Nausea And Vomiting   Latex Other (See Comments)    Skin redness   Sulfa Antibiotics Hives   Sulfamethoxazole-Trimethoprim Hives and Itching

## 2024-02-12 NOTE — ED Notes (Signed)
 Message sent to pharmacy for missing dose of zosyn .

## 2024-02-12 NOTE — ED Notes (Signed)
 CCMD called, pt moved to room H013 on CCMD monitors as [place] does not have H003

## 2024-02-12 NOTE — Progress Notes (Signed)
 " PROGRESS NOTE    Colleen Obrien  FMW:[id number] DOB: February 29, 1955 DOA: 02/11/2024 PCP: Claudene Pellet, MD  Subjective: Patient reports feeling weak but otherwise no other acute complaints. Asking when she will resume her dialysis.    Hospital Course: 69 year old female with PMH of ESRD (on peritoneal dialysis), lupus nephritis, GERD, narcolepsy, obstructive sleep apnea, asthma, osteoporosis, secondary hyperparathyroidism, who presented to the ER with complaint of generalized weakness since Thursday.  Her influenza, COVID, RSV were negative, CT abdomen/pelvis showed small bilateral renal cysts, no stones in the kidneys.  There was concern for peritonitis for which she was started on Zosyn .   Assessment and Plan:  Generalized weakness - likely multifactorial, from deconditioning.  SBP is also on the differential, but abdomen is nontender no discharge from the PD catheter site - nephrology following and plan for peritoneal fluid cell count and culture - continue empiric Abx with zosyn  for now  - PT/OT eval   End-stage renal disease: Continue peritoneal dialysis per nephrology, plan to start tonight   Hypotension: Blood pressure a little better with fluids.   Essential hypertension: Holding medications since blood pressure is low.   Obstructive sleep apnea: Consider CPAP at night.   Anemia of chronic disease: H&H is stable.  Continue to monitor, resuming ESA per nephrology   DVT prophylaxis: heparin  injection 5,000 Units Start: 02/12/24 0600    Code Status: Full Code Disposition Plan: Home Reason for continuing need for hospitalization: SBP workup   Objective: Vitals:   02/12/24 0900 02/12/24 1122 02/12/24 1235 02/12/24 1406  BP: 112/81  129/84 105/83  Pulse: 83  87 93  Resp: (!) 21  (!) 22 16  Temp:  97.9 F (36.6 C)  (!) 97.4 F (36.3 C)  TempSrc:  Oral    SpO2: 100%   100%    Intake/Output Summary (Last 24 hours) at 02/12/2024 1529 Last data filed at 02/12/2024  1342 Gross per 24 hour  Intake 600 ml  Output --  Net 600 ml   There were no vitals filed for this visit.  Examination:  Physical Exam  Data Reviewed: I have personally reviewed following labs and imaging studies  CBC: Recent Labs  Lab 02/11/24 2126 02/11/24 2132 02/12/24 0419  WBC 7.4  --  6.8  HGB 10.0* 10.5* 9.4*  HCT 30.1* 31.0* 27.9*  MCV 90.4  --  89.1  PLT 137*  --  131*   Basic Metabolic Panel: Recent Labs  Lab 02/11/24 2126 02/11/24 2132 02/12/24 0419  NA 128* 127* 128*  K 3.9 3.8 3.7  CL 86* 90* 85*  CO2 18*  --  20*  GLUCOSE 107* 104* 84  BUN 77* 74* 76*  CREATININE 16.40* >18.00* 16.70*  CALCIUM  7.1*  --  7.4*   GFR: CrCl cannot be calculated (Unknown ideal weight.). Liver Function Tests: Recent Labs  Lab 02/11/24 2126 02/12/24 0419  AST 18 16  ALT 17 18  ALKPHOS 210* 192*  BILITOT 0.3 0.3  PROT 6.7 6.1*  ALBUMIN  3.1* 2.9*   No results for input(s): LIPASE, AMYLASE in the last 168 hours. No results for input(s): AMMONIA in the last 168 hours. Coagulation Profile: Recent Labs  Lab 02/11/24 2126  INR 1.1   Cardiac Enzymes: No results for input(s): CKTOTAL, CKMB, CKMBINDEX, TROPONINI in the last 168 hours. ProBNP, BNP (last 5 results) No results for input(s): PROBNP, BNP in the last 8760 hours. HbA1C: No results for input(s): HGBA1C in the last 72 hours. CBG: No results for  input(s): GLUCAP in the last 168 hours. Lipid Profile: No results for input(s): CHOL, HDL, LDLCALC, TRIG, CHOLHDL, LDLDIRECT in the last 72 hours. Thyroid  Function Tests: No results for input(s): TSH, T4TOTAL, FREET4, T3FREE, THYROIDAB in the last 72 hours. Anemia Panel: No results for input(s): VITAMINB12, FOLATE, FERRITIN, TIBC, IRON , RETICCTPCT in the last 72 hours. Sepsis Labs: No results for input(s): PROCALCITON, LATICACIDVEN in the last 168 hours.  Recent Results (from the past 240 hours)   Resp panel by RT-PCR (RSV, Flu A&B, Covid) Anterior Nasal Swab     Status: None   Collection Time: 02/11/24 11:26 PM   Specimen: Anterior Nasal Swab  Result Value Ref Range Status   SARS Coronavirus 2 by RT PCR NEGATIVE NEGATIVE Final   Influenza A by PCR NEGATIVE NEGATIVE Final   Influenza B by PCR NEGATIVE NEGATIVE Final    Comment: (NOTE) The Xpert Xpress SARS-CoV-2/FLU/RSV plus assay is intended as an aid in the diagnosis of influenza from Nasopharyngeal swab specimens and should not be used as a sole basis for treatment. Nasal washings and aspirates are unacceptable for Xpert Xpress SARS-CoV-2/FLU/RSV testing.  Fact Sheet for Patients: bloggercourse.com  Fact Sheet for Healthcare Providers: seriousbroker.it  This test is not yet approved or cleared by the [place]  FDA and has been authorized for detection and/or diagnosis of SARS-CoV-2 by FDA under an Emergency Use Authorization (EUA). This EUA will remain in effect (meaning this test can be used) for the duration of the COVID-19 declaration under Section 564(b)(1) of the Act, 21 U.S.C. section 360bbb-3(b)(1), unless the authorization is terminated or revoked.     Resp Syncytial Virus by PCR NEGATIVE NEGATIVE Final    Comment: (NOTE) Fact Sheet for Patients: bloggercourse.com  Fact Sheet for Healthcare Providers: seriousbroker.it  This test is not yet approved or cleared by the [place]  FDA and has been authorized for detection and/or diagnosis of SARS-CoV-2 by FDA under an Emergency Use Authorization (EUA). This EUA will remain in effect (meaning this test can be used) for the duration of the COVID-19 declaration under Section 564(b)(1) of the Act, 21 U.S.C. section 360bbb-3(b)(1), unless the authorization is terminated or revoked.  Performed at [facility], [address], [place],  [place] [address]      Radiology Studies: CT CHEST ABDOMEN PELVIS W CONTRAST Result Date: 02/11/2024 EXAM: CT CHEST, ABDOMEN AND PELVIS WITH CONTRAST 02/11/2024 11:06:33 PM TECHNIQUE: CT of the chest, abdomen and pelvis was performed with the administration of 75 mL iohexol  (OMNIPAQUE ) 350 MG/ML injection. Multiplanar reformatted images are provided for review. Automated exposure control, iterative reconstruction, and/or weight based adjustment of the mA/kV was utilized to reduce the radiation dose to as low as reasonably achievable. COMPARISON: CT abdomen/pelvis dated 08/15/2023. CLINICAL HISTORY: Polytrauma, blunt. FINDINGS: CHEST: MEDIASTINUM AND LYMPH NODES: Heart and pericardium are unremarkable. Mild coronary atherosclerosis of the LAD. The central airways are clear. No mediastinal, hilar or axillary lymphadenopathy. Moderate hiatal hernia/inverted intrathoracic stomach. LUNGS AND PLEURA: Mild bilateral lower lobe atelectasis. Faint peribronchovascular ground-glass opacity in the lungs bilaterally, favoring postinfectious inflammatory scarring. No focal consolidation or pulmonary edema. No pleural effusion. No pneumothorax. ABDOMEN AND PELVIS: LIVER: Unremarkable. GALLBLADDER AND BILE DUCTS: Unremarkable. No biliary ductal dilatation. SPLEEN: Innumerable calcified splenic granulomata, benign. PANCREAS: No acute abnormality. ADRENAL GLANDS: No acute abnormality. KIDNEYS, URETERS AND BLADDER: Small bilateral renal cysts, measuring up to 1.9 cm on the right, benign (Bosniak type 1). Per consensus, no follow-up is needed for simple Bosniak type 1 and  2 renal cysts, unless the patient has a malignancy history or risk factors. No stones in the kidneys or ureters. No hydronephrosis. No perinephric or periureteral stranding. Urinary bladder is unremarkable. GI AND BOWEL: Stomach demonstrates no acute abnormality. Sigmoid diverticulosis, without convincing diverticulitis. There is no bowel obstruction. REPRODUCTIVE  ORGANS: Calcified uterine fibroids. PERITONEUM AND RETROPERITONEUM: Peritoneal dialysis catheter enters via the left mid-abdominal wall and terminates in the right pelvis associated moderate pneumoperitoneum, likely iatrogenic in this clinical context. No ascites. VASCULATURE: Aorta is normal in caliber. Mild thoracic aortic atherosclerosis. Atherosclerotic calcifications of the abdominal aorta and branch vessels, although patent. ABDOMINAL AND PELVIS LYMPH NODES: No lymphadenopathy. BONES AND SOFT TISSUES: No acute osseous abnormality. Left hip arthroplasty. Well corticated lesion along the left iliac wing (image 87) at the site of prior lytic lesion, likely related to history of hyperparathyroidism. No focal soft tissue abnormality. IMPRESSION: 1. Moderate pneumoperitoneum, likely iatrogenic in the setting of a peritoneal dialysis catheter. 2. No evidence of acute traumatic injury. 3. 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 4. Additional ancillary findings, as above. Electronically signed by: Pinkie Pebbles MD MD 02/11/2024 11:31 PM EST RP Workstation: [id number]   CT HEAD WO CONTRAST Result Date: 02/11/2024 EXAM: CT HEAD WITHOUT CONTRAST 02/11/2024 11:06:33 PM TECHNIQUE: CT of the head was performed without the administration of intravenous contrast. Automated exposure control, iterative reconstruction, and/or weight based adjustment of the mA/kV was utilized to reduce the radiation dose to as low as reasonably achievable. COMPARISON: 07/13/2016. CLINICAL HISTORY: Head trauma, moderate-severe. FINDINGS: BRAIN AND VENTRICLES: No acute hemorrhage. No evidence of acute infarct. No hydrocephalus. No extra-axial collection. No mass effect or midline shift. Intracranial atherosclerosis. ORBITS: No acute abnormality. SINUSES: No acute abnormality. SOFT TISSUES AND SKULL: No acute soft tissue abnormality. No skull fracture. IMPRESSION: 1. No acute intracranial abnormality. Electronically signed  by: Pinkie Pebbles MD MD 02/11/2024 11:17 PM EST RP Workstation: [id number]   CT T-SPINE NO CHARGE Result Date: 02/11/2024 EXAM: CT THORACIC SPINE WITHOUT CONTRAST 02/11/2024 11:06:33 PM TECHNIQUE: CT of the thoracic spine was performed without the administration of intravenous contrast. Multiplanar reformatted images are provided for review. Automated exposure control, iterative reconstruction, and/or weight based adjustment of the mA/kV was utilized to reduce the radiation dose to as low as reasonably achievable. COMPARISON: None available. CLINICAL HISTORY: FINDINGS: BONES AND ALIGNMENT: Normal vertebral body heights. Prominent superior endplate schmorl's node without deformity at T11. No acute fracture or suspicious bone lesion. Normal alignment. DEGENERATIVE CHANGES: No significant degenerative changes. SOFT TISSUES: Evaluated on dedicated CT chest. IMPRESSION: 1. No acute abnormality of the thoracic spine. Electronically signed by: Pinkie Pebbles MD MD 02/11/2024 11:16 PM EST RP Workstation: [id number]   CT L-SPINE NO CHARGE Result Date: 02/11/2024 EXAM: CT OF THE LUMBAR SPINE WITHOUT CONTRAST 02/11/2024 11:06:33 PM TECHNIQUE: CT of the lumbar spine was performed without the administration of intravenous contrast. Multiplanar reformatted images are provided for review. Automated exposure control, iterative reconstruction, and/or weight based adjustment of the mA/kV was utilized to reduce the radiation dose to as low as reasonably achievable. COMPARISON: None available. CLINICAL HISTORY: FINDINGS: BONES AND ALIGNMENT: Normal vertebral body heights. No acute fracture or suspicious bone lesion. Normal alignment. DEGENERATIVE CHANGES: Mild degenerative changes with scattered Schmorl's node deformities. SOFT TISSUES: No acute abnormality. IMPRESSION: 1. No acute lumbar spine abnormality. Electronically signed by: Pinkie Pebbles MD MD 02/11/2024 11:14 PM EST RP Workstation: [id number]   CT CERVICAL  SPINE WO CONTRAST Result Date: 02/11/2024 EXAM: CT CERVICAL SPINE WITHOUT CONTRAST  02/11/2024 11:06:33 PM TECHNIQUE: CT of the cervical spine was performed without the administration of intravenous contrast. Multiplanar reformatted images are provided for review. Automated exposure control, iterative reconstruction, and/or weight based adjustment of the mA/kV was utilized to reduce the radiation dose to as low as reasonably achievable. COMPARISON: None available. CLINICAL HISTORY: Polytrauma, blunt. FINDINGS: LIMITATIONS/ARTIFACTS: Motion degraded images. BONES AND ALIGNMENT: No acute fracture or traumatic malalignment. DEGENERATIVE CHANGES: No significant degenerative changes. SOFT TISSUES: Surgical clips in the neck along the thyroid  gland. No prevertebral soft tissue swelling. IMPRESSION: 1. No acute cervical spine abnormality. Electronically signed by: Pinkie Pebbles MD MD 02/11/2024 11:13 PM EST RP Workstation: [id number]    Scheduled Meds:  gentamicin  cream  1 Application Topical Daily   heparin   5,000 Units Subcutaneous Q8H   lidocaine   1 patch Transdermal Q24H   Continuous Infusions:  dialysis solution 1.5% low-MG/low-CA     piperacillin -tazobactam (ZOSYN )  IV       LOS: 1 day   Time spent: 40 minutes  Casimer Dare, MD  [facility]  02/12/2024, 3:29 PM   "

## 2024-02-12 NOTE — Progress Notes (Signed)
 Pt receives out-pt PD care at [facility] home therapy dept. Will assist as needed.   Randine Mungo Dialysis Navigator [phone]

## 2024-02-12 NOTE — ED Notes (Signed)
 CCMD unable to

## 2024-02-12 NOTE — ED Notes (Signed)
 Patient given a snack bag and box of tissue.

## 2024-02-12 NOTE — ED Notes (Signed)
 [place]

## 2024-02-13 LAB — COMPREHENSIVE METABOLIC PANEL WITH GFR
ALT: 16 U/L (ref 0–44)
AST: 15 U/L (ref 15–41)
Albumin: 2.8 g/dL — ABNORMAL LOW (ref 3.5–5.0)
Alkaline Phosphatase: 175 U/L — ABNORMAL HIGH (ref 38–126)
Anion gap: 25 — ABNORMAL HIGH (ref 5–15)
BUN: 76 mg/dL — ABNORMAL HIGH (ref 8–23)
CO2: 17 mmol/L — ABNORMAL LOW (ref 22–32)
Calcium: 7.2 mg/dL — ABNORMAL LOW (ref 8.9–10.3)
Chloride: 85 mmol/L — ABNORMAL LOW (ref 98–111)
Creatinine, Ser: 17.1 mg/dL — ABNORMAL HIGH (ref 0.44–1.00)
GFR, Estimated: 2 mL/min — ABNORMAL LOW
Glucose, Bld: 91 mg/dL (ref 70–99)
Potassium: 3.4 mmol/L — ABNORMAL LOW (ref 3.5–5.1)
Sodium: 127 mmol/L — ABNORMAL LOW (ref 135–145)
Total Bilirubin: 0.3 mg/dL (ref 0.0–1.2)
Total Protein: 6.1 g/dL — ABNORMAL LOW (ref 6.5–8.1)

## 2024-02-13 LAB — C DIFFICILE QUICK SCREEN W PCR REFLEX
C Diff antigen: NEGATIVE
C Diff interpretation: NOT DETECTED
C Diff toxin: NEGATIVE

## 2024-02-13 LAB — BODY FLUID CELL COUNT WITH DIFFERENTIAL
Eos, Fluid: 11 %
Lymphs, Fluid: 10 %
Monocyte-Macrophage-Serous Fluid: 71 % (ref 50–90)
Neutrophil Count, Fluid: 8 % (ref 0–25)
Total Nucleated Cell Count, Fluid: 17 uL (ref 0–1000)

## 2024-02-13 LAB — CBC
HCT: 26.5 % — ABNORMAL LOW (ref 36.0–46.0)
Hemoglobin: 9.1 g/dL — ABNORMAL LOW (ref 12.0–15.0)
MCH: 29.8 pg (ref 26.0–34.0)
MCHC: 34.3 g/dL (ref 30.0–36.0)
MCV: 86.9 fL (ref 80.0–100.0)
Platelets: 128 K/uL — ABNORMAL LOW (ref 150–400)
RBC: 3.05 MIL/uL — ABNORMAL LOW (ref 3.87–5.11)
RDW: 14.1 % (ref 11.5–15.5)
WBC: 6 K/uL (ref 4.0–10.5)
nRBC: 0 % (ref 0.0–0.2)

## 2024-02-13 MED ORDER — SACCHAROMYCES BOULARDII 250 MG PO CAPS
250.0000 mg | ORAL_CAPSULE | Freq: Two times a day (BID) | ORAL | Status: DC
  Administered 2024-02-13 – 2024-02-15 (×5): 250 mg via ORAL
  Filled 2024-02-13 (×5): qty 1

## 2024-02-13 MED ORDER — LOPERAMIDE HCL 2 MG PO CAPS
4.0000 mg | ORAL_CAPSULE | Freq: Once | ORAL | Status: AC
  Administered 2024-02-13: 4 mg via ORAL
  Filled 2024-02-13: qty 2

## 2024-02-13 MED ORDER — GUAIFENESIN ER 600 MG PO TB12
600.0000 mg | ORAL_TABLET | Freq: Two times a day (BID) | ORAL | Status: AC
  Administered 2024-02-13 – 2024-02-15 (×4): 600 mg via ORAL
  Filled 2024-02-13 (×4): qty 1

## 2024-02-13 NOTE — Progress Notes (Signed)
 Peritoneal dialysis treatment initiated, Site care done no Redness or drainage   02/13/24 0200  Cycler Setup  Total Number of Night Cycles 3  Night Fill Volume 1700  Dianeal Solution Dextrose  1.5% in 6000 mL Low Cal/Low Mag  Night Dwell Time per Cycle - Hour(s) 1  Night Dwell Time per Cycle - Minute(s) 30  Night Time Therapy - Minute(s) 48  Night Time Therapy - Hour(s) 5  Minimum Initial Drain Volume 0  Maximum Peritoneal Volume 1700  Night/Total Therapy Volume 5100  Day Exchange No  Completion  Treatment Status Started  Hand-off documentation  Hand-off Received Received from shift RN/LPN  Report received from (Full Name) Bari Odor

## 2024-02-13 NOTE — Procedures (Signed)
 I was present at this peritoneal dialysis session. I have reviewed the session itself and made appropriate changes.   Filed Weights   02/12/24 2002  Weight: 68.9 kg    Recent Labs  Lab 02/13/24 0526  NA 127*  K 3.4*  CL 85*  CO2 17*  GLUCOSE 91  BUN 76*  CREATININE 17.10*  CALCIUM  7.2*    Recent Labs  Lab 02/11/24 2126 02/11/24 2132 02/12/24 0419 02/13/24 0526  WBC 7.4  --  6.8 6.0  HGB 10.0* 10.5* 9.4* 9.1*  HCT 30.1* 31.0* 27.9* 26.5*  MCV 90.4  --  89.1 86.9  PLT 137*  --  131* 128*    Scheduled Meds:  gentamicin  cream  1 Application Topical Daily   heparin   5,000 Units Subcutaneous Q8H   lidocaine   1 patch Transdermal Q24H   Continuous Infusions:  dialysis solution 1.5% low-MG/low-CA Stopped (02/12/24 1640)   piperacillin -tazobactam (ZOSYN )  IV 2.25 g (02/13/24 0432)   PRN Meds:.acetaminophen  **OR** acetaminophen , calcium  carbonate (dosed in mg elemental calcium ), camphor-menthol  **AND** hydrOXYzine , docusate sodium , feeding supplement (NEPRO CARB STEADY), ondansetron  **OR** ondansetron  (ZOFRAN ) IV, sorbitol    Ephriam Stank, MD [facility] 02/13/2024, 8:32 AM

## 2024-02-13 NOTE — Progress Notes (Signed)
 " [facility] Progress Note    Assessment/ Plan:   Generalized Weakness - Remains afebrile and WBC is stable. COVID/RSV/Influenza A+B (-). Noted concern for SBP but not showing an infectious picture. ABD soft and non-tender. cell count with body fluid culture for further r/o--to be collected. On Empiric ABXs. Further w/u per primary service  ESRD -  on CCPD. Per patient, no issues with PD at home. Plan to continue PD tonight. Will use all 1.5% bags  Hypertension/volume  - Bps soft/stable. Not overloaded on exam. Home Bps meds held which is appropriate.  Anemia of CKD - Hgb 9.1. She gets max ESA in outpatient, will resume here.  Secondary Hyperparathyroidism -  Checking phos in AM  Nutrition - Renal diet  OP Dialysis Orders:  [facility] - [facility] (Dr. Jerrye) 3 Cycles Cycler fill Avg Dwell 1:30 EDW 75kg Micera 225mcg Q 2 weeks post HD - last dose 12/14/23  Subjective:   Patient seen and examined on PD. Tolerating treatment, nearing completion of treatment, no complaints.   Objective:   BP 136/71 (BP Location: Right Arm)   Pulse 87   Temp 98.4 F (36.9 C) (Oral)   Resp 17   Ht 5' 2 (1.575 m)   Wt 68.9 kg   SpO2 97%   BMI 27.76 kg/m   Intake/Output Summary (Last 24 hours) at 02/13/2024 9167 Last data filed at 02/13/2024 9660 Gross per 24 hour  Intake 2445 ml  Output --  Net 2445 ml   Weight change:   Physical Exam: Gen: NAD CVS: RRR Resp: unlabored, normal wob Abd: soft, nt/nd Ext: no edema Neuro: awake, alert Dialysis access: PD cath in use  Imaging: CT CHEST ABDOMEN PELVIS W CONTRAST Result Date: 02/11/2024 EXAM: CT CHEST, ABDOMEN AND PELVIS WITH CONTRAST 02/11/2024 11:06:33 PM TECHNIQUE: CT of the chest, abdomen and pelvis was performed with the administration of 75 mL iohexol  (OMNIPAQUE ) 350 MG/ML injection. Multiplanar reformatted images are provided for review. Automated exposure control, iterative reconstruction, and/or weight based  adjustment of the mA/kV was utilized to reduce the radiation dose to as low as reasonably achievable. COMPARISON: CT abdomen/pelvis dated 08/15/2023. CLINICAL HISTORY: Polytrauma, blunt. FINDINGS: CHEST: MEDIASTINUM AND LYMPH NODES: Heart and pericardium are unremarkable. Mild coronary atherosclerosis of the LAD. The central airways are clear. No mediastinal, hilar or axillary lymphadenopathy. Moderate hiatal hernia/inverted intrathoracic stomach. LUNGS AND PLEURA: Mild bilateral lower lobe atelectasis. Faint peribronchovascular ground-glass opacity in the lungs bilaterally, favoring postinfectious inflammatory scarring. No focal consolidation or pulmonary edema. No pleural effusion. No pneumothorax. ABDOMEN AND PELVIS: LIVER: Unremarkable. GALLBLADDER AND BILE DUCTS: Unremarkable. No biliary ductal dilatation. SPLEEN: Innumerable calcified splenic granulomata, benign. PANCREAS: No acute abnormality. ADRENAL GLANDS: No acute abnormality. KIDNEYS, URETERS AND BLADDER: Small bilateral renal cysts, measuring up to 1.9 cm on the right, benign (Bosniak type 1). Per consensus, no follow-up is needed for simple Bosniak type 1 and 2 renal cysts, unless the patient has a malignancy history or risk factors. No stones in the kidneys or ureters. No hydronephrosis. No perinephric or periureteral stranding. Urinary bladder is unremarkable. GI AND BOWEL: Stomach demonstrates no acute abnormality. Sigmoid diverticulosis, without convincing diverticulitis. There is no bowel obstruction. REPRODUCTIVE ORGANS: Calcified uterine fibroids. PERITONEUM AND RETROPERITONEUM: Peritoneal dialysis catheter enters via the left mid-abdominal wall and terminates in the right pelvis associated moderate pneumoperitoneum, likely iatrogenic in this clinical context. No ascites. VASCULATURE: Aorta is normal in caliber. Mild thoracic aortic atherosclerosis. Atherosclerotic calcifications of the abdominal aorta and branch vessels,  although patent.  ABDOMINAL AND PELVIS LYMPH NODES: No lymphadenopathy. BONES AND SOFT TISSUES: No acute osseous abnormality. Left hip arthroplasty. Well corticated lesion along the left iliac wing (image 87) at the site of prior lytic lesion, likely related to history of hyperparathyroidism. No focal soft tissue abnormality. IMPRESSION: 1. Moderate pneumoperitoneum, likely iatrogenic in the setting of a peritoneal dialysis catheter. 2. No evidence of acute traumatic injury. 3. 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 4. Additional ancillary findings, as above. Electronically signed by: Pinkie Pebbles MD MD 02/11/2024 11:31 PM EST RP Workstation: [id number]   CT HEAD WO CONTRAST Result Date: 02/11/2024 EXAM: CT HEAD WITHOUT CONTRAST 02/11/2024 11:06:33 PM TECHNIQUE: CT of the head was performed without the administration of intravenous contrast. Automated exposure control, iterative reconstruction, and/or weight based adjustment of the mA/kV was utilized to reduce the radiation dose to as low as reasonably achievable. COMPARISON: 07/13/2016. CLINICAL HISTORY: Head trauma, moderate-severe. FINDINGS: BRAIN AND VENTRICLES: No acute hemorrhage. No evidence of acute infarct. No hydrocephalus. No extra-axial collection. No mass effect or midline shift. Intracranial atherosclerosis. ORBITS: No acute abnormality. SINUSES: No acute abnormality. SOFT TISSUES AND SKULL: No acute soft tissue abnormality. No skull fracture. IMPRESSION: 1. No acute intracranial abnormality. Electronically signed by: Pinkie Pebbles MD MD 02/11/2024 11:17 PM EST RP Workstation: [id number]   CT T-SPINE NO CHARGE Result Date: 02/11/2024 EXAM: CT THORACIC SPINE WITHOUT CONTRAST 02/11/2024 11:06:33 PM TECHNIQUE: CT of the thoracic spine was performed without the administration of intravenous contrast. Multiplanar reformatted images are provided for review. Automated exposure control, iterative reconstruction, and/or weight based adjustment  of the mA/kV was utilized to reduce the radiation dose to as low as reasonably achievable. COMPARISON: None available. CLINICAL HISTORY: FINDINGS: BONES AND ALIGNMENT: Normal vertebral body heights. Prominent superior endplate schmorl's node without deformity at T11. No acute fracture or suspicious bone lesion. Normal alignment. DEGENERATIVE CHANGES: No significant degenerative changes. SOFT TISSUES: Evaluated on dedicated CT chest. IMPRESSION: 1. No acute abnormality of the thoracic spine. Electronically signed by: Pinkie Pebbles MD MD 02/11/2024 11:16 PM EST RP Workstation: [id number]   CT L-SPINE NO CHARGE Result Date: 02/11/2024 EXAM: CT OF THE LUMBAR SPINE WITHOUT CONTRAST 02/11/2024 11:06:33 PM TECHNIQUE: CT of the lumbar spine was performed without the administration of intravenous contrast. Multiplanar reformatted images are provided for review. Automated exposure control, iterative reconstruction, and/or weight based adjustment of the mA/kV was utilized to reduce the radiation dose to as low as reasonably achievable. COMPARISON: None available. CLINICAL HISTORY: FINDINGS: BONES AND ALIGNMENT: Normal vertebral body heights. No acute fracture or suspicious bone lesion. Normal alignment. DEGENERATIVE CHANGES: Mild degenerative changes with scattered Schmorl's node deformities. SOFT TISSUES: No acute abnormality. IMPRESSION: 1. No acute lumbar spine abnormality. Electronically signed by: Pinkie Pebbles MD MD 02/11/2024 11:14 PM EST RP Workstation: [id number]   CT CERVICAL SPINE WO CONTRAST Result Date: 02/11/2024 EXAM: CT CERVICAL SPINE WITHOUT CONTRAST 02/11/2024 11:06:33 PM TECHNIQUE: CT of the cervical spine was performed without the administration of intravenous contrast. Multiplanar reformatted images are provided for review. Automated exposure control, iterative reconstruction, and/or weight based adjustment of the mA/kV was utilized to reduce the radiation dose to as low as reasonably  achievable. COMPARISON: None available. CLINICAL HISTORY: Polytrauma, blunt. FINDINGS: LIMITATIONS/ARTIFACTS: Motion degraded images. BONES AND ALIGNMENT: No acute fracture or traumatic malalignment. DEGENERATIVE CHANGES: No significant degenerative changes. SOFT TISSUES: Surgical clips in the neck along the thyroid  gland. No prevertebral soft tissue swelling. IMPRESSION: 1. No acute cervical spine abnormality. Electronically signed by:  Pinkie Pebbles MD MD 02/11/2024 11:13 PM EST RP Workstation: [id number]    Labs: BMET Recent Labs  Lab 02/11/24 2126 02/11/24 2132 02/12/24 0419 02/13/24 0526  NA 128* 127* 128* 127*  K 3.9 3.8 3.7 3.4*  CL 86* 90* 85* 85*  CO2 18*  --  20* 17*  GLUCOSE 107* 104* 84 91  BUN 77* 74* 76* 76*  CREATININE 16.40* >18.00* 16.70* 17.10*  CALCIUM  7.1*  --  7.4* 7.2*   CBC Recent Labs  Lab 02/11/24 2126 02/11/24 2132 02/12/24 0419 02/13/24 0526  WBC 7.4  --  6.8 6.0  HGB 10.0* 10.5* 9.4* 9.1*  HCT 30.1* 31.0* 27.9* 26.5*  MCV 90.4  --  89.1 86.9  PLT 137*  --  131* 128*    Medications:     gentamicin  cream  1 Application Topical Daily   heparin   5,000 Units Subcutaneous Q8H   lidocaine   1 patch Transdermal Q24H      Ephriam Stank, MD [facility] 02/13/2024, 8:32 AM   "

## 2024-02-13 NOTE — Plan of Care (Signed)

## 2024-02-13 NOTE — Progress Notes (Addendum)
 " PROGRESS NOTE    Colleen Obrien  FMW:[id number] DOB: 03/10/1955 DOA: 02/11/2024 PCP: Claudene Pellet, MD  Subjective: Patient reports feeling weak still, otherwise no other acute complaints.   Hospital Course: 69 year old female with PMH of ESRD (on peritoneal dialysis), lupus nephritis, GERD, narcolepsy, obstructive sleep apnea, asthma, osteoporosis, secondary hyperparathyroidism, who presented to the ER with complaint of generalized weakness since Thursday.  Her influenza, COVID, RSV were negative, CT abdomen/pelvis showed small bilateral renal cysts, no stones in the kidneys.  There was concern for peritonitis for which she was started on Zosyn .    Assessment and Plan:  Generalized weakness - likely multifactorial, from deconditioning vs SBP is also on the differential, but abdomen is nontender no discharge from the PD catheter site. Imaging including CT chest/abdomen/pelvis with no acute findings, did report moderate pneumoperitoneum but likely iatrogenic in the setting of a peritoneal dialysis catheter - check TSH, B12, VitD levels in the am  - nephrology following and plan for peritoneal fluid cell count and culture, f/u results  - continue empiric Abx with zosyn  for now  - PT/OT eval  - if no concern for peritonitis, can dc Abx and consider nutritional supplement and ongoing PT for strengthening exercises   End-stage renal disease: Continue peritoneal dialysis per nephrology   Hypotension: Blood pressure a little better with fluids. Holding further fluids now    Essential hypertension: Holding medications since blood pressure is low.   Obstructive sleep apnea: Consider CPAP at night.   Anemia of chronic disease: H&H is stable.  Continue to monitor, resuming ESA per nephrology     DVT prophylaxis: heparin  injection 5,000 Units Start: 02/12/24 0600    Code Status: Full Code Family Communication: updated at bedside Disposition Plan: Home Reason for continuing need  for hospitalization: pending peritoneal fluid results   Objective: Vitals:   02/12/24 2016 02/12/24 2345 02/13/24 0344 02/13/24 0839  BP: 113/82 125/85 136/71 118/76  Pulse: 82 92 87 92  Resp: 18 17 17 20   Temp: 98.6 F (37 C) 98.5 F (36.9 C) 98.4 F (36.9 C) 98 F (36.7 C)  TempSrc: Oral Oral Oral   SpO2: 97% 99% 97% 97%  Weight:      Height:        Intake/Output Summary (Last 24 hours) at 02/13/2024 1348 Last data filed at 02/13/2024 1227 Gross per 24 hour  Intake 2631 ml  Output 216 ml  Net 2415 ml   Filed Weights   02/12/24 2002  Weight: 68.9 kg    Examination:  Physical Exam Vitals and nursing note reviewed.  Constitutional:      General: She is not in acute distress. Cardiovascular:     Rate and Rhythm: Normal rate.  Pulmonary:     Effort: No respiratory distress.     Breath sounds: No wheezing.  Abdominal:     General: There is no distension.     Tenderness: There is no abdominal tenderness.     Comments: PD catheter site clean and dry, no discharge      Data Reviewed: I have personally reviewed following labs and imaging studies  CBC: Recent Labs  Lab 02/11/24 2126 02/11/24 2132 02/12/24 0419 02/13/24 0526  WBC 7.4  --  6.8 6.0  HGB 10.0* 10.5* 9.4* 9.1*  HCT 30.1* 31.0* 27.9* 26.5*  MCV 90.4  --  89.1 86.9  PLT 137*  --  131* 128*   Basic Metabolic Panel: Recent Labs  Lab 02/11/24 2126 02/11/24 2132 02/12/24 0419  02/13/24 0526  NA 128* 127* 128* 127*  K 3.9 3.8 3.7 3.4*  CL 86* 90* 85* 85*  CO2 18*  --  20* 17*  GLUCOSE 107* 104* 84 91  BUN 77* 74* 76* 76*  CREATININE 16.40* >18.00* 16.70* 17.10*  CALCIUM  7.1*  --  7.4* 7.2*   GFR: Estimated Creatinine Clearance: 2.9 mL/min (A) (by C-G formula based on SCr of 17.1 mg/dL (H)). Liver Function Tests: Recent Labs  Lab 02/11/24 2126 02/12/24 0419 02/13/24 0526  AST 18 16 15   ALT 17 18 16   ALKPHOS 210* 192* 175*  BILITOT 0.3 0.3 0.3  PROT 6.7 6.1* 6.1*  ALBUMIN  3.1* 2.9*  2.8*   No results for input(s): LIPASE, AMYLASE in the last 168 hours. No results for input(s): AMMONIA in the last 168 hours. Coagulation Profile: Recent Labs  Lab 02/11/24 2126  INR 1.1   Cardiac Enzymes: No results for input(s): CKTOTAL, CKMB, CKMBINDEX, TROPONINI in the last 168 hours. ProBNP, BNP (last 5 results) No results for input(s): PROBNP, BNP in the last 8760 hours. HbA1C: No results for input(s): HGBA1C in the last 72 hours. CBG: No results for input(s): GLUCAP in the last 168 hours. Lipid Profile: No results for input(s): CHOL, HDL, LDLCALC, TRIG, CHOLHDL, LDLDIRECT in the last 72 hours. Thyroid  Function Tests: No results for input(s): TSH, T4TOTAL, FREET4, T3FREE, THYROIDAB in the last 72 hours. Anemia Panel: No results for input(s): VITAMINB12, FOLATE, FERRITIN, TIBC, IRON , RETICCTPCT in the last 72 hours. Sepsis Labs: No results for input(s): PROCALCITON, LATICACIDVEN in the last 168 hours.  Recent Results (from the past 240 hours)  Resp panel by RT-PCR (RSV, Flu A&B, Covid) Anterior Nasal Swab     Status: None   Collection Time: 02/11/24 11:26 PM   Specimen: Anterior Nasal Swab  Result Value Ref Range Status   SARS Coronavirus 2 by RT PCR NEGATIVE NEGATIVE Final   Influenza A by PCR NEGATIVE NEGATIVE Final   Influenza B by PCR NEGATIVE NEGATIVE Final    Comment: (NOTE) The Xpert Xpress SARS-CoV-2/FLU/RSV plus assay is intended as an aid in the diagnosis of influenza from Nasopharyngeal swab specimens and should not be used as a sole basis for treatment. Nasal washings and aspirates are unacceptable for Xpert Xpress SARS-CoV-2/FLU/RSV testing.  Fact Sheet for Patients: bloggercourse.com  Fact Sheet for Healthcare Providers: seriousbroker.it  This test is not yet approved or cleared by the [place]  FDA and has been authorized for detection  and/or diagnosis of SARS-CoV-2 by FDA under an Emergency Use Authorization (EUA). This EUA will remain in effect (meaning this test can be used) for the duration of the COVID-19 declaration under Section 564(b)(1) of the Act, 21 U.S.C. section 360bbb-3(b)(1), unless the authorization is terminated or revoked.     Resp Syncytial Virus by PCR NEGATIVE NEGATIVE Final    Comment: (NOTE) Fact Sheet for Patients: bloggercourse.com  Fact Sheet for Healthcare Providers: seriousbroker.it  This test is not yet approved or cleared by the [place]  FDA and has been authorized for detection and/or diagnosis of SARS-CoV-2 by FDA under an Emergency Use Authorization (EUA). This EUA will remain in effect (meaning this test can be used) for the duration of the COVID-19 declaration under Section 564(b)(1) of the Act, 21 U.S.C. section 360bbb-3(b)(1), unless the authorization is terminated or revoked.  Performed at [facility], [address], [place], [place] [address]      Radiology Studies: CT CHEST ABDOMEN PELVIS W CONTRAST Result Date: 02/11/2024  EXAM: CT CHEST, ABDOMEN AND PELVIS WITH CONTRAST 02/11/2024 11:06:33 PM TECHNIQUE: CT of the chest, abdomen and pelvis was performed with the administration of 75 mL iohexol  (OMNIPAQUE ) 350 MG/ML injection. Multiplanar reformatted images are provided for review. Automated exposure control, iterative reconstruction, and/or weight based adjustment of the mA/kV was utilized to reduce the radiation dose to as low as reasonably achievable. COMPARISON: CT abdomen/pelvis dated 08/15/2023. CLINICAL HISTORY: Polytrauma, blunt. FINDINGS: CHEST: MEDIASTINUM AND LYMPH NODES: Heart and pericardium are unremarkable. Mild coronary atherosclerosis of the LAD. The central airways are clear. No mediastinal, hilar or axillary lymphadenopathy. Moderate hiatal hernia/inverted intrathoracic stomach. LUNGS AND PLEURA:  Mild bilateral lower lobe atelectasis. Faint peribronchovascular ground-glass opacity in the lungs bilaterally, favoring postinfectious inflammatory scarring. No focal consolidation or pulmonary edema. No pleural effusion. No pneumothorax. ABDOMEN AND PELVIS: LIVER: Unremarkable. GALLBLADDER AND BILE DUCTS: Unremarkable. No biliary ductal dilatation. SPLEEN: Innumerable calcified splenic granulomata, benign. PANCREAS: No acute abnormality. ADRENAL GLANDS: No acute abnormality. KIDNEYS, URETERS AND BLADDER: Small bilateral renal cysts, measuring up to 1.9 cm on the right, benign (Bosniak type 1). Per consensus, no follow-up is needed for simple Bosniak type 1 and 2 renal cysts, unless the patient has a malignancy history or risk factors. No stones in the kidneys or ureters. No hydronephrosis. No perinephric or periureteral stranding. Urinary bladder is unremarkable. GI AND BOWEL: Stomach demonstrates no acute abnormality. Sigmoid diverticulosis, without convincing diverticulitis. There is no bowel obstruction. REPRODUCTIVE ORGANS: Calcified uterine fibroids. PERITONEUM AND RETROPERITONEUM: Peritoneal dialysis catheter enters via the left mid-abdominal wall and terminates in the right pelvis associated moderate pneumoperitoneum, likely iatrogenic in this clinical context. No ascites. VASCULATURE: Aorta is normal in caliber. Mild thoracic aortic atherosclerosis. Atherosclerotic calcifications of the abdominal aorta and branch vessels, although patent. ABDOMINAL AND PELVIS LYMPH NODES: No lymphadenopathy. BONES AND SOFT TISSUES: No acute osseous abnormality. Left hip arthroplasty. Well corticated lesion along the left iliac wing (image 87) at the site of prior lytic lesion, likely related to history of hyperparathyroidism. No focal soft tissue abnormality. IMPRESSION: 1. Moderate pneumoperitoneum, likely iatrogenic in the setting of a peritoneal dialysis catheter. 2. No evidence of acute traumatic injury. 3. 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 4. Additional ancillary findings, as above. Electronically signed by: Pinkie Pebbles MD MD 02/11/2024 11:31 PM EST RP Workstation: [id number]   CT HEAD WO CONTRAST Result Date: 02/11/2024 EXAM: CT HEAD WITHOUT CONTRAST 02/11/2024 11:06:33 PM TECHNIQUE: CT of the head was performed without the administration of intravenous contrast. Automated exposure control, iterative reconstruction, and/or weight based adjustment of the mA/kV was utilized to reduce the radiation dose to as low as reasonably achievable. COMPARISON: 07/13/2016. CLINICAL HISTORY: Head trauma, moderate-severe. FINDINGS: BRAIN AND VENTRICLES: No acute hemorrhage. No evidence of acute infarct. No hydrocephalus. No extra-axial collection. No mass effect or midline shift. Intracranial atherosclerosis. ORBITS: No acute abnormality. SINUSES: No acute abnormality. SOFT TISSUES AND SKULL: No acute soft tissue abnormality. No skull fracture. IMPRESSION: 1. No acute intracranial abnormality. Electronically signed by: Pinkie Pebbles MD MD 02/11/2024 11:17 PM EST RP Workstation: [id number]   CT T-SPINE NO CHARGE Result Date: 02/11/2024 EXAM: CT THORACIC SPINE WITHOUT CONTRAST 02/11/2024 11:06:33 PM TECHNIQUE: CT of the thoracic spine was performed without the administration of intravenous contrast. Multiplanar reformatted images are provided for review. Automated exposure control, iterative reconstruction, and/or weight based adjustment of the mA/kV was utilized to reduce the radiation dose to as low as reasonably achievable. COMPARISON: None available. CLINICAL HISTORY: FINDINGS: BONES AND ALIGNMENT: Normal  vertebral body heights. Prominent superior endplate schmorl's node without deformity at T11. No acute fracture or suspicious bone lesion. Normal alignment. DEGENERATIVE CHANGES: No significant degenerative changes. SOFT TISSUES: Evaluated on dedicated CT chest. IMPRESSION: 1. No acute abnormality of  the thoracic spine. Electronically signed by: Pinkie Pebbles MD MD 02/11/2024 11:16 PM EST RP Workstation: [id number]   CT L-SPINE NO CHARGE Result Date: 02/11/2024 EXAM: CT OF THE LUMBAR SPINE WITHOUT CONTRAST 02/11/2024 11:06:33 PM TECHNIQUE: CT of the lumbar spine was performed without the administration of intravenous contrast. Multiplanar reformatted images are provided for review. Automated exposure control, iterative reconstruction, and/or weight based adjustment of the mA/kV was utilized to reduce the radiation dose to as low as reasonably achievable. COMPARISON: None available. CLINICAL HISTORY: FINDINGS: BONES AND ALIGNMENT: Normal vertebral body heights. No acute fracture or suspicious bone lesion. Normal alignment. DEGENERATIVE CHANGES: Mild degenerative changes with scattered Schmorl's node deformities. SOFT TISSUES: No acute abnormality. IMPRESSION: 1. No acute lumbar spine abnormality. Electronically signed by: Pinkie Pebbles MD MD 02/11/2024 11:14 PM EST RP Workstation: [id number]   CT CERVICAL SPINE WO CONTRAST Result Date: 02/11/2024 EXAM: CT CERVICAL SPINE WITHOUT CONTRAST 02/11/2024 11:06:33 PM TECHNIQUE: CT of the cervical spine was performed without the administration of intravenous contrast. Multiplanar reformatted images are provided for review. Automated exposure control, iterative reconstruction, and/or weight based adjustment of the mA/kV was utilized to reduce the radiation dose to as low as reasonably achievable. COMPARISON: None available. CLINICAL HISTORY: Polytrauma, blunt. FINDINGS: LIMITATIONS/ARTIFACTS: Motion degraded images. BONES AND ALIGNMENT: No acute fracture or traumatic malalignment. DEGENERATIVE CHANGES: No significant degenerative changes. SOFT TISSUES: Surgical clips in the neck along the thyroid  gland. No prevertebral soft tissue swelling. IMPRESSION: 1. No acute cervical spine abnormality. Electronically signed by: Pinkie Pebbles MD MD 02/11/2024 11:13 PM  EST RP Workstation: [id number]    Scheduled Meds:  gentamicin  cream  1 Application Topical Daily   heparin   5,000 Units Subcutaneous Q8H   lidocaine   1 patch Transdermal Q24H   saccharomyces boulardii  250 mg Oral BID   Continuous Infusions:  dialysis solution 1.5% low-MG/low-CA Stopped (02/12/24 1640)   piperacillin -tazobactam (ZOSYN )  IV 2.25 g (02/13/24 0953)     LOS: 2 days   Time spent: 35 minutes  Casimer Dare, MD  [facility]  02/13/2024, 1:48 PM   "

## 2024-02-13 NOTE — Progress Notes (Signed)
 PD treatment initiated without issue.  02/13/24 1904  Peritoneal Catheter Left lower abdomen Continuous ambulatory  Placement Date/Time: 10/06/23 9171   Procedural Verification: Medical records & consent reviewed;Site marked with initials;Relevant studies,results and images reviewed  Time out: Correct Patient;Correct Procedure;Correct Site;Special equipment/require...  Site Assessment Clean, Dry, Intact  Drainage Description None  Catheter status Accessed  Dressing Gauze/Drain sponge;Occlusive  Dressing Status Clean, Dry, Intact  Dressing Intervention Assessed, no intervention needed  Cycler Setup  Total Number of Night Cycles 3  Night Fill Volume 1700  Dianeal Solution Dextrose  1.5% in 6000 mL Low Cal/Low Mag  Night Dwell Time per Cycle - Hour(s) 1  Night Dwell Time per Cycle - Minute(s) 30  Night Time Therapy - Minute(s) 48  Night Time Therapy - Hour(s) 5  Minimum Initial Drain Volume 0  Maximum Peritoneal Volume 1700  Night/Total Therapy Volume 5100  Day Exchange No  Completion  Treatment Status Started  Education / Care Plan  Dialysis Education Provided Yes  Hand-off documentation  Hand-off Received Received from shift RN/LPN  Report received from (Full Name) Armida Nest RN

## 2024-02-14 DIAGNOSIS — N186 End stage renal disease: Secondary | ICD-10-CM | POA: Diagnosis not present

## 2024-02-14 DIAGNOSIS — G4733 Obstructive sleep apnea (adult) (pediatric): Secondary | ICD-10-CM | POA: Diagnosis not present

## 2024-02-14 DIAGNOSIS — I10 Essential (primary) hypertension: Secondary | ICD-10-CM | POA: Diagnosis not present

## 2024-02-14 DIAGNOSIS — D631 Anemia in chronic kidney disease: Secondary | ICD-10-CM | POA: Diagnosis not present

## 2024-02-14 DIAGNOSIS — R531 Weakness: Secondary | ICD-10-CM | POA: Diagnosis not present

## 2024-02-14 LAB — PATHOLOGIST SMEAR REVIEW

## 2024-02-14 LAB — RENAL FUNCTION PANEL
Albumin: 3 g/dL — ABNORMAL LOW (ref 3.5–5.0)
Anion gap: 24 — ABNORMAL HIGH (ref 5–15)
BUN: 72 mg/dL — ABNORMAL HIGH (ref 8–23)
CO2: 19 mmol/L — ABNORMAL LOW (ref 22–32)
Calcium: 7.4 mg/dL — ABNORMAL LOW (ref 8.9–10.3)
Chloride: 85 mmol/L — ABNORMAL LOW (ref 98–111)
Creatinine, Ser: 16.6 mg/dL — ABNORMAL HIGH (ref 0.44–1.00)
GFR, Estimated: 2 mL/min — ABNORMAL LOW
Glucose, Bld: 92 mg/dL (ref 70–99)
Phosphorus: 9.3 mg/dL — ABNORMAL HIGH (ref 2.5–4.6)
Potassium: 3.6 mmol/L (ref 3.5–5.1)
Sodium: 128 mmol/L — ABNORMAL LOW (ref 135–145)

## 2024-02-14 LAB — CALCIUM, IONIZED: Calcium, Ionized, Serum: 3.3 mg/dL — ABNORMAL LOW (ref 4.5–5.6)

## 2024-02-14 LAB — VITAMIN B12: Vitamin B-12: 1181 pg/mL — ABNORMAL HIGH (ref 180–914)

## 2024-02-14 LAB — TSH: TSH: 7.21 u[IU]/mL — ABNORMAL HIGH (ref 0.350–4.500)

## 2024-02-14 LAB — VITAMIN D 25 HYDROXY (VIT D DEFICIENCY, FRACTURES): Vit D, 25-Hydroxy: 15.8 ng/mL — ABNORMAL LOW (ref 30–100)

## 2024-02-14 MED ORDER — DELFLEX-LC/2.5% DEXTROSE 394 MOSM/L IP SOLN: INTRAPERITONEAL | Status: DC

## 2024-02-14 MED ORDER — FERRIC CITRATE 1 GM 210 MG(FE) PO TABS
630.0000 mg | ORAL_TABLET | Freq: Three times a day (TID) | ORAL | Status: DC
  Administered 2024-02-14 – 2024-02-15 (×3): 630 mg via ORAL
  Filled 2024-02-14 (×3): qty 3

## 2024-02-14 MED ORDER — GUAIFENESIN ER 600 MG PO TB12: 600.0000 mg | ORAL_TABLET | Freq: Two times a day (BID) | ORAL | 0 refills | Status: AC

## 2024-02-14 MED ORDER — SACCHAROMYCES BOULARDII 250 MG PO CAPS: 250.0000 mg | ORAL_CAPSULE | Freq: Two times a day (BID) | ORAL | 0 refills | Status: AC

## 2024-02-14 MED ORDER — DELFLEX-LC/1.5% DEXTROSE 344 MOSM/L IP SOLN: INTRAPERITONEAL | Status: DC

## 2024-02-14 NOTE — Progress Notes (Signed)
" °  [facility] Progress Note    Assessment/ Plan:   Generalized Weakness - Remains afebrile and WBC is stable. COVID/RSV/Influenza A+B (-). Noted concern for SBP but not showing an infectious picture. ABD soft and non-tender. PD fluid culture reassuring thus far. On Empiric ABXs which I think we can discontinue. Further w/u per primary service  ESRD -  on CCPD. Per patient, no issues with PD at home. Plan to continue PD tonight. Will use half 1.5% bags and half 2.5% bags  Hypertension/volume  - Bps soft/stable. Not overloaded on exam. Agree with holding bp meds  Anemia of CKD - Hgb 9.1. She gets max ESA in outpatient, will resume here.  Secondary Hyperparathyroidism -  phos high, resuming auryxia   Hyponatremia- UF as tolerated with HD, stable at 128, will utilize half 2.5% bags tonight  Nutrition - Renal diet  OP Dialysis Orders:  [facility] - [facility] (Dr. Jerrye) 3 Cycles Cycler fill Avg Dwell 1:30 EDW 75kg Micera 225mcg Q 2 weeks post HD - last dose 12/14/23  Subjective:   Patient seen and examined bedside. Sister at bedside. Tolerated PD overnight. Reports chronic cough and runny nose. Has seen ENT for this No other complaints   Objective:   BP 122/81 (BP Location: Right Arm)   Pulse 98   Temp 98.2 F (36.8 C)   Resp 15   Ht 5' 2 (1.575 m)   Wt 68.9 kg   SpO2 95%   BMI 27.76 kg/m   Intake/Output Summary (Last 24 hours) at 02/14/2024 0920 Last data filed at 02/14/2024 0345 Gross per 24 hour  Intake --  Output 614 ml  Net -614 ml   Weight change: 0 kg  Physical Exam: Gen: NAD CVS: RRR Resp: unlabored, normal wob, cta bl Abd: soft, nt/nd Ext: no edema Neuro: awake, alert Dialysis access: PD cath  Imaging: No results found.   Labs: BMET Recent Labs  Lab 02/11/24 2126 02/11/24 2132 02/12/24 0419 02/13/24 0526 02/14/24 0251  NA 128* 127* 128* 127* 128*  K 3.9 3.8 3.7 3.4* 3.6  CL 86* 90* 85* 85* 85*  CO2 18*  --  20* 17* 19*   GLUCOSE 107* 104* 84 91 92  BUN 77* 74* 76* 76* 72*  CREATININE 16.40* >18.00* 16.70* 17.10* 16.60*  CALCIUM  7.1*  --  7.4* 7.2* 7.4*  PHOS  --   --   --   --  9.3*   CBC Recent Labs  Lab 02/11/24 2126 02/11/24 2132 02/12/24 0419 02/13/24 0526  WBC 7.4  --  6.8 6.0  HGB 10.0* 10.5* 9.4* 9.1*  HCT 30.1* 31.0* 27.9* 26.5*  MCV 90.4  --  89.1 86.9  PLT 137*  --  131* 128*    Medications:     gentamicin  cream  1 Application Topical Daily   guaiFENesin   600 mg Oral BID   heparin   5,000 Units Subcutaneous Q8H   lidocaine   1 patch Transdermal Q24H   saccharomyces boulardii  250 mg Oral BID      Ephriam Stank, MD [facility] 02/14/2024, 9:20 AM   "

## 2024-02-14 NOTE — Discharge Instructions (Signed)
 Repeat TSH in 4 weeks at PCP office

## 2024-02-14 NOTE — Evaluation (Signed)
 Physical Therapy Evaluation Patient Details Name: Colleen Obrien MRN: [id number] DOB: Jun 06, 1955 Today's Date: 02/14/2024  History of Present Illness  69 y.o. female presents to [facility] hospital on 02/11/2024 with generalized weakness. PMH: CKD, arthritis, asthma, blood dyscrasia, Dyspnea, GERD, Gout, HTN, Lupus, narcolepsy, pneumonia, sleep apnea.  Clinical Impression  Pt presents to PT with acute on chronic deficits in strength, power, gait, balance, ROM, endurance. Pt reports chronic RLE and elbow pain with increased weakness in recent weeks. Pt demonstrates tolerance for short bouts of household ambulation at this time and requires physical assistance to negotiate stairs. PT provides HEP for UE strengthening and for functional LE exercise. PT recommends discharge home with HHPT and a tub transfer bench.        If plan is discharge home, recommend the following: A little help with walking and/or transfers;A little help with bathing/dressing/bathroom;Assistance with cooking/housework;Assist for transportation;Help with stairs or ramp for entrance   Can travel by private vehicle        Equipment Recommendations Other (comment) (tub transfer bench)  Recommendations for Other Services       Functional Status Assessment Patient has had a recent decline in their functional status and demonstrates the ability to make significant improvements in function in a reasonable and predictable amount of time.     Precautions / Restrictions Precautions Precautions: Fall Recall of Precautions/Restrictions: Intact Restrictions Weight Bearing Restrictions Per Provider Order: No      Mobility  Bed Mobility Overal bed mobility: Needs Assistance Bed Mobility: Supine to Sit, Sit to Supine     Supine to sit: Supervision Sit to supine: Min assist        Transfers Overall transfer level: Needs assistance Equipment used: Rollator (4 wheels) Transfers: Sit to/from Stand Sit to Stand:  Contact guard assist, Min assist           General transfer comment: minA with fatigue    Ambulation/Gait Ambulation/Gait assistance: Contact guard assist Gait Distance (Feet): 40 Feet (additional trial of 10') Assistive device: Rollator (4 wheels) Gait Pattern/deviations: Step-to pattern Gait velocity: reduced Gait velocity interpretation: <1.31 ft/sec, indicative of household ambulator   General Gait Details: slowed step-to gait, reduced stance time on RLE and reduced clearance of LLE  Stairs Stairs: Yes Stairs assistance: Min assist Stair Management: No rails, With walker, Backwards Number of Stairs: 2 General stair comments: PT supports rollator as pt ascends step backward, minA for balance. Pt reports this is how she typically negotiates steps at home with assistance of spouse. Steps at home are shorter per pt report  Wheelchair Mobility     Tilt Bed    Modified Rankin (Stroke Patients Only)       Balance Overall balance assessment: Needs assistance Sitting-balance support: No upper extremity supported, Feet supported Sitting balance-Leahy Scale: Good     Standing balance support: Bilateral upper extremity supported, Reliant on assistive device for balance Standing balance-Leahy Scale: Poor                               Pertinent Vitals/Pain Pain Assessment Pain Assessment: Faces Faces Pain Scale: Hurts even more Pain Location: R knee and upper back Pain Descriptors / Indicators: Aching Pain Intervention(s): Monitored during session    Home Living Family/patient expects to be discharged to:: Private residence Living Arrangements: Spouse/significant other Available Help at Discharge: Family;Available 24 hours/day Type of Home: House Home Access: Stairs to enter Entrance Stairs-Rails: None Entrance Stairs-Number of  Steps: 3 (or 4 steps with bilateral rails)   Home Layout: Able to live on main level with bedroom/bathroom Home Equipment:  Rollator (4 wheels);Cane - single point;BSC/3in1      Prior Function Prior Level of Function : Needs assist             Mobility Comments: ambulatory for household distances with rollator vs SPC. Assistance from spouse for stair negotiation ADLs Comments: assistance for IADLs and transportation     Extremity/Trunk Assessment   Upper Extremity Assessment Upper Extremity Assessment: RUE deficits/detail RUE Deficits / Details: chronic R shoulder flexion ROM deficits, able to actively flex to ~100 degrees today, elbow/wrist/digit ROM WFL    Lower Extremity Assessment Lower Extremity Assessment: Generalized weakness (valgus of R knee with bowing of RLE from tibia fx in 2024.)    Cervical / Trunk Assessment Cervical / Trunk Assessment: Normal  Communication   Communication Communication: No apparent difficulties    Cognition Arousal: Alert Behavior During Therapy: WFL for tasks assessed/performed   PT - Cognitive impairments: No apparent impairments                         Following commands: Intact       Cueing Cueing Techniques: Verbal cues     General Comments General comments (skin integrity, edema, etc.): VSS on RA    Exercises Other Exercises Other Exercises: anterior and posterior shoulder rolls along with scapular retraction Other Exercises: theraband exercise handout provided for UE strengthening Other Exercises: repeated sit to stand exercise   Assessment/Plan    PT Assessment Patient needs continued PT services  PT Problem List Decreased strength;Decreased activity tolerance;Decreased balance;Decreased range of motion;Decreased mobility;Decreased knowledge of use of DME;Pain       PT Treatment Interventions DME instruction;Gait training;Stair training;Functional mobility training;Therapeutic activities;Balance training;Therapeutic exercise;Neuromuscular re-education;Patient/family education    PT Goals (Current goals can be found in the  Care Plan section)  Acute Rehab PT Goals Patient Stated Goal: to improve strength and activity tolerance to qualify for kidney transplant PT Goal Formulation: With patient Time For Goal Achievement: 02/28/24 Potential to Achieve Goals: Fair    Frequency Min 2X/week     Co-evaluation               AM-PAC PT 6 Clicks Mobility  Outcome Measure Help needed turning from your back to your side while in a flat bed without using bedrails?: A Little Help needed moving from lying on your back to sitting on the side of a flat bed without using bedrails?: A Little Help needed moving to and from a bed to a chair (including a wheelchair)?: A Little Help needed standing up from a chair using your arms (e.g., wheelchair or bedside chair)?: A Little Help needed to walk in hospital room?: A Little Help needed climbing 3-5 steps with a railing? : A Little 6 Click Score: 18    End of Session Equipment Utilized During Treatment: Gait belt Activity Tolerance: Patient tolerated treatment well Patient left: in bed;with call bell/phone within reach;with bed alarm set Nurse Communication: Mobility status PT Visit Diagnosis: Other abnormalities of gait and mobility (R26.89);Muscle weakness (generalized) (M62.81)    Time: 8791-8691 PT Time Calculation (min) (ACUTE ONLY): 60 min   Charges:   PT Evaluation $PT Eval Low Complexity: 1 Low PT Treatments $Gait Training: 8-22 mins $Therapeutic Exercise: 8-22 mins PT General Charges $$ ACUTE PT VISIT: 1 Visit         Franciso Dierks  JINNY Ruth, PT, DPT Acute Rehabilitation Office [phone]   Bernardino JINNY Ruth 02/14/2024, 1:52 PM

## 2024-02-14 NOTE — Progress Notes (Signed)
 Physical Therapy Quick Note  PT has completed initial evaluation.    Overall, patient at min assistance level.   PT Follow up recommended: Home Health PT Equipment recommended:  Other, Tub transfer bench Complete evaluation note to follow.     Bernardino JINNY Ruth, PT, DPT Acute Rehabilitation Office [phone]

## 2024-02-14 NOTE — Care Management Important Message (Signed)
 Important Message  Patient Details  Name: Colleen Obrien MRN: [id number] Date of Birth: 1955-10-25   Important Message Given:  Yes - Medicare IM     [name] 02/14/2024, 2:52 PM

## 2024-02-14 NOTE — Progress Notes (Signed)
 Transition of Care [facility]) - Inpatient Brief Assessment   Patient Details  Name: Colleen Obrien MRN: [id number] Date of Birth: [birth year]  Transition of Care [facility]) CM/SW Contact:    Rosaline JONELLE Joe, RN Phone Number: 02/14/2024, 1:59 PM   Clinical Narrative: Patient admitted to the hospital with generalized weakness.  Patient has home peritoneal dialysis.  The patient lives with her spouse and plans to go home when stable.  Patient was seen by PT and tub transfer bench was recommended.  Medicare does not cover the cost of the bench.  I asked that patient's spouse look into ordering one from [employer].  Spouse is present in the hospital room.  Patient was offered Medicare choice regarding home health and patient states that she had [facility] home [facility] in the past and was agreeable to re-start services.  HH orders for PT/OT was ordered  - to be co-signed by MD.  I called Hedda CHEADLE and Darleene, CM with [facility] and he accepted the patient for services.  No other IP Care management needs.  No other needs prior to patient returning to home.   Transition of Care Asessment: Insurance and Status: (P) Insurance coverage has been reviewed Patient has primary care physician: (P) Yes Home environment has been reviewed: (P) from home with spouse Prior level of function:: (P) family assistance Prior/Current Home Services: (P) No current home services Social Drivers of Health Review: (P) SDOH reviewed interventions complete Readmission risk has been reviewed: (P) Yes Transition of care needs: (P) transition of care needs identified, TOC will continue to follow

## 2024-02-14 NOTE — Discharge Summary (Addendum)
 " Physician Discharge Summary   Patient: Colleen Obrien MRN: [id number] DOB: [birth year]  Admit date:     02/11/2024  Discharge date: 02/15/24  Discharge Physician: Colleen Obrien   PCP: Colleen Pellet, MD   Recommendations at discharge:   Follow-up PCP in 1 week  Discharge Diagnoses: Principal Problem:   General weakness Active Problems:   Obstructive sleep apnea   Anemia of chronic renal failure   Hypertension   ESRD (end stage renal disease) (HCC)  Resolved Problems:   * No resolved hospital problems. *  Hospital Course: 69 year old female with PMH of ESRD (on peritoneal dialysis), lupus nephritis, GERD, narcolepsy, obstructive sleep apnea, asthma, osteoporosis, secondary hyperparathyroidism, who presented to the ER with complaint of generalized weakness since Thursday. Her influenza, COVID, RSV were negative, CT abdomen/pelvis showed small bilateral renal cysts, no stones in the kidneys. There was concern for peritonitis for which she was started on Zosyn .   Assessment and Plan:  Generalized weakness - likely multifactorial, from deconditioning vs SBP is also on the differential, but abdomen is nontender no discharge from the PD catheter site. Imaging including CT chest/abdomen/pelvis with no acute findings, did report moderate pneumoperitoneum but likely iatrogenic in the setting of a peritoneal dialysis catheter - TSH 7.210, she will need repeat TSH in 4 weeks - Vitamin D  level is 15 - nephrology following and plan for peritoneal fluid cell count and culture, f/u results.  Culture results are negative to date -Empirically started on IV antibiotics, which have been discontinued - if no concern for peritonitis, can dc Abx and consider nutritional supplement and ongoing PT for strengthening exercises   End-stage renal disease: Continue peritoneal dialysis per nephrology   Hypotension: Blood pressure a little better with fluids.  Will discontinue labetalol , Lasix ,  felodipine  at this time.   Obstructive sleep apnea: Consider CPAP at night.   Anemia of chronic disease: H&H is stable.    Back pian- Lidocaine  5% patch ordered       Consultants: Nephrology Procedures performed:  Disposition: Home Diet recommendation:  Regular diet DISCHARGE MEDICATION: Allergies as of 02/15/2024       Reactions   Levofloxacin Other (See Comments)   Causes muscle pain    Erythromycin Nausea And Vomiting   Latex Other (See Comments)   Skin redness   Sulfa Antibiotics Hives   Sulfamethoxazole-trimethoprim Hives, Itching        Medication List     STOP taking these medications    felodipine  5 MG 24 hr tablet Commonly known as: PLENDIL    furosemide  80 MG tablet Commonly known as: LASIX    labetalol  100 MG tablet Commonly known as: NORMODYNE    lidocaine  4 % Replaced by: lidocaine  5 %       TAKE these medications    acetaminophen  500 MG tablet Commonly known as: TYLENOL  Take 500 mg by mouth every 6 (six) hours as needed (for pain.).   albuterol  108 (90 Base) MCG/ACT inhaler Commonly known as: VENTOLIN  HFA Inhale 2 puffs into the lungs every 6 (six) hours as needed for wheezing or shortness of breath.   allopurinol  100 MG tablet Commonly known as: ZYLOPRIM  Take 100 mg by mouth daily.   Arnicare Arthritis Crea Apply 1 Application topically daily as needed (arthritis pain).   Auryxia  1 GM 210 MG(Fe) tablet Generic drug: ferric citrate  Take 420 mg by mouth 3 (three) times daily with meals.   budesonide -formoterol  80-4.5 MCG/ACT inhaler Commonly known as: SYMBICORT Inhale 2 puffs into the lungs in  the morning and at bedtime.   calcitRIOL  0.5 MCG capsule Commonly known as: ROCALTROL  Take 0.5 mcg by mouth daily.   carboxymethylcellulose 0.5 % Soln Commonly known as: REFRESH PLUS Place 1 drop into both eyes 3 (three) times daily as needed (dry eye/eye irritation).   cinacalcet 30 MG tablet Commonly known as: SENSIPAR Take 30 mg  by mouth daily.   colchicine  0.6 MG tablet Take 0.6 mg by mouth daily.   Dialyvite /Zinc  Tabs Take 1 tablet by mouth in the morning.   Enulose 10 GM/15ML Soln Generic drug: lactulose (encephalopathy) Take 20 g by mouth every 4 (four) hours as needed.   guaiFENesin  600 MG 12 hr tablet Commonly known as: MUCINEX  Take 1 tablet (600 mg total) by mouth 2 (two) times daily for 3 days.   hydroxychloroquine  200 MG tablet Commonly known as: PLAQUENIL  Take 200 mg by mouth in the morning.   lidocaine  5 % Commonly known as: Lidoderm  Place 1 patch onto the skin daily for 10 days. Remove & Discard patch within 12 hours or as directed by MD Replaces: lidocaine  4 %   ondansetron  4 MG tablet Commonly known as: ZOFRAN  Take 4 mg by mouth every 8 (eight) hours as needed for nausea or vomiting.   pantoprazole  40 MG tablet Commonly known as: PROTONIX  Take 40 mg by mouth 2 (two) times daily.   saccharomyces boulardii 250 MG capsule Commonly known as: FLORASTOR Take 1 capsule (250 mg total) by mouth 2 (two) times daily.   triamcinolone  cream 0.1 % Commonly known as: KENALOG Apply 1 Application topically 2 (two) times daily as needed (skin irritation.).        Contact information for follow-up providers     Colleen Pellet, MD Follow up in 1 week(s).   Specialty: Family Medicine Contact information: [phone] [address] [place] [address] [phone]              Contact information for after-discharge care     Home Medical Care     [facility]) .   Service: Home Health Services Contact information: [address] [place]  [address] [phone]                    Discharge Exam: Colleen Obrien   02/12/24 2002 02/14/24 0500 02/15/24 0500  Weight: 68.9 kg 68.9 kg 68.9 kg   General-appears in no acute distress Heart-S1-S2, regular, no murmur auscultated Lungs-clear to auscultation bilaterally, no  wheezing or crackles auscultated Abdomen-soft, nontender, no organomegaly Extremities-no edema in the lower extremities Neuro-alert, oriented x3, no focal deficit noted  Condition at discharge: good  The results of significant diagnostics from this hospitalization (including imaging, microbiology, ancillary and laboratory) are listed below for reference.   Imaging Studies: CT CHEST ABDOMEN PELVIS W CONTRAST Result Date: 02/11/2024 EXAM: CT CHEST, ABDOMEN AND PELVIS WITH CONTRAST 02/11/2024 11:06:33 PM TECHNIQUE: CT of the chest, abdomen and pelvis was performed with the administration of 75 mL iohexol  (OMNIPAQUE ) 350 MG/ML injection. Multiplanar reformatted images are provided for review. Automated exposure control, iterative reconstruction, and/or weight based adjustment of the mA/kV was utilized to reduce the radiation dose to as low as reasonably achievable. COMPARISON: CT abdomen/pelvis dated 08/15/2023. CLINICAL HISTORY: Polytrauma, blunt. FINDINGS: CHEST: MEDIASTINUM AND LYMPH NODES: Heart and pericardium are unremarkable. Mild coronary atherosclerosis of the LAD. The central airways are clear. No mediastinal, hilar or axillary lymphadenopathy. Moderate hiatal hernia/inverted intrathoracic stomach. LUNGS AND PLEURA: Mild bilateral  lower lobe atelectasis. Faint peribronchovascular ground-glass opacity in the lungs bilaterally, favoring postinfectious inflammatory scarring. No focal consolidation or pulmonary edema. No pleural effusion. No pneumothorax. ABDOMEN AND PELVIS: LIVER: Unremarkable. GALLBLADDER AND BILE DUCTS: Unremarkable. No biliary ductal dilatation. SPLEEN: Innumerable calcified splenic granulomata, benign. PANCREAS: No acute abnormality. ADRENAL GLANDS: No acute abnormality. KIDNEYS, URETERS AND BLADDER: Small bilateral renal cysts, measuring up to 1.9 cm on the right, benign (Bosniak type 1). Per consensus, no follow-up is needed for simple Bosniak type 1 and 2 renal cysts, unless the  patient has a malignancy history or risk factors. No stones in the kidneys or ureters. No hydronephrosis. No perinephric or periureteral stranding. Urinary bladder is unremarkable. GI AND BOWEL: Stomach demonstrates no acute abnormality. Sigmoid diverticulosis, without convincing diverticulitis. There is no bowel obstruction. REPRODUCTIVE ORGANS: Calcified uterine fibroids. PERITONEUM AND RETROPERITONEUM: Peritoneal dialysis catheter enters via the left mid-abdominal wall and terminates in the right pelvis associated moderate pneumoperitoneum, likely iatrogenic in this clinical context. No ascites. VASCULATURE: Aorta is normal in caliber. Mild thoracic aortic atherosclerosis. Atherosclerotic calcifications of the abdominal aorta and branch vessels, although patent. ABDOMINAL AND PELVIS LYMPH NODES: No lymphadenopathy. BONES AND SOFT TISSUES: No acute osseous abnormality. Left hip arthroplasty. Well corticated lesion along the left iliac wing (image 87) at the site of prior lytic lesion, likely related to history of hyperparathyroidism. No focal soft tissue abnormality. IMPRESSION: 1. Moderate pneumoperitoneum, likely iatrogenic in the setting of a peritoneal dialysis catheter. 2. No evidence of acute traumatic injury. 3. 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 4. Additional ancillary findings, as above. Electronically signed by: Pinkie Pebbles MD MD 02/11/2024 11:31 PM EST RP Workstation: [id number]   CT HEAD WO CONTRAST Result Date: 02/11/2024 EXAM: CT HEAD WITHOUT CONTRAST 02/11/2024 11:06:33 PM TECHNIQUE: CT of the head was performed without the administration of intravenous contrast. Automated exposure control, iterative reconstruction, and/or weight based adjustment of the mA/kV was utilized to reduce the radiation dose to as low as reasonably achievable. COMPARISON: 07/13/2016. CLINICAL HISTORY: Head trauma, moderate-severe. FINDINGS: BRAIN AND VENTRICLES: No acute hemorrhage. No  evidence of acute infarct. No hydrocephalus. No extra-axial collection. No mass effect or midline shift. Intracranial atherosclerosis. ORBITS: No acute abnormality. SINUSES: No acute abnormality. SOFT TISSUES AND SKULL: No acute soft tissue abnormality. No skull fracture. IMPRESSION: 1. No acute intracranial abnormality. Electronically signed by: Pinkie Pebbles MD MD 02/11/2024 11:17 PM EST RP Workstation: [id number]   CT T-SPINE NO CHARGE Result Date: 02/11/2024 EXAM: CT THORACIC SPINE WITHOUT CONTRAST 02/11/2024 11:06:33 PM TECHNIQUE: CT of the thoracic spine was performed without the administration of intravenous contrast. Multiplanar reformatted images are provided for review. Automated exposure control, iterative reconstruction, and/or weight based adjustment of the mA/kV was utilized to reduce the radiation dose to as low as reasonably achievable. COMPARISON: None available. CLINICAL HISTORY: FINDINGS: BONES AND ALIGNMENT: Normal vertebral body heights. Prominent superior endplate schmorl's node without deformity at T11. No acute fracture or suspicious bone lesion. Normal alignment. DEGENERATIVE CHANGES: No significant degenerative changes. SOFT TISSUES: Evaluated on dedicated CT chest. IMPRESSION: 1. No acute abnormality of the thoracic spine. Electronically signed by: Pinkie Pebbles MD MD 02/11/2024 11:16 PM EST RP Workstation: [id number]   CT L-SPINE NO CHARGE Result Date: 02/11/2024 EXAM: CT OF THE LUMBAR SPINE WITHOUT CONTRAST 02/11/2024 11:06:33 PM TECHNIQUE: CT of the lumbar spine was performed without the administration of intravenous contrast. Multiplanar reformatted images are provided for review. Automated exposure control, iterative reconstruction, and/or weight based adjustment of the mA/kV was  utilized to reduce the radiation dose to as low as reasonably achievable. COMPARISON: None available. CLINICAL HISTORY: FINDINGS: BONES AND ALIGNMENT: Normal vertebral body heights. No acute  fracture or suspicious bone lesion. Normal alignment. DEGENERATIVE CHANGES: Mild degenerative changes with scattered Schmorl's node deformities. SOFT TISSUES: No acute abnormality. IMPRESSION: 1. No acute lumbar spine abnormality. Electronically signed by: Pinkie Pebbles MD MD 02/11/2024 11:14 PM EST RP Workstation: [id number]   CT CERVICAL SPINE WO CONTRAST Result Date: 02/11/2024 EXAM: CT CERVICAL SPINE WITHOUT CONTRAST 02/11/2024 11:06:33 PM TECHNIQUE: CT of the cervical spine was performed without the administration of intravenous contrast. Multiplanar reformatted images are provided for review. Automated exposure control, iterative reconstruction, and/or weight based adjustment of the mA/kV was utilized to reduce the radiation dose to as low as reasonably achievable. COMPARISON: None available. CLINICAL HISTORY: Polytrauma, blunt. FINDINGS: LIMITATIONS/ARTIFACTS: Motion degraded images. BONES AND ALIGNMENT: No acute fracture or traumatic malalignment. DEGENERATIVE CHANGES: No significant degenerative changes. SOFT TISSUES: Surgical clips in the neck along the thyroid  gland. No prevertebral soft tissue swelling. IMPRESSION: 1. No acute cervical spine abnormality. Electronically signed by: Pinkie Pebbles MD MD 02/11/2024 11:13 PM EST RP Workstation: [id number]   XR HIP UNILAT W OR W/O PELVIS 2-3 VIEWS LEFT Result Date: 01/16/2024 X-rays of the pelvis and the left hip show a left total hip arthroplasty with ingrown components.  There is slight degenerative spurring around the greater trochanter.   Microbiology: Results for orders placed or performed during the hospital encounter of 02/11/24  Resp panel by RT-PCR (RSV, Flu A&B, Covid) Anterior Nasal Swab     Status: None   Collection Time: 02/11/24 11:26 PM   Specimen: Anterior Nasal Swab  Result Value Ref Range Status   SARS Coronavirus 2 by RT PCR NEGATIVE NEGATIVE Final   Influenza A by PCR NEGATIVE NEGATIVE Final   Influenza B by PCR  NEGATIVE NEGATIVE Final    Comment: (NOTE) The Xpert Xpress SARS-CoV-2/FLU/RSV plus assay is intended as an aid in the diagnosis of influenza from Nasopharyngeal swab specimens and should not be used as a sole basis for treatment. Nasal washings and aspirates are unacceptable for Xpert Xpress SARS-CoV-2/FLU/RSV testing.  Fact Sheet for Patients: bloggercourse.com  Fact Sheet for Healthcare Providers: seriousbroker.it  This test is not yet approved or cleared by the [place]  FDA and has been authorized for detection and/or diagnosis of SARS-CoV-2 by FDA under an Emergency Use Authorization (EUA). This EUA will remain in effect (meaning this test can be used) for the duration of the COVID-19 declaration under Section 564(b)(1) of the Act, 21 U.S.C. section 360bbb-3(b)(1), unless the authorization is terminated or revoked.     Resp Syncytial Virus by PCR NEGATIVE NEGATIVE Final    Comment: (NOTE) Fact Sheet for Patients: bloggercourse.com  Fact Sheet for Healthcare Providers: seriousbroker.it  This test is not yet approved or cleared by the [place]  FDA and has been authorized for detection and/or diagnosis of SARS-CoV-2 by FDA under an Emergency Use Authorization (EUA). This EUA will remain in effect (meaning this test can be used) for the duration of the COVID-19 declaration under Section 564(b)(1) of the Act, 21 U.S.C. section 360bbb-3(b)(1), unless the authorization is terminated or revoked.  Performed at [facility], [address] [place], [place] [address]   Body fluid culture w Gram Stain     Status: None (Preliminary result)   Collection Time: 02/13/24  5:00 AM   Specimen: Peritoneal Washings; Body Fluid  Result Value Ref Range Status  Specimen Description PERITONEAL  Final   Special Requests Normal  Final   Gram Stain NO WBC SEEN NO ORGANISMS  SEEN   Final   Culture   Final    NO GROWTH < 24 HOURS Performed at [facility], [address], [place], [place] [address]    Report Status PENDING  Incomplete  C Difficile Quick Screen w PCR reflex     Status: None   Collection Time: 02/13/24  2:09 PM   Specimen: STOOL  Result Value Ref Range Status   C Diff antigen NEGATIVE NEGATIVE Final   C Diff toxin NEGATIVE NEGATIVE Final   C Diff interpretation No C. difficile detected.  Final    Comment: Performed at [facility], [address], [place], [place] [address]    Labs: CBC: Recent Labs  Lab 02/11/24 2126 02/11/24 2132 02/12/24 0419 02/13/24 0526  WBC 7.4  --  6.8 6.0  HGB 10.0* 10.5* 9.4* 9.1*  HCT 30.1* 31.0* 27.9* 26.5*  MCV 90.4  --  89.1 86.9  PLT 137*  --  131* 128*   Basic Metabolic Panel: Recent Labs  Lab 02/11/24 2126 02/11/24 2132 02/12/24 0419 02/13/24 0526 02/14/24 0251  NA 128* 127* 128* 127* 128*  K 3.9 3.8 3.7 3.4* 3.6  CL 86* 90* 85* 85* 85*  CO2 18*  --  20* 17* 19*  GLUCOSE 107* 104* 84 91 92  BUN 77* 74* 76* 76* 72*  CREATININE 16.40* >18.00* 16.70* 17.10* 16.60*  CALCIUM  7.1*  --  7.4* 7.2* 7.4*  PHOS  --   --   --   --  9.3*   Liver Function Tests: Recent Labs  Lab 02/11/24 2126 02/12/24 0419 02/13/24 0526 02/14/24 0251  AST 18 16 15   --   ALT 17 18 16   --   ALKPHOS 210* 192* 175*  --   BILITOT 0.3 0.3 0.3  --   PROT 6.7 6.1* 6.1*  --   ALBUMIN  3.1* 2.9* 2.8* 3.0*   CBG: No results for input(s): GLUCAP in the last 168 hours.  Discharge time spent: greater than 30 minutes.  Signed: Sabas GORMAN Brod, MD [facility] 02/15/2024 "

## 2024-02-14 NOTE — Plan of Care (Signed)
  Problem: Education: Goal: Knowledge of General Education information will improve Description: Including pain rating scale, medication(s)/side effects and non-pharmacologic comfort measures Outcome: Progressing   Problem: Health Behavior/Discharge Planning: Goal: Ability to manage health-related needs will improve Outcome: Progressing   Problem: Clinical Measurements: Goal: Ability to maintain clinical measurements within normal limits will improve Outcome: Progressing Goal: Will remain free from infection Outcome: Progressing Goal: Diagnostic test results will improve Outcome: Progressing   Problem: Activity: Goal: Risk for activity intolerance will decrease Outcome: Progressing   Problem: Nutrition: Goal: Adequate nutrition will be maintained Outcome: Progressing   Problem: Coping: Goal: Level of anxiety will decrease Outcome: Progressing   Problem: Pain Managment: Goal: General experience of comfort will improve and/or be controlled Outcome: Progressing

## 2024-02-15 ENCOUNTER — Other Ambulatory Visit (HOSPITAL_COMMUNITY): Payer: Self-pay

## 2024-02-15 MED ORDER — LIDOCAINE 5 % EX PTCH
1.0000 | MEDICATED_PATCH | CUTANEOUS | 0 refills | Status: AC
  Filled 2024-02-15: qty 10, 10d supply, fill #0

## 2024-02-15 NOTE — Plan of Care (Signed)

## 2024-02-15 NOTE — Progress Notes (Signed)
" °  [facility] Progress Note    Assessment/ Plan:   Generalized Weakness - Remains afebrile and WBC is stable. COVID/RSV/Influenza A+B (-). Noted concern for SBP (not sure where this thought came from as she has been completely asymptomatic and has not been having issues with PD). Cell count reassuring, PD culture neg thus far therefore, discontinuing Abx. Further w/u for weakness per primary service  ESRD -  on CCPD. Per patient, no issues with PD at home. Plan to continue PD tonight. Will use half 1.5% bags and half 2.5% bags  Hypertension/volume  - Bps soft/stable. Not overloaded on exam. Agree with holding bp meds  Anemia of CKD - Hgb 9.1. She gets max ESA in outpatient, will resume here.  Secondary Hyperparathyroidism -  phos high, resumed auryxia   Hyponatremia- UF as tolerated with HD, stable at 128, will utilize half 2.5% bags tonight  Nutrition - Renal diet  OP Dialysis Orders:  [facility] - [facility] (Dr. Jerrye) 3 Cycles Cycler fill Avg Dwell 1:30 EDW 75kg Micera 225mcg Q 2 weeks post HD - last dose 12/14/23  Subjective:   Patient seen and examined bedside. No complaints, ready to go home.   Objective:   BP 136/82 (BP Location: Right Arm)   Pulse 100   Temp 97.7 F (36.5 C)   Resp 14   Ht 5' 2 (1.575 m)   Wt 68.9 kg   SpO2 96%   BMI 27.76 kg/m   Intake/Output Summary (Last 24 hours) at 02/15/2024 0810 Last data filed at 02/15/2024 0245 Gross per 24 hour  Intake --  Output 381 ml  Net -381 ml   Weight change: 0 kg  Physical Exam: Gen: NAD, laying flat in bed CVS: RRR Resp: unlabored, normal wob, cta bl Abd: soft, nt/nd Ext: no edema Neuro: awake, alert Dialysis access: PD cath  Imaging: No results found.   Labs: BMET Recent Labs  Lab 02/11/24 2126 02/11/24 2132 02/12/24 0419 02/13/24 0526 02/14/24 0251  NA 128* 127* 128* 127* 128*  K 3.9 3.8 3.7 3.4* 3.6  CL 86* 90* 85* 85* 85*  CO2 18*  --  20* 17* 19*  GLUCOSE 107* 104*  84 91 92  BUN 77* 74* 76* 76* 72*  CREATININE 16.40* >18.00* 16.70* 17.10* 16.60*  CALCIUM  7.1*  --  7.4* 7.2* 7.4*  PHOS  --   --   --   --  9.3*   CBC Recent Labs  Lab 02/11/24 2126 02/11/24 2132 02/12/24 0419 02/13/24 0526  WBC 7.4  --  6.8 6.0  HGB 10.0* 10.5* 9.4* 9.1*  HCT 30.1* 31.0* 27.9* 26.5*  MCV 90.4  --  89.1 86.9  PLT 137*  --  131* 128*    Medications:     ferric citrate   630 mg Oral TID WC   gentamicin  cream  1 Application Topical Daily   guaiFENesin   600 mg Oral BID   heparin   5,000 Units Subcutaneous Q8H   lidocaine   1 patch Transdermal Q24H   saccharomyces boulardii  250 mg Oral BID      Ephriam Stank, MD [facility] 02/15/2024, 8:10 AM   "

## 2024-02-15 NOTE — Progress Notes (Signed)
 D/C order noted. Contacted [facility] home therapy dept to be advised of pt's d/c today and that pt should resume home PD at d/c.   Randine Mungo Dialysis Navigator [phone]

## 2024-02-16 LAB — BODY FLUID CULTURE W GRAM STAIN
Culture: NO GROWTH
Gram Stain: NONE SEEN
Special Requests: NORMAL
